# Patient Record
Sex: Male | Born: 1950 | Race: Black or African American | Hispanic: No | Smoking: Never smoker
Health system: Southern US, Community
[De-identification: ages and names within clinical notes are randomized; demographics above are authoritative.]

## PROBLEM LIST (undated history)

## (undated) DIAGNOSIS — D649 Anemia, unspecified: Secondary | ICD-10-CM

## (undated) DIAGNOSIS — I10 Essential (primary) hypertension: Secondary | ICD-10-CM

## (undated) DIAGNOSIS — E119 Type 2 diabetes mellitus without complications: Secondary | ICD-10-CM

## (undated) DIAGNOSIS — N189 Chronic kidney disease, unspecified: Secondary | ICD-10-CM

## (undated) HISTORY — PX: AV FISTULA PLACEMENT: SHX1204

## (undated) HISTORY — PX: CYSTECTOMY: SUR359

---

## 2009-08-01 ENCOUNTER — Ambulatory Visit (HOSPITAL_COMMUNITY): Admission: RE | Admit: 2009-08-01 | Discharge: 2009-08-01 | Payer: Self-pay | Admitting: Vascular Surgery

## 2009-08-01 ENCOUNTER — Ambulatory Visit: Payer: Self-pay | Admitting: Vascular Surgery

## 2009-09-13 ENCOUNTER — Ambulatory Visit: Payer: Self-pay | Admitting: Vascular Surgery

## 2009-09-22 ENCOUNTER — Ambulatory Visit (HOSPITAL_COMMUNITY): Admission: RE | Admit: 2009-09-22 | Discharge: 2009-09-22 | Payer: Self-pay | Admitting: Vascular Surgery

## 2009-09-22 ENCOUNTER — Ambulatory Visit: Payer: Self-pay | Admitting: Vascular Surgery

## 2009-11-22 ENCOUNTER — Ambulatory Visit: Payer: Self-pay | Admitting: Vascular Surgery

## 2009-11-29 ENCOUNTER — Ambulatory Visit (HOSPITAL_COMMUNITY): Admission: RE | Admit: 2009-11-29 | Discharge: 2009-11-29 | Payer: Self-pay | Admitting: Vascular Surgery

## 2009-11-29 ENCOUNTER — Ambulatory Visit: Payer: Self-pay | Admitting: Vascular Surgery

## 2010-01-24 ENCOUNTER — Ambulatory Visit (HOSPITAL_COMMUNITY): Admission: RE | Admit: 2010-01-24 | Discharge: 2010-01-24 | Payer: Self-pay | Admitting: Nephrology

## 2010-06-16 LAB — POCT I-STAT 4, (NA,K, GLUC, HGB,HCT)
Glucose, Bld: 136 mg/dL — ABNORMAL HIGH (ref 70–99)
HCT: 36 % — ABNORMAL LOW (ref 39.0–52.0)
Hemoglobin: 12.2 g/dL — ABNORMAL LOW (ref 13.0–17.0)
Potassium: 3.4 mEq/L — ABNORMAL LOW (ref 3.5–5.1)
Sodium: 140 mEq/L (ref 135–145)

## 2010-06-18 LAB — SURGICAL PCR SCREEN
MRSA, PCR: NEGATIVE
Staphylococcus aureus: NEGATIVE

## 2010-06-18 LAB — GLUCOSE, CAPILLARY: Glucose-Capillary: 81 mg/dL (ref 70–99)

## 2010-06-20 LAB — GLUCOSE, CAPILLARY
Glucose-Capillary: 76 mg/dL (ref 70–99)
Glucose-Capillary: 77 mg/dL (ref 70–99)
Glucose-Capillary: 83 mg/dL (ref 70–99)
Glucose-Capillary: 86 mg/dL (ref 70–99)

## 2010-08-15 NOTE — Assessment & Plan Note (Signed)
OFFICE VISIT   Thomas Ruiz, Thomas Ruiz  DOB:  October 12, 1950                                       11/22/2009  [id number]   The patient returns today for followup of his left upper arm AV fistula  created by Dr. Oneida Alar on June 23 for end-stage renal disease.  The  fistula has an excellent pulse and palpable thrill with a well-healed  antecubital incision.  There are no symptoms of steal distally.   On examination, the vein is dilating nicely and is palpable up to the  shoulder level.  There is a very prominent side branch about 2-3 cm  proximal to the antecubital incision, extending laterally down the  forearm.  By compressing this the pulsation in the fistula definitely  increases, as does the audible flow.  I think this branch should be  ligated to maximize the chance of maturity and so the fistula can be  utilized toward the end of September.  We scheduled this side branch  ligation to be done by Dr. Oneida Alar on Tuesday, August 30, under local  anesthesia.     Nelda Severe Kellie Simmering, M.D.  Electronically Signed   JDL/MEDQ  D:  11/22/2009  T:  11/23/2009  Job:  [id number]

## 2010-08-15 NOTE — Consult Note (Signed)
NEW PATIENT CONSULTATION   Thomas Ruiz, Thomas Ruiz  DOB:  1950-11-27                                       09/13/2009  [name]   The patient is a 60 year old male patient with chronic renal  insufficiency currently on hemodialysis on Monday, Wednesday and Friday  who presents for vascular access.  He is right-handed.  Thomas. Lowanda Foster is  his cardiologist.  He had a dialysis catheter inserted by Thomas. Donnetta Hutching on  May 2 of this year.   CHRONIC MEDICAL PROBLEMS:  1. Type 1 diabetes mellitus for 30 years.  2. Hypertension.  3. Hyperlipidemia.  4. Chronic renal insufficiency on hemodialysis.  5. Secondary hyperparathyroidism.  6. Negative for coronary artery disease, COPD or stroke.   FAMILY HISTORY:  Positive for diabetes in a brother.  Negative for  coronary artery disease and stroke.   SOCIAL HISTORY:  He is single and retired.  He does not use tobacco or  alcohol.   REVIEW OF SYSTEMS:  Positive for anorexia, weight loss, chronic kidney  disease.  Denies any cardiac, pulmonary, GI or neurologic symptoms.  No  musculoskeletal symptoms.  Also other systems in review of systems were  negative.   PHYSICAL EXAMINATION:  Blood pressure 162/85, heart rate 71, temperature  97.9.  Generally he is a well-developed, well-nourished male in no  apparent distress, alert and oriented times 3.  HEENT exam normal.  EOMs  intact.  Lungs clear to auscultation.  No rhonchi or wheezing.  Cardiovascular exam is a regular rhythm.  No murmurs.  Carotid pulses  3+, no bruits audible.  Abdomen soft, nontender with no masses.  Musculoskeletal exam is free of major deformities.  Neurologic exam is  normal.  There is no numbness or tingling.  Skin is free of rashes.  Upper extremity exam reveals 3+ brachial and radial pulses bilaterally.  Cephalic veins are not large but do seem adequate in the left upper arm.   Today I ordered bilateral vein mapping which I reviewed and interpreted.  The  best vein is the left upper arm cephalic vein which varies between  0.47 and 0.25.   I have scheduled him for left upper arm AV fistula on Thursday June 23  as an outpatient.  Hopefully this will provide a satisfactory site for  vascular access for this nice man.     Thomas Ruiz, M.D.  Electronically Signed   JDL/MEDQ  D:  09/13/2009  T:  09/14/2009  Job:  [id number]   cc:   Thomas Ruiz, M.D.  Thomas Ruiz

## 2010-08-15 NOTE — Procedures (Signed)
CEPHALIC VEIN MAPPING   INDICATION:  AV fistula placement.   HISTORY:  Kidney disease.   EXAM:  The right cephalic vein and basilic veins are compressible.   Diameter measurements range from 0.32 to [id number] in cephalic vein and A999333  to [id number] in basilic vein.   The left cephalic vein and basilic veins are compressible.   Diameter measurements range from 0.47 to 0000000 in cephalic vein and A999333  to [id number] in basilic vein.   See attached worksheet for all measurements.   IMPRESSION:  Patent right and left cephalic and basilic veins with  diameters noted above.   ___________________________________________  Nelda Severe. Kellie Simmering, M.D.   NT/MEDQ  D:  09/13/2009  T:  09/13/2009  Job:  [id number]

## 2011-02-08 ENCOUNTER — Other Ambulatory Visit (HOSPITAL_COMMUNITY): Payer: Self-pay | Admitting: Nephrology

## 2011-02-08 DIAGNOSIS — N186 End stage renal disease: Secondary | ICD-10-CM

## 2011-02-16 ENCOUNTER — Other Ambulatory Visit (HOSPITAL_COMMUNITY): Payer: Self-pay | Admitting: Nephrology

## 2011-02-16 ENCOUNTER — Ambulatory Visit: Payer: Self-pay | Admitting: Vascular Surgery

## 2011-02-16 DIAGNOSIS — N186 End stage renal disease: Secondary | ICD-10-CM

## 2011-02-19 ENCOUNTER — Ambulatory Visit (HOSPITAL_COMMUNITY): Admission: RE | Admit: 2011-02-19 | Payer: Self-pay | Source: Ambulatory Visit

## 2011-02-27 ENCOUNTER — Ambulatory Visit (HOSPITAL_COMMUNITY): Payer: Self-pay

## 2011-07-24 ENCOUNTER — Ambulatory Visit: Payer: Self-pay | Admitting: Vascular Surgery

## 2011-07-24 LAB — BASIC METABOLIC PANEL
Calcium, Total: 9.5 mg/dL (ref 8.5–10.1)
Creatinine: 8.89 mg/dL — ABNORMAL HIGH (ref 0.60–1.30)
EGFR (African American): 7 — ABNORMAL LOW
Glucose: 165 mg/dL — ABNORMAL HIGH (ref 65–99)
Osmolality: 287 (ref 275–301)
Potassium: 4 mmol/L (ref 3.5–5.1)
Sodium: 136 mmol/L (ref 136–145)

## 2014-07-25 NOTE — Op Note (Signed)
PATIENT NAME:  Thomas Ruiz, Thomas Ruiz MR#:  [id number] DATE OF BIRTH:  18-Sep-1950  DATE OF PROCEDURE:  07/24/2011  PREOPERATIVE DIAGNOSIS:  1. Poorly functioning dialysis access.  2. End-stage renal disease requiring hemodialysis.   POSTOPERATIVE DIAGNOSIS:  1. Poorly functioning dialysis access.  2. End-stage renal disease requiring hemodialysis.   PROCEDURE PERFORMED:  1. Brachiocephalic fistulogram.  2. Percutaneous transluminal angioplasty and stent placement left brachiocephalic fistula secondary to failed angioplasty.   SURGEON: Katha Cabal, MD   SEDATION: Versed 3 mg plus fentanyl 100 mcg administered IV. Continuous ECG, pulse oximetry and cardiopulmonary monitoring was performed throughout the entire procedure by the Interventional Radiology nurse. Total sedation time is 45 minutes.   ACCESS: A 7-French sheath, antegrade direction, left arm brachiocephalic fistula.   CONTRAST USED: Isovue 30 mL.   FLUOROSCOPY TIME:  3.6 minutes.   INDICATIONS: Thomas Ruiz is a 64 year old gentleman maintained on hemodialysis via a left arm brachiocephalic fistula who has been having increasing problems with bleeding post decannulation as well as worsening KT/v and poor dialysis runs. Physical examination as well as noninvasive studies demonstrated a more proximal stricture, and he is undergoing angiography to evaluate this with the intention for treatment. The risks and benefits were reviewed. All questions are answered. The patient agrees to proceed.   DESCRIPTION OF PROCEDURE: The patient is taken to Special Procedures and placed in the supine position with his left arm extended palm upward. The left arm is prepped and draped in a sterile fashion. After adequate sedation has been achieved, 1% lidocaine is infiltrated in the soft tissues overlying the fistula near the arterial anastomosis; and in an antegrade direction, a micropuncture needle is inserted, a microwire followed by micro sheath, J-wire  followed by a 6-French  sheath is inserted. Hand injection of contrast is then utilized to demonstrate the fistula, and a long segment approximately 6 cm string sign is identified within the cephalic vein just proximal to the venous cannulation site.   Heparin 3000 units is given, and a Magic Torque Wire is advanced through the stenosis, and a 7 x 4 Conquest balloon is advanced across the lesion. Two separate inflations are required to completely cross this lesion; both inflations are to 24 atmospheres. Although the balloon expanded to its profile, follow-up angiography demonstrated only minimal luminal gain. This represented a complete failure of stand-alone angioplasty, and therefore a 9 x 60 Fluency stent is opened onto the field. The sheath is removed, and over the wire the Fluency stent is advanced.  It is then opened across this lesion and postdilated using an 8 x 6 Rival balloon. Follow-up angiography demonstrated a focal area with greater than 30% residual stenosis and a small area at the more proximal margin of the stent which still appeared undersized as well. Therefore, a 9 x 2 Conquest balloon is utilized to 34 atmospheres for one minute. Follow-up angiography demonstrates complete resolution of the stenotic area and now a better profile to the proximal margin of the stent. With the 9 x 2 balloon inflated, reflux of contrast was noted in the arterial anastomosis, and visualized portions of brachial artery are widely patent. More central runoff, including the more proximal cephalic vein, the cephalic vein confluence, the subclavian vein and central veins are all imaged at this time as well. A pursestring suture of 4-0 Monocryl is placed. The sheath is removed, and there are no immediate complications.   INTERPRETATION: Initial views of the fistula itself demonstrate a long segment string sign just proximal  to the cannulation site. This is inadequately treated with stand-alone angioplasty, and  therefore a 9 x 6 Fluency stent is deployed across this lesion and postdilated to 9 mm. Follow-up angiography demonstrates complete resolution of the stenotic area. Reflux images demonstrate patency of the arterial portion, and the confluence of the cephalic vein as well as the central veins are all widely patent.   SUMMARY: Successful salvage of left arm brachiocephalic fistula with placement of a Fluency stent.  ____________________________ Katha Cabal, MD ggs:cbb D: 07/24/2011 17:08:00 ET T: 07/24/2011 18:09:00 ET JOB#: [id number] Thomas Lory SCHNIER MD ELECTRONICALLY SIGNED 08/10/2011 7:51

## 2014-08-24 ENCOUNTER — Other Ambulatory Visit: Payer: Self-pay | Admitting: Podiatry

## 2014-08-24 ENCOUNTER — Encounter (HOSPITAL_COMMUNITY): Payer: Self-pay

## 2014-08-24 ENCOUNTER — Ambulatory Visit (HOSPITAL_COMMUNITY)
Admission: RE | Admit: 2014-08-24 | Discharge: 2014-08-24 | Disposition: A | Discharge status: Home / Self Care | Payer: Medicare Other | Source: Ambulatory Visit | Attending: Podiatry | Admitting: Podiatry

## 2014-08-24 ENCOUNTER — Encounter (HOSPITAL_COMMUNITY)
Admission: RE | Admit: 2014-08-24 | Discharge: 2014-08-24 | Disposition: A | Discharge status: Home / Self Care | Payer: Medicare Other | Source: Ambulatory Visit | Attending: Podiatry | Admitting: Podiatry

## 2014-08-24 DIAGNOSIS — M2011 Hallux valgus (acquired), right foot: Secondary | ICD-10-CM | POA: Diagnosis not present

## 2014-08-24 DIAGNOSIS — I70208 Unspecified atherosclerosis of native arteries of extremities, other extremity: Secondary | ICD-10-CM | POA: Diagnosis not present

## 2014-08-24 DIAGNOSIS — Z01818 Encounter for other preprocedural examination: Secondary | ICD-10-CM | POA: Diagnosis present

## 2014-08-24 DIAGNOSIS — Z419 Encounter for procedure for purposes other than remedying health state, unspecified: Secondary | ICD-10-CM

## 2014-08-24 HISTORY — DX: Anemia, unspecified: D64.9

## 2014-08-24 HISTORY — DX: Chronic kidney disease, unspecified: N18.9

## 2014-08-24 HISTORY — DX: Essential (primary) hypertension: I10

## 2014-08-24 HISTORY — DX: Type 2 diabetes mellitus without complications: E11.9

## 2014-08-24 NOTE — Pre-Procedure Instructions (Signed)
Patient given information to sign up for my chart at home. 

## 2014-08-24 NOTE — Patient Instructions (Addendum)
Your procedure is scheduled on: 08/26/2014  Report to [facility] at   11:40  AM.  Call this number if you have problems the morning of surgery: [phone]   Remember:   Do not drink or eat food:After Midnight.  :  Take these medicines the morning of surgery with A SIP OF WATER: blood pressure pill. Take 1/2 your usual insulin dosage the night before your surgery and nothing for diabetes the morning of surgery.   Do not wear jewelry, make-up or nail polish.  Do not wear lotions, powders, or perfumes.   Do not shave 48 hours prior to surgery. Men may shave face and neck.  Do not bring valuables to the hospital.  Contacts, dentures or bridgework may not be worn into surgery.  Leave suitcase in the car. After surgery it may be brought to your room.  For patients admitted to the hospital, checkout time is 11:00 AM the day of discharge.   Patients discharged the day of surgery will not be allowed to drive home.    Special Instructions: Shower using CHG night before surgery and shower the day of surgery use CHG.  Use special wash - you have one bottle of CHG for all showers.  You should use approximately 1/2 of the bottle for each shower.   Please read over the following fact sheets that you were given: Pain Booklet, MRSA Information, Surgical Site Infection Prevention and Care and Recovery After Surgery  Toe Injuries and Amputations You have cut off (amputated) part of your toe. Your outcome depends largely on how much was amputated. If just the tip is amputated, often the end of the toe will grow back and the toe may return much to the same as it was before the injury. If more of the toe is missing, your caregiver has done the best with the tissue remaining to allow you to keep as much toe as is possible or has finished the amputation at a level that will leave you with the most functional toe. This means a toe that will work the best for you. Please read the instructions outlined below and  refer to this sheet in the next few weeks. These instructions provide you with general information on caring for yourself. Your caregiver may also give you specific instructions. While your treatment has been done according to the most current medical practices available, unavoidable complications occasionally occur. If you have any problems or questions after discharge, call your caregiver. HOME CARE INSTRUCTIONS   You may resume a normal diet and activities as directed or allowed.  Keep your foot elevated when possible. This helps decrease pain and swelling.  Keep ice packs (a bag of ice wrapped in a towel) on the injured area for 15-20 minutes, 03-04 times per day, for the first two days. Use ice only if OK with your caregiver.  Change dressings if necessary or as directed.  Clean the wounded area as directed.  Only take over-the-counter or prescription medicines for pain, discomfort, or fever as directed by your caregiver.  Keep appointments as directed. SEEK IMMEDIATE MEDICAL CARE IF:  There is redness, swelling, numbness or increasing pain in the wound.  There is pus coming from wound.  You have an unexplained oral temperature above 102 F (38.9 C) or as your caregiver suggests.  There is a bad (foul) smell coming from the wound or dressing.  The edges of the wound break open (the edges are not staying together) after sutures or  staples have been removed. Document Released: 02/07/2005 Document Revised: 06/11/2011 Document Reviewed: 07/07/2008 [facility] Patient Information 2015 [place], [place]. This information is not intended to replace advice given to you by your health care provider. Make sure you discuss any questions you have with your health care provider. Monitored Anesthesia Care Monitored anesthesia care is an anesthesia service for a medical procedure. Anesthesia is the loss of the ability to feel pain. It is produced by medicines called anesthetics. It may affect a small  area of your body (local anesthesia), a large area of your body (regional anesthesia), or your entire body (general anesthesia). The need for monitored anesthesia care depends your procedure, your condition, and the potential need for regional or general anesthesia. It is often provided during procedures where:   General anesthesia may be needed if there are complications. This is because you need special care when you are under general anesthesia.   You will be under local or regional anesthesia. This is so that you are able to have higher levels of anesthesia if needed.   You will receive calming medicines (sedatives). This is especially the case if sedatives are given to put you in a semi-conscious state of relaxation (deep sedation). This is because the amount of sedative needed to produce this state can be hard to predict. Too much of a sedative can produce general anesthesia. Monitored anesthesia care is performed by one or more health care providers who have special training in all types of anesthesia. You will need to meet with these health care providers before your procedure. During this meeting, they will ask you about your medical history. They will also give you instructions to follow. (For example, you will need to stop eating and drinking before your procedure. You may also need to stop or change medicines you are taking.) During your procedure, your health care providers will stay with you. They will:   Watch your condition. This includes watching your blood pressure, breathing, and level of pain.   Diagnose and treat problems that occur.   Give medicines if they are needed. These may include calming medicines (sedatives) and anesthetics.   Make sure you are comfortable.  Having monitored anesthesia care does not necessarily mean that you will be under anesthesia. It does mean that your health care providers will be able to manage anesthesia if you need it or if it occurs. It  also means that you will be able to have a different type of anesthesia than you are having if you need it. When your procedure is complete, your health care providers will continue to watch your condition. They will make sure any medicines wear off before you are allowed to go home.  Document Released: 12/13/2004 Document Revised: 08/03/2013 Document Reviewed: 04/30/2012 [facility] Patient Information 2015 [place], [place]. This information is not intended to replace advice given to you by your health care provider. Make sure you discuss any questions you have with your health care provider.

## 2014-08-25 MED ORDER — NEOMYCIN-POLYMYXIN-DEXAMETH 3.5-10000-0.1 OP SUSP
OPHTHALMIC | Status: AC
  Filled 2014-08-25: qty 5

## 2014-08-25 MED ORDER — LIDOCAINE HCL (PF) 1 % IJ SOLN
INTRAMUSCULAR | Status: AC
  Filled 2014-08-25: qty 2

## 2014-08-25 MED ORDER — LIDOCAINE HCL 3.5 % OP GEL
OPHTHALMIC | Status: AC
  Filled 2014-08-25: qty 1

## 2014-08-25 MED ORDER — CYCLOPENTOLATE-PHENYLEPHRINE OP SOLN OPTIME - NO CHARGE
OPHTHALMIC | Status: AC
  Filled 2014-08-25: qty 2

## 2014-08-25 MED ORDER — TETRACAINE HCL 0.5 % OP SOLN
OPHTHALMIC | Status: AC
  Filled 2014-08-25: qty 2

## 2014-08-25 MED ORDER — PHENYLEPHRINE HCL 2.5 % OP SOLN
OPHTHALMIC | Status: AC
  Filled 2014-08-25: qty 15

## 2014-08-26 ENCOUNTER — Ambulatory Visit (HOSPITAL_COMMUNITY): Payer: Medicare Other | Admitting: Anesthesiology

## 2014-08-26 ENCOUNTER — Encounter (HOSPITAL_COMMUNITY)
Admission: RE | Disposition: A | Discharge status: Home / Self Care | Payer: Self-pay | Source: Ambulatory Visit | Attending: Podiatry

## 2014-08-26 ENCOUNTER — Encounter (HOSPITAL_COMMUNITY): Payer: Self-pay | Admitting: *Deleted

## 2014-08-26 ENCOUNTER — Ambulatory Visit (HOSPITAL_COMMUNITY)
Admission: RE | Admit: 2014-08-26 | Discharge: 2014-08-26 | Disposition: A | Discharge status: Home / Self Care | Payer: Medicare Other | Source: Ambulatory Visit | Attending: Podiatry | Admitting: Podiatry

## 2014-08-26 DIAGNOSIS — Z992 Dependence on renal dialysis: Secondary | ICD-10-CM | POA: Diagnosis not present

## 2014-08-26 DIAGNOSIS — M868X7 Other osteomyelitis, ankle and foot: Secondary | ICD-10-CM | POA: Diagnosis not present

## 2014-08-26 DIAGNOSIS — E119 Type 2 diabetes mellitus without complications: Secondary | ICD-10-CM | POA: Insufficient documentation

## 2014-08-26 DIAGNOSIS — Z79899 Other long term (current) drug therapy: Secondary | ICD-10-CM | POA: Diagnosis not present

## 2014-08-26 DIAGNOSIS — I120 Hypertensive chronic kidney disease with stage 5 chronic kidney disease or end stage renal disease: Secondary | ICD-10-CM | POA: Diagnosis not present

## 2014-08-26 DIAGNOSIS — N186 End stage renal disease: Secondary | ICD-10-CM | POA: Diagnosis not present

## 2014-08-26 HISTORY — PX: AMPUTATION: SHX166

## 2014-08-26 LAB — POCT I-STAT, CHEM 8
BUN: 38 mg/dL — ABNORMAL HIGH (ref 6–20)
CALCIUM ION: 1.15 mmol/L (ref 1.13–1.30)
CHLORIDE: 100 mmol/L — AB (ref 101–111)
Creatinine, Ser: 9.5 mg/dL — ABNORMAL HIGH (ref 0.61–1.24)
GLUCOSE: 134 mg/dL — AB (ref 65–99)
HCT: 40 % (ref 39.0–52.0)
HEMOGLOBIN: 13.6 g/dL (ref 13.0–17.0)
POTASSIUM: 4.2 mmol/L (ref 3.5–5.1)
Sodium: 141 mmol/L (ref 135–145)
TCO2: 27 mmol/L (ref 0–100)

## 2014-08-26 SURGERY — AMPUTATION DIGIT
Anesthesia: Monitor Anesthesia Care | Site: Toe | Laterality: Right

## 2014-08-26 MED ORDER — LIDOCAINE HCL (PF) 1 % IJ SOLN
INTRAMUSCULAR | Status: AC
  Filled 2014-08-26: qty 30

## 2014-08-26 MED ORDER — 0.9 % SODIUM CHLORIDE (POUR BTL) OPTIME
TOPICAL | Status: DC | PRN
  Administered 2014-08-26: 1000 mL

## 2014-08-26 MED ORDER — FENTANYL CITRATE (PF) 100 MCG/2ML IJ SOLN
25.0000 ug | INTRAMUSCULAR | Status: AC
  Administered 2014-08-26 (×2): 25 ug via INTRAVENOUS

## 2014-08-26 MED ORDER — MIDAZOLAM HCL 2 MG/2ML IJ SOLN
INTRAMUSCULAR | Status: AC
  Filled 2014-08-26: qty 2

## 2014-08-26 MED ORDER — FENTANYL CITRATE (PF) 100 MCG/2ML IJ SOLN
INTRAMUSCULAR | Status: AC
  Filled 2014-08-26: qty 2

## 2014-08-26 MED ORDER — MIDAZOLAM HCL 2 MG/2ML IJ SOLN
1.0000 mg | INTRAMUSCULAR | Status: DC | PRN
  Administered 2014-08-26 (×2): 2 mg via INTRAVENOUS
  Filled 2014-08-26: qty 2

## 2014-08-26 MED ORDER — CEFAZOLIN SODIUM-DEXTROSE 2-3 GM-% IV SOLR
INTRAVENOUS | Status: AC
  Filled 2014-08-26: qty 50

## 2014-08-26 MED ORDER — LIDOCAINE HCL 1 % IJ SOLN
INTRAMUSCULAR | Status: DC | PRN
  Administered 2014-08-26 (×2): 10 mL

## 2014-08-26 MED ORDER — SODIUM CHLORIDE 0.9 % IV SOLN
INTRAVENOUS | Status: DC
  Administered 2014-08-26: 1000 mL via INTRAVENOUS

## 2014-08-26 MED ORDER — PROPOFOL INFUSION 10 MG/ML OPTIME
INTRAVENOUS | Status: DC | PRN
  Administered 2014-08-26: 75 ug/kg/min via INTRAVENOUS

## 2014-08-26 MED ORDER — CEFAZOLIN SODIUM-DEXTROSE 2-3 GM-% IV SOLR
2.0000 g | Freq: Once | INTRAVENOUS | Status: AC
  Administered 2014-08-26: 2 g via INTRAVENOUS

## 2014-08-26 MED ORDER — MIDAZOLAM HCL 5 MG/5ML IJ SOLN
INTRAMUSCULAR | Status: DC | PRN
Start: 2014-08-26 — End: 2014-08-26
  Administered 2014-08-26: 1 mg via INTRAVENOUS

## 2014-08-26 MED ORDER — PROPOFOL 10 MG/ML IV BOLUS
INTRAVENOUS | Status: AC
  Filled 2014-08-26: qty 20

## 2014-08-26 MED ORDER — BUPIVACAINE HCL (PF) 0.25 % IJ SOLN
INTRAMUSCULAR | Status: AC
  Filled 2014-08-26: qty 30

## 2014-08-26 MED ORDER — LACTATED RINGERS IV SOLN: INTRAVENOUS | Status: DC

## 2014-08-26 MED ORDER — FENTANYL CITRATE (PF) 100 MCG/2ML IJ SOLN
INTRAMUSCULAR | Status: DC | PRN
  Administered 2014-08-26: 25 ug via INTRAVENOUS

## 2014-08-26 SURGICAL SUPPLY — 40 items
BAG HAMPER (MISCELLANEOUS) ×3 IMPLANT
BANDAGE ELASTIC 4 VELCRO NS (GAUZE/BANDAGES/DRESSINGS) ×3 IMPLANT
BANDAGE ESMARK 4X12 BL STRL LF (DISPOSABLE) ×1 IMPLANT
BENZOIN TINCTURE PRP APPL 2/3 (GAUZE/BANDAGES/DRESSINGS) ×3 IMPLANT
BLADE SURG 15 STRL LF DISP TIS (BLADE) ×1 IMPLANT
BLADE SURG 15 STRL SS (BLADE) ×2
BNDG ESMARK 4X12 BLUE STRL LF (DISPOSABLE) ×3
BNDG GAUZE ELAST 4 BULKY (GAUZE/BANDAGES/DRESSINGS) ×3 IMPLANT
CLOSURE WOUND 1/4 X3 (GAUZE/BANDAGES/DRESSINGS) ×1
CLOTH BEACON ORANGE TIMEOUT ST (SAFETY) ×3 IMPLANT
COVER LIGHT HANDLE STERIS (MISCELLANEOUS) ×3 IMPLANT
CUFF TOURNIQUET SINGLE 18IN (TOURNIQUET CUFF) ×3 IMPLANT
DRSG XEROFORM 1X8 (GAUZE/BANDAGES/DRESSINGS) ×3 IMPLANT
ELECT REM PT RETURN 9FT ADLT (ELECTROSURGICAL) ×3
ELECTRODE REM PT RTRN 9FT ADLT (ELECTROSURGICAL) ×1 IMPLANT
FORMALIN 10 PREFIL 120ML (MISCELLANEOUS) ×3 IMPLANT
GAUZE SPONGE 4X4 12PLY STRL (GAUZE/BANDAGES/DRESSINGS) ×2 IMPLANT
GLOVE BIO SURGEON STRL SZ 6 (GLOVE) ×3 IMPLANT
GLOVE BIOGEL PI IND STRL 6 (GLOVE) ×1 IMPLANT
GLOVE BIOGEL PI IND STRL 6.5 (GLOVE) ×1 IMPLANT
GLOVE BIOGEL PI INDICATOR 6 (GLOVE) ×2
GLOVE BIOGEL PI INDICATOR 6.5 (GLOVE) ×2
GLOVE ECLIPSE 6.5 STRL STRAW (GLOVE) ×3 IMPLANT
GLOVE INDICATOR 7.0 STRL GRN (GLOVE) ×3 IMPLANT
GOWN STRL REUS W/TWL LRG LVL3 (GOWN DISPOSABLE) ×3 IMPLANT
KIT ROOM TURNOVER APOR (KITS) ×3 IMPLANT
MANIFOLD NEPTUNE II (INSTRUMENTS) ×3 IMPLANT
NEEDLE HYPO 27GX1-1/4 (NEEDLE) ×6 IMPLANT
NS IRRIG 1000ML POUR BTL (IV SOLUTION) ×3 IMPLANT
PACK BASIC LIMB (CUSTOM PROCEDURE TRAY) ×3 IMPLANT
PAD ARMBOARD 7.5X6 YLW CONV (MISCELLANEOUS) ×3 IMPLANT
SET BASIN LINEN APH (SET/KITS/TRAYS/PACK) ×3 IMPLANT
SPONGE GAUZE 4X4 12PLY (GAUZE/BANDAGES/DRESSINGS) ×3 IMPLANT
STRIP CLOSURE SKIN 1/4X3 (GAUZE/BANDAGES/DRESSINGS) ×2 IMPLANT
SUT ETHILON 3 0 FSL (SUTURE) ×3 IMPLANT
SUT ETHILON 4 0 PS 2 18 (SUTURE) IMPLANT
SUT VIC AB 4-0 PS2 27 (SUTURE) ×3 IMPLANT
SUT VICRYL AB 3-0 FS1 BRD 27IN (SUTURE) IMPLANT
SYR CONTROL 10ML LL (SYRINGE) ×3 IMPLANT
TOWEL OR 17X26 4PK STRL BLUE (TOWEL DISPOSABLE) ×3 IMPLANT

## 2014-08-26 NOTE — Discharge Instructions (Signed)
Please keep dressing clean, dry, intact and do not get wet.   Ice and elevate surgical extremity on top of foot or behind the knee for 30 minutes on and 30 minutes off.  Non weight bear to the right foot with aid of surgical shoe and walker.  Decrease weight bearing activity for only bathroom privileges.  Wear surgical shoe when going to the bathroom otherwise surgical shoe should be off if resting on chair/bed.   Incision Care An incision is when a surgeon cuts into your body tissues. After surgery, the incision needs to be cared for properly to prevent infection.  HOME CARE INSTRUCTIONS   Take all medicine as directed by your caregiver. Only take over-the-counter or prescription medicines for pain, discomfort, or fever as directed by your caregiver.  Do not remove your bandage (dressing) or get your incision wet until your surgeon gives you permission. In the event that your dressing becomes wet, dirty, or starts to smell, change the dressing and call your surgeon for instructions as soon as possible.  Take showers. Do not take tub baths, swim, or do anything that may soak the wound until it is healed.  Resume your normal diet and activities as directed or allowed.  Avoid lifting any weight until you are instructed otherwise.  Use anti-itch antihistamine medicine as directed by your caregiver. The wound may itch when it is healing. Do not pick or scratch at the wound.  Follow up with your caregiver for stitch (suture) or staple removal as directed.  Drink enough fluids to keep your urine clear or pale yellow. SEEK MEDICAL CARE IF:   You have redness, swelling, or increasing pain in the wound that is not controlled with medicine.  You have drainage, blood, or pus coming from the wound that lasts longer than 1 day.  You develop muscle aches, chills, or a general ill feeling.  You notice a bad smell coming from the wound or dressing.  Your wound edges separate after the sutures,  staples, or skin adhesive strips have been removed.  You develop persistent nausea or vomiting. SEEK IMMEDIATE MEDICAL CARE IF:   You have a fever.  You develop a rash.  You develop dizzy episodes or faint while standing.  You have difficulty breathing.  You develop any reaction or side effects to medicine given. MAKE SURE YOU:   Understand these instructions.  Will watch your condition.  Will get help right away if you are not doing well or get worse. Document Released: 10/06/2004 Document Revised: 06/11/2011 Document Reviewed: 05/13/2013 [facility] Patient Information 2015 [place], [place]. This information is not intended to replace advice given to you by your health care provider. Make sure you discuss any questions you have with your health care provider.

## 2014-08-26 NOTE — Anesthesia Postprocedure Evaluation (Signed)
  Anesthesia Post-op Note  Patient: Thomas Ruiz  Procedure(s) Performed: Procedure(s): AMPUTATION DIGIT 4TH TOE RIGHT FOOT (Right)  Patient Location: PACU  Anesthesia Type:MAC  Level of Consciousness: awake, alert , oriented and patient cooperative  Airway and Oxygen Therapy: Patient Spontanous Breathing  Post-op Pain: none  Post-op Assessment: Post-op Vital signs reviewed, Patient's Cardiovascular Status Stable, Respiratory Function Stable, Patent Airway and Pain level controlled  Post-op Vital Signs: Reviewed and stable  Last Vitals:  Filed Vitals:   08/26/14 1336  BP: 123/72  Pulse: 54  Temp:   Resp: 0    Complications: No apparent anesthesia complications

## 2014-08-26 NOTE — Anesthesia Preprocedure Evaluation (Addendum)
Anesthesia Evaluation  Patient identified by MRN, date of birth, ID band Patient awake    Reviewed: Allergy & Precautions, NPO status , Patient's Chart, lab work & pertinent test results  Airway Mallampati: II  TM Distance: >3 FB     Dental  (+) Teeth Intact   Pulmonary neg pulmonary ROS,  breath sounds clear to auscultation        Cardiovascular hypertension, Pt. on medications Rhythm:Regular Rate:Normal     Neuro/Psych    GI/Hepatic   Endo/Other  diabetes, Type 2, Oral Hypoglycemic Agents  Renal/GU Dialysis and ESRFRenal disease     Musculoskeletal   Abdominal   Peds  Hematology  (+) anemia ,   Anesthesia Other Findings   Reproductive/Obstetrics                           Anesthesia Physical Anesthesia Plan  ASA: III  Anesthesia Plan: MAC   Post-op Pain Management:    Induction: Intravenous  Airway Management Planned: Nasal Cannula  Additional Equipment:   Intra-op Plan:   Post-operative Plan:   Informed Consent: I have reviewed the patients History and Physical, chart, labs and discussed the procedure including the risks, benefits and alternatives for the proposed anesthesia with the patient or authorized representative who has indicated his/her understanding and acceptance.     Plan Discussed with:   Anesthesia Plan Comments:         Anesthesia Quick Evaluation

## 2014-08-26 NOTE — Transfer of Care (Signed)
Immediate Anesthesia Transfer of Care Note  Patient: Thomas Ruiz  Procedure(s) Performed: Procedure(s): AMPUTATION DIGIT 4TH TOE RIGHT FOOT (Right)  Patient Location: PACU  Anesthesia Type:MAC  Level of Consciousness: awake, alert , oriented and patient cooperative  Airway & Oxygen Therapy: Patient Spontanous Breathing  Post-op Assessment: Report given to RN, Post -op Vital signs reviewed and stable and Patient moving all extremities  Post vital signs: Reviewed and stable  Last Vitals:  Filed Vitals:   08/26/14 1230  BP: 120/75  Temp:   Resp: 13    Complications: No apparent anesthesia complications

## 2014-08-26 NOTE — H&P (Signed)
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  The various methods of treatment have been discussed with the patient.  No guarantees were given.  After consideration of risks, benefits and other options for treatment, the patient has consented to surgery.  I have reviewed the patients' chart and labs.    Patient Vitals for the past 24 hrs:  BP Temp Temp src Resp SpO2 Height Weight  08/26/14 1225 114/73 mmHg - - 17 97 % - -  08/26/14 1220 128/78 mmHg - - 11 97 % - -  08/26/14 1219 - - - - - 6' (1.829 m) 91.173 kg (201 lb)  08/26/14 1215 130/76 mmHg - - (!) 22 99 % - -  08/26/14 1210 (!) 143/78 mmHg - - 18 99 % - -  08/26/14 1205 (!) 147/80 mmHg - - (!) 69 100 % - -  08/26/14 1200 (!) 147/83 mmHg - - 13 99 % - -  08/26/14 1155 - - - 17 99 % - -  08/26/14 1153 - 98 F (36.7 C) Oral - - - -    A history and physical examination was performed in my office.  The patient was reexamined.  There have been no changes to this history and physical examination.  Juanita Laster, DPM

## 2014-08-26 NOTE — Op Note (Signed)
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  A pneumatic ankle tourniquet was applied to the patient's ankle.  Following sedation, the surgical site was anesthetized with 10 mL of a one-to-one mix of 1% lidocaine plain and 0.25% Marcaine plain.  The foot was then prepped, scrubbed, and draped in the usual sterile technique.  The foot was elevated, exsanguinated and the pneumatic ankle tourniquet inflated to 250 mmHg.    Attention was directed to the right fourth toe where an ulceration is noted along the lateral aspect of the toe. A fishmouth incision was made from skin to bone. The right fourth toe was disarticulated at the metatarsophalangeal joint, removed from the operative field, and sent to pathology.   The right fourth metatarsal head appeared healthy and viable.  The wound was then irrigated with copious amounts of normal sterile saline. The skin was reapproximated using 3-0 nylon with vertical mattress and simple interrupted suture techniques. A postop injection consisting of 10 mL of a one-to-one mix of 1% lidocaine plain and 0.25% Marcaine plain was injected. The incision was then dressed with Xeroform, 4 x 4's, Kerlix, and  Ace bandage. The pneumatic ankle tourniquet was deflated and a prompt hyperemic response was noted to all remaining digits of the right foot. The patient tolerated the procedure and anesthesia well. He was transferred to the PACU with vital signs stable and neurovascular status at preoperative's line to all remaining toes of the right foot. Following a period of postoperative monitoring the patient will be discharged home on written and oral postoperative instructions.

## 2014-08-27 ENCOUNTER — Encounter (HOSPITAL_COMMUNITY): Payer: Self-pay | Admitting: Podiatry

## 2015-04-04 DIAGNOSIS — N2581 Secondary hyperparathyroidism of renal origin: Secondary | ICD-10-CM | POA: Diagnosis not present

## 2015-04-04 DIAGNOSIS — N186 End stage renal disease: Secondary | ICD-10-CM | POA: Diagnosis not present

## 2015-04-04 DIAGNOSIS — D509 Iron deficiency anemia, unspecified: Secondary | ICD-10-CM | POA: Diagnosis not present

## 2015-04-04 DIAGNOSIS — D631 Anemia in chronic kidney disease: Secondary | ICD-10-CM | POA: Diagnosis not present

## 2015-04-04 DIAGNOSIS — Z992 Dependence on renal dialysis: Secondary | ICD-10-CM | POA: Diagnosis not present

## 2015-04-06 DIAGNOSIS — N186 End stage renal disease: Secondary | ICD-10-CM | POA: Diagnosis not present

## 2015-04-06 DIAGNOSIS — Z992 Dependence on renal dialysis: Secondary | ICD-10-CM | POA: Diagnosis not present

## 2015-04-06 DIAGNOSIS — D631 Anemia in chronic kidney disease: Secondary | ICD-10-CM | POA: Diagnosis not present

## 2015-04-06 DIAGNOSIS — D509 Iron deficiency anemia, unspecified: Secondary | ICD-10-CM | POA: Diagnosis not present

## 2015-04-06 DIAGNOSIS — N2581 Secondary hyperparathyroidism of renal origin: Secondary | ICD-10-CM | POA: Diagnosis not present

## 2015-04-07 DIAGNOSIS — E1142 Type 2 diabetes mellitus with diabetic polyneuropathy: Secondary | ICD-10-CM | POA: Diagnosis not present

## 2015-04-07 DIAGNOSIS — L851 Acquired keratosis [keratoderma] palmaris et plantaris: Secondary | ICD-10-CM | POA: Diagnosis not present

## 2015-04-07 DIAGNOSIS — B351 Tinea unguium: Secondary | ICD-10-CM | POA: Diagnosis not present

## 2015-04-08 DIAGNOSIS — D631 Anemia in chronic kidney disease: Secondary | ICD-10-CM | POA: Diagnosis not present

## 2015-04-08 DIAGNOSIS — N2581 Secondary hyperparathyroidism of renal origin: Secondary | ICD-10-CM | POA: Diagnosis not present

## 2015-04-08 DIAGNOSIS — Z992 Dependence on renal dialysis: Secondary | ICD-10-CM | POA: Diagnosis not present

## 2015-04-08 DIAGNOSIS — N186 End stage renal disease: Secondary | ICD-10-CM | POA: Diagnosis not present

## 2015-04-08 DIAGNOSIS — D509 Iron deficiency anemia, unspecified: Secondary | ICD-10-CM | POA: Diagnosis not present

## 2015-04-11 DIAGNOSIS — N2581 Secondary hyperparathyroidism of renal origin: Secondary | ICD-10-CM | POA: Diagnosis not present

## 2015-04-11 DIAGNOSIS — Z992 Dependence on renal dialysis: Secondary | ICD-10-CM | POA: Diagnosis not present

## 2015-04-11 DIAGNOSIS — D631 Anemia in chronic kidney disease: Secondary | ICD-10-CM | POA: Diagnosis not present

## 2015-04-11 DIAGNOSIS — N186 End stage renal disease: Secondary | ICD-10-CM | POA: Diagnosis not present

## 2015-04-11 DIAGNOSIS — D509 Iron deficiency anemia, unspecified: Secondary | ICD-10-CM | POA: Diagnosis not present

## 2015-04-13 DIAGNOSIS — Z992 Dependence on renal dialysis: Secondary | ICD-10-CM | POA: Diagnosis not present

## 2015-04-13 DIAGNOSIS — N2581 Secondary hyperparathyroidism of renal origin: Secondary | ICD-10-CM | POA: Diagnosis not present

## 2015-04-13 DIAGNOSIS — D509 Iron deficiency anemia, unspecified: Secondary | ICD-10-CM | POA: Diagnosis not present

## 2015-04-13 DIAGNOSIS — D631 Anemia in chronic kidney disease: Secondary | ICD-10-CM | POA: Diagnosis not present

## 2015-04-13 DIAGNOSIS — N186 End stage renal disease: Secondary | ICD-10-CM | POA: Diagnosis not present

## 2015-04-14 DIAGNOSIS — Z1211 Encounter for screening for malignant neoplasm of colon: Secondary | ICD-10-CM | POA: Diagnosis not present

## 2015-04-15 DIAGNOSIS — N2581 Secondary hyperparathyroidism of renal origin: Secondary | ICD-10-CM | POA: Diagnosis not present

## 2015-04-15 DIAGNOSIS — Z992 Dependence on renal dialysis: Secondary | ICD-10-CM | POA: Diagnosis not present

## 2015-04-15 DIAGNOSIS — D509 Iron deficiency anemia, unspecified: Secondary | ICD-10-CM | POA: Diagnosis not present

## 2015-04-15 DIAGNOSIS — D631 Anemia in chronic kidney disease: Secondary | ICD-10-CM | POA: Diagnosis not present

## 2015-04-15 DIAGNOSIS — N186 End stage renal disease: Secondary | ICD-10-CM | POA: Diagnosis not present

## 2015-04-18 DIAGNOSIS — Z992 Dependence on renal dialysis: Secondary | ICD-10-CM | POA: Diagnosis not present

## 2015-04-18 DIAGNOSIS — N2581 Secondary hyperparathyroidism of renal origin: Secondary | ICD-10-CM | POA: Diagnosis not present

## 2015-04-18 DIAGNOSIS — D509 Iron deficiency anemia, unspecified: Secondary | ICD-10-CM | POA: Diagnosis not present

## 2015-04-18 DIAGNOSIS — D631 Anemia in chronic kidney disease: Secondary | ICD-10-CM | POA: Diagnosis not present

## 2015-04-18 DIAGNOSIS — N186 End stage renal disease: Secondary | ICD-10-CM | POA: Diagnosis not present

## 2015-04-19 DIAGNOSIS — Z8249 Family history of ischemic heart disease and other diseases of the circulatory system: Secondary | ICD-10-CM | POA: Diagnosis not present

## 2015-04-19 DIAGNOSIS — E1122 Type 2 diabetes mellitus with diabetic chronic kidney disease: Secondary | ICD-10-CM | POA: Diagnosis not present

## 2015-04-19 DIAGNOSIS — Z803 Family history of malignant neoplasm of breast: Secondary | ICD-10-CM | POA: Diagnosis not present

## 2015-04-19 DIAGNOSIS — Z833 Family history of diabetes mellitus: Secondary | ICD-10-CM | POA: Diagnosis not present

## 2015-04-19 DIAGNOSIS — Z1211 Encounter for screening for malignant neoplasm of colon: Secondary | ICD-10-CM | POA: Diagnosis not present

## 2015-04-19 DIAGNOSIS — Z79899 Other long term (current) drug therapy: Secondary | ICD-10-CM | POA: Diagnosis not present

## 2015-04-19 DIAGNOSIS — N186 End stage renal disease: Secondary | ICD-10-CM | POA: Diagnosis not present

## 2015-04-19 DIAGNOSIS — I120 Hypertensive chronic kidney disease with stage 5 chronic kidney disease or end stage renal disease: Secondary | ICD-10-CM | POA: Diagnosis not present

## 2015-04-19 DIAGNOSIS — Z794 Long term (current) use of insulin: Secondary | ICD-10-CM | POA: Diagnosis not present

## 2015-04-20 DIAGNOSIS — Z992 Dependence on renal dialysis: Secondary | ICD-10-CM | POA: Diagnosis not present

## 2015-04-20 DIAGNOSIS — D509 Iron deficiency anemia, unspecified: Secondary | ICD-10-CM | POA: Diagnosis not present

## 2015-04-20 DIAGNOSIS — N186 End stage renal disease: Secondary | ICD-10-CM | POA: Diagnosis not present

## 2015-04-20 DIAGNOSIS — D631 Anemia in chronic kidney disease: Secondary | ICD-10-CM | POA: Diagnosis not present

## 2015-04-20 DIAGNOSIS — N2581 Secondary hyperparathyroidism of renal origin: Secondary | ICD-10-CM | POA: Diagnosis not present

## 2015-04-22 DIAGNOSIS — D631 Anemia in chronic kidney disease: Secondary | ICD-10-CM | POA: Diagnosis not present

## 2015-04-22 DIAGNOSIS — D509 Iron deficiency anemia, unspecified: Secondary | ICD-10-CM | POA: Diagnosis not present

## 2015-04-22 DIAGNOSIS — N186 End stage renal disease: Secondary | ICD-10-CM | POA: Diagnosis not present

## 2015-04-22 DIAGNOSIS — Z992 Dependence on renal dialysis: Secondary | ICD-10-CM | POA: Diagnosis not present

## 2015-04-22 DIAGNOSIS — N2581 Secondary hyperparathyroidism of renal origin: Secondary | ICD-10-CM | POA: Diagnosis not present

## 2015-04-25 DIAGNOSIS — Z992 Dependence on renal dialysis: Secondary | ICD-10-CM | POA: Diagnosis not present

## 2015-04-25 DIAGNOSIS — D631 Anemia in chronic kidney disease: Secondary | ICD-10-CM | POA: Diagnosis not present

## 2015-04-25 DIAGNOSIS — N186 End stage renal disease: Secondary | ICD-10-CM | POA: Diagnosis not present

## 2015-04-25 DIAGNOSIS — D509 Iron deficiency anemia, unspecified: Secondary | ICD-10-CM | POA: Diagnosis not present

## 2015-04-25 DIAGNOSIS — N2581 Secondary hyperparathyroidism of renal origin: Secondary | ICD-10-CM | POA: Diagnosis not present

## 2015-04-27 DIAGNOSIS — D631 Anemia in chronic kidney disease: Secondary | ICD-10-CM | POA: Diagnosis not present

## 2015-04-27 DIAGNOSIS — Z992 Dependence on renal dialysis: Secondary | ICD-10-CM | POA: Diagnosis not present

## 2015-04-27 DIAGNOSIS — N2581 Secondary hyperparathyroidism of renal origin: Secondary | ICD-10-CM | POA: Diagnosis not present

## 2015-04-27 DIAGNOSIS — D509 Iron deficiency anemia, unspecified: Secondary | ICD-10-CM | POA: Diagnosis not present

## 2015-04-27 DIAGNOSIS — N186 End stage renal disease: Secondary | ICD-10-CM | POA: Diagnosis not present

## 2015-04-28 DIAGNOSIS — I10 Essential (primary) hypertension: Secondary | ICD-10-CM | POA: Diagnosis not present

## 2015-04-28 DIAGNOSIS — Z6827 Body mass index (BMI) 27.0-27.9, adult: Secondary | ICD-10-CM | POA: Diagnosis not present

## 2015-04-28 DIAGNOSIS — E1165 Type 2 diabetes mellitus with hyperglycemia: Secondary | ICD-10-CM | POA: Diagnosis not present

## 2015-04-28 DIAGNOSIS — Z789 Other specified health status: Secondary | ICD-10-CM | POA: Diagnosis not present

## 2015-04-29 DIAGNOSIS — N186 End stage renal disease: Secondary | ICD-10-CM | POA: Diagnosis not present

## 2015-04-29 DIAGNOSIS — D631 Anemia in chronic kidney disease: Secondary | ICD-10-CM | POA: Diagnosis not present

## 2015-04-29 DIAGNOSIS — D509 Iron deficiency anemia, unspecified: Secondary | ICD-10-CM | POA: Diagnosis not present

## 2015-04-29 DIAGNOSIS — Z992 Dependence on renal dialysis: Secondary | ICD-10-CM | POA: Diagnosis not present

## 2015-04-29 DIAGNOSIS — N2581 Secondary hyperparathyroidism of renal origin: Secondary | ICD-10-CM | POA: Diagnosis not present

## 2015-05-02 DIAGNOSIS — N186 End stage renal disease: Secondary | ICD-10-CM | POA: Diagnosis not present

## 2015-05-02 DIAGNOSIS — Z992 Dependence on renal dialysis: Secondary | ICD-10-CM | POA: Diagnosis not present

## 2015-05-02 DIAGNOSIS — D509 Iron deficiency anemia, unspecified: Secondary | ICD-10-CM | POA: Diagnosis not present

## 2015-05-02 DIAGNOSIS — D631 Anemia in chronic kidney disease: Secondary | ICD-10-CM | POA: Diagnosis not present

## 2015-05-02 DIAGNOSIS — N2581 Secondary hyperparathyroidism of renal origin: Secondary | ICD-10-CM | POA: Diagnosis not present

## 2015-05-03 DIAGNOSIS — N186 End stage renal disease: Secondary | ICD-10-CM | POA: Diagnosis not present

## 2015-05-03 DIAGNOSIS — Z992 Dependence on renal dialysis: Secondary | ICD-10-CM | POA: Diagnosis not present

## 2015-05-04 DIAGNOSIS — N186 End stage renal disease: Secondary | ICD-10-CM | POA: Diagnosis not present

## 2015-05-04 DIAGNOSIS — D509 Iron deficiency anemia, unspecified: Secondary | ICD-10-CM | POA: Diagnosis not present

## 2015-05-04 DIAGNOSIS — Z992 Dependence on renal dialysis: Secondary | ICD-10-CM | POA: Diagnosis not present

## 2015-05-04 DIAGNOSIS — D631 Anemia in chronic kidney disease: Secondary | ICD-10-CM | POA: Diagnosis not present

## 2015-05-04 DIAGNOSIS — N2581 Secondary hyperparathyroidism of renal origin: Secondary | ICD-10-CM | POA: Diagnosis not present

## 2015-05-06 DIAGNOSIS — D631 Anemia in chronic kidney disease: Secondary | ICD-10-CM | POA: Diagnosis not present

## 2015-05-06 DIAGNOSIS — Z992 Dependence on renal dialysis: Secondary | ICD-10-CM | POA: Diagnosis not present

## 2015-05-06 DIAGNOSIS — D509 Iron deficiency anemia, unspecified: Secondary | ICD-10-CM | POA: Diagnosis not present

## 2015-05-06 DIAGNOSIS — N2581 Secondary hyperparathyroidism of renal origin: Secondary | ICD-10-CM | POA: Diagnosis not present

## 2015-05-06 DIAGNOSIS — N186 End stage renal disease: Secondary | ICD-10-CM | POA: Diagnosis not present

## 2015-05-09 DIAGNOSIS — N2581 Secondary hyperparathyroidism of renal origin: Secondary | ICD-10-CM | POA: Diagnosis not present

## 2015-05-09 DIAGNOSIS — Z992 Dependence on renal dialysis: Secondary | ICD-10-CM | POA: Diagnosis not present

## 2015-05-09 DIAGNOSIS — D631 Anemia in chronic kidney disease: Secondary | ICD-10-CM | POA: Diagnosis not present

## 2015-05-09 DIAGNOSIS — N186 End stage renal disease: Secondary | ICD-10-CM | POA: Diagnosis not present

## 2015-05-09 DIAGNOSIS — D509 Iron deficiency anemia, unspecified: Secondary | ICD-10-CM | POA: Diagnosis not present

## 2015-05-11 DIAGNOSIS — D631 Anemia in chronic kidney disease: Secondary | ICD-10-CM | POA: Diagnosis not present

## 2015-05-11 DIAGNOSIS — D509 Iron deficiency anemia, unspecified: Secondary | ICD-10-CM | POA: Diagnosis not present

## 2015-05-11 DIAGNOSIS — N2581 Secondary hyperparathyroidism of renal origin: Secondary | ICD-10-CM | POA: Diagnosis not present

## 2015-05-11 DIAGNOSIS — Z992 Dependence on renal dialysis: Secondary | ICD-10-CM | POA: Diagnosis not present

## 2015-05-11 DIAGNOSIS — N186 End stage renal disease: Secondary | ICD-10-CM | POA: Diagnosis not present

## 2015-05-13 DIAGNOSIS — D509 Iron deficiency anemia, unspecified: Secondary | ICD-10-CM | POA: Diagnosis not present

## 2015-05-13 DIAGNOSIS — D631 Anemia in chronic kidney disease: Secondary | ICD-10-CM | POA: Diagnosis not present

## 2015-05-13 DIAGNOSIS — N2581 Secondary hyperparathyroidism of renal origin: Secondary | ICD-10-CM | POA: Diagnosis not present

## 2015-05-13 DIAGNOSIS — N186 End stage renal disease: Secondary | ICD-10-CM | POA: Diagnosis not present

## 2015-05-13 DIAGNOSIS — Z992 Dependence on renal dialysis: Secondary | ICD-10-CM | POA: Diagnosis not present

## 2015-05-16 DIAGNOSIS — D509 Iron deficiency anemia, unspecified: Secondary | ICD-10-CM | POA: Diagnosis not present

## 2015-05-16 DIAGNOSIS — N186 End stage renal disease: Secondary | ICD-10-CM | POA: Diagnosis not present

## 2015-05-16 DIAGNOSIS — Z992 Dependence on renal dialysis: Secondary | ICD-10-CM | POA: Diagnosis not present

## 2015-05-16 DIAGNOSIS — N2581 Secondary hyperparathyroidism of renal origin: Secondary | ICD-10-CM | POA: Diagnosis not present

## 2015-05-16 DIAGNOSIS — D631 Anemia in chronic kidney disease: Secondary | ICD-10-CM | POA: Diagnosis not present

## 2015-05-18 DIAGNOSIS — D631 Anemia in chronic kidney disease: Secondary | ICD-10-CM | POA: Diagnosis not present

## 2015-05-18 DIAGNOSIS — N2581 Secondary hyperparathyroidism of renal origin: Secondary | ICD-10-CM | POA: Diagnosis not present

## 2015-05-18 DIAGNOSIS — D509 Iron deficiency anemia, unspecified: Secondary | ICD-10-CM | POA: Diagnosis not present

## 2015-05-18 DIAGNOSIS — N186 End stage renal disease: Secondary | ICD-10-CM | POA: Diagnosis not present

## 2015-05-18 DIAGNOSIS — Z992 Dependence on renal dialysis: Secondary | ICD-10-CM | POA: Diagnosis not present

## 2015-05-20 DIAGNOSIS — D509 Iron deficiency anemia, unspecified: Secondary | ICD-10-CM | POA: Diagnosis not present

## 2015-05-20 DIAGNOSIS — N2581 Secondary hyperparathyroidism of renal origin: Secondary | ICD-10-CM | POA: Diagnosis not present

## 2015-05-20 DIAGNOSIS — N186 End stage renal disease: Secondary | ICD-10-CM | POA: Diagnosis not present

## 2015-05-20 DIAGNOSIS — D631 Anemia in chronic kidney disease: Secondary | ICD-10-CM | POA: Diagnosis not present

## 2015-05-20 DIAGNOSIS — Z992 Dependence on renal dialysis: Secondary | ICD-10-CM | POA: Diagnosis not present

## 2015-05-23 DIAGNOSIS — N186 End stage renal disease: Secondary | ICD-10-CM | POA: Diagnosis not present

## 2015-05-23 DIAGNOSIS — Z992 Dependence on renal dialysis: Secondary | ICD-10-CM | POA: Diagnosis not present

## 2015-05-23 DIAGNOSIS — N2581 Secondary hyperparathyroidism of renal origin: Secondary | ICD-10-CM | POA: Diagnosis not present

## 2015-05-23 DIAGNOSIS — D509 Iron deficiency anemia, unspecified: Secondary | ICD-10-CM | POA: Diagnosis not present

## 2015-05-23 DIAGNOSIS — D631 Anemia in chronic kidney disease: Secondary | ICD-10-CM | POA: Diagnosis not present

## 2015-05-25 DIAGNOSIS — D631 Anemia in chronic kidney disease: Secondary | ICD-10-CM | POA: Diagnosis not present

## 2015-05-25 DIAGNOSIS — Z992 Dependence on renal dialysis: Secondary | ICD-10-CM | POA: Diagnosis not present

## 2015-05-25 DIAGNOSIS — D509 Iron deficiency anemia, unspecified: Secondary | ICD-10-CM | POA: Diagnosis not present

## 2015-05-25 DIAGNOSIS — N2581 Secondary hyperparathyroidism of renal origin: Secondary | ICD-10-CM | POA: Diagnosis not present

## 2015-05-25 DIAGNOSIS — N186 End stage renal disease: Secondary | ICD-10-CM | POA: Diagnosis not present

## 2015-05-27 DIAGNOSIS — N186 End stage renal disease: Secondary | ICD-10-CM | POA: Diagnosis not present

## 2015-05-27 DIAGNOSIS — D509 Iron deficiency anemia, unspecified: Secondary | ICD-10-CM | POA: Diagnosis not present

## 2015-05-27 DIAGNOSIS — D631 Anemia in chronic kidney disease: Secondary | ICD-10-CM | POA: Diagnosis not present

## 2015-05-27 DIAGNOSIS — N2581 Secondary hyperparathyroidism of renal origin: Secondary | ICD-10-CM | POA: Diagnosis not present

## 2015-05-27 DIAGNOSIS — Z992 Dependence on renal dialysis: Secondary | ICD-10-CM | POA: Diagnosis not present

## 2015-05-30 DIAGNOSIS — N2581 Secondary hyperparathyroidism of renal origin: Secondary | ICD-10-CM | POA: Diagnosis not present

## 2015-05-30 DIAGNOSIS — D509 Iron deficiency anemia, unspecified: Secondary | ICD-10-CM | POA: Diagnosis not present

## 2015-05-30 DIAGNOSIS — D631 Anemia in chronic kidney disease: Secondary | ICD-10-CM | POA: Diagnosis not present

## 2015-05-30 DIAGNOSIS — N186 End stage renal disease: Secondary | ICD-10-CM | POA: Diagnosis not present

## 2015-05-30 DIAGNOSIS — Z992 Dependence on renal dialysis: Secondary | ICD-10-CM | POA: Diagnosis not present

## 2015-05-31 DIAGNOSIS — Z992 Dependence on renal dialysis: Secondary | ICD-10-CM | POA: Diagnosis not present

## 2015-05-31 DIAGNOSIS — N186 End stage renal disease: Secondary | ICD-10-CM | POA: Diagnosis not present

## 2015-06-01 DIAGNOSIS — N186 End stage renal disease: Secondary | ICD-10-CM | POA: Diagnosis not present

## 2015-06-01 DIAGNOSIS — N2581 Secondary hyperparathyroidism of renal origin: Secondary | ICD-10-CM | POA: Diagnosis not present

## 2015-06-01 DIAGNOSIS — D631 Anemia in chronic kidney disease: Secondary | ICD-10-CM | POA: Diagnosis not present

## 2015-06-01 DIAGNOSIS — Z992 Dependence on renal dialysis: Secondary | ICD-10-CM | POA: Diagnosis not present

## 2015-06-01 DIAGNOSIS — D509 Iron deficiency anemia, unspecified: Secondary | ICD-10-CM | POA: Diagnosis not present

## 2015-06-03 DIAGNOSIS — Z992 Dependence on renal dialysis: Secondary | ICD-10-CM | POA: Diagnosis not present

## 2015-06-03 DIAGNOSIS — N186 End stage renal disease: Secondary | ICD-10-CM | POA: Diagnosis not present

## 2015-06-03 DIAGNOSIS — D509 Iron deficiency anemia, unspecified: Secondary | ICD-10-CM | POA: Diagnosis not present

## 2015-06-03 DIAGNOSIS — D631 Anemia in chronic kidney disease: Secondary | ICD-10-CM | POA: Diagnosis not present

## 2015-06-03 DIAGNOSIS — N2581 Secondary hyperparathyroidism of renal origin: Secondary | ICD-10-CM | POA: Diagnosis not present

## 2015-06-06 DIAGNOSIS — D631 Anemia in chronic kidney disease: Secondary | ICD-10-CM | POA: Diagnosis not present

## 2015-06-06 DIAGNOSIS — D509 Iron deficiency anemia, unspecified: Secondary | ICD-10-CM | POA: Diagnosis not present

## 2015-06-06 DIAGNOSIS — N186 End stage renal disease: Secondary | ICD-10-CM | POA: Diagnosis not present

## 2015-06-06 DIAGNOSIS — N2581 Secondary hyperparathyroidism of renal origin: Secondary | ICD-10-CM | POA: Diagnosis not present

## 2015-06-06 DIAGNOSIS — Z992 Dependence on renal dialysis: Secondary | ICD-10-CM | POA: Diagnosis not present

## 2015-06-08 DIAGNOSIS — D509 Iron deficiency anemia, unspecified: Secondary | ICD-10-CM | POA: Diagnosis not present

## 2015-06-08 DIAGNOSIS — N2581 Secondary hyperparathyroidism of renal origin: Secondary | ICD-10-CM | POA: Diagnosis not present

## 2015-06-08 DIAGNOSIS — D631 Anemia in chronic kidney disease: Secondary | ICD-10-CM | POA: Diagnosis not present

## 2015-06-08 DIAGNOSIS — N186 End stage renal disease: Secondary | ICD-10-CM | POA: Diagnosis not present

## 2015-06-08 DIAGNOSIS — Z992 Dependence on renal dialysis: Secondary | ICD-10-CM | POA: Diagnosis not present

## 2015-06-10 DIAGNOSIS — N2581 Secondary hyperparathyroidism of renal origin: Secondary | ICD-10-CM | POA: Diagnosis not present

## 2015-06-10 DIAGNOSIS — D509 Iron deficiency anemia, unspecified: Secondary | ICD-10-CM | POA: Diagnosis not present

## 2015-06-10 DIAGNOSIS — Z992 Dependence on renal dialysis: Secondary | ICD-10-CM | POA: Diagnosis not present

## 2015-06-10 DIAGNOSIS — D631 Anemia in chronic kidney disease: Secondary | ICD-10-CM | POA: Diagnosis not present

## 2015-06-10 DIAGNOSIS — N186 End stage renal disease: Secondary | ICD-10-CM | POA: Diagnosis not present

## 2015-06-13 DIAGNOSIS — N186 End stage renal disease: Secondary | ICD-10-CM | POA: Diagnosis not present

## 2015-06-13 DIAGNOSIS — D631 Anemia in chronic kidney disease: Secondary | ICD-10-CM | POA: Diagnosis not present

## 2015-06-13 DIAGNOSIS — D509 Iron deficiency anemia, unspecified: Secondary | ICD-10-CM | POA: Diagnosis not present

## 2015-06-13 DIAGNOSIS — N2581 Secondary hyperparathyroidism of renal origin: Secondary | ICD-10-CM | POA: Diagnosis not present

## 2015-06-13 DIAGNOSIS — Z992 Dependence on renal dialysis: Secondary | ICD-10-CM | POA: Diagnosis not present

## 2015-06-15 DIAGNOSIS — Z992 Dependence on renal dialysis: Secondary | ICD-10-CM | POA: Diagnosis not present

## 2015-06-15 DIAGNOSIS — D509 Iron deficiency anemia, unspecified: Secondary | ICD-10-CM | POA: Diagnosis not present

## 2015-06-15 DIAGNOSIS — N2581 Secondary hyperparathyroidism of renal origin: Secondary | ICD-10-CM | POA: Diagnosis not present

## 2015-06-15 DIAGNOSIS — N186 End stage renal disease: Secondary | ICD-10-CM | POA: Diagnosis not present

## 2015-06-15 DIAGNOSIS — D631 Anemia in chronic kidney disease: Secondary | ICD-10-CM | POA: Diagnosis not present

## 2015-06-17 DIAGNOSIS — D509 Iron deficiency anemia, unspecified: Secondary | ICD-10-CM | POA: Diagnosis not present

## 2015-06-17 DIAGNOSIS — N2581 Secondary hyperparathyroidism of renal origin: Secondary | ICD-10-CM | POA: Diagnosis not present

## 2015-06-17 DIAGNOSIS — Z992 Dependence on renal dialysis: Secondary | ICD-10-CM | POA: Diagnosis not present

## 2015-06-17 DIAGNOSIS — D631 Anemia in chronic kidney disease: Secondary | ICD-10-CM | POA: Diagnosis not present

## 2015-06-17 DIAGNOSIS — N186 End stage renal disease: Secondary | ICD-10-CM | POA: Diagnosis not present

## 2015-06-20 DIAGNOSIS — N2581 Secondary hyperparathyroidism of renal origin: Secondary | ICD-10-CM | POA: Diagnosis not present

## 2015-06-20 DIAGNOSIS — N186 End stage renal disease: Secondary | ICD-10-CM | POA: Diagnosis not present

## 2015-06-20 DIAGNOSIS — D509 Iron deficiency anemia, unspecified: Secondary | ICD-10-CM | POA: Diagnosis not present

## 2015-06-20 DIAGNOSIS — Z992 Dependence on renal dialysis: Secondary | ICD-10-CM | POA: Diagnosis not present

## 2015-06-20 DIAGNOSIS — D631 Anemia in chronic kidney disease: Secondary | ICD-10-CM | POA: Diagnosis not present

## 2015-06-22 DIAGNOSIS — D631 Anemia in chronic kidney disease: Secondary | ICD-10-CM | POA: Diagnosis not present

## 2015-06-22 DIAGNOSIS — N2581 Secondary hyperparathyroidism of renal origin: Secondary | ICD-10-CM | POA: Diagnosis not present

## 2015-06-22 DIAGNOSIS — N186 End stage renal disease: Secondary | ICD-10-CM | POA: Diagnosis not present

## 2015-06-22 DIAGNOSIS — D509 Iron deficiency anemia, unspecified: Secondary | ICD-10-CM | POA: Diagnosis not present

## 2015-06-22 DIAGNOSIS — Z992 Dependence on renal dialysis: Secondary | ICD-10-CM | POA: Diagnosis not present

## 2015-06-24 DIAGNOSIS — N2581 Secondary hyperparathyroidism of renal origin: Secondary | ICD-10-CM | POA: Diagnosis not present

## 2015-06-24 DIAGNOSIS — N186 End stage renal disease: Secondary | ICD-10-CM | POA: Diagnosis not present

## 2015-06-24 DIAGNOSIS — D509 Iron deficiency anemia, unspecified: Secondary | ICD-10-CM | POA: Diagnosis not present

## 2015-06-24 DIAGNOSIS — Z992 Dependence on renal dialysis: Secondary | ICD-10-CM | POA: Diagnosis not present

## 2015-06-24 DIAGNOSIS — D631 Anemia in chronic kidney disease: Secondary | ICD-10-CM | POA: Diagnosis not present

## 2015-06-27 DIAGNOSIS — D631 Anemia in chronic kidney disease: Secondary | ICD-10-CM | POA: Diagnosis not present

## 2015-06-27 DIAGNOSIS — Z992 Dependence on renal dialysis: Secondary | ICD-10-CM | POA: Diagnosis not present

## 2015-06-27 DIAGNOSIS — N186 End stage renal disease: Secondary | ICD-10-CM | POA: Diagnosis not present

## 2015-06-27 DIAGNOSIS — N2581 Secondary hyperparathyroidism of renal origin: Secondary | ICD-10-CM | POA: Diagnosis not present

## 2015-06-27 DIAGNOSIS — D509 Iron deficiency anemia, unspecified: Secondary | ICD-10-CM | POA: Diagnosis not present

## 2015-06-27 DIAGNOSIS — E119 Type 2 diabetes mellitus without complications: Secondary | ICD-10-CM | POA: Diagnosis not present

## 2015-06-29 DIAGNOSIS — Z992 Dependence on renal dialysis: Secondary | ICD-10-CM | POA: Diagnosis not present

## 2015-06-29 DIAGNOSIS — D509 Iron deficiency anemia, unspecified: Secondary | ICD-10-CM | POA: Diagnosis not present

## 2015-06-29 DIAGNOSIS — N2581 Secondary hyperparathyroidism of renal origin: Secondary | ICD-10-CM | POA: Diagnosis not present

## 2015-06-29 DIAGNOSIS — N186 End stage renal disease: Secondary | ICD-10-CM | POA: Diagnosis not present

## 2015-06-29 DIAGNOSIS — D631 Anemia in chronic kidney disease: Secondary | ICD-10-CM | POA: Diagnosis not present

## 2015-06-30 DIAGNOSIS — B351 Tinea unguium: Secondary | ICD-10-CM | POA: Diagnosis not present

## 2015-06-30 DIAGNOSIS — L851 Acquired keratosis [keratoderma] palmaris et plantaris: Secondary | ICD-10-CM | POA: Diagnosis not present

## 2015-06-30 DIAGNOSIS — E1142 Type 2 diabetes mellitus with diabetic polyneuropathy: Secondary | ICD-10-CM | POA: Diagnosis not present

## 2015-07-01 DIAGNOSIS — Z992 Dependence on renal dialysis: Secondary | ICD-10-CM | POA: Diagnosis not present

## 2015-07-01 DIAGNOSIS — D631 Anemia in chronic kidney disease: Secondary | ICD-10-CM | POA: Diagnosis not present

## 2015-07-01 DIAGNOSIS — N186 End stage renal disease: Secondary | ICD-10-CM | POA: Diagnosis not present

## 2015-07-01 DIAGNOSIS — D509 Iron deficiency anemia, unspecified: Secondary | ICD-10-CM | POA: Diagnosis not present

## 2015-07-01 DIAGNOSIS — N2581 Secondary hyperparathyroidism of renal origin: Secondary | ICD-10-CM | POA: Diagnosis not present

## 2015-07-04 DIAGNOSIS — N2581 Secondary hyperparathyroidism of renal origin: Secondary | ICD-10-CM | POA: Diagnosis not present

## 2015-07-04 DIAGNOSIS — N186 End stage renal disease: Secondary | ICD-10-CM | POA: Diagnosis not present

## 2015-07-04 DIAGNOSIS — D631 Anemia in chronic kidney disease: Secondary | ICD-10-CM | POA: Diagnosis not present

## 2015-07-04 DIAGNOSIS — Z992 Dependence on renal dialysis: Secondary | ICD-10-CM | POA: Diagnosis not present

## 2015-07-04 DIAGNOSIS — D509 Iron deficiency anemia, unspecified: Secondary | ICD-10-CM | POA: Diagnosis not present

## 2015-07-06 DIAGNOSIS — D631 Anemia in chronic kidney disease: Secondary | ICD-10-CM | POA: Diagnosis not present

## 2015-07-06 DIAGNOSIS — N2581 Secondary hyperparathyroidism of renal origin: Secondary | ICD-10-CM | POA: Diagnosis not present

## 2015-07-06 DIAGNOSIS — D509 Iron deficiency anemia, unspecified: Secondary | ICD-10-CM | POA: Diagnosis not present

## 2015-07-06 DIAGNOSIS — Z992 Dependence on renal dialysis: Secondary | ICD-10-CM | POA: Diagnosis not present

## 2015-07-06 DIAGNOSIS — N186 End stage renal disease: Secondary | ICD-10-CM | POA: Diagnosis not present

## 2015-07-08 DIAGNOSIS — D509 Iron deficiency anemia, unspecified: Secondary | ICD-10-CM | POA: Diagnosis not present

## 2015-07-08 DIAGNOSIS — Z992 Dependence on renal dialysis: Secondary | ICD-10-CM | POA: Diagnosis not present

## 2015-07-08 DIAGNOSIS — N2581 Secondary hyperparathyroidism of renal origin: Secondary | ICD-10-CM | POA: Diagnosis not present

## 2015-07-08 DIAGNOSIS — D631 Anemia in chronic kidney disease: Secondary | ICD-10-CM | POA: Diagnosis not present

## 2015-07-08 DIAGNOSIS — N186 End stage renal disease: Secondary | ICD-10-CM | POA: Diagnosis not present

## 2015-07-11 DIAGNOSIS — N186 End stage renal disease: Secondary | ICD-10-CM | POA: Diagnosis not present

## 2015-07-11 DIAGNOSIS — D509 Iron deficiency anemia, unspecified: Secondary | ICD-10-CM | POA: Diagnosis not present

## 2015-07-11 DIAGNOSIS — N2581 Secondary hyperparathyroidism of renal origin: Secondary | ICD-10-CM | POA: Diagnosis not present

## 2015-07-11 DIAGNOSIS — Z992 Dependence on renal dialysis: Secondary | ICD-10-CM | POA: Diagnosis not present

## 2015-07-11 DIAGNOSIS — D631 Anemia in chronic kidney disease: Secondary | ICD-10-CM | POA: Diagnosis not present

## 2015-07-13 DIAGNOSIS — N2581 Secondary hyperparathyroidism of renal origin: Secondary | ICD-10-CM | POA: Diagnosis not present

## 2015-07-13 DIAGNOSIS — Z992 Dependence on renal dialysis: Secondary | ICD-10-CM | POA: Diagnosis not present

## 2015-07-13 DIAGNOSIS — D509 Iron deficiency anemia, unspecified: Secondary | ICD-10-CM | POA: Diagnosis not present

## 2015-07-13 DIAGNOSIS — D631 Anemia in chronic kidney disease: Secondary | ICD-10-CM | POA: Diagnosis not present

## 2015-07-13 DIAGNOSIS — N186 End stage renal disease: Secondary | ICD-10-CM | POA: Diagnosis not present

## 2015-07-15 DIAGNOSIS — Z992 Dependence on renal dialysis: Secondary | ICD-10-CM | POA: Diagnosis not present

## 2015-07-15 DIAGNOSIS — D509 Iron deficiency anemia, unspecified: Secondary | ICD-10-CM | POA: Diagnosis not present

## 2015-07-15 DIAGNOSIS — N2581 Secondary hyperparathyroidism of renal origin: Secondary | ICD-10-CM | POA: Diagnosis not present

## 2015-07-15 DIAGNOSIS — N186 End stage renal disease: Secondary | ICD-10-CM | POA: Diagnosis not present

## 2015-07-15 DIAGNOSIS — D631 Anemia in chronic kidney disease: Secondary | ICD-10-CM | POA: Diagnosis not present

## 2015-07-18 DIAGNOSIS — D509 Iron deficiency anemia, unspecified: Secondary | ICD-10-CM | POA: Diagnosis not present

## 2015-07-18 DIAGNOSIS — D631 Anemia in chronic kidney disease: Secondary | ICD-10-CM | POA: Diagnosis not present

## 2015-07-18 DIAGNOSIS — N186 End stage renal disease: Secondary | ICD-10-CM | POA: Diagnosis not present

## 2015-07-18 DIAGNOSIS — Z992 Dependence on renal dialysis: Secondary | ICD-10-CM | POA: Diagnosis not present

## 2015-07-18 DIAGNOSIS — N2581 Secondary hyperparathyroidism of renal origin: Secondary | ICD-10-CM | POA: Diagnosis not present

## 2015-07-20 DIAGNOSIS — D631 Anemia in chronic kidney disease: Secondary | ICD-10-CM | POA: Diagnosis not present

## 2015-07-20 DIAGNOSIS — N2581 Secondary hyperparathyroidism of renal origin: Secondary | ICD-10-CM | POA: Diagnosis not present

## 2015-07-20 DIAGNOSIS — D509 Iron deficiency anemia, unspecified: Secondary | ICD-10-CM | POA: Diagnosis not present

## 2015-07-20 DIAGNOSIS — Z992 Dependence on renal dialysis: Secondary | ICD-10-CM | POA: Diagnosis not present

## 2015-07-20 DIAGNOSIS — N186 End stage renal disease: Secondary | ICD-10-CM | POA: Diagnosis not present

## 2015-07-22 DIAGNOSIS — N186 End stage renal disease: Secondary | ICD-10-CM | POA: Diagnosis not present

## 2015-07-22 DIAGNOSIS — Z992 Dependence on renal dialysis: Secondary | ICD-10-CM | POA: Diagnosis not present

## 2015-07-22 DIAGNOSIS — D631 Anemia in chronic kidney disease: Secondary | ICD-10-CM | POA: Diagnosis not present

## 2015-07-22 DIAGNOSIS — N2581 Secondary hyperparathyroidism of renal origin: Secondary | ICD-10-CM | POA: Diagnosis not present

## 2015-07-22 DIAGNOSIS — D509 Iron deficiency anemia, unspecified: Secondary | ICD-10-CM | POA: Diagnosis not present

## 2015-07-25 DIAGNOSIS — D509 Iron deficiency anemia, unspecified: Secondary | ICD-10-CM | POA: Diagnosis not present

## 2015-07-25 DIAGNOSIS — N2581 Secondary hyperparathyroidism of renal origin: Secondary | ICD-10-CM | POA: Diagnosis not present

## 2015-07-25 DIAGNOSIS — D631 Anemia in chronic kidney disease: Secondary | ICD-10-CM | POA: Diagnosis not present

## 2015-07-25 DIAGNOSIS — N186 End stage renal disease: Secondary | ICD-10-CM | POA: Diagnosis not present

## 2015-07-25 DIAGNOSIS — Z992 Dependence on renal dialysis: Secondary | ICD-10-CM | POA: Diagnosis not present

## 2015-07-27 DIAGNOSIS — D509 Iron deficiency anemia, unspecified: Secondary | ICD-10-CM | POA: Diagnosis not present

## 2015-07-27 DIAGNOSIS — D631 Anemia in chronic kidney disease: Secondary | ICD-10-CM | POA: Diagnosis not present

## 2015-07-27 DIAGNOSIS — Z992 Dependence on renal dialysis: Secondary | ICD-10-CM | POA: Diagnosis not present

## 2015-07-27 DIAGNOSIS — N2581 Secondary hyperparathyroidism of renal origin: Secondary | ICD-10-CM | POA: Diagnosis not present

## 2015-07-27 DIAGNOSIS — N186 End stage renal disease: Secondary | ICD-10-CM | POA: Diagnosis not present

## 2015-07-29 DIAGNOSIS — D509 Iron deficiency anemia, unspecified: Secondary | ICD-10-CM | POA: Diagnosis not present

## 2015-07-29 DIAGNOSIS — Z992 Dependence on renal dialysis: Secondary | ICD-10-CM | POA: Diagnosis not present

## 2015-07-29 DIAGNOSIS — N2581 Secondary hyperparathyroidism of renal origin: Secondary | ICD-10-CM | POA: Diagnosis not present

## 2015-07-29 DIAGNOSIS — D631 Anemia in chronic kidney disease: Secondary | ICD-10-CM | POA: Diagnosis not present

## 2015-07-29 DIAGNOSIS — N186 End stage renal disease: Secondary | ICD-10-CM | POA: Diagnosis not present

## 2015-07-31 DIAGNOSIS — Z992 Dependence on renal dialysis: Secondary | ICD-10-CM | POA: Diagnosis not present

## 2015-07-31 DIAGNOSIS — N186 End stage renal disease: Secondary | ICD-10-CM | POA: Diagnosis not present

## 2015-08-01 DIAGNOSIS — D509 Iron deficiency anemia, unspecified: Secondary | ICD-10-CM | POA: Diagnosis not present

## 2015-08-01 DIAGNOSIS — D631 Anemia in chronic kidney disease: Secondary | ICD-10-CM | POA: Diagnosis not present

## 2015-08-01 DIAGNOSIS — N2581 Secondary hyperparathyroidism of renal origin: Secondary | ICD-10-CM | POA: Diagnosis not present

## 2015-08-01 DIAGNOSIS — Z992 Dependence on renal dialysis: Secondary | ICD-10-CM | POA: Diagnosis not present

## 2015-08-01 DIAGNOSIS — N186 End stage renal disease: Secondary | ICD-10-CM | POA: Diagnosis not present

## 2015-08-02 DIAGNOSIS — E1165 Type 2 diabetes mellitus with hyperglycemia: Secondary | ICD-10-CM | POA: Diagnosis not present

## 2015-08-02 DIAGNOSIS — Z9114 Patient's other noncompliance with medication regimen: Secondary | ICD-10-CM | POA: Diagnosis not present

## 2015-08-02 DIAGNOSIS — Z992 Dependence on renal dialysis: Secondary | ICD-10-CM | POA: Diagnosis not present

## 2015-08-02 DIAGNOSIS — I10 Essential (primary) hypertension: Secondary | ICD-10-CM | POA: Diagnosis not present

## 2015-08-03 DIAGNOSIS — N2581 Secondary hyperparathyroidism of renal origin: Secondary | ICD-10-CM | POA: Diagnosis not present

## 2015-08-03 DIAGNOSIS — D631 Anemia in chronic kidney disease: Secondary | ICD-10-CM | POA: Diagnosis not present

## 2015-08-03 DIAGNOSIS — D509 Iron deficiency anemia, unspecified: Secondary | ICD-10-CM | POA: Diagnosis not present

## 2015-08-03 DIAGNOSIS — N186 End stage renal disease: Secondary | ICD-10-CM | POA: Diagnosis not present

## 2015-08-03 DIAGNOSIS — Z992 Dependence on renal dialysis: Secondary | ICD-10-CM | POA: Diagnosis not present

## 2015-08-05 DIAGNOSIS — D631 Anemia in chronic kidney disease: Secondary | ICD-10-CM | POA: Diagnosis not present

## 2015-08-05 DIAGNOSIS — N2581 Secondary hyperparathyroidism of renal origin: Secondary | ICD-10-CM | POA: Diagnosis not present

## 2015-08-05 DIAGNOSIS — N186 End stage renal disease: Secondary | ICD-10-CM | POA: Diagnosis not present

## 2015-08-05 DIAGNOSIS — D509 Iron deficiency anemia, unspecified: Secondary | ICD-10-CM | POA: Diagnosis not present

## 2015-08-05 DIAGNOSIS — Z992 Dependence on renal dialysis: Secondary | ICD-10-CM | POA: Diagnosis not present

## 2015-08-08 DIAGNOSIS — D631 Anemia in chronic kidney disease: Secondary | ICD-10-CM | POA: Diagnosis not present

## 2015-08-08 DIAGNOSIS — N2581 Secondary hyperparathyroidism of renal origin: Secondary | ICD-10-CM | POA: Diagnosis not present

## 2015-08-08 DIAGNOSIS — D509 Iron deficiency anemia, unspecified: Secondary | ICD-10-CM | POA: Diagnosis not present

## 2015-08-08 DIAGNOSIS — Z992 Dependence on renal dialysis: Secondary | ICD-10-CM | POA: Diagnosis not present

## 2015-08-08 DIAGNOSIS — N186 End stage renal disease: Secondary | ICD-10-CM | POA: Diagnosis not present

## 2015-08-09 DIAGNOSIS — N186 End stage renal disease: Secondary | ICD-10-CM | POA: Diagnosis not present

## 2015-08-09 DIAGNOSIS — E8770 Fluid overload, unspecified: Secondary | ICD-10-CM | POA: Diagnosis not present

## 2015-08-09 DIAGNOSIS — Z992 Dependence on renal dialysis: Secondary | ICD-10-CM | POA: Diagnosis not present

## 2015-08-10 DIAGNOSIS — N186 End stage renal disease: Secondary | ICD-10-CM | POA: Diagnosis not present

## 2015-08-10 DIAGNOSIS — Z992 Dependence on renal dialysis: Secondary | ICD-10-CM | POA: Diagnosis not present

## 2015-08-10 DIAGNOSIS — D631 Anemia in chronic kidney disease: Secondary | ICD-10-CM | POA: Diagnosis not present

## 2015-08-10 DIAGNOSIS — N2581 Secondary hyperparathyroidism of renal origin: Secondary | ICD-10-CM | POA: Diagnosis not present

## 2015-08-10 DIAGNOSIS — D509 Iron deficiency anemia, unspecified: Secondary | ICD-10-CM | POA: Diagnosis not present

## 2015-08-12 DIAGNOSIS — N2581 Secondary hyperparathyroidism of renal origin: Secondary | ICD-10-CM | POA: Diagnosis not present

## 2015-08-12 DIAGNOSIS — D509 Iron deficiency anemia, unspecified: Secondary | ICD-10-CM | POA: Diagnosis not present

## 2015-08-12 DIAGNOSIS — D631 Anemia in chronic kidney disease: Secondary | ICD-10-CM | POA: Diagnosis not present

## 2015-08-12 DIAGNOSIS — Z992 Dependence on renal dialysis: Secondary | ICD-10-CM | POA: Diagnosis not present

## 2015-08-12 DIAGNOSIS — N186 End stage renal disease: Secondary | ICD-10-CM | POA: Diagnosis not present

## 2015-08-15 DIAGNOSIS — N2581 Secondary hyperparathyroidism of renal origin: Secondary | ICD-10-CM | POA: Diagnosis not present

## 2015-08-15 DIAGNOSIS — Z992 Dependence on renal dialysis: Secondary | ICD-10-CM | POA: Diagnosis not present

## 2015-08-15 DIAGNOSIS — D509 Iron deficiency anemia, unspecified: Secondary | ICD-10-CM | POA: Diagnosis not present

## 2015-08-15 DIAGNOSIS — N186 End stage renal disease: Secondary | ICD-10-CM | POA: Diagnosis not present

## 2015-08-15 DIAGNOSIS — D631 Anemia in chronic kidney disease: Secondary | ICD-10-CM | POA: Diagnosis not present

## 2015-08-17 DIAGNOSIS — N2581 Secondary hyperparathyroidism of renal origin: Secondary | ICD-10-CM | POA: Diagnosis not present

## 2015-08-17 DIAGNOSIS — D509 Iron deficiency anemia, unspecified: Secondary | ICD-10-CM | POA: Diagnosis not present

## 2015-08-17 DIAGNOSIS — Z992 Dependence on renal dialysis: Secondary | ICD-10-CM | POA: Diagnosis not present

## 2015-08-17 DIAGNOSIS — N186 End stage renal disease: Secondary | ICD-10-CM | POA: Diagnosis not present

## 2015-08-17 DIAGNOSIS — D631 Anemia in chronic kidney disease: Secondary | ICD-10-CM | POA: Diagnosis not present

## 2015-08-19 DIAGNOSIS — D631 Anemia in chronic kidney disease: Secondary | ICD-10-CM | POA: Diagnosis not present

## 2015-08-19 DIAGNOSIS — N2581 Secondary hyperparathyroidism of renal origin: Secondary | ICD-10-CM | POA: Diagnosis not present

## 2015-08-19 DIAGNOSIS — Z992 Dependence on renal dialysis: Secondary | ICD-10-CM | POA: Diagnosis not present

## 2015-08-19 DIAGNOSIS — N186 End stage renal disease: Secondary | ICD-10-CM | POA: Diagnosis not present

## 2015-08-19 DIAGNOSIS — D509 Iron deficiency anemia, unspecified: Secondary | ICD-10-CM | POA: Diagnosis not present

## 2015-08-22 DIAGNOSIS — D631 Anemia in chronic kidney disease: Secondary | ICD-10-CM | POA: Diagnosis not present

## 2015-08-22 DIAGNOSIS — Z992 Dependence on renal dialysis: Secondary | ICD-10-CM | POA: Diagnosis not present

## 2015-08-22 DIAGNOSIS — N2581 Secondary hyperparathyroidism of renal origin: Secondary | ICD-10-CM | POA: Diagnosis not present

## 2015-08-22 DIAGNOSIS — D509 Iron deficiency anemia, unspecified: Secondary | ICD-10-CM | POA: Diagnosis not present

## 2015-08-22 DIAGNOSIS — N186 End stage renal disease: Secondary | ICD-10-CM | POA: Diagnosis not present

## 2015-08-24 DIAGNOSIS — D509 Iron deficiency anemia, unspecified: Secondary | ICD-10-CM | POA: Diagnosis not present

## 2015-08-24 DIAGNOSIS — Z992 Dependence on renal dialysis: Secondary | ICD-10-CM | POA: Diagnosis not present

## 2015-08-24 DIAGNOSIS — N186 End stage renal disease: Secondary | ICD-10-CM | POA: Diagnosis not present

## 2015-08-24 DIAGNOSIS — D631 Anemia in chronic kidney disease: Secondary | ICD-10-CM | POA: Diagnosis not present

## 2015-08-24 DIAGNOSIS — N2581 Secondary hyperparathyroidism of renal origin: Secondary | ICD-10-CM | POA: Diagnosis not present

## 2015-08-26 DIAGNOSIS — N2581 Secondary hyperparathyroidism of renal origin: Secondary | ICD-10-CM | POA: Diagnosis not present

## 2015-08-26 DIAGNOSIS — N186 End stage renal disease: Secondary | ICD-10-CM | POA: Diagnosis not present

## 2015-08-26 DIAGNOSIS — D631 Anemia in chronic kidney disease: Secondary | ICD-10-CM | POA: Diagnosis not present

## 2015-08-26 DIAGNOSIS — D509 Iron deficiency anemia, unspecified: Secondary | ICD-10-CM | POA: Diagnosis not present

## 2015-08-26 DIAGNOSIS — Z992 Dependence on renal dialysis: Secondary | ICD-10-CM | POA: Diagnosis not present

## 2015-08-29 DIAGNOSIS — N186 End stage renal disease: Secondary | ICD-10-CM | POA: Diagnosis not present

## 2015-08-29 DIAGNOSIS — N2581 Secondary hyperparathyroidism of renal origin: Secondary | ICD-10-CM | POA: Diagnosis not present

## 2015-08-29 DIAGNOSIS — D631 Anemia in chronic kidney disease: Secondary | ICD-10-CM | POA: Diagnosis not present

## 2015-08-29 DIAGNOSIS — Z992 Dependence on renal dialysis: Secondary | ICD-10-CM | POA: Diagnosis not present

## 2015-08-29 DIAGNOSIS — D509 Iron deficiency anemia, unspecified: Secondary | ICD-10-CM | POA: Diagnosis not present

## 2015-08-31 DIAGNOSIS — D509 Iron deficiency anemia, unspecified: Secondary | ICD-10-CM | POA: Diagnosis not present

## 2015-08-31 DIAGNOSIS — Z992 Dependence on renal dialysis: Secondary | ICD-10-CM | POA: Diagnosis not present

## 2015-08-31 DIAGNOSIS — N2581 Secondary hyperparathyroidism of renal origin: Secondary | ICD-10-CM | POA: Diagnosis not present

## 2015-08-31 DIAGNOSIS — N186 End stage renal disease: Secondary | ICD-10-CM | POA: Diagnosis not present

## 2015-08-31 DIAGNOSIS — D631 Anemia in chronic kidney disease: Secondary | ICD-10-CM | POA: Diagnosis not present

## 2015-09-02 DIAGNOSIS — Z992 Dependence on renal dialysis: Secondary | ICD-10-CM | POA: Diagnosis not present

## 2015-09-02 DIAGNOSIS — N2581 Secondary hyperparathyroidism of renal origin: Secondary | ICD-10-CM | POA: Diagnosis not present

## 2015-09-02 DIAGNOSIS — D509 Iron deficiency anemia, unspecified: Secondary | ICD-10-CM | POA: Diagnosis not present

## 2015-09-02 DIAGNOSIS — N186 End stage renal disease: Secondary | ICD-10-CM | POA: Diagnosis not present

## 2015-09-02 DIAGNOSIS — D631 Anemia in chronic kidney disease: Secondary | ICD-10-CM | POA: Diagnosis not present

## 2015-09-05 DIAGNOSIS — N186 End stage renal disease: Secondary | ICD-10-CM | POA: Diagnosis not present

## 2015-09-05 DIAGNOSIS — Z992 Dependence on renal dialysis: Secondary | ICD-10-CM | POA: Diagnosis not present

## 2015-09-05 DIAGNOSIS — D509 Iron deficiency anemia, unspecified: Secondary | ICD-10-CM | POA: Diagnosis not present

## 2015-09-05 DIAGNOSIS — N2581 Secondary hyperparathyroidism of renal origin: Secondary | ICD-10-CM | POA: Diagnosis not present

## 2015-09-05 DIAGNOSIS — D631 Anemia in chronic kidney disease: Secondary | ICD-10-CM | POA: Diagnosis not present

## 2015-09-06 DIAGNOSIS — E1165 Type 2 diabetes mellitus with hyperglycemia: Secondary | ICD-10-CM | POA: Diagnosis not present

## 2015-09-06 DIAGNOSIS — N19 Unspecified kidney failure: Secondary | ICD-10-CM | POA: Diagnosis not present

## 2015-09-06 DIAGNOSIS — I10 Essential (primary) hypertension: Secondary | ICD-10-CM | POA: Diagnosis not present

## 2015-09-07 DIAGNOSIS — R5383 Other fatigue: Secondary | ICD-10-CM | POA: Diagnosis not present

## 2015-09-07 DIAGNOSIS — E1165 Type 2 diabetes mellitus with hyperglycemia: Secondary | ICD-10-CM | POA: Diagnosis not present

## 2015-09-07 DIAGNOSIS — N186 End stage renal disease: Secondary | ICD-10-CM | POA: Diagnosis not present

## 2015-09-07 DIAGNOSIS — D509 Iron deficiency anemia, unspecified: Secondary | ICD-10-CM | POA: Diagnosis not present

## 2015-09-07 DIAGNOSIS — E7800 Pure hypercholesterolemia, unspecified: Secondary | ICD-10-CM | POA: Diagnosis not present

## 2015-09-07 DIAGNOSIS — Z125 Encounter for screening for malignant neoplasm of prostate: Secondary | ICD-10-CM | POA: Diagnosis not present

## 2015-09-07 DIAGNOSIS — Z992 Dependence on renal dialysis: Secondary | ICD-10-CM | POA: Diagnosis not present

## 2015-09-07 DIAGNOSIS — D631 Anemia in chronic kidney disease: Secondary | ICD-10-CM | POA: Diagnosis not present

## 2015-09-07 DIAGNOSIS — N2581 Secondary hyperparathyroidism of renal origin: Secondary | ICD-10-CM | POA: Diagnosis not present

## 2015-09-09 DIAGNOSIS — Z992 Dependence on renal dialysis: Secondary | ICD-10-CM | POA: Diagnosis not present

## 2015-09-09 DIAGNOSIS — N2581 Secondary hyperparathyroidism of renal origin: Secondary | ICD-10-CM | POA: Diagnosis not present

## 2015-09-09 DIAGNOSIS — N186 End stage renal disease: Secondary | ICD-10-CM | POA: Diagnosis not present

## 2015-09-09 DIAGNOSIS — D631 Anemia in chronic kidney disease: Secondary | ICD-10-CM | POA: Diagnosis not present

## 2015-09-09 DIAGNOSIS — D509 Iron deficiency anemia, unspecified: Secondary | ICD-10-CM | POA: Diagnosis not present

## 2015-09-12 DIAGNOSIS — D509 Iron deficiency anemia, unspecified: Secondary | ICD-10-CM | POA: Diagnosis not present

## 2015-09-12 DIAGNOSIS — N2581 Secondary hyperparathyroidism of renal origin: Secondary | ICD-10-CM | POA: Diagnosis not present

## 2015-09-12 DIAGNOSIS — N186 End stage renal disease: Secondary | ICD-10-CM | POA: Diagnosis not present

## 2015-09-12 DIAGNOSIS — D631 Anemia in chronic kidney disease: Secondary | ICD-10-CM | POA: Diagnosis not present

## 2015-09-12 DIAGNOSIS — Z992 Dependence on renal dialysis: Secondary | ICD-10-CM | POA: Diagnosis not present

## 2015-09-14 DIAGNOSIS — Z992 Dependence on renal dialysis: Secondary | ICD-10-CM | POA: Diagnosis not present

## 2015-09-14 DIAGNOSIS — D509 Iron deficiency anemia, unspecified: Secondary | ICD-10-CM | POA: Diagnosis not present

## 2015-09-14 DIAGNOSIS — D631 Anemia in chronic kidney disease: Secondary | ICD-10-CM | POA: Diagnosis not present

## 2015-09-14 DIAGNOSIS — N2581 Secondary hyperparathyroidism of renal origin: Secondary | ICD-10-CM | POA: Diagnosis not present

## 2015-09-14 DIAGNOSIS — N186 End stage renal disease: Secondary | ICD-10-CM | POA: Diagnosis not present

## 2015-09-16 DIAGNOSIS — D631 Anemia in chronic kidney disease: Secondary | ICD-10-CM | POA: Diagnosis not present

## 2015-09-16 DIAGNOSIS — N2581 Secondary hyperparathyroidism of renal origin: Secondary | ICD-10-CM | POA: Diagnosis not present

## 2015-09-16 DIAGNOSIS — Z992 Dependence on renal dialysis: Secondary | ICD-10-CM | POA: Diagnosis not present

## 2015-09-16 DIAGNOSIS — N186 End stage renal disease: Secondary | ICD-10-CM | POA: Diagnosis not present

## 2015-09-16 DIAGNOSIS — D509 Iron deficiency anemia, unspecified: Secondary | ICD-10-CM | POA: Diagnosis not present

## 2015-09-19 DIAGNOSIS — N186 End stage renal disease: Secondary | ICD-10-CM | POA: Diagnosis not present

## 2015-09-19 DIAGNOSIS — D631 Anemia in chronic kidney disease: Secondary | ICD-10-CM | POA: Diagnosis not present

## 2015-09-19 DIAGNOSIS — N2581 Secondary hyperparathyroidism of renal origin: Secondary | ICD-10-CM | POA: Diagnosis not present

## 2015-09-19 DIAGNOSIS — D509 Iron deficiency anemia, unspecified: Secondary | ICD-10-CM | POA: Diagnosis not present

## 2015-09-19 DIAGNOSIS — Z992 Dependence on renal dialysis: Secondary | ICD-10-CM | POA: Diagnosis not present

## 2015-09-21 DIAGNOSIS — D631 Anemia in chronic kidney disease: Secondary | ICD-10-CM | POA: Diagnosis not present

## 2015-09-21 DIAGNOSIS — D509 Iron deficiency anemia, unspecified: Secondary | ICD-10-CM | POA: Diagnosis not present

## 2015-09-21 DIAGNOSIS — Z992 Dependence on renal dialysis: Secondary | ICD-10-CM | POA: Diagnosis not present

## 2015-09-21 DIAGNOSIS — N186 End stage renal disease: Secondary | ICD-10-CM | POA: Diagnosis not present

## 2015-09-21 DIAGNOSIS — N2581 Secondary hyperparathyroidism of renal origin: Secondary | ICD-10-CM | POA: Diagnosis not present

## 2015-09-22 DIAGNOSIS — I10 Essential (primary) hypertension: Secondary | ICD-10-CM | POA: Diagnosis not present

## 2015-09-22 DIAGNOSIS — E119 Type 2 diabetes mellitus without complications: Secondary | ICD-10-CM | POA: Diagnosis not present

## 2015-09-23 DIAGNOSIS — D509 Iron deficiency anemia, unspecified: Secondary | ICD-10-CM | POA: Diagnosis not present

## 2015-09-23 DIAGNOSIS — N186 End stage renal disease: Secondary | ICD-10-CM | POA: Diagnosis not present

## 2015-09-23 DIAGNOSIS — D631 Anemia in chronic kidney disease: Secondary | ICD-10-CM | POA: Diagnosis not present

## 2015-09-23 DIAGNOSIS — Z992 Dependence on renal dialysis: Secondary | ICD-10-CM | POA: Diagnosis not present

## 2015-09-23 DIAGNOSIS — N2581 Secondary hyperparathyroidism of renal origin: Secondary | ICD-10-CM | POA: Diagnosis not present

## 2015-09-26 DIAGNOSIS — Z992 Dependence on renal dialysis: Secondary | ICD-10-CM | POA: Diagnosis not present

## 2015-09-26 DIAGNOSIS — N2581 Secondary hyperparathyroidism of renal origin: Secondary | ICD-10-CM | POA: Diagnosis not present

## 2015-09-26 DIAGNOSIS — N186 End stage renal disease: Secondary | ICD-10-CM | POA: Diagnosis not present

## 2015-09-26 DIAGNOSIS — D509 Iron deficiency anemia, unspecified: Secondary | ICD-10-CM | POA: Diagnosis not present

## 2015-09-26 DIAGNOSIS — D631 Anemia in chronic kidney disease: Secondary | ICD-10-CM | POA: Diagnosis not present

## 2015-09-28 DIAGNOSIS — Z992 Dependence on renal dialysis: Secondary | ICD-10-CM | POA: Diagnosis not present

## 2015-09-28 DIAGNOSIS — D509 Iron deficiency anemia, unspecified: Secondary | ICD-10-CM | POA: Diagnosis not present

## 2015-09-28 DIAGNOSIS — D631 Anemia in chronic kidney disease: Secondary | ICD-10-CM | POA: Diagnosis not present

## 2015-09-28 DIAGNOSIS — N186 End stage renal disease: Secondary | ICD-10-CM | POA: Diagnosis not present

## 2015-09-28 DIAGNOSIS — E119 Type 2 diabetes mellitus without complications: Secondary | ICD-10-CM | POA: Diagnosis not present

## 2015-09-28 DIAGNOSIS — N2581 Secondary hyperparathyroidism of renal origin: Secondary | ICD-10-CM | POA: Diagnosis not present

## 2015-09-30 DIAGNOSIS — D509 Iron deficiency anemia, unspecified: Secondary | ICD-10-CM | POA: Diagnosis not present

## 2015-09-30 DIAGNOSIS — D631 Anemia in chronic kidney disease: Secondary | ICD-10-CM | POA: Diagnosis not present

## 2015-09-30 DIAGNOSIS — Z992 Dependence on renal dialysis: Secondary | ICD-10-CM | POA: Diagnosis not present

## 2015-09-30 DIAGNOSIS — N186 End stage renal disease: Secondary | ICD-10-CM | POA: Diagnosis not present

## 2015-09-30 DIAGNOSIS — N2581 Secondary hyperparathyroidism of renal origin: Secondary | ICD-10-CM | POA: Diagnosis not present

## 2015-10-03 DIAGNOSIS — N186 End stage renal disease: Secondary | ICD-10-CM | POA: Diagnosis not present

## 2015-10-03 DIAGNOSIS — D509 Iron deficiency anemia, unspecified: Secondary | ICD-10-CM | POA: Diagnosis not present

## 2015-10-03 DIAGNOSIS — D631 Anemia in chronic kidney disease: Secondary | ICD-10-CM | POA: Diagnosis not present

## 2015-10-03 DIAGNOSIS — Z992 Dependence on renal dialysis: Secondary | ICD-10-CM | POA: Diagnosis not present

## 2015-10-03 DIAGNOSIS — N2581 Secondary hyperparathyroidism of renal origin: Secondary | ICD-10-CM | POA: Diagnosis not present

## 2015-10-05 DIAGNOSIS — D631 Anemia in chronic kidney disease: Secondary | ICD-10-CM | POA: Diagnosis not present

## 2015-10-05 DIAGNOSIS — N186 End stage renal disease: Secondary | ICD-10-CM | POA: Diagnosis not present

## 2015-10-05 DIAGNOSIS — D509 Iron deficiency anemia, unspecified: Secondary | ICD-10-CM | POA: Diagnosis not present

## 2015-10-05 DIAGNOSIS — N2581 Secondary hyperparathyroidism of renal origin: Secondary | ICD-10-CM | POA: Diagnosis not present

## 2015-10-05 DIAGNOSIS — Z992 Dependence on renal dialysis: Secondary | ICD-10-CM | POA: Diagnosis not present

## 2015-10-07 DIAGNOSIS — N186 End stage renal disease: Secondary | ICD-10-CM | POA: Diagnosis not present

## 2015-10-07 DIAGNOSIS — Z992 Dependence on renal dialysis: Secondary | ICD-10-CM | POA: Diagnosis not present

## 2015-10-07 DIAGNOSIS — N2581 Secondary hyperparathyroidism of renal origin: Secondary | ICD-10-CM | POA: Diagnosis not present

## 2015-10-07 DIAGNOSIS — D509 Iron deficiency anemia, unspecified: Secondary | ICD-10-CM | POA: Diagnosis not present

## 2015-10-07 DIAGNOSIS — D631 Anemia in chronic kidney disease: Secondary | ICD-10-CM | POA: Diagnosis not present

## 2015-10-10 DIAGNOSIS — N2581 Secondary hyperparathyroidism of renal origin: Secondary | ICD-10-CM | POA: Diagnosis not present

## 2015-10-10 DIAGNOSIS — D631 Anemia in chronic kidney disease: Secondary | ICD-10-CM | POA: Diagnosis not present

## 2015-10-10 DIAGNOSIS — N186 End stage renal disease: Secondary | ICD-10-CM | POA: Diagnosis not present

## 2015-10-10 DIAGNOSIS — D509 Iron deficiency anemia, unspecified: Secondary | ICD-10-CM | POA: Diagnosis not present

## 2015-10-10 DIAGNOSIS — Z992 Dependence on renal dialysis: Secondary | ICD-10-CM | POA: Diagnosis not present

## 2015-10-11 DIAGNOSIS — N186 End stage renal disease: Secondary | ICD-10-CM | POA: Diagnosis not present

## 2015-10-11 DIAGNOSIS — N19 Unspecified kidney failure: Secondary | ICD-10-CM | POA: Diagnosis not present

## 2015-10-11 DIAGNOSIS — E1122 Type 2 diabetes mellitus with diabetic chronic kidney disease: Secondary | ICD-10-CM | POA: Diagnosis not present

## 2015-10-11 DIAGNOSIS — Z299 Encounter for prophylactic measures, unspecified: Secondary | ICD-10-CM | POA: Diagnosis not present

## 2015-10-12 DIAGNOSIS — D631 Anemia in chronic kidney disease: Secondary | ICD-10-CM | POA: Diagnosis not present

## 2015-10-12 DIAGNOSIS — D509 Iron deficiency anemia, unspecified: Secondary | ICD-10-CM | POA: Diagnosis not present

## 2015-10-12 DIAGNOSIS — N186 End stage renal disease: Secondary | ICD-10-CM | POA: Diagnosis not present

## 2015-10-12 DIAGNOSIS — N2581 Secondary hyperparathyroidism of renal origin: Secondary | ICD-10-CM | POA: Diagnosis not present

## 2015-10-12 DIAGNOSIS — Z992 Dependence on renal dialysis: Secondary | ICD-10-CM | POA: Diagnosis not present

## 2015-10-14 DIAGNOSIS — D509 Iron deficiency anemia, unspecified: Secondary | ICD-10-CM | POA: Diagnosis not present

## 2015-10-14 DIAGNOSIS — Z992 Dependence on renal dialysis: Secondary | ICD-10-CM | POA: Diagnosis not present

## 2015-10-14 DIAGNOSIS — N2581 Secondary hyperparathyroidism of renal origin: Secondary | ICD-10-CM | POA: Diagnosis not present

## 2015-10-14 DIAGNOSIS — D631 Anemia in chronic kidney disease: Secondary | ICD-10-CM | POA: Diagnosis not present

## 2015-10-14 DIAGNOSIS — N186 End stage renal disease: Secondary | ICD-10-CM | POA: Diagnosis not present

## 2015-10-17 DIAGNOSIS — D509 Iron deficiency anemia, unspecified: Secondary | ICD-10-CM | POA: Diagnosis not present

## 2015-10-17 DIAGNOSIS — N186 End stage renal disease: Secondary | ICD-10-CM | POA: Diagnosis not present

## 2015-10-17 DIAGNOSIS — D631 Anemia in chronic kidney disease: Secondary | ICD-10-CM | POA: Diagnosis not present

## 2015-10-17 DIAGNOSIS — N2581 Secondary hyperparathyroidism of renal origin: Secondary | ICD-10-CM | POA: Diagnosis not present

## 2015-10-17 DIAGNOSIS — Z992 Dependence on renal dialysis: Secondary | ICD-10-CM | POA: Diagnosis not present

## 2015-10-19 DIAGNOSIS — D631 Anemia in chronic kidney disease: Secondary | ICD-10-CM | POA: Diagnosis not present

## 2015-10-19 DIAGNOSIS — N186 End stage renal disease: Secondary | ICD-10-CM | POA: Diagnosis not present

## 2015-10-19 DIAGNOSIS — Z992 Dependence on renal dialysis: Secondary | ICD-10-CM | POA: Diagnosis not present

## 2015-10-19 DIAGNOSIS — N2581 Secondary hyperparathyroidism of renal origin: Secondary | ICD-10-CM | POA: Diagnosis not present

## 2015-10-19 DIAGNOSIS — D509 Iron deficiency anemia, unspecified: Secondary | ICD-10-CM | POA: Diagnosis not present

## 2015-10-21 DIAGNOSIS — D509 Iron deficiency anemia, unspecified: Secondary | ICD-10-CM | POA: Diagnosis not present

## 2015-10-21 DIAGNOSIS — I10 Essential (primary) hypertension: Secondary | ICD-10-CM | POA: Diagnosis not present

## 2015-10-21 DIAGNOSIS — N2581 Secondary hyperparathyroidism of renal origin: Secondary | ICD-10-CM | POA: Diagnosis not present

## 2015-10-21 DIAGNOSIS — E119 Type 2 diabetes mellitus without complications: Secondary | ICD-10-CM | POA: Diagnosis not present

## 2015-10-21 DIAGNOSIS — D631 Anemia in chronic kidney disease: Secondary | ICD-10-CM | POA: Diagnosis not present

## 2015-10-21 DIAGNOSIS — Z992 Dependence on renal dialysis: Secondary | ICD-10-CM | POA: Diagnosis not present

## 2015-10-21 DIAGNOSIS — N186 End stage renal disease: Secondary | ICD-10-CM | POA: Diagnosis not present

## 2015-10-24 DIAGNOSIS — Z992 Dependence on renal dialysis: Secondary | ICD-10-CM | POA: Diagnosis not present

## 2015-10-24 DIAGNOSIS — N2581 Secondary hyperparathyroidism of renal origin: Secondary | ICD-10-CM | POA: Diagnosis not present

## 2015-10-24 DIAGNOSIS — D631 Anemia in chronic kidney disease: Secondary | ICD-10-CM | POA: Diagnosis not present

## 2015-10-24 DIAGNOSIS — N186 End stage renal disease: Secondary | ICD-10-CM | POA: Diagnosis not present

## 2015-10-24 DIAGNOSIS — D509 Iron deficiency anemia, unspecified: Secondary | ICD-10-CM | POA: Diagnosis not present

## 2015-10-26 DIAGNOSIS — N186 End stage renal disease: Secondary | ICD-10-CM | POA: Diagnosis not present

## 2015-10-26 DIAGNOSIS — D631 Anemia in chronic kidney disease: Secondary | ICD-10-CM | POA: Diagnosis not present

## 2015-10-26 DIAGNOSIS — Z992 Dependence on renal dialysis: Secondary | ICD-10-CM | POA: Diagnosis not present

## 2015-10-26 DIAGNOSIS — N2581 Secondary hyperparathyroidism of renal origin: Secondary | ICD-10-CM | POA: Diagnosis not present

## 2015-10-26 DIAGNOSIS — D509 Iron deficiency anemia, unspecified: Secondary | ICD-10-CM | POA: Diagnosis not present

## 2015-10-27 DIAGNOSIS — E1142 Type 2 diabetes mellitus with diabetic polyneuropathy: Secondary | ICD-10-CM | POA: Diagnosis not present

## 2015-10-27 DIAGNOSIS — B351 Tinea unguium: Secondary | ICD-10-CM | POA: Diagnosis not present

## 2015-10-27 DIAGNOSIS — L851 Acquired keratosis [keratoderma] palmaris et plantaris: Secondary | ICD-10-CM | POA: Diagnosis not present

## 2015-10-28 DIAGNOSIS — D631 Anemia in chronic kidney disease: Secondary | ICD-10-CM | POA: Diagnosis not present

## 2015-10-28 DIAGNOSIS — Z992 Dependence on renal dialysis: Secondary | ICD-10-CM | POA: Diagnosis not present

## 2015-10-28 DIAGNOSIS — N186 End stage renal disease: Secondary | ICD-10-CM | POA: Diagnosis not present

## 2015-10-28 DIAGNOSIS — D509 Iron deficiency anemia, unspecified: Secondary | ICD-10-CM | POA: Diagnosis not present

## 2015-10-28 DIAGNOSIS — N2581 Secondary hyperparathyroidism of renal origin: Secondary | ICD-10-CM | POA: Diagnosis not present

## 2015-10-31 DIAGNOSIS — N186 End stage renal disease: Secondary | ICD-10-CM | POA: Diagnosis not present

## 2015-10-31 DIAGNOSIS — Z992 Dependence on renal dialysis: Secondary | ICD-10-CM | POA: Diagnosis not present

## 2015-10-31 DIAGNOSIS — D631 Anemia in chronic kidney disease: Secondary | ICD-10-CM | POA: Diagnosis not present

## 2015-10-31 DIAGNOSIS — N2581 Secondary hyperparathyroidism of renal origin: Secondary | ICD-10-CM | POA: Diagnosis not present

## 2015-10-31 DIAGNOSIS — D509 Iron deficiency anemia, unspecified: Secondary | ICD-10-CM | POA: Diagnosis not present

## 2015-11-02 DIAGNOSIS — N186 End stage renal disease: Secondary | ICD-10-CM | POA: Diagnosis not present

## 2015-11-02 DIAGNOSIS — D509 Iron deficiency anemia, unspecified: Secondary | ICD-10-CM | POA: Diagnosis not present

## 2015-11-02 DIAGNOSIS — D631 Anemia in chronic kidney disease: Secondary | ICD-10-CM | POA: Diagnosis not present

## 2015-11-02 DIAGNOSIS — Z992 Dependence on renal dialysis: Secondary | ICD-10-CM | POA: Diagnosis not present

## 2015-11-02 DIAGNOSIS — N2581 Secondary hyperparathyroidism of renal origin: Secondary | ICD-10-CM | POA: Diagnosis not present

## 2015-11-04 DIAGNOSIS — N186 End stage renal disease: Secondary | ICD-10-CM | POA: Diagnosis not present

## 2015-11-04 DIAGNOSIS — D509 Iron deficiency anemia, unspecified: Secondary | ICD-10-CM | POA: Diagnosis not present

## 2015-11-04 DIAGNOSIS — D631 Anemia in chronic kidney disease: Secondary | ICD-10-CM | POA: Diagnosis not present

## 2015-11-04 DIAGNOSIS — N2581 Secondary hyperparathyroidism of renal origin: Secondary | ICD-10-CM | POA: Diagnosis not present

## 2015-11-04 DIAGNOSIS — Z992 Dependence on renal dialysis: Secondary | ICD-10-CM | POA: Diagnosis not present

## 2015-11-07 DIAGNOSIS — D509 Iron deficiency anemia, unspecified: Secondary | ICD-10-CM | POA: Diagnosis not present

## 2015-11-07 DIAGNOSIS — D631 Anemia in chronic kidney disease: Secondary | ICD-10-CM | POA: Diagnosis not present

## 2015-11-07 DIAGNOSIS — N186 End stage renal disease: Secondary | ICD-10-CM | POA: Diagnosis not present

## 2015-11-07 DIAGNOSIS — N2581 Secondary hyperparathyroidism of renal origin: Secondary | ICD-10-CM | POA: Diagnosis not present

## 2015-11-07 DIAGNOSIS — Z992 Dependence on renal dialysis: Secondary | ICD-10-CM | POA: Diagnosis not present

## 2015-11-09 DIAGNOSIS — N186 End stage renal disease: Secondary | ICD-10-CM | POA: Diagnosis not present

## 2015-11-09 DIAGNOSIS — Z992 Dependence on renal dialysis: Secondary | ICD-10-CM | POA: Diagnosis not present

## 2015-11-09 DIAGNOSIS — D631 Anemia in chronic kidney disease: Secondary | ICD-10-CM | POA: Diagnosis not present

## 2015-11-09 DIAGNOSIS — D509 Iron deficiency anemia, unspecified: Secondary | ICD-10-CM | POA: Diagnosis not present

## 2015-11-09 DIAGNOSIS — N2581 Secondary hyperparathyroidism of renal origin: Secondary | ICD-10-CM | POA: Diagnosis not present

## 2015-11-11 DIAGNOSIS — N2581 Secondary hyperparathyroidism of renal origin: Secondary | ICD-10-CM | POA: Diagnosis not present

## 2015-11-11 DIAGNOSIS — N186 End stage renal disease: Secondary | ICD-10-CM | POA: Diagnosis not present

## 2015-11-11 DIAGNOSIS — D509 Iron deficiency anemia, unspecified: Secondary | ICD-10-CM | POA: Diagnosis not present

## 2015-11-11 DIAGNOSIS — Z992 Dependence on renal dialysis: Secondary | ICD-10-CM | POA: Diagnosis not present

## 2015-11-11 DIAGNOSIS — D631 Anemia in chronic kidney disease: Secondary | ICD-10-CM | POA: Diagnosis not present

## 2015-11-14 DIAGNOSIS — Z992 Dependence on renal dialysis: Secondary | ICD-10-CM | POA: Diagnosis not present

## 2015-11-14 DIAGNOSIS — D631 Anemia in chronic kidney disease: Secondary | ICD-10-CM | POA: Diagnosis not present

## 2015-11-14 DIAGNOSIS — D509 Iron deficiency anemia, unspecified: Secondary | ICD-10-CM | POA: Diagnosis not present

## 2015-11-14 DIAGNOSIS — N186 End stage renal disease: Secondary | ICD-10-CM | POA: Diagnosis not present

## 2015-11-14 DIAGNOSIS — N2581 Secondary hyperparathyroidism of renal origin: Secondary | ICD-10-CM | POA: Diagnosis not present

## 2015-11-15 DIAGNOSIS — E1122 Type 2 diabetes mellitus with diabetic chronic kidney disease: Secondary | ICD-10-CM | POA: Diagnosis not present

## 2015-11-15 DIAGNOSIS — I10 Essential (primary) hypertension: Secondary | ICD-10-CM | POA: Diagnosis not present

## 2015-11-15 DIAGNOSIS — N186 End stage renal disease: Secondary | ICD-10-CM | POA: Diagnosis not present

## 2015-11-15 DIAGNOSIS — Z992 Dependence on renal dialysis: Secondary | ICD-10-CM | POA: Diagnosis not present

## 2015-11-16 DIAGNOSIS — Z992 Dependence on renal dialysis: Secondary | ICD-10-CM | POA: Diagnosis not present

## 2015-11-16 DIAGNOSIS — D631 Anemia in chronic kidney disease: Secondary | ICD-10-CM | POA: Diagnosis not present

## 2015-11-16 DIAGNOSIS — D509 Iron deficiency anemia, unspecified: Secondary | ICD-10-CM | POA: Diagnosis not present

## 2015-11-16 DIAGNOSIS — N2581 Secondary hyperparathyroidism of renal origin: Secondary | ICD-10-CM | POA: Diagnosis not present

## 2015-11-16 DIAGNOSIS — N186 End stage renal disease: Secondary | ICD-10-CM | POA: Diagnosis not present

## 2015-11-18 DIAGNOSIS — N186 End stage renal disease: Secondary | ICD-10-CM | POA: Diagnosis not present

## 2015-11-18 DIAGNOSIS — D509 Iron deficiency anemia, unspecified: Secondary | ICD-10-CM | POA: Diagnosis not present

## 2015-11-18 DIAGNOSIS — N2581 Secondary hyperparathyroidism of renal origin: Secondary | ICD-10-CM | POA: Diagnosis not present

## 2015-11-18 DIAGNOSIS — Z992 Dependence on renal dialysis: Secondary | ICD-10-CM | POA: Diagnosis not present

## 2015-11-18 DIAGNOSIS — D631 Anemia in chronic kidney disease: Secondary | ICD-10-CM | POA: Diagnosis not present

## 2015-11-21 DIAGNOSIS — N186 End stage renal disease: Secondary | ICD-10-CM | POA: Diagnosis not present

## 2015-11-21 DIAGNOSIS — D509 Iron deficiency anemia, unspecified: Secondary | ICD-10-CM | POA: Diagnosis not present

## 2015-11-21 DIAGNOSIS — N2581 Secondary hyperparathyroidism of renal origin: Secondary | ICD-10-CM | POA: Diagnosis not present

## 2015-11-21 DIAGNOSIS — Z992 Dependence on renal dialysis: Secondary | ICD-10-CM | POA: Diagnosis not present

## 2015-11-21 DIAGNOSIS — D631 Anemia in chronic kidney disease: Secondary | ICD-10-CM | POA: Diagnosis not present

## 2015-11-23 DIAGNOSIS — N2581 Secondary hyperparathyroidism of renal origin: Secondary | ICD-10-CM | POA: Diagnosis not present

## 2015-11-23 DIAGNOSIS — Z992 Dependence on renal dialysis: Secondary | ICD-10-CM | POA: Diagnosis not present

## 2015-11-23 DIAGNOSIS — D509 Iron deficiency anemia, unspecified: Secondary | ICD-10-CM | POA: Diagnosis not present

## 2015-11-23 DIAGNOSIS — D631 Anemia in chronic kidney disease: Secondary | ICD-10-CM | POA: Diagnosis not present

## 2015-11-23 DIAGNOSIS — N186 End stage renal disease: Secondary | ICD-10-CM | POA: Diagnosis not present

## 2015-11-25 DIAGNOSIS — Z992 Dependence on renal dialysis: Secondary | ICD-10-CM | POA: Diagnosis not present

## 2015-11-25 DIAGNOSIS — D631 Anemia in chronic kidney disease: Secondary | ICD-10-CM | POA: Diagnosis not present

## 2015-11-25 DIAGNOSIS — N2581 Secondary hyperparathyroidism of renal origin: Secondary | ICD-10-CM | POA: Diagnosis not present

## 2015-11-25 DIAGNOSIS — N186 End stage renal disease: Secondary | ICD-10-CM | POA: Diagnosis not present

## 2015-11-25 DIAGNOSIS — D509 Iron deficiency anemia, unspecified: Secondary | ICD-10-CM | POA: Diagnosis not present

## 2015-11-28 DIAGNOSIS — D631 Anemia in chronic kidney disease: Secondary | ICD-10-CM | POA: Diagnosis not present

## 2015-11-28 DIAGNOSIS — N2581 Secondary hyperparathyroidism of renal origin: Secondary | ICD-10-CM | POA: Diagnosis not present

## 2015-11-28 DIAGNOSIS — Z992 Dependence on renal dialysis: Secondary | ICD-10-CM | POA: Diagnosis not present

## 2015-11-28 DIAGNOSIS — D509 Iron deficiency anemia, unspecified: Secondary | ICD-10-CM | POA: Diagnosis not present

## 2015-11-28 DIAGNOSIS — N186 End stage renal disease: Secondary | ICD-10-CM | POA: Diagnosis not present

## 2015-11-30 DIAGNOSIS — D509 Iron deficiency anemia, unspecified: Secondary | ICD-10-CM | POA: Diagnosis not present

## 2015-11-30 DIAGNOSIS — N186 End stage renal disease: Secondary | ICD-10-CM | POA: Diagnosis not present

## 2015-11-30 DIAGNOSIS — N2581 Secondary hyperparathyroidism of renal origin: Secondary | ICD-10-CM | POA: Diagnosis not present

## 2015-11-30 DIAGNOSIS — D631 Anemia in chronic kidney disease: Secondary | ICD-10-CM | POA: Diagnosis not present

## 2015-11-30 DIAGNOSIS — Z992 Dependence on renal dialysis: Secondary | ICD-10-CM | POA: Diagnosis not present

## 2015-12-01 DIAGNOSIS — N186 End stage renal disease: Secondary | ICD-10-CM | POA: Diagnosis not present

## 2015-12-01 DIAGNOSIS — Z992 Dependence on renal dialysis: Secondary | ICD-10-CM | POA: Diagnosis not present

## 2015-12-02 DIAGNOSIS — N2581 Secondary hyperparathyroidism of renal origin: Secondary | ICD-10-CM | POA: Diagnosis not present

## 2015-12-02 DIAGNOSIS — Z23 Encounter for immunization: Secondary | ICD-10-CM | POA: Diagnosis not present

## 2015-12-02 DIAGNOSIS — Z992 Dependence on renal dialysis: Secondary | ICD-10-CM | POA: Diagnosis not present

## 2015-12-02 DIAGNOSIS — D631 Anemia in chronic kidney disease: Secondary | ICD-10-CM | POA: Diagnosis not present

## 2015-12-02 DIAGNOSIS — D509 Iron deficiency anemia, unspecified: Secondary | ICD-10-CM | POA: Diagnosis not present

## 2015-12-02 DIAGNOSIS — N186 End stage renal disease: Secondary | ICD-10-CM | POA: Diagnosis not present

## 2015-12-05 DIAGNOSIS — N186 End stage renal disease: Secondary | ICD-10-CM | POA: Diagnosis not present

## 2015-12-05 DIAGNOSIS — D631 Anemia in chronic kidney disease: Secondary | ICD-10-CM | POA: Diagnosis not present

## 2015-12-05 DIAGNOSIS — D509 Iron deficiency anemia, unspecified: Secondary | ICD-10-CM | POA: Diagnosis not present

## 2015-12-05 DIAGNOSIS — Z23 Encounter for immunization: Secondary | ICD-10-CM | POA: Diagnosis not present

## 2015-12-05 DIAGNOSIS — Z992 Dependence on renal dialysis: Secondary | ICD-10-CM | POA: Diagnosis not present

## 2015-12-05 DIAGNOSIS — N2581 Secondary hyperparathyroidism of renal origin: Secondary | ICD-10-CM | POA: Diagnosis not present

## 2015-12-07 DIAGNOSIS — Z992 Dependence on renal dialysis: Secondary | ICD-10-CM | POA: Diagnosis not present

## 2015-12-07 DIAGNOSIS — Z23 Encounter for immunization: Secondary | ICD-10-CM | POA: Diagnosis not present

## 2015-12-07 DIAGNOSIS — N186 End stage renal disease: Secondary | ICD-10-CM | POA: Diagnosis not present

## 2015-12-07 DIAGNOSIS — N2581 Secondary hyperparathyroidism of renal origin: Secondary | ICD-10-CM | POA: Diagnosis not present

## 2015-12-07 DIAGNOSIS — D631 Anemia in chronic kidney disease: Secondary | ICD-10-CM | POA: Diagnosis not present

## 2015-12-07 DIAGNOSIS — D509 Iron deficiency anemia, unspecified: Secondary | ICD-10-CM | POA: Diagnosis not present

## 2015-12-09 DIAGNOSIS — D509 Iron deficiency anemia, unspecified: Secondary | ICD-10-CM | POA: Diagnosis not present

## 2015-12-09 DIAGNOSIS — Z23 Encounter for immunization: Secondary | ICD-10-CM | POA: Diagnosis not present

## 2015-12-09 DIAGNOSIS — Z992 Dependence on renal dialysis: Secondary | ICD-10-CM | POA: Diagnosis not present

## 2015-12-09 DIAGNOSIS — N186 End stage renal disease: Secondary | ICD-10-CM | POA: Diagnosis not present

## 2015-12-09 DIAGNOSIS — D631 Anemia in chronic kidney disease: Secondary | ICD-10-CM | POA: Diagnosis not present

## 2015-12-09 DIAGNOSIS — N2581 Secondary hyperparathyroidism of renal origin: Secondary | ICD-10-CM | POA: Diagnosis not present

## 2015-12-11 DIAGNOSIS — N186 End stage renal disease: Secondary | ICD-10-CM | POA: Diagnosis not present

## 2015-12-11 DIAGNOSIS — D509 Iron deficiency anemia, unspecified: Secondary | ICD-10-CM | POA: Diagnosis not present

## 2015-12-11 DIAGNOSIS — D631 Anemia in chronic kidney disease: Secondary | ICD-10-CM | POA: Diagnosis not present

## 2015-12-11 DIAGNOSIS — Z23 Encounter for immunization: Secondary | ICD-10-CM | POA: Diagnosis not present

## 2015-12-11 DIAGNOSIS — N2581 Secondary hyperparathyroidism of renal origin: Secondary | ICD-10-CM | POA: Diagnosis not present

## 2015-12-11 DIAGNOSIS — Z992 Dependence on renal dialysis: Secondary | ICD-10-CM | POA: Diagnosis not present

## 2015-12-14 DIAGNOSIS — Z23 Encounter for immunization: Secondary | ICD-10-CM | POA: Diagnosis not present

## 2015-12-14 DIAGNOSIS — Z992 Dependence on renal dialysis: Secondary | ICD-10-CM | POA: Diagnosis not present

## 2015-12-14 DIAGNOSIS — D631 Anemia in chronic kidney disease: Secondary | ICD-10-CM | POA: Diagnosis not present

## 2015-12-14 DIAGNOSIS — N186 End stage renal disease: Secondary | ICD-10-CM | POA: Diagnosis not present

## 2015-12-14 DIAGNOSIS — N2581 Secondary hyperparathyroidism of renal origin: Secondary | ICD-10-CM | POA: Diagnosis not present

## 2015-12-14 DIAGNOSIS — D509 Iron deficiency anemia, unspecified: Secondary | ICD-10-CM | POA: Diagnosis not present

## 2015-12-16 DIAGNOSIS — N2581 Secondary hyperparathyroidism of renal origin: Secondary | ICD-10-CM | POA: Diagnosis not present

## 2015-12-16 DIAGNOSIS — D509 Iron deficiency anemia, unspecified: Secondary | ICD-10-CM | POA: Diagnosis not present

## 2015-12-16 DIAGNOSIS — D631 Anemia in chronic kidney disease: Secondary | ICD-10-CM | POA: Diagnosis not present

## 2015-12-16 DIAGNOSIS — Z23 Encounter for immunization: Secondary | ICD-10-CM | POA: Diagnosis not present

## 2015-12-16 DIAGNOSIS — N186 End stage renal disease: Secondary | ICD-10-CM | POA: Diagnosis not present

## 2015-12-16 DIAGNOSIS — Z992 Dependence on renal dialysis: Secondary | ICD-10-CM | POA: Diagnosis not present

## 2015-12-19 DIAGNOSIS — D509 Iron deficiency anemia, unspecified: Secondary | ICD-10-CM | POA: Diagnosis not present

## 2015-12-19 DIAGNOSIS — Z23 Encounter for immunization: Secondary | ICD-10-CM | POA: Diagnosis not present

## 2015-12-19 DIAGNOSIS — D631 Anemia in chronic kidney disease: Secondary | ICD-10-CM | POA: Diagnosis not present

## 2015-12-19 DIAGNOSIS — N186 End stage renal disease: Secondary | ICD-10-CM | POA: Diagnosis not present

## 2015-12-19 DIAGNOSIS — Z992 Dependence on renal dialysis: Secondary | ICD-10-CM | POA: Diagnosis not present

## 2015-12-19 DIAGNOSIS — N2581 Secondary hyperparathyroidism of renal origin: Secondary | ICD-10-CM | POA: Diagnosis not present

## 2015-12-20 DIAGNOSIS — E1122 Type 2 diabetes mellitus with diabetic chronic kidney disease: Secondary | ICD-10-CM | POA: Diagnosis not present

## 2015-12-20 DIAGNOSIS — N186 End stage renal disease: Secondary | ICD-10-CM | POA: Diagnosis not present

## 2015-12-20 DIAGNOSIS — Z6827 Body mass index (BMI) 27.0-27.9, adult: Secondary | ICD-10-CM | POA: Diagnosis not present

## 2015-12-20 DIAGNOSIS — I10 Essential (primary) hypertension: Secondary | ICD-10-CM | POA: Diagnosis not present

## 2015-12-21 DIAGNOSIS — I10 Essential (primary) hypertension: Secondary | ICD-10-CM | POA: Diagnosis not present

## 2015-12-21 DIAGNOSIS — D509 Iron deficiency anemia, unspecified: Secondary | ICD-10-CM | POA: Diagnosis not present

## 2015-12-21 DIAGNOSIS — N186 End stage renal disease: Secondary | ICD-10-CM | POA: Diagnosis not present

## 2015-12-21 DIAGNOSIS — D631 Anemia in chronic kidney disease: Secondary | ICD-10-CM | POA: Diagnosis not present

## 2015-12-21 DIAGNOSIS — Z23 Encounter for immunization: Secondary | ICD-10-CM | POA: Diagnosis not present

## 2015-12-21 DIAGNOSIS — Z992 Dependence on renal dialysis: Secondary | ICD-10-CM | POA: Diagnosis not present

## 2015-12-21 DIAGNOSIS — N2581 Secondary hyperparathyroidism of renal origin: Secondary | ICD-10-CM | POA: Diagnosis not present

## 2015-12-21 DIAGNOSIS — E119 Type 2 diabetes mellitus without complications: Secondary | ICD-10-CM | POA: Diagnosis not present

## 2015-12-23 DIAGNOSIS — D509 Iron deficiency anemia, unspecified: Secondary | ICD-10-CM | POA: Diagnosis not present

## 2015-12-23 DIAGNOSIS — N186 End stage renal disease: Secondary | ICD-10-CM | POA: Diagnosis not present

## 2015-12-23 DIAGNOSIS — D631 Anemia in chronic kidney disease: Secondary | ICD-10-CM | POA: Diagnosis not present

## 2015-12-23 DIAGNOSIS — Z23 Encounter for immunization: Secondary | ICD-10-CM | POA: Diagnosis not present

## 2015-12-23 DIAGNOSIS — Z992 Dependence on renal dialysis: Secondary | ICD-10-CM | POA: Diagnosis not present

## 2015-12-23 DIAGNOSIS — N2581 Secondary hyperparathyroidism of renal origin: Secondary | ICD-10-CM | POA: Diagnosis not present

## 2015-12-26 DIAGNOSIS — D631 Anemia in chronic kidney disease: Secondary | ICD-10-CM | POA: Diagnosis not present

## 2015-12-26 DIAGNOSIS — Z992 Dependence on renal dialysis: Secondary | ICD-10-CM | POA: Diagnosis not present

## 2015-12-26 DIAGNOSIS — N2581 Secondary hyperparathyroidism of renal origin: Secondary | ICD-10-CM | POA: Diagnosis not present

## 2015-12-26 DIAGNOSIS — Z23 Encounter for immunization: Secondary | ICD-10-CM | POA: Diagnosis not present

## 2015-12-26 DIAGNOSIS — D509 Iron deficiency anemia, unspecified: Secondary | ICD-10-CM | POA: Diagnosis not present

## 2015-12-26 DIAGNOSIS — Z794 Long term (current) use of insulin: Secondary | ICD-10-CM | POA: Diagnosis not present

## 2015-12-26 DIAGNOSIS — E119 Type 2 diabetes mellitus without complications: Secondary | ICD-10-CM | POA: Diagnosis not present

## 2015-12-26 DIAGNOSIS — N186 End stage renal disease: Secondary | ICD-10-CM | POA: Diagnosis not present

## 2015-12-28 DIAGNOSIS — N2581 Secondary hyperparathyroidism of renal origin: Secondary | ICD-10-CM | POA: Diagnosis not present

## 2015-12-28 DIAGNOSIS — Z992 Dependence on renal dialysis: Secondary | ICD-10-CM | POA: Diagnosis not present

## 2015-12-28 DIAGNOSIS — D509 Iron deficiency anemia, unspecified: Secondary | ICD-10-CM | POA: Diagnosis not present

## 2015-12-28 DIAGNOSIS — N186 End stage renal disease: Secondary | ICD-10-CM | POA: Diagnosis not present

## 2015-12-28 DIAGNOSIS — D631 Anemia in chronic kidney disease: Secondary | ICD-10-CM | POA: Diagnosis not present

## 2015-12-28 DIAGNOSIS — Z23 Encounter for immunization: Secondary | ICD-10-CM | POA: Diagnosis not present

## 2015-12-30 DIAGNOSIS — D509 Iron deficiency anemia, unspecified: Secondary | ICD-10-CM | POA: Diagnosis not present

## 2015-12-30 DIAGNOSIS — N2581 Secondary hyperparathyroidism of renal origin: Secondary | ICD-10-CM | POA: Diagnosis not present

## 2015-12-30 DIAGNOSIS — D631 Anemia in chronic kidney disease: Secondary | ICD-10-CM | POA: Diagnosis not present

## 2015-12-30 DIAGNOSIS — Z992 Dependence on renal dialysis: Secondary | ICD-10-CM | POA: Diagnosis not present

## 2015-12-30 DIAGNOSIS — Z23 Encounter for immunization: Secondary | ICD-10-CM | POA: Diagnosis not present

## 2015-12-30 DIAGNOSIS — N186 End stage renal disease: Secondary | ICD-10-CM | POA: Diagnosis not present

## 2015-12-31 DIAGNOSIS — Z992 Dependence on renal dialysis: Secondary | ICD-10-CM | POA: Diagnosis not present

## 2015-12-31 DIAGNOSIS — N186 End stage renal disease: Secondary | ICD-10-CM | POA: Diagnosis not present

## 2016-01-02 DIAGNOSIS — D631 Anemia in chronic kidney disease: Secondary | ICD-10-CM | POA: Diagnosis not present

## 2016-01-02 DIAGNOSIS — Z992 Dependence on renal dialysis: Secondary | ICD-10-CM | POA: Diagnosis not present

## 2016-01-02 DIAGNOSIS — D509 Iron deficiency anemia, unspecified: Secondary | ICD-10-CM | POA: Diagnosis not present

## 2016-01-02 DIAGNOSIS — N2581 Secondary hyperparathyroidism of renal origin: Secondary | ICD-10-CM | POA: Diagnosis not present

## 2016-01-02 DIAGNOSIS — N186 End stage renal disease: Secondary | ICD-10-CM | POA: Diagnosis not present

## 2016-01-04 DIAGNOSIS — N2581 Secondary hyperparathyroidism of renal origin: Secondary | ICD-10-CM | POA: Diagnosis not present

## 2016-01-04 DIAGNOSIS — N186 End stage renal disease: Secondary | ICD-10-CM | POA: Diagnosis not present

## 2016-01-04 DIAGNOSIS — Z992 Dependence on renal dialysis: Secondary | ICD-10-CM | POA: Diagnosis not present

## 2016-01-04 DIAGNOSIS — D631 Anemia in chronic kidney disease: Secondary | ICD-10-CM | POA: Diagnosis not present

## 2016-01-04 DIAGNOSIS — D509 Iron deficiency anemia, unspecified: Secondary | ICD-10-CM | POA: Diagnosis not present

## 2016-01-06 DIAGNOSIS — N186 End stage renal disease: Secondary | ICD-10-CM | POA: Diagnosis not present

## 2016-01-06 DIAGNOSIS — D509 Iron deficiency anemia, unspecified: Secondary | ICD-10-CM | POA: Diagnosis not present

## 2016-01-06 DIAGNOSIS — D631 Anemia in chronic kidney disease: Secondary | ICD-10-CM | POA: Diagnosis not present

## 2016-01-06 DIAGNOSIS — N2581 Secondary hyperparathyroidism of renal origin: Secondary | ICD-10-CM | POA: Diagnosis not present

## 2016-01-06 DIAGNOSIS — Z992 Dependence on renal dialysis: Secondary | ICD-10-CM | POA: Diagnosis not present

## 2016-01-09 DIAGNOSIS — N186 End stage renal disease: Secondary | ICD-10-CM | POA: Diagnosis not present

## 2016-01-09 DIAGNOSIS — N2581 Secondary hyperparathyroidism of renal origin: Secondary | ICD-10-CM | POA: Diagnosis not present

## 2016-01-09 DIAGNOSIS — D631 Anemia in chronic kidney disease: Secondary | ICD-10-CM | POA: Diagnosis not present

## 2016-01-09 DIAGNOSIS — Z992 Dependence on renal dialysis: Secondary | ICD-10-CM | POA: Diagnosis not present

## 2016-01-09 DIAGNOSIS — D509 Iron deficiency anemia, unspecified: Secondary | ICD-10-CM | POA: Diagnosis not present

## 2016-01-11 DIAGNOSIS — N186 End stage renal disease: Secondary | ICD-10-CM | POA: Diagnosis not present

## 2016-01-11 DIAGNOSIS — D631 Anemia in chronic kidney disease: Secondary | ICD-10-CM | POA: Diagnosis not present

## 2016-01-11 DIAGNOSIS — N2581 Secondary hyperparathyroidism of renal origin: Secondary | ICD-10-CM | POA: Diagnosis not present

## 2016-01-11 DIAGNOSIS — Z992 Dependence on renal dialysis: Secondary | ICD-10-CM | POA: Diagnosis not present

## 2016-01-11 DIAGNOSIS — D509 Iron deficiency anemia, unspecified: Secondary | ICD-10-CM | POA: Diagnosis not present

## 2016-01-13 DIAGNOSIS — N186 End stage renal disease: Secondary | ICD-10-CM | POA: Diagnosis not present

## 2016-01-13 DIAGNOSIS — D631 Anemia in chronic kidney disease: Secondary | ICD-10-CM | POA: Diagnosis not present

## 2016-01-13 DIAGNOSIS — N2581 Secondary hyperparathyroidism of renal origin: Secondary | ICD-10-CM | POA: Diagnosis not present

## 2016-01-13 DIAGNOSIS — D509 Iron deficiency anemia, unspecified: Secondary | ICD-10-CM | POA: Diagnosis not present

## 2016-01-13 DIAGNOSIS — Z992 Dependence on renal dialysis: Secondary | ICD-10-CM | POA: Diagnosis not present

## 2016-01-16 DIAGNOSIS — N186 End stage renal disease: Secondary | ICD-10-CM | POA: Diagnosis not present

## 2016-01-16 DIAGNOSIS — Z992 Dependence on renal dialysis: Secondary | ICD-10-CM | POA: Diagnosis not present

## 2016-01-16 DIAGNOSIS — N2581 Secondary hyperparathyroidism of renal origin: Secondary | ICD-10-CM | POA: Diagnosis not present

## 2016-01-16 DIAGNOSIS — D509 Iron deficiency anemia, unspecified: Secondary | ICD-10-CM | POA: Diagnosis not present

## 2016-01-16 DIAGNOSIS — D631 Anemia in chronic kidney disease: Secondary | ICD-10-CM | POA: Diagnosis not present

## 2016-01-18 DIAGNOSIS — N186 End stage renal disease: Secondary | ICD-10-CM | POA: Diagnosis not present

## 2016-01-18 DIAGNOSIS — D509 Iron deficiency anemia, unspecified: Secondary | ICD-10-CM | POA: Diagnosis not present

## 2016-01-18 DIAGNOSIS — N2581 Secondary hyperparathyroidism of renal origin: Secondary | ICD-10-CM | POA: Diagnosis not present

## 2016-01-18 DIAGNOSIS — Z992 Dependence on renal dialysis: Secondary | ICD-10-CM | POA: Diagnosis not present

## 2016-01-18 DIAGNOSIS — D631 Anemia in chronic kidney disease: Secondary | ICD-10-CM | POA: Diagnosis not present

## 2016-01-20 DIAGNOSIS — E119 Type 2 diabetes mellitus without complications: Secondary | ICD-10-CM | POA: Diagnosis not present

## 2016-01-20 DIAGNOSIS — D509 Iron deficiency anemia, unspecified: Secondary | ICD-10-CM | POA: Diagnosis not present

## 2016-01-20 DIAGNOSIS — I10 Essential (primary) hypertension: Secondary | ICD-10-CM | POA: Diagnosis not present

## 2016-01-20 DIAGNOSIS — Z992 Dependence on renal dialysis: Secondary | ICD-10-CM | POA: Diagnosis not present

## 2016-01-20 DIAGNOSIS — N186 End stage renal disease: Secondary | ICD-10-CM | POA: Diagnosis not present

## 2016-01-20 DIAGNOSIS — N2581 Secondary hyperparathyroidism of renal origin: Secondary | ICD-10-CM | POA: Diagnosis not present

## 2016-01-20 DIAGNOSIS — D631 Anemia in chronic kidney disease: Secondary | ICD-10-CM | POA: Diagnosis not present

## 2016-01-23 DIAGNOSIS — N186 End stage renal disease: Secondary | ICD-10-CM | POA: Diagnosis not present

## 2016-01-23 DIAGNOSIS — N2581 Secondary hyperparathyroidism of renal origin: Secondary | ICD-10-CM | POA: Diagnosis not present

## 2016-01-23 DIAGNOSIS — D631 Anemia in chronic kidney disease: Secondary | ICD-10-CM | POA: Diagnosis not present

## 2016-01-23 DIAGNOSIS — D509 Iron deficiency anemia, unspecified: Secondary | ICD-10-CM | POA: Diagnosis not present

## 2016-01-23 DIAGNOSIS — Z992 Dependence on renal dialysis: Secondary | ICD-10-CM | POA: Diagnosis not present

## 2016-01-25 DIAGNOSIS — D509 Iron deficiency anemia, unspecified: Secondary | ICD-10-CM | POA: Diagnosis not present

## 2016-01-25 DIAGNOSIS — N2581 Secondary hyperparathyroidism of renal origin: Secondary | ICD-10-CM | POA: Diagnosis not present

## 2016-01-25 DIAGNOSIS — D631 Anemia in chronic kidney disease: Secondary | ICD-10-CM | POA: Diagnosis not present

## 2016-01-25 DIAGNOSIS — N186 End stage renal disease: Secondary | ICD-10-CM | POA: Diagnosis not present

## 2016-01-25 DIAGNOSIS — Z992 Dependence on renal dialysis: Secondary | ICD-10-CM | POA: Diagnosis not present

## 2016-01-27 DIAGNOSIS — Z992 Dependence on renal dialysis: Secondary | ICD-10-CM | POA: Diagnosis not present

## 2016-01-27 DIAGNOSIS — D631 Anemia in chronic kidney disease: Secondary | ICD-10-CM | POA: Diagnosis not present

## 2016-01-27 DIAGNOSIS — N2581 Secondary hyperparathyroidism of renal origin: Secondary | ICD-10-CM | POA: Diagnosis not present

## 2016-01-27 DIAGNOSIS — D509 Iron deficiency anemia, unspecified: Secondary | ICD-10-CM | POA: Diagnosis not present

## 2016-01-27 DIAGNOSIS — N186 End stage renal disease: Secondary | ICD-10-CM | POA: Diagnosis not present

## 2016-01-30 DIAGNOSIS — D509 Iron deficiency anemia, unspecified: Secondary | ICD-10-CM | POA: Diagnosis not present

## 2016-01-30 DIAGNOSIS — D631 Anemia in chronic kidney disease: Secondary | ICD-10-CM | POA: Diagnosis not present

## 2016-01-30 DIAGNOSIS — N186 End stage renal disease: Secondary | ICD-10-CM | POA: Diagnosis not present

## 2016-01-30 DIAGNOSIS — Z992 Dependence on renal dialysis: Secondary | ICD-10-CM | POA: Diagnosis not present

## 2016-01-30 DIAGNOSIS — N2581 Secondary hyperparathyroidism of renal origin: Secondary | ICD-10-CM | POA: Diagnosis not present

## 2016-01-31 DIAGNOSIS — Z992 Dependence on renal dialysis: Secondary | ICD-10-CM | POA: Diagnosis not present

## 2016-01-31 DIAGNOSIS — N186 End stage renal disease: Secondary | ICD-10-CM | POA: Diagnosis not present

## 2016-02-01 DIAGNOSIS — N186 End stage renal disease: Secondary | ICD-10-CM | POA: Diagnosis not present

## 2016-02-01 DIAGNOSIS — D509 Iron deficiency anemia, unspecified: Secondary | ICD-10-CM | POA: Diagnosis not present

## 2016-02-01 DIAGNOSIS — Z992 Dependence on renal dialysis: Secondary | ICD-10-CM | POA: Diagnosis not present

## 2016-02-01 DIAGNOSIS — N2581 Secondary hyperparathyroidism of renal origin: Secondary | ICD-10-CM | POA: Diagnosis not present

## 2016-02-01 DIAGNOSIS — D631 Anemia in chronic kidney disease: Secondary | ICD-10-CM | POA: Diagnosis not present

## 2016-02-03 DIAGNOSIS — N2581 Secondary hyperparathyroidism of renal origin: Secondary | ICD-10-CM | POA: Diagnosis not present

## 2016-02-03 DIAGNOSIS — Z992 Dependence on renal dialysis: Secondary | ICD-10-CM | POA: Diagnosis not present

## 2016-02-03 DIAGNOSIS — D509 Iron deficiency anemia, unspecified: Secondary | ICD-10-CM | POA: Diagnosis not present

## 2016-02-03 DIAGNOSIS — D631 Anemia in chronic kidney disease: Secondary | ICD-10-CM | POA: Diagnosis not present

## 2016-02-03 DIAGNOSIS — N186 End stage renal disease: Secondary | ICD-10-CM | POA: Diagnosis not present

## 2016-02-06 DIAGNOSIS — D509 Iron deficiency anemia, unspecified: Secondary | ICD-10-CM | POA: Diagnosis not present

## 2016-02-06 DIAGNOSIS — Z992 Dependence on renal dialysis: Secondary | ICD-10-CM | POA: Diagnosis not present

## 2016-02-06 DIAGNOSIS — N186 End stage renal disease: Secondary | ICD-10-CM | POA: Diagnosis not present

## 2016-02-06 DIAGNOSIS — D631 Anemia in chronic kidney disease: Secondary | ICD-10-CM | POA: Diagnosis not present

## 2016-02-06 DIAGNOSIS — N2581 Secondary hyperparathyroidism of renal origin: Secondary | ICD-10-CM | POA: Diagnosis not present

## 2016-02-08 DIAGNOSIS — N186 End stage renal disease: Secondary | ICD-10-CM | POA: Diagnosis not present

## 2016-02-08 DIAGNOSIS — D509 Iron deficiency anemia, unspecified: Secondary | ICD-10-CM | POA: Diagnosis not present

## 2016-02-08 DIAGNOSIS — Z992 Dependence on renal dialysis: Secondary | ICD-10-CM | POA: Diagnosis not present

## 2016-02-08 DIAGNOSIS — N2581 Secondary hyperparathyroidism of renal origin: Secondary | ICD-10-CM | POA: Diagnosis not present

## 2016-02-08 DIAGNOSIS — D631 Anemia in chronic kidney disease: Secondary | ICD-10-CM | POA: Diagnosis not present

## 2016-02-09 DIAGNOSIS — Z299 Encounter for prophylactic measures, unspecified: Secondary | ICD-10-CM | POA: Diagnosis not present

## 2016-02-09 DIAGNOSIS — Z0000 Encounter for general adult medical examination without abnormal findings: Secondary | ICD-10-CM | POA: Diagnosis not present

## 2016-02-09 DIAGNOSIS — Z6827 Body mass index (BMI) 27.0-27.9, adult: Secondary | ICD-10-CM | POA: Diagnosis not present

## 2016-02-09 DIAGNOSIS — Z7189 Other specified counseling: Secondary | ICD-10-CM | POA: Diagnosis not present

## 2016-02-09 DIAGNOSIS — R972 Elevated prostate specific antigen [PSA]: Secondary | ICD-10-CM | POA: Diagnosis not present

## 2016-02-09 DIAGNOSIS — Z1389 Encounter for screening for other disorder: Secondary | ICD-10-CM | POA: Diagnosis not present

## 2016-02-09 DIAGNOSIS — Z1211 Encounter for screening for malignant neoplasm of colon: Secondary | ICD-10-CM | POA: Diagnosis not present

## 2016-02-10 DIAGNOSIS — N2581 Secondary hyperparathyroidism of renal origin: Secondary | ICD-10-CM | POA: Diagnosis not present

## 2016-02-10 DIAGNOSIS — D509 Iron deficiency anemia, unspecified: Secondary | ICD-10-CM | POA: Diagnosis not present

## 2016-02-10 DIAGNOSIS — Z992 Dependence on renal dialysis: Secondary | ICD-10-CM | POA: Diagnosis not present

## 2016-02-10 DIAGNOSIS — D631 Anemia in chronic kidney disease: Secondary | ICD-10-CM | POA: Diagnosis not present

## 2016-02-10 DIAGNOSIS — N186 End stage renal disease: Secondary | ICD-10-CM | POA: Diagnosis not present

## 2016-02-13 DIAGNOSIS — N2581 Secondary hyperparathyroidism of renal origin: Secondary | ICD-10-CM | POA: Diagnosis not present

## 2016-02-13 DIAGNOSIS — D631 Anemia in chronic kidney disease: Secondary | ICD-10-CM | POA: Diagnosis not present

## 2016-02-13 DIAGNOSIS — N186 End stage renal disease: Secondary | ICD-10-CM | POA: Diagnosis not present

## 2016-02-13 DIAGNOSIS — D509 Iron deficiency anemia, unspecified: Secondary | ICD-10-CM | POA: Diagnosis not present

## 2016-02-13 DIAGNOSIS — Z992 Dependence on renal dialysis: Secondary | ICD-10-CM | POA: Diagnosis not present

## 2016-02-15 DIAGNOSIS — D631 Anemia in chronic kidney disease: Secondary | ICD-10-CM | POA: Diagnosis not present

## 2016-02-15 DIAGNOSIS — Z992 Dependence on renal dialysis: Secondary | ICD-10-CM | POA: Diagnosis not present

## 2016-02-15 DIAGNOSIS — N186 End stage renal disease: Secondary | ICD-10-CM | POA: Diagnosis not present

## 2016-02-15 DIAGNOSIS — D509 Iron deficiency anemia, unspecified: Secondary | ICD-10-CM | POA: Diagnosis not present

## 2016-02-15 DIAGNOSIS — N2581 Secondary hyperparathyroidism of renal origin: Secondary | ICD-10-CM | POA: Diagnosis not present

## 2016-02-17 DIAGNOSIS — Z992 Dependence on renal dialysis: Secondary | ICD-10-CM | POA: Diagnosis not present

## 2016-02-17 DIAGNOSIS — D509 Iron deficiency anemia, unspecified: Secondary | ICD-10-CM | POA: Diagnosis not present

## 2016-02-17 DIAGNOSIS — N2581 Secondary hyperparathyroidism of renal origin: Secondary | ICD-10-CM | POA: Diagnosis not present

## 2016-02-17 DIAGNOSIS — D631 Anemia in chronic kidney disease: Secondary | ICD-10-CM | POA: Diagnosis not present

## 2016-02-17 DIAGNOSIS — N186 End stage renal disease: Secondary | ICD-10-CM | POA: Diagnosis not present

## 2016-02-20 DIAGNOSIS — Z992 Dependence on renal dialysis: Secondary | ICD-10-CM | POA: Diagnosis not present

## 2016-02-20 DIAGNOSIS — D509 Iron deficiency anemia, unspecified: Secondary | ICD-10-CM | POA: Diagnosis not present

## 2016-02-20 DIAGNOSIS — N186 End stage renal disease: Secondary | ICD-10-CM | POA: Diagnosis not present

## 2016-02-20 DIAGNOSIS — D631 Anemia in chronic kidney disease: Secondary | ICD-10-CM | POA: Diagnosis not present

## 2016-02-20 DIAGNOSIS — N2581 Secondary hyperparathyroidism of renal origin: Secondary | ICD-10-CM | POA: Diagnosis not present

## 2016-02-22 DIAGNOSIS — D631 Anemia in chronic kidney disease: Secondary | ICD-10-CM | POA: Diagnosis not present

## 2016-02-22 DIAGNOSIS — Z992 Dependence on renal dialysis: Secondary | ICD-10-CM | POA: Diagnosis not present

## 2016-02-22 DIAGNOSIS — N2581 Secondary hyperparathyroidism of renal origin: Secondary | ICD-10-CM | POA: Diagnosis not present

## 2016-02-22 DIAGNOSIS — D509 Iron deficiency anemia, unspecified: Secondary | ICD-10-CM | POA: Diagnosis not present

## 2016-02-22 DIAGNOSIS — N186 End stage renal disease: Secondary | ICD-10-CM | POA: Diagnosis not present

## 2016-02-24 DIAGNOSIS — D509 Iron deficiency anemia, unspecified: Secondary | ICD-10-CM | POA: Diagnosis not present

## 2016-02-24 DIAGNOSIS — D631 Anemia in chronic kidney disease: Secondary | ICD-10-CM | POA: Diagnosis not present

## 2016-02-24 DIAGNOSIS — N186 End stage renal disease: Secondary | ICD-10-CM | POA: Diagnosis not present

## 2016-02-24 DIAGNOSIS — N2581 Secondary hyperparathyroidism of renal origin: Secondary | ICD-10-CM | POA: Diagnosis not present

## 2016-02-24 DIAGNOSIS — Z992 Dependence on renal dialysis: Secondary | ICD-10-CM | POA: Diagnosis not present

## 2016-02-27 DIAGNOSIS — L851 Acquired keratosis [keratoderma] palmaris et plantaris: Secondary | ICD-10-CM | POA: Diagnosis not present

## 2016-02-27 DIAGNOSIS — Z992 Dependence on renal dialysis: Secondary | ICD-10-CM | POA: Diagnosis not present

## 2016-02-27 DIAGNOSIS — D631 Anemia in chronic kidney disease: Secondary | ICD-10-CM | POA: Diagnosis not present

## 2016-02-27 DIAGNOSIS — N186 End stage renal disease: Secondary | ICD-10-CM | POA: Diagnosis not present

## 2016-02-27 DIAGNOSIS — D509 Iron deficiency anemia, unspecified: Secondary | ICD-10-CM | POA: Diagnosis not present

## 2016-02-27 DIAGNOSIS — E1142 Type 2 diabetes mellitus with diabetic polyneuropathy: Secondary | ICD-10-CM | POA: Diagnosis not present

## 2016-02-27 DIAGNOSIS — B351 Tinea unguium: Secondary | ICD-10-CM | POA: Diagnosis not present

## 2016-02-27 DIAGNOSIS — N2581 Secondary hyperparathyroidism of renal origin: Secondary | ICD-10-CM | POA: Diagnosis not present

## 2016-02-29 DIAGNOSIS — D631 Anemia in chronic kidney disease: Secondary | ICD-10-CM | POA: Diagnosis not present

## 2016-02-29 DIAGNOSIS — D509 Iron deficiency anemia, unspecified: Secondary | ICD-10-CM | POA: Diagnosis not present

## 2016-02-29 DIAGNOSIS — Z992 Dependence on renal dialysis: Secondary | ICD-10-CM | POA: Diagnosis not present

## 2016-02-29 DIAGNOSIS — N2581 Secondary hyperparathyroidism of renal origin: Secondary | ICD-10-CM | POA: Diagnosis not present

## 2016-02-29 DIAGNOSIS — N186 End stage renal disease: Secondary | ICD-10-CM | POA: Diagnosis not present

## 2016-03-02 DIAGNOSIS — N186 End stage renal disease: Secondary | ICD-10-CM | POA: Diagnosis not present

## 2016-03-02 DIAGNOSIS — N2581 Secondary hyperparathyroidism of renal origin: Secondary | ICD-10-CM | POA: Diagnosis not present

## 2016-03-02 DIAGNOSIS — D509 Iron deficiency anemia, unspecified: Secondary | ICD-10-CM | POA: Diagnosis not present

## 2016-03-02 DIAGNOSIS — D631 Anemia in chronic kidney disease: Secondary | ICD-10-CM | POA: Diagnosis not present

## 2016-03-02 DIAGNOSIS — Z992 Dependence on renal dialysis: Secondary | ICD-10-CM | POA: Diagnosis not present

## 2016-03-05 DIAGNOSIS — D631 Anemia in chronic kidney disease: Secondary | ICD-10-CM | POA: Diagnosis not present

## 2016-03-05 DIAGNOSIS — Z992 Dependence on renal dialysis: Secondary | ICD-10-CM | POA: Diagnosis not present

## 2016-03-05 DIAGNOSIS — D509 Iron deficiency anemia, unspecified: Secondary | ICD-10-CM | POA: Diagnosis not present

## 2016-03-05 DIAGNOSIS — N186 End stage renal disease: Secondary | ICD-10-CM | POA: Diagnosis not present

## 2016-03-05 DIAGNOSIS — N2581 Secondary hyperparathyroidism of renal origin: Secondary | ICD-10-CM | POA: Diagnosis not present

## 2016-03-07 DIAGNOSIS — Z992 Dependence on renal dialysis: Secondary | ICD-10-CM | POA: Diagnosis not present

## 2016-03-07 DIAGNOSIS — N2581 Secondary hyperparathyroidism of renal origin: Secondary | ICD-10-CM | POA: Diagnosis not present

## 2016-03-07 DIAGNOSIS — N186 End stage renal disease: Secondary | ICD-10-CM | POA: Diagnosis not present

## 2016-03-07 DIAGNOSIS — D631 Anemia in chronic kidney disease: Secondary | ICD-10-CM | POA: Diagnosis not present

## 2016-03-07 DIAGNOSIS — D509 Iron deficiency anemia, unspecified: Secondary | ICD-10-CM | POA: Diagnosis not present

## 2016-03-09 DIAGNOSIS — Z992 Dependence on renal dialysis: Secondary | ICD-10-CM | POA: Diagnosis not present

## 2016-03-09 DIAGNOSIS — N186 End stage renal disease: Secondary | ICD-10-CM | POA: Diagnosis not present

## 2016-03-09 DIAGNOSIS — D509 Iron deficiency anemia, unspecified: Secondary | ICD-10-CM | POA: Diagnosis not present

## 2016-03-09 DIAGNOSIS — D631 Anemia in chronic kidney disease: Secondary | ICD-10-CM | POA: Diagnosis not present

## 2016-03-09 DIAGNOSIS — N2581 Secondary hyperparathyroidism of renal origin: Secondary | ICD-10-CM | POA: Diagnosis not present

## 2016-03-12 DIAGNOSIS — D509 Iron deficiency anemia, unspecified: Secondary | ICD-10-CM | POA: Diagnosis not present

## 2016-03-12 DIAGNOSIS — Z992 Dependence on renal dialysis: Secondary | ICD-10-CM | POA: Diagnosis not present

## 2016-03-12 DIAGNOSIS — N2581 Secondary hyperparathyroidism of renal origin: Secondary | ICD-10-CM | POA: Diagnosis not present

## 2016-03-12 DIAGNOSIS — D631 Anemia in chronic kidney disease: Secondary | ICD-10-CM | POA: Diagnosis not present

## 2016-03-12 DIAGNOSIS — N186 End stage renal disease: Secondary | ICD-10-CM | POA: Diagnosis not present

## 2016-03-14 DIAGNOSIS — D631 Anemia in chronic kidney disease: Secondary | ICD-10-CM | POA: Diagnosis not present

## 2016-03-14 DIAGNOSIS — N2581 Secondary hyperparathyroidism of renal origin: Secondary | ICD-10-CM | POA: Diagnosis not present

## 2016-03-14 DIAGNOSIS — N186 End stage renal disease: Secondary | ICD-10-CM | POA: Diagnosis not present

## 2016-03-14 DIAGNOSIS — D509 Iron deficiency anemia, unspecified: Secondary | ICD-10-CM | POA: Diagnosis not present

## 2016-03-14 DIAGNOSIS — Z992 Dependence on renal dialysis: Secondary | ICD-10-CM | POA: Diagnosis not present

## 2016-03-16 DIAGNOSIS — D631 Anemia in chronic kidney disease: Secondary | ICD-10-CM | POA: Diagnosis not present

## 2016-03-16 DIAGNOSIS — N186 End stage renal disease: Secondary | ICD-10-CM | POA: Diagnosis not present

## 2016-03-16 DIAGNOSIS — N2581 Secondary hyperparathyroidism of renal origin: Secondary | ICD-10-CM | POA: Diagnosis not present

## 2016-03-16 DIAGNOSIS — Z992 Dependence on renal dialysis: Secondary | ICD-10-CM | POA: Diagnosis not present

## 2016-03-16 DIAGNOSIS — D509 Iron deficiency anemia, unspecified: Secondary | ICD-10-CM | POA: Diagnosis not present

## 2016-03-19 DIAGNOSIS — N186 End stage renal disease: Secondary | ICD-10-CM | POA: Diagnosis not present

## 2016-03-19 DIAGNOSIS — D509 Iron deficiency anemia, unspecified: Secondary | ICD-10-CM | POA: Diagnosis not present

## 2016-03-19 DIAGNOSIS — Z992 Dependence on renal dialysis: Secondary | ICD-10-CM | POA: Diagnosis not present

## 2016-03-19 DIAGNOSIS — N2581 Secondary hyperparathyroidism of renal origin: Secondary | ICD-10-CM | POA: Diagnosis not present

## 2016-03-19 DIAGNOSIS — D631 Anemia in chronic kidney disease: Secondary | ICD-10-CM | POA: Diagnosis not present

## 2016-03-21 DIAGNOSIS — D509 Iron deficiency anemia, unspecified: Secondary | ICD-10-CM | POA: Diagnosis not present

## 2016-03-21 DIAGNOSIS — Z992 Dependence on renal dialysis: Secondary | ICD-10-CM | POA: Diagnosis not present

## 2016-03-21 DIAGNOSIS — E119 Type 2 diabetes mellitus without complications: Secondary | ICD-10-CM | POA: Diagnosis not present

## 2016-03-21 DIAGNOSIS — D631 Anemia in chronic kidney disease: Secondary | ICD-10-CM | POA: Diagnosis not present

## 2016-03-21 DIAGNOSIS — Z794 Long term (current) use of insulin: Secondary | ICD-10-CM | POA: Diagnosis not present

## 2016-03-21 DIAGNOSIS — N2581 Secondary hyperparathyroidism of renal origin: Secondary | ICD-10-CM | POA: Diagnosis not present

## 2016-03-21 DIAGNOSIS — N186 End stage renal disease: Secondary | ICD-10-CM | POA: Diagnosis not present

## 2016-03-23 DIAGNOSIS — N2581 Secondary hyperparathyroidism of renal origin: Secondary | ICD-10-CM | POA: Diagnosis not present

## 2016-03-23 DIAGNOSIS — N186 End stage renal disease: Secondary | ICD-10-CM | POA: Diagnosis not present

## 2016-03-23 DIAGNOSIS — D631 Anemia in chronic kidney disease: Secondary | ICD-10-CM | POA: Diagnosis not present

## 2016-03-23 DIAGNOSIS — Z992 Dependence on renal dialysis: Secondary | ICD-10-CM | POA: Diagnosis not present

## 2016-03-23 DIAGNOSIS — D509 Iron deficiency anemia, unspecified: Secondary | ICD-10-CM | POA: Diagnosis not present

## 2016-03-25 DIAGNOSIS — N2581 Secondary hyperparathyroidism of renal origin: Secondary | ICD-10-CM | POA: Diagnosis not present

## 2016-03-25 DIAGNOSIS — D509 Iron deficiency anemia, unspecified: Secondary | ICD-10-CM | POA: Diagnosis not present

## 2016-03-25 DIAGNOSIS — Z992 Dependence on renal dialysis: Secondary | ICD-10-CM | POA: Diagnosis not present

## 2016-03-25 DIAGNOSIS — D631 Anemia in chronic kidney disease: Secondary | ICD-10-CM | POA: Diagnosis not present

## 2016-03-25 DIAGNOSIS — N186 End stage renal disease: Secondary | ICD-10-CM | POA: Diagnosis not present

## 2016-03-28 DIAGNOSIS — Z992 Dependence on renal dialysis: Secondary | ICD-10-CM | POA: Diagnosis not present

## 2016-03-28 DIAGNOSIS — N2581 Secondary hyperparathyroidism of renal origin: Secondary | ICD-10-CM | POA: Diagnosis not present

## 2016-03-28 DIAGNOSIS — D509 Iron deficiency anemia, unspecified: Secondary | ICD-10-CM | POA: Diagnosis not present

## 2016-03-28 DIAGNOSIS — D631 Anemia in chronic kidney disease: Secondary | ICD-10-CM | POA: Diagnosis not present

## 2016-03-28 DIAGNOSIS — N186 End stage renal disease: Secondary | ICD-10-CM | POA: Diagnosis not present

## 2016-03-30 DIAGNOSIS — N186 End stage renal disease: Secondary | ICD-10-CM | POA: Diagnosis not present

## 2016-03-30 DIAGNOSIS — Z992 Dependence on renal dialysis: Secondary | ICD-10-CM | POA: Diagnosis not present

## 2016-03-30 DIAGNOSIS — D509 Iron deficiency anemia, unspecified: Secondary | ICD-10-CM | POA: Diagnosis not present

## 2016-03-30 DIAGNOSIS — D631 Anemia in chronic kidney disease: Secondary | ICD-10-CM | POA: Diagnosis not present

## 2016-03-30 DIAGNOSIS — N2581 Secondary hyperparathyroidism of renal origin: Secondary | ICD-10-CM | POA: Diagnosis not present

## 2016-04-01 DIAGNOSIS — D509 Iron deficiency anemia, unspecified: Secondary | ICD-10-CM | POA: Diagnosis not present

## 2016-04-01 DIAGNOSIS — N2581 Secondary hyperparathyroidism of renal origin: Secondary | ICD-10-CM | POA: Diagnosis not present

## 2016-04-01 DIAGNOSIS — N186 End stage renal disease: Secondary | ICD-10-CM | POA: Diagnosis not present

## 2016-04-01 DIAGNOSIS — Z992 Dependence on renal dialysis: Secondary | ICD-10-CM | POA: Diagnosis not present

## 2016-04-01 DIAGNOSIS — D631 Anemia in chronic kidney disease: Secondary | ICD-10-CM | POA: Diagnosis not present

## 2016-04-04 DIAGNOSIS — N186 End stage renal disease: Secondary | ICD-10-CM | POA: Diagnosis not present

## 2016-04-04 DIAGNOSIS — N2581 Secondary hyperparathyroidism of renal origin: Secondary | ICD-10-CM | POA: Diagnosis not present

## 2016-04-04 DIAGNOSIS — Z992 Dependence on renal dialysis: Secondary | ICD-10-CM | POA: Diagnosis not present

## 2016-04-04 DIAGNOSIS — D631 Anemia in chronic kidney disease: Secondary | ICD-10-CM | POA: Diagnosis not present

## 2016-04-04 DIAGNOSIS — D509 Iron deficiency anemia, unspecified: Secondary | ICD-10-CM | POA: Diagnosis not present

## 2016-04-06 DIAGNOSIS — N2581 Secondary hyperparathyroidism of renal origin: Secondary | ICD-10-CM | POA: Diagnosis not present

## 2016-04-06 DIAGNOSIS — N186 End stage renal disease: Secondary | ICD-10-CM | POA: Diagnosis not present

## 2016-04-06 DIAGNOSIS — Z992 Dependence on renal dialysis: Secondary | ICD-10-CM | POA: Diagnosis not present

## 2016-04-06 DIAGNOSIS — D509 Iron deficiency anemia, unspecified: Secondary | ICD-10-CM | POA: Diagnosis not present

## 2016-04-06 DIAGNOSIS — D631 Anemia in chronic kidney disease: Secondary | ICD-10-CM | POA: Diagnosis not present

## 2016-04-09 DIAGNOSIS — D631 Anemia in chronic kidney disease: Secondary | ICD-10-CM | POA: Diagnosis not present

## 2016-04-09 DIAGNOSIS — N186 End stage renal disease: Secondary | ICD-10-CM | POA: Diagnosis not present

## 2016-04-09 DIAGNOSIS — Z992 Dependence on renal dialysis: Secondary | ICD-10-CM | POA: Diagnosis not present

## 2016-04-09 DIAGNOSIS — D509 Iron deficiency anemia, unspecified: Secondary | ICD-10-CM | POA: Diagnosis not present

## 2016-04-09 DIAGNOSIS — N2581 Secondary hyperparathyroidism of renal origin: Secondary | ICD-10-CM | POA: Diagnosis not present

## 2016-04-11 DIAGNOSIS — D509 Iron deficiency anemia, unspecified: Secondary | ICD-10-CM | POA: Diagnosis not present

## 2016-04-11 DIAGNOSIS — Z992 Dependence on renal dialysis: Secondary | ICD-10-CM | POA: Diagnosis not present

## 2016-04-11 DIAGNOSIS — N2581 Secondary hyperparathyroidism of renal origin: Secondary | ICD-10-CM | POA: Diagnosis not present

## 2016-04-11 DIAGNOSIS — N186 End stage renal disease: Secondary | ICD-10-CM | POA: Diagnosis not present

## 2016-04-11 DIAGNOSIS — D631 Anemia in chronic kidney disease: Secondary | ICD-10-CM | POA: Diagnosis not present

## 2016-04-12 DIAGNOSIS — Z992 Dependence on renal dialysis: Secondary | ICD-10-CM | POA: Diagnosis not present

## 2016-04-12 DIAGNOSIS — Z6827 Body mass index (BMI) 27.0-27.9, adult: Secondary | ICD-10-CM | POA: Diagnosis not present

## 2016-04-12 DIAGNOSIS — Z789 Other specified health status: Secondary | ICD-10-CM | POA: Diagnosis not present

## 2016-04-12 DIAGNOSIS — N186 End stage renal disease: Secondary | ICD-10-CM | POA: Diagnosis not present

## 2016-04-12 DIAGNOSIS — E1122 Type 2 diabetes mellitus with diabetic chronic kidney disease: Secondary | ICD-10-CM | POA: Diagnosis not present

## 2016-04-12 DIAGNOSIS — Z299 Encounter for prophylactic measures, unspecified: Secondary | ICD-10-CM | POA: Diagnosis not present

## 2016-04-13 DIAGNOSIS — Z992 Dependence on renal dialysis: Secondary | ICD-10-CM | POA: Diagnosis not present

## 2016-04-13 DIAGNOSIS — N186 End stage renal disease: Secondary | ICD-10-CM | POA: Diagnosis not present

## 2016-04-13 DIAGNOSIS — N2581 Secondary hyperparathyroidism of renal origin: Secondary | ICD-10-CM | POA: Diagnosis not present

## 2016-04-13 DIAGNOSIS — D509 Iron deficiency anemia, unspecified: Secondary | ICD-10-CM | POA: Diagnosis not present

## 2016-04-13 DIAGNOSIS — D631 Anemia in chronic kidney disease: Secondary | ICD-10-CM | POA: Diagnosis not present

## 2016-04-16 DIAGNOSIS — D631 Anemia in chronic kidney disease: Secondary | ICD-10-CM | POA: Diagnosis not present

## 2016-04-16 DIAGNOSIS — D509 Iron deficiency anemia, unspecified: Secondary | ICD-10-CM | POA: Diagnosis not present

## 2016-04-16 DIAGNOSIS — N2581 Secondary hyperparathyroidism of renal origin: Secondary | ICD-10-CM | POA: Diagnosis not present

## 2016-04-16 DIAGNOSIS — Z992 Dependence on renal dialysis: Secondary | ICD-10-CM | POA: Diagnosis not present

## 2016-04-16 DIAGNOSIS — N186 End stage renal disease: Secondary | ICD-10-CM | POA: Diagnosis not present

## 2016-04-18 DIAGNOSIS — D631 Anemia in chronic kidney disease: Secondary | ICD-10-CM | POA: Diagnosis not present

## 2016-04-18 DIAGNOSIS — N186 End stage renal disease: Secondary | ICD-10-CM | POA: Diagnosis not present

## 2016-04-18 DIAGNOSIS — N2581 Secondary hyperparathyroidism of renal origin: Secondary | ICD-10-CM | POA: Diagnosis not present

## 2016-04-18 DIAGNOSIS — D509 Iron deficiency anemia, unspecified: Secondary | ICD-10-CM | POA: Diagnosis not present

## 2016-04-18 DIAGNOSIS — Z992 Dependence on renal dialysis: Secondary | ICD-10-CM | POA: Diagnosis not present

## 2016-04-20 DIAGNOSIS — Z992 Dependence on renal dialysis: Secondary | ICD-10-CM | POA: Diagnosis not present

## 2016-04-20 DIAGNOSIS — D631 Anemia in chronic kidney disease: Secondary | ICD-10-CM | POA: Diagnosis not present

## 2016-04-20 DIAGNOSIS — N2581 Secondary hyperparathyroidism of renal origin: Secondary | ICD-10-CM | POA: Diagnosis not present

## 2016-04-20 DIAGNOSIS — N186 End stage renal disease: Secondary | ICD-10-CM | POA: Diagnosis not present

## 2016-04-20 DIAGNOSIS — D509 Iron deficiency anemia, unspecified: Secondary | ICD-10-CM | POA: Diagnosis not present

## 2016-04-23 DIAGNOSIS — D631 Anemia in chronic kidney disease: Secondary | ICD-10-CM | POA: Diagnosis not present

## 2016-04-23 DIAGNOSIS — D509 Iron deficiency anemia, unspecified: Secondary | ICD-10-CM | POA: Diagnosis not present

## 2016-04-23 DIAGNOSIS — Z992 Dependence on renal dialysis: Secondary | ICD-10-CM | POA: Diagnosis not present

## 2016-04-23 DIAGNOSIS — N2581 Secondary hyperparathyroidism of renal origin: Secondary | ICD-10-CM | POA: Diagnosis not present

## 2016-04-23 DIAGNOSIS — N186 End stage renal disease: Secondary | ICD-10-CM | POA: Diagnosis not present

## 2016-04-25 DIAGNOSIS — Z992 Dependence on renal dialysis: Secondary | ICD-10-CM | POA: Diagnosis not present

## 2016-04-25 DIAGNOSIS — D509 Iron deficiency anemia, unspecified: Secondary | ICD-10-CM | POA: Diagnosis not present

## 2016-04-25 DIAGNOSIS — N2581 Secondary hyperparathyroidism of renal origin: Secondary | ICD-10-CM | POA: Diagnosis not present

## 2016-04-25 DIAGNOSIS — N186 End stage renal disease: Secondary | ICD-10-CM | POA: Diagnosis not present

## 2016-04-25 DIAGNOSIS — D631 Anemia in chronic kidney disease: Secondary | ICD-10-CM | POA: Diagnosis not present

## 2016-04-27 DIAGNOSIS — N186 End stage renal disease: Secondary | ICD-10-CM | POA: Diagnosis not present

## 2016-04-27 DIAGNOSIS — N2581 Secondary hyperparathyroidism of renal origin: Secondary | ICD-10-CM | POA: Diagnosis not present

## 2016-04-27 DIAGNOSIS — D631 Anemia in chronic kidney disease: Secondary | ICD-10-CM | POA: Diagnosis not present

## 2016-04-27 DIAGNOSIS — D509 Iron deficiency anemia, unspecified: Secondary | ICD-10-CM | POA: Diagnosis not present

## 2016-04-27 DIAGNOSIS — Z992 Dependence on renal dialysis: Secondary | ICD-10-CM | POA: Diagnosis not present

## 2016-04-30 DIAGNOSIS — D509 Iron deficiency anemia, unspecified: Secondary | ICD-10-CM | POA: Diagnosis not present

## 2016-04-30 DIAGNOSIS — Z992 Dependence on renal dialysis: Secondary | ICD-10-CM | POA: Diagnosis not present

## 2016-04-30 DIAGNOSIS — N186 End stage renal disease: Secondary | ICD-10-CM | POA: Diagnosis not present

## 2016-04-30 DIAGNOSIS — D631 Anemia in chronic kidney disease: Secondary | ICD-10-CM | POA: Diagnosis not present

## 2016-04-30 DIAGNOSIS — N2581 Secondary hyperparathyroidism of renal origin: Secondary | ICD-10-CM | POA: Diagnosis not present

## 2016-05-02 DIAGNOSIS — D631 Anemia in chronic kidney disease: Secondary | ICD-10-CM | POA: Diagnosis not present

## 2016-05-02 DIAGNOSIS — D509 Iron deficiency anemia, unspecified: Secondary | ICD-10-CM | POA: Diagnosis not present

## 2016-05-02 DIAGNOSIS — N2581 Secondary hyperparathyroidism of renal origin: Secondary | ICD-10-CM | POA: Diagnosis not present

## 2016-05-02 DIAGNOSIS — N186 End stage renal disease: Secondary | ICD-10-CM | POA: Diagnosis not present

## 2016-05-02 DIAGNOSIS — Z992 Dependence on renal dialysis: Secondary | ICD-10-CM | POA: Diagnosis not present

## 2016-05-03 DIAGNOSIS — D631 Anemia in chronic kidney disease: Secondary | ICD-10-CM | POA: Diagnosis not present

## 2016-05-03 DIAGNOSIS — Z992 Dependence on renal dialysis: Secondary | ICD-10-CM | POA: Diagnosis not present

## 2016-05-03 DIAGNOSIS — N186 End stage renal disease: Secondary | ICD-10-CM | POA: Diagnosis not present

## 2016-05-03 DIAGNOSIS — N2581 Secondary hyperparathyroidism of renal origin: Secondary | ICD-10-CM | POA: Diagnosis not present

## 2016-05-03 DIAGNOSIS — D509 Iron deficiency anemia, unspecified: Secondary | ICD-10-CM | POA: Diagnosis not present

## 2016-05-04 DIAGNOSIS — D631 Anemia in chronic kidney disease: Secondary | ICD-10-CM | POA: Diagnosis not present

## 2016-05-04 DIAGNOSIS — D509 Iron deficiency anemia, unspecified: Secondary | ICD-10-CM | POA: Diagnosis not present

## 2016-05-04 DIAGNOSIS — N2581 Secondary hyperparathyroidism of renal origin: Secondary | ICD-10-CM | POA: Diagnosis not present

## 2016-05-04 DIAGNOSIS — N186 End stage renal disease: Secondary | ICD-10-CM | POA: Diagnosis not present

## 2016-05-04 DIAGNOSIS — Z992 Dependence on renal dialysis: Secondary | ICD-10-CM | POA: Diagnosis not present

## 2016-05-07 DIAGNOSIS — D509 Iron deficiency anemia, unspecified: Secondary | ICD-10-CM | POA: Diagnosis not present

## 2016-05-07 DIAGNOSIS — D631 Anemia in chronic kidney disease: Secondary | ICD-10-CM | POA: Diagnosis not present

## 2016-05-07 DIAGNOSIS — Z992 Dependence on renal dialysis: Secondary | ICD-10-CM | POA: Diagnosis not present

## 2016-05-07 DIAGNOSIS — N2581 Secondary hyperparathyroidism of renal origin: Secondary | ICD-10-CM | POA: Diagnosis not present

## 2016-05-07 DIAGNOSIS — N186 End stage renal disease: Secondary | ICD-10-CM | POA: Diagnosis not present

## 2016-05-09 DIAGNOSIS — D509 Iron deficiency anemia, unspecified: Secondary | ICD-10-CM | POA: Diagnosis not present

## 2016-05-09 DIAGNOSIS — Z992 Dependence on renal dialysis: Secondary | ICD-10-CM | POA: Diagnosis not present

## 2016-05-09 DIAGNOSIS — N2581 Secondary hyperparathyroidism of renal origin: Secondary | ICD-10-CM | POA: Diagnosis not present

## 2016-05-09 DIAGNOSIS — N186 End stage renal disease: Secondary | ICD-10-CM | POA: Diagnosis not present

## 2016-05-09 DIAGNOSIS — D631 Anemia in chronic kidney disease: Secondary | ICD-10-CM | POA: Diagnosis not present

## 2016-05-11 DIAGNOSIS — N186 End stage renal disease: Secondary | ICD-10-CM | POA: Diagnosis not present

## 2016-05-11 DIAGNOSIS — Z992 Dependence on renal dialysis: Secondary | ICD-10-CM | POA: Diagnosis not present

## 2016-05-11 DIAGNOSIS — D631 Anemia in chronic kidney disease: Secondary | ICD-10-CM | POA: Diagnosis not present

## 2016-05-11 DIAGNOSIS — D509 Iron deficiency anemia, unspecified: Secondary | ICD-10-CM | POA: Diagnosis not present

## 2016-05-11 DIAGNOSIS — N2581 Secondary hyperparathyroidism of renal origin: Secondary | ICD-10-CM | POA: Diagnosis not present

## 2016-05-14 DIAGNOSIS — D631 Anemia in chronic kidney disease: Secondary | ICD-10-CM | POA: Diagnosis not present

## 2016-05-14 DIAGNOSIS — D509 Iron deficiency anemia, unspecified: Secondary | ICD-10-CM | POA: Diagnosis not present

## 2016-05-14 DIAGNOSIS — Z992 Dependence on renal dialysis: Secondary | ICD-10-CM | POA: Diagnosis not present

## 2016-05-14 DIAGNOSIS — N2581 Secondary hyperparathyroidism of renal origin: Secondary | ICD-10-CM | POA: Diagnosis not present

## 2016-05-14 DIAGNOSIS — N186 End stage renal disease: Secondary | ICD-10-CM | POA: Diagnosis not present

## 2016-05-16 DIAGNOSIS — Z992 Dependence on renal dialysis: Secondary | ICD-10-CM | POA: Diagnosis not present

## 2016-05-16 DIAGNOSIS — N2581 Secondary hyperparathyroidism of renal origin: Secondary | ICD-10-CM | POA: Diagnosis not present

## 2016-05-16 DIAGNOSIS — D509 Iron deficiency anemia, unspecified: Secondary | ICD-10-CM | POA: Diagnosis not present

## 2016-05-16 DIAGNOSIS — N186 End stage renal disease: Secondary | ICD-10-CM | POA: Diagnosis not present

## 2016-05-16 DIAGNOSIS — D631 Anemia in chronic kidney disease: Secondary | ICD-10-CM | POA: Diagnosis not present

## 2016-05-17 DIAGNOSIS — N186 End stage renal disease: Secondary | ICD-10-CM | POA: Diagnosis not present

## 2016-05-17 DIAGNOSIS — Z789 Other specified health status: Secondary | ICD-10-CM | POA: Diagnosis not present

## 2016-05-17 DIAGNOSIS — J61 Pneumoconiosis due to asbestos and other mineral fibers: Secondary | ICD-10-CM | POA: Diagnosis not present

## 2016-05-17 DIAGNOSIS — E1122 Type 2 diabetes mellitus with diabetic chronic kidney disease: Secondary | ICD-10-CM | POA: Diagnosis not present

## 2016-05-17 DIAGNOSIS — Z6827 Body mass index (BMI) 27.0-27.9, adult: Secondary | ICD-10-CM | POA: Diagnosis not present

## 2016-05-17 DIAGNOSIS — I10 Essential (primary) hypertension: Secondary | ICD-10-CM | POA: Diagnosis not present

## 2016-05-17 DIAGNOSIS — Z992 Dependence on renal dialysis: Secondary | ICD-10-CM | POA: Diagnosis not present

## 2016-05-17 DIAGNOSIS — Z299 Encounter for prophylactic measures, unspecified: Secondary | ICD-10-CM | POA: Diagnosis not present

## 2016-05-18 DIAGNOSIS — D631 Anemia in chronic kidney disease: Secondary | ICD-10-CM | POA: Diagnosis not present

## 2016-05-18 DIAGNOSIS — N186 End stage renal disease: Secondary | ICD-10-CM | POA: Diagnosis not present

## 2016-05-18 DIAGNOSIS — N2581 Secondary hyperparathyroidism of renal origin: Secondary | ICD-10-CM | POA: Diagnosis not present

## 2016-05-18 DIAGNOSIS — D509 Iron deficiency anemia, unspecified: Secondary | ICD-10-CM | POA: Diagnosis not present

## 2016-05-18 DIAGNOSIS — Z992 Dependence on renal dialysis: Secondary | ICD-10-CM | POA: Diagnosis not present

## 2016-05-21 DIAGNOSIS — N186 End stage renal disease: Secondary | ICD-10-CM | POA: Diagnosis not present

## 2016-05-21 DIAGNOSIS — Z992 Dependence on renal dialysis: Secondary | ICD-10-CM | POA: Diagnosis not present

## 2016-05-21 DIAGNOSIS — D631 Anemia in chronic kidney disease: Secondary | ICD-10-CM | POA: Diagnosis not present

## 2016-05-21 DIAGNOSIS — D509 Iron deficiency anemia, unspecified: Secondary | ICD-10-CM | POA: Diagnosis not present

## 2016-05-21 DIAGNOSIS — N2581 Secondary hyperparathyroidism of renal origin: Secondary | ICD-10-CM | POA: Diagnosis not present

## 2016-05-23 DIAGNOSIS — D631 Anemia in chronic kidney disease: Secondary | ICD-10-CM | POA: Diagnosis not present

## 2016-05-23 DIAGNOSIS — D509 Iron deficiency anemia, unspecified: Secondary | ICD-10-CM | POA: Diagnosis not present

## 2016-05-23 DIAGNOSIS — N2581 Secondary hyperparathyroidism of renal origin: Secondary | ICD-10-CM | POA: Diagnosis not present

## 2016-05-23 DIAGNOSIS — Z992 Dependence on renal dialysis: Secondary | ICD-10-CM | POA: Diagnosis not present

## 2016-05-23 DIAGNOSIS — N186 End stage renal disease: Secondary | ICD-10-CM | POA: Diagnosis not present

## 2016-05-25 DIAGNOSIS — N186 End stage renal disease: Secondary | ICD-10-CM | POA: Diagnosis not present

## 2016-05-25 DIAGNOSIS — D509 Iron deficiency anemia, unspecified: Secondary | ICD-10-CM | POA: Diagnosis not present

## 2016-05-25 DIAGNOSIS — Z992 Dependence on renal dialysis: Secondary | ICD-10-CM | POA: Diagnosis not present

## 2016-05-25 DIAGNOSIS — N2581 Secondary hyperparathyroidism of renal origin: Secondary | ICD-10-CM | POA: Diagnosis not present

## 2016-05-25 DIAGNOSIS — D631 Anemia in chronic kidney disease: Secondary | ICD-10-CM | POA: Diagnosis not present

## 2016-05-28 DIAGNOSIS — D631 Anemia in chronic kidney disease: Secondary | ICD-10-CM | POA: Diagnosis not present

## 2016-05-28 DIAGNOSIS — N2581 Secondary hyperparathyroidism of renal origin: Secondary | ICD-10-CM | POA: Diagnosis not present

## 2016-05-28 DIAGNOSIS — N186 End stage renal disease: Secondary | ICD-10-CM | POA: Diagnosis not present

## 2016-05-28 DIAGNOSIS — Z992 Dependence on renal dialysis: Secondary | ICD-10-CM | POA: Diagnosis not present

## 2016-05-28 DIAGNOSIS — D509 Iron deficiency anemia, unspecified: Secondary | ICD-10-CM | POA: Diagnosis not present

## 2016-05-30 DIAGNOSIS — Z992 Dependence on renal dialysis: Secondary | ICD-10-CM | POA: Diagnosis not present

## 2016-05-30 DIAGNOSIS — N2581 Secondary hyperparathyroidism of renal origin: Secondary | ICD-10-CM | POA: Diagnosis not present

## 2016-05-30 DIAGNOSIS — N186 End stage renal disease: Secondary | ICD-10-CM | POA: Diagnosis not present

## 2016-05-30 DIAGNOSIS — D631 Anemia in chronic kidney disease: Secondary | ICD-10-CM | POA: Diagnosis not present

## 2016-05-30 DIAGNOSIS — D509 Iron deficiency anemia, unspecified: Secondary | ICD-10-CM | POA: Diagnosis not present

## 2016-05-31 DIAGNOSIS — N19 Unspecified kidney failure: Secondary | ICD-10-CM | POA: Diagnosis not present

## 2016-05-31 DIAGNOSIS — Z992 Dependence on renal dialysis: Secondary | ICD-10-CM | POA: Diagnosis not present

## 2016-05-31 DIAGNOSIS — D509 Iron deficiency anemia, unspecified: Secondary | ICD-10-CM | POA: Diagnosis not present

## 2016-05-31 DIAGNOSIS — N2581 Secondary hyperparathyroidism of renal origin: Secondary | ICD-10-CM | POA: Diagnosis not present

## 2016-05-31 DIAGNOSIS — Z6827 Body mass index (BMI) 27.0-27.9, adult: Secondary | ICD-10-CM | POA: Diagnosis not present

## 2016-05-31 DIAGNOSIS — Z713 Dietary counseling and surveillance: Secondary | ICD-10-CM | POA: Diagnosis not present

## 2016-05-31 DIAGNOSIS — Z299 Encounter for prophylactic measures, unspecified: Secondary | ICD-10-CM | POA: Diagnosis not present

## 2016-05-31 DIAGNOSIS — D631 Anemia in chronic kidney disease: Secondary | ICD-10-CM | POA: Diagnosis not present

## 2016-05-31 DIAGNOSIS — E1122 Type 2 diabetes mellitus with diabetic chronic kidney disease: Secondary | ICD-10-CM | POA: Diagnosis not present

## 2016-05-31 DIAGNOSIS — N186 End stage renal disease: Secondary | ICD-10-CM | POA: Diagnosis not present

## 2016-06-01 DIAGNOSIS — D631 Anemia in chronic kidney disease: Secondary | ICD-10-CM | POA: Diagnosis not present

## 2016-06-01 DIAGNOSIS — Z992 Dependence on renal dialysis: Secondary | ICD-10-CM | POA: Diagnosis not present

## 2016-06-01 DIAGNOSIS — D509 Iron deficiency anemia, unspecified: Secondary | ICD-10-CM | POA: Diagnosis not present

## 2016-06-01 DIAGNOSIS — N186 End stage renal disease: Secondary | ICD-10-CM | POA: Diagnosis not present

## 2016-06-01 DIAGNOSIS — N2581 Secondary hyperparathyroidism of renal origin: Secondary | ICD-10-CM | POA: Diagnosis not present

## 2016-06-04 DIAGNOSIS — D631 Anemia in chronic kidney disease: Secondary | ICD-10-CM | POA: Diagnosis not present

## 2016-06-04 DIAGNOSIS — R4182 Altered mental status, unspecified: Secondary | ICD-10-CM | POA: Diagnosis not present

## 2016-06-04 DIAGNOSIS — R918 Other nonspecific abnormal finding of lung field: Secondary | ICD-10-CM | POA: Diagnosis not present

## 2016-06-04 DIAGNOSIS — E1129 Type 2 diabetes mellitus with other diabetic kidney complication: Secondary | ICD-10-CM | POA: Diagnosis not present

## 2016-06-04 DIAGNOSIS — I120 Hypertensive chronic kidney disease with stage 5 chronic kidney disease or end stage renal disease: Secondary | ICD-10-CM | POA: Diagnosis not present

## 2016-06-04 DIAGNOSIS — R8279 Other abnormal findings on microbiological examination of urine: Secondary | ICD-10-CM | POA: Diagnosis not present

## 2016-06-04 DIAGNOSIS — S0990XA Unspecified injury of head, initial encounter: Secondary | ICD-10-CM | POA: Diagnosis not present

## 2016-06-04 DIAGNOSIS — S3991XA Unspecified injury of abdomen, initial encounter: Secondary | ICD-10-CM | POA: Diagnosis not present

## 2016-06-04 DIAGNOSIS — Z992 Dependence on renal dialysis: Secondary | ICD-10-CM | POA: Diagnosis not present

## 2016-06-04 DIAGNOSIS — Z049 Encounter for examination and observation for unspecified reason: Secondary | ICD-10-CM | POA: Diagnosis not present

## 2016-06-04 DIAGNOSIS — E10649 Type 1 diabetes mellitus with hypoglycemia without coma: Secondary | ICD-10-CM | POA: Diagnosis not present

## 2016-06-04 DIAGNOSIS — Z8249 Family history of ischemic heart disease and other diseases of the circulatory system: Secondary | ICD-10-CM | POA: Diagnosis not present

## 2016-06-04 DIAGNOSIS — N186 End stage renal disease: Secondary | ICD-10-CM | POA: Diagnosis not present

## 2016-06-04 DIAGNOSIS — E162 Hypoglycemia, unspecified: Secondary | ICD-10-CM | POA: Diagnosis not present

## 2016-06-04 DIAGNOSIS — Z89421 Acquired absence of other right toe(s): Secondary | ICD-10-CM | POA: Diagnosis not present

## 2016-06-04 DIAGNOSIS — E785 Hyperlipidemia, unspecified: Secondary | ICD-10-CM | POA: Diagnosis not present

## 2016-06-04 DIAGNOSIS — Z794 Long term (current) use of insulin: Secondary | ICD-10-CM | POA: Diagnosis not present

## 2016-06-04 DIAGNOSIS — E11649 Type 2 diabetes mellitus with hypoglycemia without coma: Secondary | ICD-10-CM | POA: Diagnosis not present

## 2016-06-04 DIAGNOSIS — S199XXA Unspecified injury of neck, initial encounter: Secondary | ICD-10-CM | POA: Diagnosis not present

## 2016-06-04 DIAGNOSIS — I739 Peripheral vascular disease, unspecified: Secondary | ICD-10-CM | POA: Diagnosis not present

## 2016-06-04 DIAGNOSIS — Z79899 Other long term (current) drug therapy: Secondary | ICD-10-CM | POA: Diagnosis not present

## 2016-06-04 DIAGNOSIS — E1122 Type 2 diabetes mellitus with diabetic chronic kidney disease: Secondary | ICD-10-CM | POA: Diagnosis not present

## 2016-06-05 DIAGNOSIS — I120 Hypertensive chronic kidney disease with stage 5 chronic kidney disease or end stage renal disease: Secondary | ICD-10-CM | POA: Diagnosis not present

## 2016-06-05 DIAGNOSIS — E1122 Type 2 diabetes mellitus with diabetic chronic kidney disease: Secondary | ICD-10-CM | POA: Diagnosis not present

## 2016-06-05 DIAGNOSIS — Z992 Dependence on renal dialysis: Secondary | ICD-10-CM | POA: Diagnosis not present

## 2016-06-05 DIAGNOSIS — R4182 Altered mental status, unspecified: Secondary | ICD-10-CM | POA: Diagnosis not present

## 2016-06-05 DIAGNOSIS — E11649 Type 2 diabetes mellitus with hypoglycemia without coma: Secondary | ICD-10-CM | POA: Diagnosis not present

## 2016-06-05 DIAGNOSIS — E1129 Type 2 diabetes mellitus with other diabetic kidney complication: Secondary | ICD-10-CM | POA: Diagnosis not present

## 2016-06-05 DIAGNOSIS — E162 Hypoglycemia, unspecified: Secondary | ICD-10-CM | POA: Diagnosis not present

## 2016-06-05 DIAGNOSIS — D631 Anemia in chronic kidney disease: Secondary | ICD-10-CM | POA: Diagnosis not present

## 2016-06-05 DIAGNOSIS — N186 End stage renal disease: Secondary | ICD-10-CM | POA: Diagnosis not present

## 2016-06-06 DIAGNOSIS — E1122 Type 2 diabetes mellitus with diabetic chronic kidney disease: Secondary | ICD-10-CM | POA: Diagnosis not present

## 2016-06-06 DIAGNOSIS — D631 Anemia in chronic kidney disease: Secondary | ICD-10-CM | POA: Diagnosis not present

## 2016-06-06 DIAGNOSIS — N186 End stage renal disease: Secondary | ICD-10-CM | POA: Diagnosis not present

## 2016-06-06 DIAGNOSIS — E11649 Type 2 diabetes mellitus with hypoglycemia without coma: Secondary | ICD-10-CM | POA: Diagnosis not present

## 2016-06-06 DIAGNOSIS — I120 Hypertensive chronic kidney disease with stage 5 chronic kidney disease or end stage renal disease: Secondary | ICD-10-CM | POA: Diagnosis not present

## 2016-06-06 DIAGNOSIS — R4182 Altered mental status, unspecified: Secondary | ICD-10-CM | POA: Diagnosis not present

## 2016-06-06 DIAGNOSIS — Z992 Dependence on renal dialysis: Secondary | ICD-10-CM | POA: Diagnosis not present

## 2016-06-06 DIAGNOSIS — E1129 Type 2 diabetes mellitus with other diabetic kidney complication: Secondary | ICD-10-CM | POA: Diagnosis not present

## 2016-06-08 DIAGNOSIS — N186 End stage renal disease: Secondary | ICD-10-CM | POA: Diagnosis not present

## 2016-06-08 DIAGNOSIS — Z992 Dependence on renal dialysis: Secondary | ICD-10-CM | POA: Diagnosis not present

## 2016-06-08 DIAGNOSIS — D631 Anemia in chronic kidney disease: Secondary | ICD-10-CM | POA: Diagnosis not present

## 2016-06-08 DIAGNOSIS — D509 Iron deficiency anemia, unspecified: Secondary | ICD-10-CM | POA: Diagnosis not present

## 2016-06-08 DIAGNOSIS — N2581 Secondary hyperparathyroidism of renal origin: Secondary | ICD-10-CM | POA: Diagnosis not present

## 2016-06-11 DIAGNOSIS — N186 End stage renal disease: Secondary | ICD-10-CM | POA: Diagnosis not present

## 2016-06-11 DIAGNOSIS — D509 Iron deficiency anemia, unspecified: Secondary | ICD-10-CM | POA: Diagnosis not present

## 2016-06-11 DIAGNOSIS — Z992 Dependence on renal dialysis: Secondary | ICD-10-CM | POA: Diagnosis not present

## 2016-06-11 DIAGNOSIS — D631 Anemia in chronic kidney disease: Secondary | ICD-10-CM | POA: Diagnosis not present

## 2016-06-11 DIAGNOSIS — N2581 Secondary hyperparathyroidism of renal origin: Secondary | ICD-10-CM | POA: Diagnosis not present

## 2016-06-13 DIAGNOSIS — Z789 Other specified health status: Secondary | ICD-10-CM | POA: Diagnosis not present

## 2016-06-13 DIAGNOSIS — I10 Essential (primary) hypertension: Secondary | ICD-10-CM | POA: Diagnosis not present

## 2016-06-13 DIAGNOSIS — N19 Unspecified kidney failure: Secondary | ICD-10-CM | POA: Diagnosis not present

## 2016-06-13 DIAGNOSIS — E1122 Type 2 diabetes mellitus with diabetic chronic kidney disease: Secondary | ICD-10-CM | POA: Diagnosis not present

## 2016-06-13 DIAGNOSIS — N186 End stage renal disease: Secondary | ICD-10-CM | POA: Diagnosis not present

## 2016-06-13 DIAGNOSIS — Z299 Encounter for prophylactic measures, unspecified: Secondary | ICD-10-CM | POA: Diagnosis not present

## 2016-06-13 DIAGNOSIS — E7800 Pure hypercholesterolemia, unspecified: Secondary | ICD-10-CM | POA: Diagnosis not present

## 2016-06-13 DIAGNOSIS — Z6827 Body mass index (BMI) 27.0-27.9, adult: Secondary | ICD-10-CM | POA: Diagnosis not present

## 2016-06-13 DIAGNOSIS — Z713 Dietary counseling and surveillance: Secondary | ICD-10-CM | POA: Diagnosis not present

## 2016-06-13 DIAGNOSIS — Z992 Dependence on renal dialysis: Secondary | ICD-10-CM | POA: Diagnosis not present

## 2016-06-14 DIAGNOSIS — D6959 Other secondary thrombocytopenia: Secondary | ICD-10-CM | POA: Diagnosis not present

## 2016-06-14 DIAGNOSIS — N2581 Secondary hyperparathyroidism of renal origin: Secondary | ICD-10-CM | POA: Diagnosis not present

## 2016-06-14 DIAGNOSIS — N186 End stage renal disease: Secondary | ICD-10-CM | POA: Diagnosis not present

## 2016-06-14 DIAGNOSIS — E1129 Type 2 diabetes mellitus with other diabetic kidney complication: Secondary | ICD-10-CM | POA: Diagnosis not present

## 2016-06-14 DIAGNOSIS — S98131A Complete traumatic amputation of one right lesser toe, initial encounter: Secondary | ICD-10-CM | POA: Diagnosis not present

## 2016-06-14 DIAGNOSIS — D631 Anemia in chronic kidney disease: Secondary | ICD-10-CM | POA: Diagnosis not present

## 2016-06-14 DIAGNOSIS — E784 Other hyperlipidemia: Secondary | ICD-10-CM | POA: Diagnosis not present

## 2016-06-14 DIAGNOSIS — Z139 Encounter for screening, unspecified: Secondary | ICD-10-CM | POA: Diagnosis not present

## 2016-06-14 DIAGNOSIS — Z23 Encounter for immunization: Secondary | ICD-10-CM | POA: Diagnosis not present

## 2016-06-14 DIAGNOSIS — D500 Iron deficiency anemia secondary to blood loss (chronic): Secondary | ICD-10-CM | POA: Diagnosis not present

## 2016-06-16 DIAGNOSIS — D6959 Other secondary thrombocytopenia: Secondary | ICD-10-CM | POA: Diagnosis not present

## 2016-06-16 DIAGNOSIS — S98131A Complete traumatic amputation of one right lesser toe, initial encounter: Secondary | ICD-10-CM | POA: Diagnosis not present

## 2016-06-16 DIAGNOSIS — D631 Anemia in chronic kidney disease: Secondary | ICD-10-CM | POA: Diagnosis not present

## 2016-06-16 DIAGNOSIS — D500 Iron deficiency anemia secondary to blood loss (chronic): Secondary | ICD-10-CM | POA: Diagnosis not present

## 2016-06-16 DIAGNOSIS — N2581 Secondary hyperparathyroidism of renal origin: Secondary | ICD-10-CM | POA: Diagnosis not present

## 2016-06-16 DIAGNOSIS — N186 End stage renal disease: Secondary | ICD-10-CM | POA: Diagnosis not present

## 2016-06-19 DIAGNOSIS — N2581 Secondary hyperparathyroidism of renal origin: Secondary | ICD-10-CM | POA: Diagnosis not present

## 2016-06-19 DIAGNOSIS — N186 End stage renal disease: Secondary | ICD-10-CM | POA: Diagnosis not present

## 2016-06-19 DIAGNOSIS — D6959 Other secondary thrombocytopenia: Secondary | ICD-10-CM | POA: Diagnosis not present

## 2016-06-19 DIAGNOSIS — D631 Anemia in chronic kidney disease: Secondary | ICD-10-CM | POA: Diagnosis not present

## 2016-06-19 DIAGNOSIS — S98131A Complete traumatic amputation of one right lesser toe, initial encounter: Secondary | ICD-10-CM | POA: Diagnosis not present

## 2016-06-19 DIAGNOSIS — D500 Iron deficiency anemia secondary to blood loss (chronic): Secondary | ICD-10-CM | POA: Diagnosis not present

## 2016-06-20 DIAGNOSIS — E7800 Pure hypercholesterolemia, unspecified: Secondary | ICD-10-CM | POA: Diagnosis not present

## 2016-06-20 DIAGNOSIS — Z713 Dietary counseling and surveillance: Secondary | ICD-10-CM | POA: Diagnosis not present

## 2016-06-20 DIAGNOSIS — R972 Elevated prostate specific antigen [PSA]: Secondary | ICD-10-CM | POA: Diagnosis not present

## 2016-06-20 DIAGNOSIS — I10 Essential (primary) hypertension: Secondary | ICD-10-CM | POA: Diagnosis not present

## 2016-06-20 DIAGNOSIS — Z299 Encounter for prophylactic measures, unspecified: Secondary | ICD-10-CM | POA: Diagnosis not present

## 2016-06-20 DIAGNOSIS — E1122 Type 2 diabetes mellitus with diabetic chronic kidney disease: Secondary | ICD-10-CM | POA: Diagnosis not present

## 2016-06-20 DIAGNOSIS — N19 Unspecified kidney failure: Secondary | ICD-10-CM | POA: Diagnosis not present

## 2016-06-20 DIAGNOSIS — Z992 Dependence on renal dialysis: Secondary | ICD-10-CM | POA: Diagnosis not present

## 2016-06-20 DIAGNOSIS — Z6826 Body mass index (BMI) 26.0-26.9, adult: Secondary | ICD-10-CM | POA: Diagnosis not present

## 2016-06-20 DIAGNOSIS — J61 Pneumoconiosis due to asbestos and other mineral fibers: Secondary | ICD-10-CM | POA: Diagnosis not present

## 2016-06-20 DIAGNOSIS — N186 End stage renal disease: Secondary | ICD-10-CM | POA: Diagnosis not present

## 2016-06-21 DIAGNOSIS — N2581 Secondary hyperparathyroidism of renal origin: Secondary | ICD-10-CM | POA: Diagnosis not present

## 2016-06-21 DIAGNOSIS — D6959 Other secondary thrombocytopenia: Secondary | ICD-10-CM | POA: Diagnosis not present

## 2016-06-21 DIAGNOSIS — S98131A Complete traumatic amputation of one right lesser toe, initial encounter: Secondary | ICD-10-CM | POA: Diagnosis not present

## 2016-06-21 DIAGNOSIS — D631 Anemia in chronic kidney disease: Secondary | ICD-10-CM | POA: Diagnosis not present

## 2016-06-21 DIAGNOSIS — N186 End stage renal disease: Secondary | ICD-10-CM | POA: Diagnosis not present

## 2016-06-21 DIAGNOSIS — D500 Iron deficiency anemia secondary to blood loss (chronic): Secondary | ICD-10-CM | POA: Diagnosis not present

## 2016-06-23 DIAGNOSIS — D500 Iron deficiency anemia secondary to blood loss (chronic): Secondary | ICD-10-CM | POA: Diagnosis not present

## 2016-06-23 DIAGNOSIS — N186 End stage renal disease: Secondary | ICD-10-CM | POA: Diagnosis not present

## 2016-06-23 DIAGNOSIS — D631 Anemia in chronic kidney disease: Secondary | ICD-10-CM | POA: Diagnosis not present

## 2016-06-23 DIAGNOSIS — S98131A Complete traumatic amputation of one right lesser toe, initial encounter: Secondary | ICD-10-CM | POA: Diagnosis not present

## 2016-06-23 DIAGNOSIS — D6959 Other secondary thrombocytopenia: Secondary | ICD-10-CM | POA: Diagnosis not present

## 2016-06-23 DIAGNOSIS — N2581 Secondary hyperparathyroidism of renal origin: Secondary | ICD-10-CM | POA: Diagnosis not present

## 2016-06-26 DIAGNOSIS — N2581 Secondary hyperparathyroidism of renal origin: Secondary | ICD-10-CM | POA: Diagnosis not present

## 2016-06-26 DIAGNOSIS — D6959 Other secondary thrombocytopenia: Secondary | ICD-10-CM | POA: Diagnosis not present

## 2016-06-26 DIAGNOSIS — D500 Iron deficiency anemia secondary to blood loss (chronic): Secondary | ICD-10-CM | POA: Diagnosis not present

## 2016-06-26 DIAGNOSIS — D631 Anemia in chronic kidney disease: Secondary | ICD-10-CM | POA: Diagnosis not present

## 2016-06-26 DIAGNOSIS — N186 End stage renal disease: Secondary | ICD-10-CM | POA: Diagnosis not present

## 2016-06-26 DIAGNOSIS — S98131A Complete traumatic amputation of one right lesser toe, initial encounter: Secondary | ICD-10-CM | POA: Diagnosis not present

## 2016-06-27 DIAGNOSIS — D631 Anemia in chronic kidney disease: Secondary | ICD-10-CM | POA: Diagnosis not present

## 2016-06-27 DIAGNOSIS — N2581 Secondary hyperparathyroidism of renal origin: Secondary | ICD-10-CM | POA: Diagnosis not present

## 2016-06-27 DIAGNOSIS — D509 Iron deficiency anemia, unspecified: Secondary | ICD-10-CM | POA: Diagnosis not present

## 2016-06-27 DIAGNOSIS — Z992 Dependence on renal dialysis: Secondary | ICD-10-CM | POA: Diagnosis not present

## 2016-06-27 DIAGNOSIS — N186 End stage renal disease: Secondary | ICD-10-CM | POA: Diagnosis not present

## 2016-06-28 DIAGNOSIS — D6959 Other secondary thrombocytopenia: Secondary | ICD-10-CM | POA: Diagnosis not present

## 2016-06-28 DIAGNOSIS — N186 End stage renal disease: Secondary | ICD-10-CM | POA: Diagnosis not present

## 2016-06-28 DIAGNOSIS — D631 Anemia in chronic kidney disease: Secondary | ICD-10-CM | POA: Diagnosis not present

## 2016-06-28 DIAGNOSIS — S98131A Complete traumatic amputation of one right lesser toe, initial encounter: Secondary | ICD-10-CM | POA: Diagnosis not present

## 2016-06-28 DIAGNOSIS — N2581 Secondary hyperparathyroidism of renal origin: Secondary | ICD-10-CM | POA: Diagnosis not present

## 2016-06-28 DIAGNOSIS — D500 Iron deficiency anemia secondary to blood loss (chronic): Secondary | ICD-10-CM | POA: Diagnosis not present

## 2016-06-29 DIAGNOSIS — N2581 Secondary hyperparathyroidism of renal origin: Secondary | ICD-10-CM | POA: Diagnosis not present

## 2016-06-29 DIAGNOSIS — Z992 Dependence on renal dialysis: Secondary | ICD-10-CM | POA: Diagnosis not present

## 2016-06-29 DIAGNOSIS — D631 Anemia in chronic kidney disease: Secondary | ICD-10-CM | POA: Diagnosis not present

## 2016-06-29 DIAGNOSIS — D509 Iron deficiency anemia, unspecified: Secondary | ICD-10-CM | POA: Diagnosis not present

## 2016-06-29 DIAGNOSIS — N186 End stage renal disease: Secondary | ICD-10-CM | POA: Diagnosis not present

## 2016-06-30 DIAGNOSIS — N186 End stage renal disease: Secondary | ICD-10-CM | POA: Diagnosis not present

## 2016-06-30 DIAGNOSIS — S98131A Complete traumatic amputation of one right lesser toe, initial encounter: Secondary | ICD-10-CM | POA: Diagnosis not present

## 2016-06-30 DIAGNOSIS — D500 Iron deficiency anemia secondary to blood loss (chronic): Secondary | ICD-10-CM | POA: Diagnosis not present

## 2016-06-30 DIAGNOSIS — D631 Anemia in chronic kidney disease: Secondary | ICD-10-CM | POA: Diagnosis not present

## 2016-06-30 DIAGNOSIS — D6959 Other secondary thrombocytopenia: Secondary | ICD-10-CM | POA: Diagnosis not present

## 2016-06-30 DIAGNOSIS — N2581 Secondary hyperparathyroidism of renal origin: Secondary | ICD-10-CM | POA: Diagnosis not present

## 2016-06-30 DIAGNOSIS — Z992 Dependence on renal dialysis: Secondary | ICD-10-CM | POA: Diagnosis not present

## 2016-07-03 DIAGNOSIS — D631 Anemia in chronic kidney disease: Secondary | ICD-10-CM | POA: Diagnosis not present

## 2016-07-03 DIAGNOSIS — S98131A Complete traumatic amputation of one right lesser toe, initial encounter: Secondary | ICD-10-CM | POA: Diagnosis not present

## 2016-07-03 DIAGNOSIS — N2581 Secondary hyperparathyroidism of renal origin: Secondary | ICD-10-CM | POA: Diagnosis not present

## 2016-07-03 DIAGNOSIS — N186 End stage renal disease: Secondary | ICD-10-CM | POA: Diagnosis not present

## 2016-07-03 DIAGNOSIS — E8809 Other disorders of plasma-protein metabolism, not elsewhere classified: Secondary | ICD-10-CM | POA: Diagnosis not present

## 2016-07-03 DIAGNOSIS — D500 Iron deficiency anemia secondary to blood loss (chronic): Secondary | ICD-10-CM | POA: Diagnosis not present

## 2016-07-05 DIAGNOSIS — E8809 Other disorders of plasma-protein metabolism, not elsewhere classified: Secondary | ICD-10-CM | POA: Diagnosis not present

## 2016-07-05 DIAGNOSIS — N2581 Secondary hyperparathyroidism of renal origin: Secondary | ICD-10-CM | POA: Diagnosis not present

## 2016-07-05 DIAGNOSIS — D631 Anemia in chronic kidney disease: Secondary | ICD-10-CM | POA: Diagnosis not present

## 2016-07-05 DIAGNOSIS — N186 End stage renal disease: Secondary | ICD-10-CM | POA: Diagnosis not present

## 2016-07-05 DIAGNOSIS — D500 Iron deficiency anemia secondary to blood loss (chronic): Secondary | ICD-10-CM | POA: Diagnosis not present

## 2016-07-05 DIAGNOSIS — S98131A Complete traumatic amputation of one right lesser toe, initial encounter: Secondary | ICD-10-CM | POA: Diagnosis not present

## 2016-07-07 DIAGNOSIS — N186 End stage renal disease: Secondary | ICD-10-CM | POA: Diagnosis not present

## 2016-07-07 DIAGNOSIS — E8809 Other disorders of plasma-protein metabolism, not elsewhere classified: Secondary | ICD-10-CM | POA: Diagnosis not present

## 2016-07-07 DIAGNOSIS — S98131A Complete traumatic amputation of one right lesser toe, initial encounter: Secondary | ICD-10-CM | POA: Diagnosis not present

## 2016-07-07 DIAGNOSIS — D631 Anemia in chronic kidney disease: Secondary | ICD-10-CM | POA: Diagnosis not present

## 2016-07-07 DIAGNOSIS — D500 Iron deficiency anemia secondary to blood loss (chronic): Secondary | ICD-10-CM | POA: Diagnosis not present

## 2016-07-07 DIAGNOSIS — N2581 Secondary hyperparathyroidism of renal origin: Secondary | ICD-10-CM | POA: Diagnosis not present

## 2016-07-10 DIAGNOSIS — D631 Anemia in chronic kidney disease: Secondary | ICD-10-CM | POA: Diagnosis not present

## 2016-07-10 DIAGNOSIS — S98131A Complete traumatic amputation of one right lesser toe, initial encounter: Secondary | ICD-10-CM | POA: Diagnosis not present

## 2016-07-10 DIAGNOSIS — N186 End stage renal disease: Secondary | ICD-10-CM | POA: Diagnosis not present

## 2016-07-10 DIAGNOSIS — N2581 Secondary hyperparathyroidism of renal origin: Secondary | ICD-10-CM | POA: Diagnosis not present

## 2016-07-10 DIAGNOSIS — E8809 Other disorders of plasma-protein metabolism, not elsewhere classified: Secondary | ICD-10-CM | POA: Diagnosis not present

## 2016-07-10 DIAGNOSIS — D500 Iron deficiency anemia secondary to blood loss (chronic): Secondary | ICD-10-CM | POA: Diagnosis not present

## 2016-07-12 DIAGNOSIS — D500 Iron deficiency anemia secondary to blood loss (chronic): Secondary | ICD-10-CM | POA: Diagnosis not present

## 2016-07-12 DIAGNOSIS — N186 End stage renal disease: Secondary | ICD-10-CM | POA: Diagnosis not present

## 2016-07-12 DIAGNOSIS — D631 Anemia in chronic kidney disease: Secondary | ICD-10-CM | POA: Diagnosis not present

## 2016-07-12 DIAGNOSIS — N2581 Secondary hyperparathyroidism of renal origin: Secondary | ICD-10-CM | POA: Diagnosis not present

## 2016-07-12 DIAGNOSIS — E8809 Other disorders of plasma-protein metabolism, not elsewhere classified: Secondary | ICD-10-CM | POA: Diagnosis not present

## 2016-07-12 DIAGNOSIS — S98131A Complete traumatic amputation of one right lesser toe, initial encounter: Secondary | ICD-10-CM | POA: Diagnosis not present

## 2016-07-13 DIAGNOSIS — I10 Essential (primary) hypertension: Secondary | ICD-10-CM | POA: Diagnosis not present

## 2016-07-13 DIAGNOSIS — E119 Type 2 diabetes mellitus without complications: Secondary | ICD-10-CM | POA: Diagnosis not present

## 2016-07-14 DIAGNOSIS — E8809 Other disorders of plasma-protein metabolism, not elsewhere classified: Secondary | ICD-10-CM | POA: Diagnosis not present

## 2016-07-14 DIAGNOSIS — S98131A Complete traumatic amputation of one right lesser toe, initial encounter: Secondary | ICD-10-CM | POA: Diagnosis not present

## 2016-07-14 DIAGNOSIS — N186 End stage renal disease: Secondary | ICD-10-CM | POA: Diagnosis not present

## 2016-07-14 DIAGNOSIS — D500 Iron deficiency anemia secondary to blood loss (chronic): Secondary | ICD-10-CM | POA: Diagnosis not present

## 2016-07-14 DIAGNOSIS — N2581 Secondary hyperparathyroidism of renal origin: Secondary | ICD-10-CM | POA: Diagnosis not present

## 2016-07-14 DIAGNOSIS — D631 Anemia in chronic kidney disease: Secondary | ICD-10-CM | POA: Diagnosis not present

## 2016-07-17 DIAGNOSIS — S98131A Complete traumatic amputation of one right lesser toe, initial encounter: Secondary | ICD-10-CM | POA: Diagnosis not present

## 2016-07-17 DIAGNOSIS — N186 End stage renal disease: Secondary | ICD-10-CM | POA: Diagnosis not present

## 2016-07-17 DIAGNOSIS — N2581 Secondary hyperparathyroidism of renal origin: Secondary | ICD-10-CM | POA: Diagnosis not present

## 2016-07-17 DIAGNOSIS — D500 Iron deficiency anemia secondary to blood loss (chronic): Secondary | ICD-10-CM | POA: Diagnosis not present

## 2016-07-17 DIAGNOSIS — E8809 Other disorders of plasma-protein metabolism, not elsewhere classified: Secondary | ICD-10-CM | POA: Diagnosis not present

## 2016-07-17 DIAGNOSIS — D631 Anemia in chronic kidney disease: Secondary | ICD-10-CM | POA: Diagnosis not present

## 2016-07-19 DIAGNOSIS — S98131A Complete traumatic amputation of one right lesser toe, initial encounter: Secondary | ICD-10-CM | POA: Diagnosis not present

## 2016-07-19 DIAGNOSIS — N186 End stage renal disease: Secondary | ICD-10-CM | POA: Diagnosis not present

## 2016-07-19 DIAGNOSIS — E8809 Other disorders of plasma-protein metabolism, not elsewhere classified: Secondary | ICD-10-CM | POA: Diagnosis not present

## 2016-07-19 DIAGNOSIS — N2581 Secondary hyperparathyroidism of renal origin: Secondary | ICD-10-CM | POA: Diagnosis not present

## 2016-07-19 DIAGNOSIS — D631 Anemia in chronic kidney disease: Secondary | ICD-10-CM | POA: Diagnosis not present

## 2016-07-19 DIAGNOSIS — D500 Iron deficiency anemia secondary to blood loss (chronic): Secondary | ICD-10-CM | POA: Diagnosis not present

## 2016-07-21 DIAGNOSIS — D500 Iron deficiency anemia secondary to blood loss (chronic): Secondary | ICD-10-CM | POA: Diagnosis not present

## 2016-07-21 DIAGNOSIS — E8809 Other disorders of plasma-protein metabolism, not elsewhere classified: Secondary | ICD-10-CM | POA: Diagnosis not present

## 2016-07-21 DIAGNOSIS — N2581 Secondary hyperparathyroidism of renal origin: Secondary | ICD-10-CM | POA: Diagnosis not present

## 2016-07-21 DIAGNOSIS — S98131A Complete traumatic amputation of one right lesser toe, initial encounter: Secondary | ICD-10-CM | POA: Diagnosis not present

## 2016-07-21 DIAGNOSIS — D631 Anemia in chronic kidney disease: Secondary | ICD-10-CM | POA: Diagnosis not present

## 2016-07-21 DIAGNOSIS — N186 End stage renal disease: Secondary | ICD-10-CM | POA: Diagnosis not present

## 2016-07-24 DIAGNOSIS — S98131A Complete traumatic amputation of one right lesser toe, initial encounter: Secondary | ICD-10-CM | POA: Diagnosis not present

## 2016-07-24 DIAGNOSIS — E8809 Other disorders of plasma-protein metabolism, not elsewhere classified: Secondary | ICD-10-CM | POA: Diagnosis not present

## 2016-07-24 DIAGNOSIS — N186 End stage renal disease: Secondary | ICD-10-CM | POA: Diagnosis not present

## 2016-07-24 DIAGNOSIS — D631 Anemia in chronic kidney disease: Secondary | ICD-10-CM | POA: Diagnosis not present

## 2016-07-24 DIAGNOSIS — N2581 Secondary hyperparathyroidism of renal origin: Secondary | ICD-10-CM | POA: Diagnosis not present

## 2016-07-24 DIAGNOSIS — D500 Iron deficiency anemia secondary to blood loss (chronic): Secondary | ICD-10-CM | POA: Diagnosis not present

## 2016-07-26 DIAGNOSIS — D500 Iron deficiency anemia secondary to blood loss (chronic): Secondary | ICD-10-CM | POA: Diagnosis not present

## 2016-07-26 DIAGNOSIS — D631 Anemia in chronic kidney disease: Secondary | ICD-10-CM | POA: Diagnosis not present

## 2016-07-26 DIAGNOSIS — S98131A Complete traumatic amputation of one right lesser toe, initial encounter: Secondary | ICD-10-CM | POA: Diagnosis not present

## 2016-07-26 DIAGNOSIS — E8809 Other disorders of plasma-protein metabolism, not elsewhere classified: Secondary | ICD-10-CM | POA: Diagnosis not present

## 2016-07-26 DIAGNOSIS — N186 End stage renal disease: Secondary | ICD-10-CM | POA: Diagnosis not present

## 2016-07-26 DIAGNOSIS — N2581 Secondary hyperparathyroidism of renal origin: Secondary | ICD-10-CM | POA: Diagnosis not present

## 2016-07-28 DIAGNOSIS — N2581 Secondary hyperparathyroidism of renal origin: Secondary | ICD-10-CM | POA: Diagnosis not present

## 2016-07-28 DIAGNOSIS — E8809 Other disorders of plasma-protein metabolism, not elsewhere classified: Secondary | ICD-10-CM | POA: Diagnosis not present

## 2016-07-28 DIAGNOSIS — N186 End stage renal disease: Secondary | ICD-10-CM | POA: Diagnosis not present

## 2016-07-28 DIAGNOSIS — D500 Iron deficiency anemia secondary to blood loss (chronic): Secondary | ICD-10-CM | POA: Diagnosis not present

## 2016-07-28 DIAGNOSIS — D631 Anemia in chronic kidney disease: Secondary | ICD-10-CM | POA: Diagnosis not present

## 2016-07-28 DIAGNOSIS — S98131A Complete traumatic amputation of one right lesser toe, initial encounter: Secondary | ICD-10-CM | POA: Diagnosis not present

## 2016-07-30 DIAGNOSIS — Z992 Dependence on renal dialysis: Secondary | ICD-10-CM | POA: Diagnosis not present

## 2016-07-30 DIAGNOSIS — N186 End stage renal disease: Secondary | ICD-10-CM | POA: Diagnosis not present

## 2016-07-31 DIAGNOSIS — N186 End stage renal disease: Secondary | ICD-10-CM | POA: Diagnosis not present

## 2016-07-31 DIAGNOSIS — D500 Iron deficiency anemia secondary to blood loss (chronic): Secondary | ICD-10-CM | POA: Diagnosis not present

## 2016-07-31 DIAGNOSIS — D631 Anemia in chronic kidney disease: Secondary | ICD-10-CM | POA: Diagnosis not present

## 2016-07-31 DIAGNOSIS — S98131A Complete traumatic amputation of one right lesser toe, initial encounter: Secondary | ICD-10-CM | POA: Diagnosis not present

## 2016-07-31 DIAGNOSIS — E8809 Other disorders of plasma-protein metabolism, not elsewhere classified: Secondary | ICD-10-CM | POA: Diagnosis not present

## 2016-07-31 DIAGNOSIS — N2581 Secondary hyperparathyroidism of renal origin: Secondary | ICD-10-CM | POA: Diagnosis not present

## 2016-08-01 DIAGNOSIS — E119 Type 2 diabetes mellitus without complications: Secondary | ICD-10-CM | POA: Diagnosis not present

## 2016-08-01 DIAGNOSIS — I10 Essential (primary) hypertension: Secondary | ICD-10-CM | POA: Diagnosis not present

## 2016-08-02 DIAGNOSIS — N186 End stage renal disease: Secondary | ICD-10-CM | POA: Diagnosis not present

## 2016-08-02 DIAGNOSIS — S98131A Complete traumatic amputation of one right lesser toe, initial encounter: Secondary | ICD-10-CM | POA: Diagnosis not present

## 2016-08-02 DIAGNOSIS — D500 Iron deficiency anemia secondary to blood loss (chronic): Secondary | ICD-10-CM | POA: Diagnosis not present

## 2016-08-02 DIAGNOSIS — D631 Anemia in chronic kidney disease: Secondary | ICD-10-CM | POA: Diagnosis not present

## 2016-08-02 DIAGNOSIS — N2581 Secondary hyperparathyroidism of renal origin: Secondary | ICD-10-CM | POA: Diagnosis not present

## 2016-08-04 DIAGNOSIS — N2581 Secondary hyperparathyroidism of renal origin: Secondary | ICD-10-CM | POA: Diagnosis not present

## 2016-08-04 DIAGNOSIS — S98131A Complete traumatic amputation of one right lesser toe, initial encounter: Secondary | ICD-10-CM | POA: Diagnosis not present

## 2016-08-04 DIAGNOSIS — D631 Anemia in chronic kidney disease: Secondary | ICD-10-CM | POA: Diagnosis not present

## 2016-08-04 DIAGNOSIS — D500 Iron deficiency anemia secondary to blood loss (chronic): Secondary | ICD-10-CM | POA: Diagnosis not present

## 2016-08-04 DIAGNOSIS — N186 End stage renal disease: Secondary | ICD-10-CM | POA: Diagnosis not present

## 2016-08-07 DIAGNOSIS — N2581 Secondary hyperparathyroidism of renal origin: Secondary | ICD-10-CM | POA: Diagnosis not present

## 2016-08-07 DIAGNOSIS — S98131A Complete traumatic amputation of one right lesser toe, initial encounter: Secondary | ICD-10-CM | POA: Diagnosis not present

## 2016-08-07 DIAGNOSIS — D500 Iron deficiency anemia secondary to blood loss (chronic): Secondary | ICD-10-CM | POA: Diagnosis not present

## 2016-08-07 DIAGNOSIS — N186 End stage renal disease: Secondary | ICD-10-CM | POA: Diagnosis not present

## 2016-08-07 DIAGNOSIS — D631 Anemia in chronic kidney disease: Secondary | ICD-10-CM | POA: Diagnosis not present

## 2016-08-09 DIAGNOSIS — N186 End stage renal disease: Secondary | ICD-10-CM | POA: Diagnosis not present

## 2016-08-09 DIAGNOSIS — D500 Iron deficiency anemia secondary to blood loss (chronic): Secondary | ICD-10-CM | POA: Diagnosis not present

## 2016-08-09 DIAGNOSIS — N2581 Secondary hyperparathyroidism of renal origin: Secondary | ICD-10-CM | POA: Diagnosis not present

## 2016-08-09 DIAGNOSIS — S98131A Complete traumatic amputation of one right lesser toe, initial encounter: Secondary | ICD-10-CM | POA: Diagnosis not present

## 2016-08-09 DIAGNOSIS — D631 Anemia in chronic kidney disease: Secondary | ICD-10-CM | POA: Diagnosis not present

## 2016-08-11 DIAGNOSIS — N2581 Secondary hyperparathyroidism of renal origin: Secondary | ICD-10-CM | POA: Diagnosis not present

## 2016-08-11 DIAGNOSIS — D631 Anemia in chronic kidney disease: Secondary | ICD-10-CM | POA: Diagnosis not present

## 2016-08-11 DIAGNOSIS — S98131A Complete traumatic amputation of one right lesser toe, initial encounter: Secondary | ICD-10-CM | POA: Diagnosis not present

## 2016-08-11 DIAGNOSIS — D500 Iron deficiency anemia secondary to blood loss (chronic): Secondary | ICD-10-CM | POA: Diagnosis not present

## 2016-08-11 DIAGNOSIS — N186 End stage renal disease: Secondary | ICD-10-CM | POA: Diagnosis not present

## 2016-08-14 DIAGNOSIS — D500 Iron deficiency anemia secondary to blood loss (chronic): Secondary | ICD-10-CM | POA: Diagnosis not present

## 2016-08-14 DIAGNOSIS — S98131A Complete traumatic amputation of one right lesser toe, initial encounter: Secondary | ICD-10-CM | POA: Diagnosis not present

## 2016-08-14 DIAGNOSIS — D631 Anemia in chronic kidney disease: Secondary | ICD-10-CM | POA: Diagnosis not present

## 2016-08-14 DIAGNOSIS — N2581 Secondary hyperparathyroidism of renal origin: Secondary | ICD-10-CM | POA: Diagnosis not present

## 2016-08-14 DIAGNOSIS — N186 End stage renal disease: Secondary | ICD-10-CM | POA: Diagnosis not present

## 2016-08-16 DIAGNOSIS — D631 Anemia in chronic kidney disease: Secondary | ICD-10-CM | POA: Diagnosis not present

## 2016-08-16 DIAGNOSIS — D500 Iron deficiency anemia secondary to blood loss (chronic): Secondary | ICD-10-CM | POA: Diagnosis not present

## 2016-08-16 DIAGNOSIS — N186 End stage renal disease: Secondary | ICD-10-CM | POA: Diagnosis not present

## 2016-08-16 DIAGNOSIS — N2581 Secondary hyperparathyroidism of renal origin: Secondary | ICD-10-CM | POA: Diagnosis not present

## 2016-08-16 DIAGNOSIS — S98131A Complete traumatic amputation of one right lesser toe, initial encounter: Secondary | ICD-10-CM | POA: Diagnosis not present

## 2016-08-18 DIAGNOSIS — D500 Iron deficiency anemia secondary to blood loss (chronic): Secondary | ICD-10-CM | POA: Diagnosis not present

## 2016-08-18 DIAGNOSIS — N2581 Secondary hyperparathyroidism of renal origin: Secondary | ICD-10-CM | POA: Diagnosis not present

## 2016-08-18 DIAGNOSIS — D631 Anemia in chronic kidney disease: Secondary | ICD-10-CM | POA: Diagnosis not present

## 2016-08-18 DIAGNOSIS — N186 End stage renal disease: Secondary | ICD-10-CM | POA: Diagnosis not present

## 2016-08-18 DIAGNOSIS — S98131A Complete traumatic amputation of one right lesser toe, initial encounter: Secondary | ICD-10-CM | POA: Diagnosis not present

## 2016-08-21 DIAGNOSIS — N186 End stage renal disease: Secondary | ICD-10-CM | POA: Diagnosis not present

## 2016-08-21 DIAGNOSIS — D500 Iron deficiency anemia secondary to blood loss (chronic): Secondary | ICD-10-CM | POA: Diagnosis not present

## 2016-08-21 DIAGNOSIS — N2581 Secondary hyperparathyroidism of renal origin: Secondary | ICD-10-CM | POA: Diagnosis not present

## 2016-08-21 DIAGNOSIS — D631 Anemia in chronic kidney disease: Secondary | ICD-10-CM | POA: Diagnosis not present

## 2016-08-21 DIAGNOSIS — S98131A Complete traumatic amputation of one right lesser toe, initial encounter: Secondary | ICD-10-CM | POA: Diagnosis not present

## 2016-08-23 DIAGNOSIS — N2581 Secondary hyperparathyroidism of renal origin: Secondary | ICD-10-CM | POA: Diagnosis not present

## 2016-08-23 DIAGNOSIS — D500 Iron deficiency anemia secondary to blood loss (chronic): Secondary | ICD-10-CM | POA: Diagnosis not present

## 2016-08-23 DIAGNOSIS — N186 End stage renal disease: Secondary | ICD-10-CM | POA: Diagnosis not present

## 2016-08-23 DIAGNOSIS — D631 Anemia in chronic kidney disease: Secondary | ICD-10-CM | POA: Diagnosis not present

## 2016-08-23 DIAGNOSIS — S98131A Complete traumatic amputation of one right lesser toe, initial encounter: Secondary | ICD-10-CM | POA: Diagnosis not present

## 2016-08-25 DIAGNOSIS — N186 End stage renal disease: Secondary | ICD-10-CM | POA: Diagnosis not present

## 2016-08-25 DIAGNOSIS — D631 Anemia in chronic kidney disease: Secondary | ICD-10-CM | POA: Diagnosis not present

## 2016-08-25 DIAGNOSIS — S98131A Complete traumatic amputation of one right lesser toe, initial encounter: Secondary | ICD-10-CM | POA: Diagnosis not present

## 2016-08-25 DIAGNOSIS — D500 Iron deficiency anemia secondary to blood loss (chronic): Secondary | ICD-10-CM | POA: Diagnosis not present

## 2016-08-25 DIAGNOSIS — N2581 Secondary hyperparathyroidism of renal origin: Secondary | ICD-10-CM | POA: Diagnosis not present

## 2016-08-28 DIAGNOSIS — N186 End stage renal disease: Secondary | ICD-10-CM | POA: Diagnosis not present

## 2016-08-28 DIAGNOSIS — N2581 Secondary hyperparathyroidism of renal origin: Secondary | ICD-10-CM | POA: Diagnosis not present

## 2016-08-28 DIAGNOSIS — D500 Iron deficiency anemia secondary to blood loss (chronic): Secondary | ICD-10-CM | POA: Diagnosis not present

## 2016-08-28 DIAGNOSIS — S98131A Complete traumatic amputation of one right lesser toe, initial encounter: Secondary | ICD-10-CM | POA: Diagnosis not present

## 2016-08-28 DIAGNOSIS — D631 Anemia in chronic kidney disease: Secondary | ICD-10-CM | POA: Diagnosis not present

## 2016-08-30 DIAGNOSIS — S98131A Complete traumatic amputation of one right lesser toe, initial encounter: Secondary | ICD-10-CM | POA: Diagnosis not present

## 2016-08-30 DIAGNOSIS — N2581 Secondary hyperparathyroidism of renal origin: Secondary | ICD-10-CM | POA: Diagnosis not present

## 2016-08-30 DIAGNOSIS — Z992 Dependence on renal dialysis: Secondary | ICD-10-CM | POA: Diagnosis not present

## 2016-08-30 DIAGNOSIS — N186 End stage renal disease: Secondary | ICD-10-CM | POA: Diagnosis not present

## 2016-08-30 DIAGNOSIS — D631 Anemia in chronic kidney disease: Secondary | ICD-10-CM | POA: Diagnosis not present

## 2016-08-30 DIAGNOSIS — D500 Iron deficiency anemia secondary to blood loss (chronic): Secondary | ICD-10-CM | POA: Diagnosis not present

## 2016-09-01 DIAGNOSIS — S98131A Complete traumatic amputation of one right lesser toe, initial encounter: Secondary | ICD-10-CM | POA: Diagnosis not present

## 2016-09-01 DIAGNOSIS — N186 End stage renal disease: Secondary | ICD-10-CM | POA: Diagnosis not present

## 2016-09-01 DIAGNOSIS — N2581 Secondary hyperparathyroidism of renal origin: Secondary | ICD-10-CM | POA: Diagnosis not present

## 2016-09-01 DIAGNOSIS — D6959 Other secondary thrombocytopenia: Secondary | ICD-10-CM | POA: Diagnosis not present

## 2016-09-01 DIAGNOSIS — D500 Iron deficiency anemia secondary to blood loss (chronic): Secondary | ICD-10-CM | POA: Diagnosis not present

## 2016-09-01 DIAGNOSIS — D631 Anemia in chronic kidney disease: Secondary | ICD-10-CM | POA: Diagnosis not present

## 2016-09-04 DIAGNOSIS — D631 Anemia in chronic kidney disease: Secondary | ICD-10-CM | POA: Diagnosis not present

## 2016-09-04 DIAGNOSIS — D6959 Other secondary thrombocytopenia: Secondary | ICD-10-CM | POA: Diagnosis not present

## 2016-09-04 DIAGNOSIS — N186 End stage renal disease: Secondary | ICD-10-CM | POA: Diagnosis not present

## 2016-09-04 DIAGNOSIS — N2581 Secondary hyperparathyroidism of renal origin: Secondary | ICD-10-CM | POA: Diagnosis not present

## 2016-09-04 DIAGNOSIS — D500 Iron deficiency anemia secondary to blood loss (chronic): Secondary | ICD-10-CM | POA: Diagnosis not present

## 2016-09-04 DIAGNOSIS — S98131A Complete traumatic amputation of one right lesser toe, initial encounter: Secondary | ICD-10-CM | POA: Diagnosis not present

## 2016-09-06 DIAGNOSIS — D500 Iron deficiency anemia secondary to blood loss (chronic): Secondary | ICD-10-CM | POA: Diagnosis not present

## 2016-09-06 DIAGNOSIS — D631 Anemia in chronic kidney disease: Secondary | ICD-10-CM | POA: Diagnosis not present

## 2016-09-06 DIAGNOSIS — D6959 Other secondary thrombocytopenia: Secondary | ICD-10-CM | POA: Diagnosis not present

## 2016-09-06 DIAGNOSIS — S98131A Complete traumatic amputation of one right lesser toe, initial encounter: Secondary | ICD-10-CM | POA: Diagnosis not present

## 2016-09-06 DIAGNOSIS — N186 End stage renal disease: Secondary | ICD-10-CM | POA: Diagnosis not present

## 2016-09-06 DIAGNOSIS — N2581 Secondary hyperparathyroidism of renal origin: Secondary | ICD-10-CM | POA: Diagnosis not present

## 2016-09-08 DIAGNOSIS — D6959 Other secondary thrombocytopenia: Secondary | ICD-10-CM | POA: Diagnosis not present

## 2016-09-08 DIAGNOSIS — D631 Anemia in chronic kidney disease: Secondary | ICD-10-CM | POA: Diagnosis not present

## 2016-09-08 DIAGNOSIS — N2581 Secondary hyperparathyroidism of renal origin: Secondary | ICD-10-CM | POA: Diagnosis not present

## 2016-09-08 DIAGNOSIS — S98131A Complete traumatic amputation of one right lesser toe, initial encounter: Secondary | ICD-10-CM | POA: Diagnosis not present

## 2016-09-08 DIAGNOSIS — N186 End stage renal disease: Secondary | ICD-10-CM | POA: Diagnosis not present

## 2016-09-08 DIAGNOSIS — D500 Iron deficiency anemia secondary to blood loss (chronic): Secondary | ICD-10-CM | POA: Diagnosis not present

## 2016-09-11 DIAGNOSIS — N186 End stage renal disease: Secondary | ICD-10-CM | POA: Diagnosis not present

## 2016-09-11 DIAGNOSIS — S98131A Complete traumatic amputation of one right lesser toe, initial encounter: Secondary | ICD-10-CM | POA: Diagnosis not present

## 2016-09-11 DIAGNOSIS — D631 Anemia in chronic kidney disease: Secondary | ICD-10-CM | POA: Diagnosis not present

## 2016-09-11 DIAGNOSIS — D500 Iron deficiency anemia secondary to blood loss (chronic): Secondary | ICD-10-CM | POA: Diagnosis not present

## 2016-09-11 DIAGNOSIS — N2581 Secondary hyperparathyroidism of renal origin: Secondary | ICD-10-CM | POA: Diagnosis not present

## 2016-09-11 DIAGNOSIS — D6959 Other secondary thrombocytopenia: Secondary | ICD-10-CM | POA: Diagnosis not present

## 2016-09-13 DIAGNOSIS — N186 End stage renal disease: Secondary | ICD-10-CM | POA: Diagnosis not present

## 2016-09-13 DIAGNOSIS — E1129 Type 2 diabetes mellitus with other diabetic kidney complication: Secondary | ICD-10-CM | POA: Diagnosis not present

## 2016-09-13 DIAGNOSIS — D6959 Other secondary thrombocytopenia: Secondary | ICD-10-CM | POA: Diagnosis not present

## 2016-09-13 DIAGNOSIS — S98131A Complete traumatic amputation of one right lesser toe, initial encounter: Secondary | ICD-10-CM | POA: Diagnosis not present

## 2016-09-13 DIAGNOSIS — D631 Anemia in chronic kidney disease: Secondary | ICD-10-CM | POA: Diagnosis not present

## 2016-09-13 DIAGNOSIS — D500 Iron deficiency anemia secondary to blood loss (chronic): Secondary | ICD-10-CM | POA: Diagnosis not present

## 2016-09-13 DIAGNOSIS — N2581 Secondary hyperparathyroidism of renal origin: Secondary | ICD-10-CM | POA: Diagnosis not present

## 2016-09-15 DIAGNOSIS — D500 Iron deficiency anemia secondary to blood loss (chronic): Secondary | ICD-10-CM | POA: Diagnosis not present

## 2016-09-15 DIAGNOSIS — N186 End stage renal disease: Secondary | ICD-10-CM | POA: Diagnosis not present

## 2016-09-15 DIAGNOSIS — N2581 Secondary hyperparathyroidism of renal origin: Secondary | ICD-10-CM | POA: Diagnosis not present

## 2016-09-15 DIAGNOSIS — S98131A Complete traumatic amputation of one right lesser toe, initial encounter: Secondary | ICD-10-CM | POA: Diagnosis not present

## 2016-09-15 DIAGNOSIS — D631 Anemia in chronic kidney disease: Secondary | ICD-10-CM | POA: Diagnosis not present

## 2016-09-15 DIAGNOSIS — D6959 Other secondary thrombocytopenia: Secondary | ICD-10-CM | POA: Diagnosis not present

## 2016-09-18 DIAGNOSIS — N186 End stage renal disease: Secondary | ICD-10-CM | POA: Diagnosis not present

## 2016-09-18 DIAGNOSIS — N2581 Secondary hyperparathyroidism of renal origin: Secondary | ICD-10-CM | POA: Diagnosis not present

## 2016-09-18 DIAGNOSIS — D631 Anemia in chronic kidney disease: Secondary | ICD-10-CM | POA: Diagnosis not present

## 2016-09-18 DIAGNOSIS — D500 Iron deficiency anemia secondary to blood loss (chronic): Secondary | ICD-10-CM | POA: Diagnosis not present

## 2016-09-18 DIAGNOSIS — D6959 Other secondary thrombocytopenia: Secondary | ICD-10-CM | POA: Diagnosis not present

## 2016-09-18 DIAGNOSIS — S98131A Complete traumatic amputation of one right lesser toe, initial encounter: Secondary | ICD-10-CM | POA: Diagnosis not present

## 2016-09-20 DIAGNOSIS — D6959 Other secondary thrombocytopenia: Secondary | ICD-10-CM | POA: Diagnosis not present

## 2016-09-20 DIAGNOSIS — N186 End stage renal disease: Secondary | ICD-10-CM | POA: Diagnosis not present

## 2016-09-20 DIAGNOSIS — D631 Anemia in chronic kidney disease: Secondary | ICD-10-CM | POA: Diagnosis not present

## 2016-09-20 DIAGNOSIS — S98131A Complete traumatic amputation of one right lesser toe, initial encounter: Secondary | ICD-10-CM | POA: Diagnosis not present

## 2016-09-20 DIAGNOSIS — N2581 Secondary hyperparathyroidism of renal origin: Secondary | ICD-10-CM | POA: Diagnosis not present

## 2016-09-20 DIAGNOSIS — D500 Iron deficiency anemia secondary to blood loss (chronic): Secondary | ICD-10-CM | POA: Diagnosis not present

## 2016-09-21 DIAGNOSIS — Z713 Dietary counseling and surveillance: Secondary | ICD-10-CM | POA: Diagnosis not present

## 2016-09-21 DIAGNOSIS — Z992 Dependence on renal dialysis: Secondary | ICD-10-CM | POA: Diagnosis not present

## 2016-09-21 DIAGNOSIS — E7800 Pure hypercholesterolemia, unspecified: Secondary | ICD-10-CM | POA: Diagnosis not present

## 2016-09-21 DIAGNOSIS — I10 Essential (primary) hypertension: Secondary | ICD-10-CM | POA: Diagnosis not present

## 2016-09-21 DIAGNOSIS — N186 End stage renal disease: Secondary | ICD-10-CM | POA: Diagnosis not present

## 2016-09-21 DIAGNOSIS — R972 Elevated prostate specific antigen [PSA]: Secondary | ICD-10-CM | POA: Diagnosis not present

## 2016-09-21 DIAGNOSIS — Z299 Encounter for prophylactic measures, unspecified: Secondary | ICD-10-CM | POA: Diagnosis not present

## 2016-09-21 DIAGNOSIS — E1165 Type 2 diabetes mellitus with hyperglycemia: Secondary | ICD-10-CM | POA: Diagnosis not present

## 2016-09-21 DIAGNOSIS — J61 Pneumoconiosis due to asbestos and other mineral fibers: Secondary | ICD-10-CM | POA: Diagnosis not present

## 2016-09-21 DIAGNOSIS — E1122 Type 2 diabetes mellitus with diabetic chronic kidney disease: Secondary | ICD-10-CM | POA: Diagnosis not present

## 2016-09-21 DIAGNOSIS — Z6826 Body mass index (BMI) 26.0-26.9, adult: Secondary | ICD-10-CM | POA: Diagnosis not present

## 2016-09-22 DIAGNOSIS — D500 Iron deficiency anemia secondary to blood loss (chronic): Secondary | ICD-10-CM | POA: Diagnosis not present

## 2016-09-22 DIAGNOSIS — N186 End stage renal disease: Secondary | ICD-10-CM | POA: Diagnosis not present

## 2016-09-22 DIAGNOSIS — D6959 Other secondary thrombocytopenia: Secondary | ICD-10-CM | POA: Diagnosis not present

## 2016-09-22 DIAGNOSIS — S98131A Complete traumatic amputation of one right lesser toe, initial encounter: Secondary | ICD-10-CM | POA: Diagnosis not present

## 2016-09-22 DIAGNOSIS — N2581 Secondary hyperparathyroidism of renal origin: Secondary | ICD-10-CM | POA: Diagnosis not present

## 2016-09-22 DIAGNOSIS — D631 Anemia in chronic kidney disease: Secondary | ICD-10-CM | POA: Diagnosis not present

## 2016-09-25 DIAGNOSIS — D631 Anemia in chronic kidney disease: Secondary | ICD-10-CM | POA: Diagnosis not present

## 2016-09-25 DIAGNOSIS — S98131A Complete traumatic amputation of one right lesser toe, initial encounter: Secondary | ICD-10-CM | POA: Diagnosis not present

## 2016-09-25 DIAGNOSIS — D6959 Other secondary thrombocytopenia: Secondary | ICD-10-CM | POA: Diagnosis not present

## 2016-09-25 DIAGNOSIS — N186 End stage renal disease: Secondary | ICD-10-CM | POA: Diagnosis not present

## 2016-09-25 DIAGNOSIS — D500 Iron deficiency anemia secondary to blood loss (chronic): Secondary | ICD-10-CM | POA: Diagnosis not present

## 2016-09-25 DIAGNOSIS — N2581 Secondary hyperparathyroidism of renal origin: Secondary | ICD-10-CM | POA: Diagnosis not present

## 2016-09-27 DIAGNOSIS — N2581 Secondary hyperparathyroidism of renal origin: Secondary | ICD-10-CM | POA: Diagnosis not present

## 2016-09-27 DIAGNOSIS — N186 End stage renal disease: Secondary | ICD-10-CM | POA: Diagnosis not present

## 2016-09-27 DIAGNOSIS — D6959 Other secondary thrombocytopenia: Secondary | ICD-10-CM | POA: Diagnosis not present

## 2016-09-27 DIAGNOSIS — D631 Anemia in chronic kidney disease: Secondary | ICD-10-CM | POA: Diagnosis not present

## 2016-09-27 DIAGNOSIS — S98131A Complete traumatic amputation of one right lesser toe, initial encounter: Secondary | ICD-10-CM | POA: Diagnosis not present

## 2016-09-27 DIAGNOSIS — D500 Iron deficiency anemia secondary to blood loss (chronic): Secondary | ICD-10-CM | POA: Diagnosis not present

## 2016-09-29 DIAGNOSIS — D631 Anemia in chronic kidney disease: Secondary | ICD-10-CM | POA: Diagnosis not present

## 2016-09-29 DIAGNOSIS — N2581 Secondary hyperparathyroidism of renal origin: Secondary | ICD-10-CM | POA: Diagnosis not present

## 2016-09-29 DIAGNOSIS — Z992 Dependence on renal dialysis: Secondary | ICD-10-CM | POA: Diagnosis not present

## 2016-09-29 DIAGNOSIS — S98131A Complete traumatic amputation of one right lesser toe, initial encounter: Secondary | ICD-10-CM | POA: Diagnosis not present

## 2016-09-29 DIAGNOSIS — D500 Iron deficiency anemia secondary to blood loss (chronic): Secondary | ICD-10-CM | POA: Diagnosis not present

## 2016-09-29 DIAGNOSIS — N186 End stage renal disease: Secondary | ICD-10-CM | POA: Diagnosis not present

## 2016-09-29 DIAGNOSIS — D6959 Other secondary thrombocytopenia: Secondary | ICD-10-CM | POA: Diagnosis not present

## 2016-10-02 DIAGNOSIS — S98131A Complete traumatic amputation of one right lesser toe, initial encounter: Secondary | ICD-10-CM | POA: Diagnosis not present

## 2016-10-02 DIAGNOSIS — N2581 Secondary hyperparathyroidism of renal origin: Secondary | ICD-10-CM | POA: Diagnosis not present

## 2016-10-02 DIAGNOSIS — D500 Iron deficiency anemia secondary to blood loss (chronic): Secondary | ICD-10-CM | POA: Diagnosis not present

## 2016-10-02 DIAGNOSIS — E8809 Other disorders of plasma-protein metabolism, not elsewhere classified: Secondary | ICD-10-CM | POA: Diagnosis not present

## 2016-10-02 DIAGNOSIS — N186 End stage renal disease: Secondary | ICD-10-CM | POA: Diagnosis not present

## 2016-10-02 DIAGNOSIS — D631 Anemia in chronic kidney disease: Secondary | ICD-10-CM | POA: Diagnosis not present

## 2016-10-04 DIAGNOSIS — N186 End stage renal disease: Secondary | ICD-10-CM | POA: Diagnosis not present

## 2016-10-04 DIAGNOSIS — D631 Anemia in chronic kidney disease: Secondary | ICD-10-CM | POA: Diagnosis not present

## 2016-10-04 DIAGNOSIS — N2581 Secondary hyperparathyroidism of renal origin: Secondary | ICD-10-CM | POA: Diagnosis not present

## 2016-10-04 DIAGNOSIS — E8809 Other disorders of plasma-protein metabolism, not elsewhere classified: Secondary | ICD-10-CM | POA: Diagnosis not present

## 2016-10-04 DIAGNOSIS — S98131A Complete traumatic amputation of one right lesser toe, initial encounter: Secondary | ICD-10-CM | POA: Diagnosis not present

## 2016-10-06 DIAGNOSIS — D631 Anemia in chronic kidney disease: Secondary | ICD-10-CM | POA: Diagnosis not present

## 2016-10-06 DIAGNOSIS — E8809 Other disorders of plasma-protein metabolism, not elsewhere classified: Secondary | ICD-10-CM | POA: Diagnosis not present

## 2016-10-06 DIAGNOSIS — N186 End stage renal disease: Secondary | ICD-10-CM | POA: Diagnosis not present

## 2016-10-06 DIAGNOSIS — N2581 Secondary hyperparathyroidism of renal origin: Secondary | ICD-10-CM | POA: Diagnosis not present

## 2016-10-06 DIAGNOSIS — S98131A Complete traumatic amputation of one right lesser toe, initial encounter: Secondary | ICD-10-CM | POA: Diagnosis not present

## 2016-10-09 DIAGNOSIS — N2581 Secondary hyperparathyroidism of renal origin: Secondary | ICD-10-CM | POA: Diagnosis not present

## 2016-10-09 DIAGNOSIS — N186 End stage renal disease: Secondary | ICD-10-CM | POA: Diagnosis not present

## 2016-10-09 DIAGNOSIS — D631 Anemia in chronic kidney disease: Secondary | ICD-10-CM | POA: Diagnosis not present

## 2016-10-09 DIAGNOSIS — S98131A Complete traumatic amputation of one right lesser toe, initial encounter: Secondary | ICD-10-CM | POA: Diagnosis not present

## 2016-10-09 DIAGNOSIS — E8809 Other disorders of plasma-protein metabolism, not elsewhere classified: Secondary | ICD-10-CM | POA: Diagnosis not present

## 2016-10-11 DIAGNOSIS — S98131A Complete traumatic amputation of one right lesser toe, initial encounter: Secondary | ICD-10-CM | POA: Diagnosis not present

## 2016-10-11 DIAGNOSIS — N2581 Secondary hyperparathyroidism of renal origin: Secondary | ICD-10-CM | POA: Diagnosis not present

## 2016-10-11 DIAGNOSIS — N186 End stage renal disease: Secondary | ICD-10-CM | POA: Diagnosis not present

## 2016-10-11 DIAGNOSIS — E8809 Other disorders of plasma-protein metabolism, not elsewhere classified: Secondary | ICD-10-CM | POA: Diagnosis not present

## 2016-10-11 DIAGNOSIS — D631 Anemia in chronic kidney disease: Secondary | ICD-10-CM | POA: Diagnosis not present

## 2016-10-13 DIAGNOSIS — N186 End stage renal disease: Secondary | ICD-10-CM | POA: Diagnosis not present

## 2016-10-13 DIAGNOSIS — D631 Anemia in chronic kidney disease: Secondary | ICD-10-CM | POA: Diagnosis not present

## 2016-10-13 DIAGNOSIS — S98131A Complete traumatic amputation of one right lesser toe, initial encounter: Secondary | ICD-10-CM | POA: Diagnosis not present

## 2016-10-13 DIAGNOSIS — E8809 Other disorders of plasma-protein metabolism, not elsewhere classified: Secondary | ICD-10-CM | POA: Diagnosis not present

## 2016-10-13 DIAGNOSIS — N2581 Secondary hyperparathyroidism of renal origin: Secondary | ICD-10-CM | POA: Diagnosis not present

## 2016-10-16 DIAGNOSIS — D631 Anemia in chronic kidney disease: Secondary | ICD-10-CM | POA: Diagnosis not present

## 2016-10-16 DIAGNOSIS — E8809 Other disorders of plasma-protein metabolism, not elsewhere classified: Secondary | ICD-10-CM | POA: Diagnosis not present

## 2016-10-16 DIAGNOSIS — N186 End stage renal disease: Secondary | ICD-10-CM | POA: Diagnosis not present

## 2016-10-16 DIAGNOSIS — S98131A Complete traumatic amputation of one right lesser toe, initial encounter: Secondary | ICD-10-CM | POA: Diagnosis not present

## 2016-10-16 DIAGNOSIS — N2581 Secondary hyperparathyroidism of renal origin: Secondary | ICD-10-CM | POA: Diagnosis not present

## 2016-10-18 DIAGNOSIS — S98131A Complete traumatic amputation of one right lesser toe, initial encounter: Secondary | ICD-10-CM | POA: Diagnosis not present

## 2016-10-18 DIAGNOSIS — D631 Anemia in chronic kidney disease: Secondary | ICD-10-CM | POA: Diagnosis not present

## 2016-10-18 DIAGNOSIS — N186 End stage renal disease: Secondary | ICD-10-CM | POA: Diagnosis not present

## 2016-10-18 DIAGNOSIS — N2581 Secondary hyperparathyroidism of renal origin: Secondary | ICD-10-CM | POA: Diagnosis not present

## 2016-10-18 DIAGNOSIS — E8809 Other disorders of plasma-protein metabolism, not elsewhere classified: Secondary | ICD-10-CM | POA: Diagnosis not present

## 2016-10-20 DIAGNOSIS — N2581 Secondary hyperparathyroidism of renal origin: Secondary | ICD-10-CM | POA: Diagnosis not present

## 2016-10-20 DIAGNOSIS — N186 End stage renal disease: Secondary | ICD-10-CM | POA: Diagnosis not present

## 2016-10-20 DIAGNOSIS — S98131A Complete traumatic amputation of one right lesser toe, initial encounter: Secondary | ICD-10-CM | POA: Diagnosis not present

## 2016-10-20 DIAGNOSIS — E8809 Other disorders of plasma-protein metabolism, not elsewhere classified: Secondary | ICD-10-CM | POA: Diagnosis not present

## 2016-10-20 DIAGNOSIS — D631 Anemia in chronic kidney disease: Secondary | ICD-10-CM | POA: Diagnosis not present

## 2016-10-23 DIAGNOSIS — S98131A Complete traumatic amputation of one right lesser toe, initial encounter: Secondary | ICD-10-CM | POA: Diagnosis not present

## 2016-10-23 DIAGNOSIS — N2581 Secondary hyperparathyroidism of renal origin: Secondary | ICD-10-CM | POA: Diagnosis not present

## 2016-10-23 DIAGNOSIS — D631 Anemia in chronic kidney disease: Secondary | ICD-10-CM | POA: Diagnosis not present

## 2016-10-23 DIAGNOSIS — E8809 Other disorders of plasma-protein metabolism, not elsewhere classified: Secondary | ICD-10-CM | POA: Diagnosis not present

## 2016-10-23 DIAGNOSIS — N186 End stage renal disease: Secondary | ICD-10-CM | POA: Diagnosis not present

## 2016-10-25 DIAGNOSIS — N2581 Secondary hyperparathyroidism of renal origin: Secondary | ICD-10-CM | POA: Diagnosis not present

## 2016-10-25 DIAGNOSIS — N186 End stage renal disease: Secondary | ICD-10-CM | POA: Diagnosis not present

## 2016-10-25 DIAGNOSIS — S98131A Complete traumatic amputation of one right lesser toe, initial encounter: Secondary | ICD-10-CM | POA: Diagnosis not present

## 2016-10-25 DIAGNOSIS — E8809 Other disorders of plasma-protein metabolism, not elsewhere classified: Secondary | ICD-10-CM | POA: Diagnosis not present

## 2016-10-25 DIAGNOSIS — D631 Anemia in chronic kidney disease: Secondary | ICD-10-CM | POA: Diagnosis not present

## 2016-10-27 DIAGNOSIS — N2581 Secondary hyperparathyroidism of renal origin: Secondary | ICD-10-CM | POA: Diagnosis not present

## 2016-10-27 DIAGNOSIS — S98131A Complete traumatic amputation of one right lesser toe, initial encounter: Secondary | ICD-10-CM | POA: Diagnosis not present

## 2016-10-27 DIAGNOSIS — N186 End stage renal disease: Secondary | ICD-10-CM | POA: Diagnosis not present

## 2016-10-27 DIAGNOSIS — E8809 Other disorders of plasma-protein metabolism, not elsewhere classified: Secondary | ICD-10-CM | POA: Diagnosis not present

## 2016-10-27 DIAGNOSIS — D631 Anemia in chronic kidney disease: Secondary | ICD-10-CM | POA: Diagnosis not present

## 2016-10-30 DIAGNOSIS — N2581 Secondary hyperparathyroidism of renal origin: Secondary | ICD-10-CM | POA: Diagnosis not present

## 2016-10-30 DIAGNOSIS — Z992 Dependence on renal dialysis: Secondary | ICD-10-CM | POA: Diagnosis not present

## 2016-10-30 DIAGNOSIS — E8809 Other disorders of plasma-protein metabolism, not elsewhere classified: Secondary | ICD-10-CM | POA: Diagnosis not present

## 2016-10-30 DIAGNOSIS — S98131A Complete traumatic amputation of one right lesser toe, initial encounter: Secondary | ICD-10-CM | POA: Diagnosis not present

## 2016-10-30 DIAGNOSIS — D631 Anemia in chronic kidney disease: Secondary | ICD-10-CM | POA: Diagnosis not present

## 2016-10-30 DIAGNOSIS — N186 End stage renal disease: Secondary | ICD-10-CM | POA: Diagnosis not present

## 2016-11-01 DIAGNOSIS — D631 Anemia in chronic kidney disease: Secondary | ICD-10-CM | POA: Diagnosis not present

## 2016-11-01 DIAGNOSIS — N2581 Secondary hyperparathyroidism of renal origin: Secondary | ICD-10-CM | POA: Diagnosis not present

## 2016-11-01 DIAGNOSIS — N186 End stage renal disease: Secondary | ICD-10-CM | POA: Diagnosis not present

## 2016-11-01 DIAGNOSIS — D500 Iron deficiency anemia secondary to blood loss (chronic): Secondary | ICD-10-CM | POA: Diagnosis not present

## 2016-11-01 DIAGNOSIS — E8809 Other disorders of plasma-protein metabolism, not elsewhere classified: Secondary | ICD-10-CM | POA: Diagnosis not present

## 2016-11-03 DIAGNOSIS — N186 End stage renal disease: Secondary | ICD-10-CM | POA: Diagnosis not present

## 2016-11-03 DIAGNOSIS — N2581 Secondary hyperparathyroidism of renal origin: Secondary | ICD-10-CM | POA: Diagnosis not present

## 2016-11-03 DIAGNOSIS — E8809 Other disorders of plasma-protein metabolism, not elsewhere classified: Secondary | ICD-10-CM | POA: Diagnosis not present

## 2016-11-03 DIAGNOSIS — D631 Anemia in chronic kidney disease: Secondary | ICD-10-CM | POA: Diagnosis not present

## 2016-11-03 DIAGNOSIS — D500 Iron deficiency anemia secondary to blood loss (chronic): Secondary | ICD-10-CM | POA: Diagnosis not present

## 2016-11-06 DIAGNOSIS — N186 End stage renal disease: Secondary | ICD-10-CM | POA: Diagnosis not present

## 2016-11-06 DIAGNOSIS — E8809 Other disorders of plasma-protein metabolism, not elsewhere classified: Secondary | ICD-10-CM | POA: Diagnosis not present

## 2016-11-06 DIAGNOSIS — N2581 Secondary hyperparathyroidism of renal origin: Secondary | ICD-10-CM | POA: Diagnosis not present

## 2016-11-06 DIAGNOSIS — D500 Iron deficiency anemia secondary to blood loss (chronic): Secondary | ICD-10-CM | POA: Diagnosis not present

## 2016-11-06 DIAGNOSIS — D631 Anemia in chronic kidney disease: Secondary | ICD-10-CM | POA: Diagnosis not present

## 2016-11-08 DIAGNOSIS — D500 Iron deficiency anemia secondary to blood loss (chronic): Secondary | ICD-10-CM | POA: Diagnosis not present

## 2016-11-08 DIAGNOSIS — D631 Anemia in chronic kidney disease: Secondary | ICD-10-CM | POA: Diagnosis not present

## 2016-11-08 DIAGNOSIS — N186 End stage renal disease: Secondary | ICD-10-CM | POA: Diagnosis not present

## 2016-11-08 DIAGNOSIS — N2581 Secondary hyperparathyroidism of renal origin: Secondary | ICD-10-CM | POA: Diagnosis not present

## 2016-11-08 DIAGNOSIS — E8809 Other disorders of plasma-protein metabolism, not elsewhere classified: Secondary | ICD-10-CM | POA: Diagnosis not present

## 2016-11-10 DIAGNOSIS — D631 Anemia in chronic kidney disease: Secondary | ICD-10-CM | POA: Diagnosis not present

## 2016-11-10 DIAGNOSIS — N2581 Secondary hyperparathyroidism of renal origin: Secondary | ICD-10-CM | POA: Diagnosis not present

## 2016-11-10 DIAGNOSIS — D500 Iron deficiency anemia secondary to blood loss (chronic): Secondary | ICD-10-CM | POA: Diagnosis not present

## 2016-11-10 DIAGNOSIS — E8809 Other disorders of plasma-protein metabolism, not elsewhere classified: Secondary | ICD-10-CM | POA: Diagnosis not present

## 2016-11-10 DIAGNOSIS — N186 End stage renal disease: Secondary | ICD-10-CM | POA: Diagnosis not present

## 2016-11-13 DIAGNOSIS — D500 Iron deficiency anemia secondary to blood loss (chronic): Secondary | ICD-10-CM | POA: Diagnosis not present

## 2016-11-13 DIAGNOSIS — N186 End stage renal disease: Secondary | ICD-10-CM | POA: Diagnosis not present

## 2016-11-13 DIAGNOSIS — E8809 Other disorders of plasma-protein metabolism, not elsewhere classified: Secondary | ICD-10-CM | POA: Diagnosis not present

## 2016-11-13 DIAGNOSIS — D631 Anemia in chronic kidney disease: Secondary | ICD-10-CM | POA: Diagnosis not present

## 2016-11-13 DIAGNOSIS — N2581 Secondary hyperparathyroidism of renal origin: Secondary | ICD-10-CM | POA: Diagnosis not present

## 2016-11-14 DIAGNOSIS — I10 Essential (primary) hypertension: Secondary | ICD-10-CM | POA: Diagnosis not present

## 2016-11-14 DIAGNOSIS — E119 Type 2 diabetes mellitus without complications: Secondary | ICD-10-CM | POA: Diagnosis not present

## 2016-11-15 DIAGNOSIS — E8809 Other disorders of plasma-protein metabolism, not elsewhere classified: Secondary | ICD-10-CM | POA: Diagnosis not present

## 2016-11-15 DIAGNOSIS — N186 End stage renal disease: Secondary | ICD-10-CM | POA: Diagnosis not present

## 2016-11-15 DIAGNOSIS — D500 Iron deficiency anemia secondary to blood loss (chronic): Secondary | ICD-10-CM | POA: Diagnosis not present

## 2016-11-15 DIAGNOSIS — D631 Anemia in chronic kidney disease: Secondary | ICD-10-CM | POA: Diagnosis not present

## 2016-11-15 DIAGNOSIS — N2581 Secondary hyperparathyroidism of renal origin: Secondary | ICD-10-CM | POA: Diagnosis not present

## 2016-11-17 DIAGNOSIS — D500 Iron deficiency anemia secondary to blood loss (chronic): Secondary | ICD-10-CM | POA: Diagnosis not present

## 2016-11-17 DIAGNOSIS — N2581 Secondary hyperparathyroidism of renal origin: Secondary | ICD-10-CM | POA: Diagnosis not present

## 2016-11-17 DIAGNOSIS — D631 Anemia in chronic kidney disease: Secondary | ICD-10-CM | POA: Diagnosis not present

## 2016-11-17 DIAGNOSIS — E8809 Other disorders of plasma-protein metabolism, not elsewhere classified: Secondary | ICD-10-CM | POA: Diagnosis not present

## 2016-11-17 DIAGNOSIS — N186 End stage renal disease: Secondary | ICD-10-CM | POA: Diagnosis not present

## 2016-11-20 DIAGNOSIS — E8809 Other disorders of plasma-protein metabolism, not elsewhere classified: Secondary | ICD-10-CM | POA: Diagnosis not present

## 2016-11-20 DIAGNOSIS — N186 End stage renal disease: Secondary | ICD-10-CM | POA: Diagnosis not present

## 2016-11-20 DIAGNOSIS — N2581 Secondary hyperparathyroidism of renal origin: Secondary | ICD-10-CM | POA: Diagnosis not present

## 2016-11-20 DIAGNOSIS — D500 Iron deficiency anemia secondary to blood loss (chronic): Secondary | ICD-10-CM | POA: Diagnosis not present

## 2016-11-20 DIAGNOSIS — D631 Anemia in chronic kidney disease: Secondary | ICD-10-CM | POA: Diagnosis not present

## 2016-11-22 DIAGNOSIS — D500 Iron deficiency anemia secondary to blood loss (chronic): Secondary | ICD-10-CM | POA: Diagnosis not present

## 2016-11-22 DIAGNOSIS — N2581 Secondary hyperparathyroidism of renal origin: Secondary | ICD-10-CM | POA: Diagnosis not present

## 2016-11-22 DIAGNOSIS — D631 Anemia in chronic kidney disease: Secondary | ICD-10-CM | POA: Diagnosis not present

## 2016-11-22 DIAGNOSIS — N186 End stage renal disease: Secondary | ICD-10-CM | POA: Diagnosis not present

## 2016-11-22 DIAGNOSIS — E8809 Other disorders of plasma-protein metabolism, not elsewhere classified: Secondary | ICD-10-CM | POA: Diagnosis not present

## 2016-11-24 DIAGNOSIS — E8809 Other disorders of plasma-protein metabolism, not elsewhere classified: Secondary | ICD-10-CM | POA: Diagnosis not present

## 2016-11-24 DIAGNOSIS — N2581 Secondary hyperparathyroidism of renal origin: Secondary | ICD-10-CM | POA: Diagnosis not present

## 2016-11-24 DIAGNOSIS — D500 Iron deficiency anemia secondary to blood loss (chronic): Secondary | ICD-10-CM | POA: Diagnosis not present

## 2016-11-24 DIAGNOSIS — D631 Anemia in chronic kidney disease: Secondary | ICD-10-CM | POA: Diagnosis not present

## 2016-11-24 DIAGNOSIS — N186 End stage renal disease: Secondary | ICD-10-CM | POA: Diagnosis not present

## 2016-11-27 DIAGNOSIS — D500 Iron deficiency anemia secondary to blood loss (chronic): Secondary | ICD-10-CM | POA: Diagnosis not present

## 2016-11-27 DIAGNOSIS — D631 Anemia in chronic kidney disease: Secondary | ICD-10-CM | POA: Diagnosis not present

## 2016-11-27 DIAGNOSIS — N186 End stage renal disease: Secondary | ICD-10-CM | POA: Diagnosis not present

## 2016-11-27 DIAGNOSIS — E8809 Other disorders of plasma-protein metabolism, not elsewhere classified: Secondary | ICD-10-CM | POA: Diagnosis not present

## 2016-11-27 DIAGNOSIS — N2581 Secondary hyperparathyroidism of renal origin: Secondary | ICD-10-CM | POA: Diagnosis not present

## 2016-11-29 DIAGNOSIS — E8809 Other disorders of plasma-protein metabolism, not elsewhere classified: Secondary | ICD-10-CM | POA: Diagnosis not present

## 2016-11-29 DIAGNOSIS — D631 Anemia in chronic kidney disease: Secondary | ICD-10-CM | POA: Diagnosis not present

## 2016-11-29 DIAGNOSIS — D500 Iron deficiency anemia secondary to blood loss (chronic): Secondary | ICD-10-CM | POA: Diagnosis not present

## 2016-11-29 DIAGNOSIS — N2581 Secondary hyperparathyroidism of renal origin: Secondary | ICD-10-CM | POA: Diagnosis not present

## 2016-11-29 DIAGNOSIS — N186 End stage renal disease: Secondary | ICD-10-CM | POA: Diagnosis not present

## 2016-11-30 DIAGNOSIS — N186 End stage renal disease: Secondary | ICD-10-CM | POA: Diagnosis not present

## 2016-11-30 DIAGNOSIS — Z992 Dependence on renal dialysis: Secondary | ICD-10-CM | POA: Diagnosis not present

## 2016-12-01 DIAGNOSIS — D500 Iron deficiency anemia secondary to blood loss (chronic): Secondary | ICD-10-CM | POA: Diagnosis not present

## 2016-12-01 DIAGNOSIS — D6959 Other secondary thrombocytopenia: Secondary | ICD-10-CM | POA: Diagnosis not present

## 2016-12-01 DIAGNOSIS — N186 End stage renal disease: Secondary | ICD-10-CM | POA: Diagnosis not present

## 2016-12-01 DIAGNOSIS — D631 Anemia in chronic kidney disease: Secondary | ICD-10-CM | POA: Diagnosis not present

## 2016-12-01 DIAGNOSIS — N2581 Secondary hyperparathyroidism of renal origin: Secondary | ICD-10-CM | POA: Diagnosis not present

## 2016-12-04 DIAGNOSIS — D631 Anemia in chronic kidney disease: Secondary | ICD-10-CM | POA: Diagnosis not present

## 2016-12-04 DIAGNOSIS — N2581 Secondary hyperparathyroidism of renal origin: Secondary | ICD-10-CM | POA: Diagnosis not present

## 2016-12-04 DIAGNOSIS — N186 End stage renal disease: Secondary | ICD-10-CM | POA: Diagnosis not present

## 2016-12-04 DIAGNOSIS — D6959 Other secondary thrombocytopenia: Secondary | ICD-10-CM | POA: Diagnosis not present

## 2016-12-04 DIAGNOSIS — D500 Iron deficiency anemia secondary to blood loss (chronic): Secondary | ICD-10-CM | POA: Diagnosis not present

## 2016-12-06 DIAGNOSIS — N2581 Secondary hyperparathyroidism of renal origin: Secondary | ICD-10-CM | POA: Diagnosis not present

## 2016-12-06 DIAGNOSIS — N186 End stage renal disease: Secondary | ICD-10-CM | POA: Diagnosis not present

## 2016-12-06 DIAGNOSIS — D500 Iron deficiency anemia secondary to blood loss (chronic): Secondary | ICD-10-CM | POA: Diagnosis not present

## 2016-12-06 DIAGNOSIS — D6959 Other secondary thrombocytopenia: Secondary | ICD-10-CM | POA: Diagnosis not present

## 2016-12-06 DIAGNOSIS — D631 Anemia in chronic kidney disease: Secondary | ICD-10-CM | POA: Diagnosis not present

## 2016-12-08 DIAGNOSIS — D500 Iron deficiency anemia secondary to blood loss (chronic): Secondary | ICD-10-CM | POA: Diagnosis not present

## 2016-12-08 DIAGNOSIS — N186 End stage renal disease: Secondary | ICD-10-CM | POA: Diagnosis not present

## 2016-12-08 DIAGNOSIS — N2581 Secondary hyperparathyroidism of renal origin: Secondary | ICD-10-CM | POA: Diagnosis not present

## 2016-12-08 DIAGNOSIS — D6959 Other secondary thrombocytopenia: Secondary | ICD-10-CM | POA: Diagnosis not present

## 2016-12-08 DIAGNOSIS — D631 Anemia in chronic kidney disease: Secondary | ICD-10-CM | POA: Diagnosis not present

## 2016-12-11 DIAGNOSIS — N2581 Secondary hyperparathyroidism of renal origin: Secondary | ICD-10-CM | POA: Diagnosis not present

## 2016-12-11 DIAGNOSIS — D6959 Other secondary thrombocytopenia: Secondary | ICD-10-CM | POA: Diagnosis not present

## 2016-12-11 DIAGNOSIS — D631 Anemia in chronic kidney disease: Secondary | ICD-10-CM | POA: Diagnosis not present

## 2016-12-11 DIAGNOSIS — D500 Iron deficiency anemia secondary to blood loss (chronic): Secondary | ICD-10-CM | POA: Diagnosis not present

## 2016-12-11 DIAGNOSIS — N186 End stage renal disease: Secondary | ICD-10-CM | POA: Diagnosis not present

## 2016-12-13 DIAGNOSIS — E1129 Type 2 diabetes mellitus with other diabetic kidney complication: Secondary | ICD-10-CM | POA: Diagnosis not present

## 2016-12-13 DIAGNOSIS — D6959 Other secondary thrombocytopenia: Secondary | ICD-10-CM | POA: Diagnosis not present

## 2016-12-13 DIAGNOSIS — N186 End stage renal disease: Secondary | ICD-10-CM | POA: Diagnosis not present

## 2016-12-13 DIAGNOSIS — D631 Anemia in chronic kidney disease: Secondary | ICD-10-CM | POA: Diagnosis not present

## 2016-12-13 DIAGNOSIS — D500 Iron deficiency anemia secondary to blood loss (chronic): Secondary | ICD-10-CM | POA: Diagnosis not present

## 2016-12-13 DIAGNOSIS — N2581 Secondary hyperparathyroidism of renal origin: Secondary | ICD-10-CM | POA: Diagnosis not present

## 2016-12-15 DIAGNOSIS — D500 Iron deficiency anemia secondary to blood loss (chronic): Secondary | ICD-10-CM | POA: Diagnosis not present

## 2016-12-15 DIAGNOSIS — N2581 Secondary hyperparathyroidism of renal origin: Secondary | ICD-10-CM | POA: Diagnosis not present

## 2016-12-15 DIAGNOSIS — D631 Anemia in chronic kidney disease: Secondary | ICD-10-CM | POA: Diagnosis not present

## 2016-12-15 DIAGNOSIS — D6959 Other secondary thrombocytopenia: Secondary | ICD-10-CM | POA: Diagnosis not present

## 2016-12-15 DIAGNOSIS — N186 End stage renal disease: Secondary | ICD-10-CM | POA: Diagnosis not present

## 2016-12-18 DIAGNOSIS — D500 Iron deficiency anemia secondary to blood loss (chronic): Secondary | ICD-10-CM | POA: Diagnosis not present

## 2016-12-18 DIAGNOSIS — D631 Anemia in chronic kidney disease: Secondary | ICD-10-CM | POA: Diagnosis not present

## 2016-12-18 DIAGNOSIS — N2581 Secondary hyperparathyroidism of renal origin: Secondary | ICD-10-CM | POA: Diagnosis not present

## 2016-12-18 DIAGNOSIS — D6959 Other secondary thrombocytopenia: Secondary | ICD-10-CM | POA: Diagnosis not present

## 2016-12-18 DIAGNOSIS — N186 End stage renal disease: Secondary | ICD-10-CM | POA: Diagnosis not present

## 2016-12-20 DIAGNOSIS — N2581 Secondary hyperparathyroidism of renal origin: Secondary | ICD-10-CM | POA: Diagnosis not present

## 2016-12-20 DIAGNOSIS — N186 End stage renal disease: Secondary | ICD-10-CM | POA: Diagnosis not present

## 2016-12-20 DIAGNOSIS — D500 Iron deficiency anemia secondary to blood loss (chronic): Secondary | ICD-10-CM | POA: Diagnosis not present

## 2016-12-20 DIAGNOSIS — D6959 Other secondary thrombocytopenia: Secondary | ICD-10-CM | POA: Diagnosis not present

## 2016-12-20 DIAGNOSIS — D631 Anemia in chronic kidney disease: Secondary | ICD-10-CM | POA: Diagnosis not present

## 2016-12-22 DIAGNOSIS — D6959 Other secondary thrombocytopenia: Secondary | ICD-10-CM | POA: Diagnosis not present

## 2016-12-22 DIAGNOSIS — D631 Anemia in chronic kidney disease: Secondary | ICD-10-CM | POA: Diagnosis not present

## 2016-12-22 DIAGNOSIS — N186 End stage renal disease: Secondary | ICD-10-CM | POA: Diagnosis not present

## 2016-12-22 DIAGNOSIS — N2581 Secondary hyperparathyroidism of renal origin: Secondary | ICD-10-CM | POA: Diagnosis not present

## 2016-12-22 DIAGNOSIS — D500 Iron deficiency anemia secondary to blood loss (chronic): Secondary | ICD-10-CM | POA: Diagnosis not present

## 2016-12-25 DIAGNOSIS — D500 Iron deficiency anemia secondary to blood loss (chronic): Secondary | ICD-10-CM | POA: Diagnosis not present

## 2016-12-25 DIAGNOSIS — N2581 Secondary hyperparathyroidism of renal origin: Secondary | ICD-10-CM | POA: Diagnosis not present

## 2016-12-25 DIAGNOSIS — D631 Anemia in chronic kidney disease: Secondary | ICD-10-CM | POA: Diagnosis not present

## 2016-12-25 DIAGNOSIS — N186 End stage renal disease: Secondary | ICD-10-CM | POA: Diagnosis not present

## 2016-12-25 DIAGNOSIS — D6959 Other secondary thrombocytopenia: Secondary | ICD-10-CM | POA: Diagnosis not present

## 2016-12-27 DIAGNOSIS — N2581 Secondary hyperparathyroidism of renal origin: Secondary | ICD-10-CM | POA: Diagnosis not present

## 2016-12-27 DIAGNOSIS — D631 Anemia in chronic kidney disease: Secondary | ICD-10-CM | POA: Diagnosis not present

## 2016-12-27 DIAGNOSIS — D500 Iron deficiency anemia secondary to blood loss (chronic): Secondary | ICD-10-CM | POA: Diagnosis not present

## 2016-12-27 DIAGNOSIS — D6959 Other secondary thrombocytopenia: Secondary | ICD-10-CM | POA: Diagnosis not present

## 2016-12-27 DIAGNOSIS — N186 End stage renal disease: Secondary | ICD-10-CM | POA: Diagnosis not present

## 2016-12-28 DIAGNOSIS — E7800 Pure hypercholesterolemia, unspecified: Secondary | ICD-10-CM | POA: Diagnosis not present

## 2016-12-28 DIAGNOSIS — E1122 Type 2 diabetes mellitus with diabetic chronic kidney disease: Secondary | ICD-10-CM | POA: Diagnosis not present

## 2016-12-28 DIAGNOSIS — E1165 Type 2 diabetes mellitus with hyperglycemia: Secondary | ICD-10-CM | POA: Diagnosis not present

## 2016-12-28 DIAGNOSIS — Z992 Dependence on renal dialysis: Secondary | ICD-10-CM | POA: Diagnosis not present

## 2016-12-28 DIAGNOSIS — J61 Pneumoconiosis due to asbestos and other mineral fibers: Secondary | ICD-10-CM | POA: Diagnosis not present

## 2016-12-28 DIAGNOSIS — Z6826 Body mass index (BMI) 26.0-26.9, adult: Secondary | ICD-10-CM | POA: Diagnosis not present

## 2016-12-28 DIAGNOSIS — N186 End stage renal disease: Secondary | ICD-10-CM | POA: Diagnosis not present

## 2016-12-28 DIAGNOSIS — I10 Essential (primary) hypertension: Secondary | ICD-10-CM | POA: Diagnosis not present

## 2016-12-28 DIAGNOSIS — Z299 Encounter for prophylactic measures, unspecified: Secondary | ICD-10-CM | POA: Diagnosis not present

## 2016-12-28 DIAGNOSIS — Z713 Dietary counseling and surveillance: Secondary | ICD-10-CM | POA: Diagnosis not present

## 2016-12-29 DIAGNOSIS — D6959 Other secondary thrombocytopenia: Secondary | ICD-10-CM | POA: Diagnosis not present

## 2016-12-29 DIAGNOSIS — N2581 Secondary hyperparathyroidism of renal origin: Secondary | ICD-10-CM | POA: Diagnosis not present

## 2016-12-29 DIAGNOSIS — D500 Iron deficiency anemia secondary to blood loss (chronic): Secondary | ICD-10-CM | POA: Diagnosis not present

## 2016-12-29 DIAGNOSIS — N186 End stage renal disease: Secondary | ICD-10-CM | POA: Diagnosis not present

## 2016-12-29 DIAGNOSIS — D631 Anemia in chronic kidney disease: Secondary | ICD-10-CM | POA: Diagnosis not present

## 2016-12-30 DIAGNOSIS — Z992 Dependence on renal dialysis: Secondary | ICD-10-CM | POA: Diagnosis not present

## 2016-12-30 DIAGNOSIS — N186 End stage renal disease: Secondary | ICD-10-CM | POA: Diagnosis not present

## 2017-01-01 DIAGNOSIS — D500 Iron deficiency anemia secondary to blood loss (chronic): Secondary | ICD-10-CM | POA: Diagnosis not present

## 2017-01-01 DIAGNOSIS — E8809 Other disorders of plasma-protein metabolism, not elsewhere classified: Secondary | ICD-10-CM | POA: Diagnosis not present

## 2017-01-01 DIAGNOSIS — Z23 Encounter for immunization: Secondary | ICD-10-CM | POA: Diagnosis not present

## 2017-01-01 DIAGNOSIS — D631 Anemia in chronic kidney disease: Secondary | ICD-10-CM | POA: Diagnosis not present

## 2017-01-01 DIAGNOSIS — N186 End stage renal disease: Secondary | ICD-10-CM | POA: Diagnosis not present

## 2017-01-01 DIAGNOSIS — N2581 Secondary hyperparathyroidism of renal origin: Secondary | ICD-10-CM | POA: Diagnosis not present

## 2017-01-03 DIAGNOSIS — N2581 Secondary hyperparathyroidism of renal origin: Secondary | ICD-10-CM | POA: Diagnosis not present

## 2017-01-03 DIAGNOSIS — N186 End stage renal disease: Secondary | ICD-10-CM | POA: Diagnosis not present

## 2017-01-03 DIAGNOSIS — D631 Anemia in chronic kidney disease: Secondary | ICD-10-CM | POA: Diagnosis not present

## 2017-01-03 DIAGNOSIS — D500 Iron deficiency anemia secondary to blood loss (chronic): Secondary | ICD-10-CM | POA: Diagnosis not present

## 2017-01-03 DIAGNOSIS — E8809 Other disorders of plasma-protein metabolism, not elsewhere classified: Secondary | ICD-10-CM | POA: Diagnosis not present

## 2017-01-05 DIAGNOSIS — E8809 Other disorders of plasma-protein metabolism, not elsewhere classified: Secondary | ICD-10-CM | POA: Diagnosis not present

## 2017-01-05 DIAGNOSIS — D500 Iron deficiency anemia secondary to blood loss (chronic): Secondary | ICD-10-CM | POA: Diagnosis not present

## 2017-01-05 DIAGNOSIS — N186 End stage renal disease: Secondary | ICD-10-CM | POA: Diagnosis not present

## 2017-01-05 DIAGNOSIS — N2581 Secondary hyperparathyroidism of renal origin: Secondary | ICD-10-CM | POA: Diagnosis not present

## 2017-01-05 DIAGNOSIS — D631 Anemia in chronic kidney disease: Secondary | ICD-10-CM | POA: Diagnosis not present

## 2017-01-08 DIAGNOSIS — D631 Anemia in chronic kidney disease: Secondary | ICD-10-CM | POA: Diagnosis not present

## 2017-01-08 DIAGNOSIS — D500 Iron deficiency anemia secondary to blood loss (chronic): Secondary | ICD-10-CM | POA: Diagnosis not present

## 2017-01-08 DIAGNOSIS — N186 End stage renal disease: Secondary | ICD-10-CM | POA: Diagnosis not present

## 2017-01-08 DIAGNOSIS — N2581 Secondary hyperparathyroidism of renal origin: Secondary | ICD-10-CM | POA: Diagnosis not present

## 2017-01-08 DIAGNOSIS — E8809 Other disorders of plasma-protein metabolism, not elsewhere classified: Secondary | ICD-10-CM | POA: Diagnosis not present

## 2017-01-10 DIAGNOSIS — D631 Anemia in chronic kidney disease: Secondary | ICD-10-CM | POA: Diagnosis not present

## 2017-01-10 DIAGNOSIS — D500 Iron deficiency anemia secondary to blood loss (chronic): Secondary | ICD-10-CM | POA: Diagnosis not present

## 2017-01-10 DIAGNOSIS — E8809 Other disorders of plasma-protein metabolism, not elsewhere classified: Secondary | ICD-10-CM | POA: Diagnosis not present

## 2017-01-10 DIAGNOSIS — N2581 Secondary hyperparathyroidism of renal origin: Secondary | ICD-10-CM | POA: Diagnosis not present

## 2017-01-10 DIAGNOSIS — N186 End stage renal disease: Secondary | ICD-10-CM | POA: Diagnosis not present

## 2017-01-12 DIAGNOSIS — D631 Anemia in chronic kidney disease: Secondary | ICD-10-CM | POA: Diagnosis not present

## 2017-01-12 DIAGNOSIS — N2581 Secondary hyperparathyroidism of renal origin: Secondary | ICD-10-CM | POA: Diagnosis not present

## 2017-01-12 DIAGNOSIS — E8809 Other disorders of plasma-protein metabolism, not elsewhere classified: Secondary | ICD-10-CM | POA: Diagnosis not present

## 2017-01-12 DIAGNOSIS — N186 End stage renal disease: Secondary | ICD-10-CM | POA: Diagnosis not present

## 2017-01-12 DIAGNOSIS — D500 Iron deficiency anemia secondary to blood loss (chronic): Secondary | ICD-10-CM | POA: Diagnosis not present

## 2017-01-14 DIAGNOSIS — I10 Essential (primary) hypertension: Secondary | ICD-10-CM | POA: Diagnosis not present

## 2017-01-14 DIAGNOSIS — E119 Type 2 diabetes mellitus without complications: Secondary | ICD-10-CM | POA: Diagnosis not present

## 2017-01-15 DIAGNOSIS — N2581 Secondary hyperparathyroidism of renal origin: Secondary | ICD-10-CM | POA: Diagnosis not present

## 2017-01-15 DIAGNOSIS — D631 Anemia in chronic kidney disease: Secondary | ICD-10-CM | POA: Diagnosis not present

## 2017-01-15 DIAGNOSIS — N186 End stage renal disease: Secondary | ICD-10-CM | POA: Diagnosis not present

## 2017-01-15 DIAGNOSIS — E8809 Other disorders of plasma-protein metabolism, not elsewhere classified: Secondary | ICD-10-CM | POA: Diagnosis not present

## 2017-01-15 DIAGNOSIS — D500 Iron deficiency anemia secondary to blood loss (chronic): Secondary | ICD-10-CM | POA: Diagnosis not present

## 2017-01-17 DIAGNOSIS — N186 End stage renal disease: Secondary | ICD-10-CM | POA: Diagnosis not present

## 2017-01-17 DIAGNOSIS — D631 Anemia in chronic kidney disease: Secondary | ICD-10-CM | POA: Diagnosis not present

## 2017-01-17 DIAGNOSIS — E8809 Other disorders of plasma-protein metabolism, not elsewhere classified: Secondary | ICD-10-CM | POA: Diagnosis not present

## 2017-01-17 DIAGNOSIS — D500 Iron deficiency anemia secondary to blood loss (chronic): Secondary | ICD-10-CM | POA: Diagnosis not present

## 2017-01-17 DIAGNOSIS — N2581 Secondary hyperparathyroidism of renal origin: Secondary | ICD-10-CM | POA: Diagnosis not present

## 2017-01-19 DIAGNOSIS — D500 Iron deficiency anemia secondary to blood loss (chronic): Secondary | ICD-10-CM | POA: Diagnosis not present

## 2017-01-19 DIAGNOSIS — N2581 Secondary hyperparathyroidism of renal origin: Secondary | ICD-10-CM | POA: Diagnosis not present

## 2017-01-19 DIAGNOSIS — N186 End stage renal disease: Secondary | ICD-10-CM | POA: Diagnosis not present

## 2017-01-19 DIAGNOSIS — D631 Anemia in chronic kidney disease: Secondary | ICD-10-CM | POA: Diagnosis not present

## 2017-01-19 DIAGNOSIS — E8809 Other disorders of plasma-protein metabolism, not elsewhere classified: Secondary | ICD-10-CM | POA: Diagnosis not present

## 2017-01-22 DIAGNOSIS — D631 Anemia in chronic kidney disease: Secondary | ICD-10-CM | POA: Diagnosis not present

## 2017-01-22 DIAGNOSIS — N186 End stage renal disease: Secondary | ICD-10-CM | POA: Diagnosis not present

## 2017-01-22 DIAGNOSIS — E8809 Other disorders of plasma-protein metabolism, not elsewhere classified: Secondary | ICD-10-CM | POA: Diagnosis not present

## 2017-01-22 DIAGNOSIS — N2581 Secondary hyperparathyroidism of renal origin: Secondary | ICD-10-CM | POA: Diagnosis not present

## 2017-01-22 DIAGNOSIS — D500 Iron deficiency anemia secondary to blood loss (chronic): Secondary | ICD-10-CM | POA: Diagnosis not present

## 2017-01-24 DIAGNOSIS — N186 End stage renal disease: Secondary | ICD-10-CM | POA: Diagnosis not present

## 2017-01-24 DIAGNOSIS — N2581 Secondary hyperparathyroidism of renal origin: Secondary | ICD-10-CM | POA: Diagnosis not present

## 2017-01-24 DIAGNOSIS — D500 Iron deficiency anemia secondary to blood loss (chronic): Secondary | ICD-10-CM | POA: Diagnosis not present

## 2017-01-24 DIAGNOSIS — E8809 Other disorders of plasma-protein metabolism, not elsewhere classified: Secondary | ICD-10-CM | POA: Diagnosis not present

## 2017-01-24 DIAGNOSIS — D631 Anemia in chronic kidney disease: Secondary | ICD-10-CM | POA: Diagnosis not present

## 2017-01-26 DIAGNOSIS — N186 End stage renal disease: Secondary | ICD-10-CM | POA: Diagnosis not present

## 2017-01-26 DIAGNOSIS — D631 Anemia in chronic kidney disease: Secondary | ICD-10-CM | POA: Diagnosis not present

## 2017-01-26 DIAGNOSIS — N2581 Secondary hyperparathyroidism of renal origin: Secondary | ICD-10-CM | POA: Diagnosis not present

## 2017-01-26 DIAGNOSIS — D500 Iron deficiency anemia secondary to blood loss (chronic): Secondary | ICD-10-CM | POA: Diagnosis not present

## 2017-01-26 DIAGNOSIS — E8809 Other disorders of plasma-protein metabolism, not elsewhere classified: Secondary | ICD-10-CM | POA: Diagnosis not present

## 2017-01-29 DIAGNOSIS — E8809 Other disorders of plasma-protein metabolism, not elsewhere classified: Secondary | ICD-10-CM | POA: Diagnosis not present

## 2017-01-29 DIAGNOSIS — D500 Iron deficiency anemia secondary to blood loss (chronic): Secondary | ICD-10-CM | POA: Diagnosis not present

## 2017-01-29 DIAGNOSIS — N186 End stage renal disease: Secondary | ICD-10-CM | POA: Diagnosis not present

## 2017-01-29 DIAGNOSIS — N2581 Secondary hyperparathyroidism of renal origin: Secondary | ICD-10-CM | POA: Diagnosis not present

## 2017-01-29 DIAGNOSIS — D631 Anemia in chronic kidney disease: Secondary | ICD-10-CM | POA: Diagnosis not present

## 2017-01-30 DIAGNOSIS — Z992 Dependence on renal dialysis: Secondary | ICD-10-CM | POA: Diagnosis not present

## 2017-01-30 DIAGNOSIS — N186 End stage renal disease: Secondary | ICD-10-CM | POA: Diagnosis not present

## 2017-01-31 DIAGNOSIS — N2581 Secondary hyperparathyroidism of renal origin: Secondary | ICD-10-CM | POA: Diagnosis not present

## 2017-01-31 DIAGNOSIS — D631 Anemia in chronic kidney disease: Secondary | ICD-10-CM | POA: Diagnosis not present

## 2017-01-31 DIAGNOSIS — E8809 Other disorders of plasma-protein metabolism, not elsewhere classified: Secondary | ICD-10-CM | POA: Diagnosis not present

## 2017-01-31 DIAGNOSIS — D500 Iron deficiency anemia secondary to blood loss (chronic): Secondary | ICD-10-CM | POA: Diagnosis not present

## 2017-01-31 DIAGNOSIS — N186 End stage renal disease: Secondary | ICD-10-CM | POA: Diagnosis not present

## 2017-02-02 DIAGNOSIS — D631 Anemia in chronic kidney disease: Secondary | ICD-10-CM | POA: Diagnosis not present

## 2017-02-02 DIAGNOSIS — N186 End stage renal disease: Secondary | ICD-10-CM | POA: Diagnosis not present

## 2017-02-02 DIAGNOSIS — N2581 Secondary hyperparathyroidism of renal origin: Secondary | ICD-10-CM | POA: Diagnosis not present

## 2017-02-02 DIAGNOSIS — D500 Iron deficiency anemia secondary to blood loss (chronic): Secondary | ICD-10-CM | POA: Diagnosis not present

## 2017-02-02 DIAGNOSIS — E8809 Other disorders of plasma-protein metabolism, not elsewhere classified: Secondary | ICD-10-CM | POA: Diagnosis not present

## 2017-02-05 DIAGNOSIS — E8809 Other disorders of plasma-protein metabolism, not elsewhere classified: Secondary | ICD-10-CM | POA: Diagnosis not present

## 2017-02-05 DIAGNOSIS — N2581 Secondary hyperparathyroidism of renal origin: Secondary | ICD-10-CM | POA: Diagnosis not present

## 2017-02-05 DIAGNOSIS — D500 Iron deficiency anemia secondary to blood loss (chronic): Secondary | ICD-10-CM | POA: Diagnosis not present

## 2017-02-05 DIAGNOSIS — D631 Anemia in chronic kidney disease: Secondary | ICD-10-CM | POA: Diagnosis not present

## 2017-02-05 DIAGNOSIS — N186 End stage renal disease: Secondary | ICD-10-CM | POA: Diagnosis not present

## 2017-02-07 DIAGNOSIS — N186 End stage renal disease: Secondary | ICD-10-CM | POA: Diagnosis not present

## 2017-02-07 DIAGNOSIS — E8809 Other disorders of plasma-protein metabolism, not elsewhere classified: Secondary | ICD-10-CM | POA: Diagnosis not present

## 2017-02-07 DIAGNOSIS — D500 Iron deficiency anemia secondary to blood loss (chronic): Secondary | ICD-10-CM | POA: Diagnosis not present

## 2017-02-07 DIAGNOSIS — D631 Anemia in chronic kidney disease: Secondary | ICD-10-CM | POA: Diagnosis not present

## 2017-02-07 DIAGNOSIS — N2581 Secondary hyperparathyroidism of renal origin: Secondary | ICD-10-CM | POA: Diagnosis not present

## 2017-02-08 DIAGNOSIS — Z0000 Encounter for general adult medical examination without abnormal findings: Secondary | ICD-10-CM | POA: Diagnosis not present

## 2017-02-08 DIAGNOSIS — Z299 Encounter for prophylactic measures, unspecified: Secondary | ICD-10-CM | POA: Diagnosis not present

## 2017-02-08 DIAGNOSIS — E7800 Pure hypercholesterolemia, unspecified: Secondary | ICD-10-CM | POA: Diagnosis not present

## 2017-02-08 DIAGNOSIS — E1122 Type 2 diabetes mellitus with diabetic chronic kidney disease: Secondary | ICD-10-CM | POA: Diagnosis not present

## 2017-02-08 DIAGNOSIS — Z1339 Encounter for screening examination for other mental health and behavioral disorders: Secondary | ICD-10-CM | POA: Diagnosis not present

## 2017-02-08 DIAGNOSIS — Z992 Dependence on renal dialysis: Secondary | ICD-10-CM | POA: Diagnosis not present

## 2017-02-08 DIAGNOSIS — Z7189 Other specified counseling: Secondary | ICD-10-CM | POA: Diagnosis not present

## 2017-02-08 DIAGNOSIS — E1165 Type 2 diabetes mellitus with hyperglycemia: Secondary | ICD-10-CM | POA: Diagnosis not present

## 2017-02-08 DIAGNOSIS — N186 End stage renal disease: Secondary | ICD-10-CM | POA: Diagnosis not present

## 2017-02-08 DIAGNOSIS — Z6826 Body mass index (BMI) 26.0-26.9, adult: Secondary | ICD-10-CM | POA: Diagnosis not present

## 2017-02-08 DIAGNOSIS — Z1211 Encounter for screening for malignant neoplasm of colon: Secondary | ICD-10-CM | POA: Diagnosis not present

## 2017-02-08 DIAGNOSIS — Z1331 Encounter for screening for depression: Secondary | ICD-10-CM | POA: Diagnosis not present

## 2017-02-09 DIAGNOSIS — N186 End stage renal disease: Secondary | ICD-10-CM | POA: Diagnosis not present

## 2017-02-09 DIAGNOSIS — D631 Anemia in chronic kidney disease: Secondary | ICD-10-CM | POA: Diagnosis not present

## 2017-02-09 DIAGNOSIS — E8809 Other disorders of plasma-protein metabolism, not elsewhere classified: Secondary | ICD-10-CM | POA: Diagnosis not present

## 2017-02-09 DIAGNOSIS — N2581 Secondary hyperparathyroidism of renal origin: Secondary | ICD-10-CM | POA: Diagnosis not present

## 2017-02-09 DIAGNOSIS — D500 Iron deficiency anemia secondary to blood loss (chronic): Secondary | ICD-10-CM | POA: Diagnosis not present

## 2017-02-12 DIAGNOSIS — D631 Anemia in chronic kidney disease: Secondary | ICD-10-CM | POA: Diagnosis not present

## 2017-02-12 DIAGNOSIS — D500 Iron deficiency anemia secondary to blood loss (chronic): Secondary | ICD-10-CM | POA: Diagnosis not present

## 2017-02-12 DIAGNOSIS — N2581 Secondary hyperparathyroidism of renal origin: Secondary | ICD-10-CM | POA: Diagnosis not present

## 2017-02-12 DIAGNOSIS — E8809 Other disorders of plasma-protein metabolism, not elsewhere classified: Secondary | ICD-10-CM | POA: Diagnosis not present

## 2017-02-12 DIAGNOSIS — N186 End stage renal disease: Secondary | ICD-10-CM | POA: Diagnosis not present

## 2017-02-16 DIAGNOSIS — D500 Iron deficiency anemia secondary to blood loss (chronic): Secondary | ICD-10-CM | POA: Diagnosis not present

## 2017-02-16 DIAGNOSIS — D631 Anemia in chronic kidney disease: Secondary | ICD-10-CM | POA: Diagnosis not present

## 2017-02-16 DIAGNOSIS — N2581 Secondary hyperparathyroidism of renal origin: Secondary | ICD-10-CM | POA: Diagnosis not present

## 2017-02-16 DIAGNOSIS — N186 End stage renal disease: Secondary | ICD-10-CM | POA: Diagnosis not present

## 2017-02-16 DIAGNOSIS — E8809 Other disorders of plasma-protein metabolism, not elsewhere classified: Secondary | ICD-10-CM | POA: Diagnosis not present

## 2017-02-18 DIAGNOSIS — D631 Anemia in chronic kidney disease: Secondary | ICD-10-CM | POA: Diagnosis not present

## 2017-02-18 DIAGNOSIS — N2581 Secondary hyperparathyroidism of renal origin: Secondary | ICD-10-CM | POA: Diagnosis not present

## 2017-02-18 DIAGNOSIS — N186 End stage renal disease: Secondary | ICD-10-CM | POA: Diagnosis not present

## 2017-02-18 DIAGNOSIS — D500 Iron deficiency anemia secondary to blood loss (chronic): Secondary | ICD-10-CM | POA: Diagnosis not present

## 2017-02-18 DIAGNOSIS — E8809 Other disorders of plasma-protein metabolism, not elsewhere classified: Secondary | ICD-10-CM | POA: Diagnosis not present

## 2017-02-20 DIAGNOSIS — D631 Anemia in chronic kidney disease: Secondary | ICD-10-CM | POA: Diagnosis not present

## 2017-02-20 DIAGNOSIS — D500 Iron deficiency anemia secondary to blood loss (chronic): Secondary | ICD-10-CM | POA: Diagnosis not present

## 2017-02-20 DIAGNOSIS — N186 End stage renal disease: Secondary | ICD-10-CM | POA: Diagnosis not present

## 2017-02-20 DIAGNOSIS — E8809 Other disorders of plasma-protein metabolism, not elsewhere classified: Secondary | ICD-10-CM | POA: Diagnosis not present

## 2017-02-20 DIAGNOSIS — N2581 Secondary hyperparathyroidism of renal origin: Secondary | ICD-10-CM | POA: Diagnosis not present

## 2017-02-23 DIAGNOSIS — N2581 Secondary hyperparathyroidism of renal origin: Secondary | ICD-10-CM | POA: Diagnosis not present

## 2017-02-23 DIAGNOSIS — E8809 Other disorders of plasma-protein metabolism, not elsewhere classified: Secondary | ICD-10-CM | POA: Diagnosis not present

## 2017-02-23 DIAGNOSIS — D500 Iron deficiency anemia secondary to blood loss (chronic): Secondary | ICD-10-CM | POA: Diagnosis not present

## 2017-02-23 DIAGNOSIS — N186 End stage renal disease: Secondary | ICD-10-CM | POA: Diagnosis not present

## 2017-02-23 DIAGNOSIS — D631 Anemia in chronic kidney disease: Secondary | ICD-10-CM | POA: Diagnosis not present

## 2017-02-26 DIAGNOSIS — D631 Anemia in chronic kidney disease: Secondary | ICD-10-CM | POA: Diagnosis not present

## 2017-02-26 DIAGNOSIS — D500 Iron deficiency anemia secondary to blood loss (chronic): Secondary | ICD-10-CM | POA: Diagnosis not present

## 2017-02-26 DIAGNOSIS — E8809 Other disorders of plasma-protein metabolism, not elsewhere classified: Secondary | ICD-10-CM | POA: Diagnosis not present

## 2017-02-26 DIAGNOSIS — N2581 Secondary hyperparathyroidism of renal origin: Secondary | ICD-10-CM | POA: Diagnosis not present

## 2017-02-26 DIAGNOSIS — N186 End stage renal disease: Secondary | ICD-10-CM | POA: Diagnosis not present

## 2017-02-28 DIAGNOSIS — E119 Type 2 diabetes mellitus without complications: Secondary | ICD-10-CM | POA: Diagnosis not present

## 2017-02-28 DIAGNOSIS — N186 End stage renal disease: Secondary | ICD-10-CM | POA: Diagnosis not present

## 2017-02-28 DIAGNOSIS — I10 Essential (primary) hypertension: Secondary | ICD-10-CM | POA: Diagnosis not present

## 2017-02-28 DIAGNOSIS — D631 Anemia in chronic kidney disease: Secondary | ICD-10-CM | POA: Diagnosis not present

## 2017-02-28 DIAGNOSIS — E8809 Other disorders of plasma-protein metabolism, not elsewhere classified: Secondary | ICD-10-CM | POA: Diagnosis not present

## 2017-02-28 DIAGNOSIS — D500 Iron deficiency anemia secondary to blood loss (chronic): Secondary | ICD-10-CM | POA: Diagnosis not present

## 2017-02-28 DIAGNOSIS — N2581 Secondary hyperparathyroidism of renal origin: Secondary | ICD-10-CM | POA: Diagnosis not present

## 2017-03-01 DIAGNOSIS — Z992 Dependence on renal dialysis: Secondary | ICD-10-CM | POA: Diagnosis not present

## 2017-03-01 DIAGNOSIS — N186 End stage renal disease: Secondary | ICD-10-CM | POA: Diagnosis not present

## 2017-03-02 DIAGNOSIS — N2581 Secondary hyperparathyroidism of renal origin: Secondary | ICD-10-CM | POA: Diagnosis not present

## 2017-03-02 DIAGNOSIS — D500 Iron deficiency anemia secondary to blood loss (chronic): Secondary | ICD-10-CM | POA: Diagnosis not present

## 2017-03-02 DIAGNOSIS — D6959 Other secondary thrombocytopenia: Secondary | ICD-10-CM | POA: Diagnosis not present

## 2017-03-02 DIAGNOSIS — E8809 Other disorders of plasma-protein metabolism, not elsewhere classified: Secondary | ICD-10-CM | POA: Diagnosis not present

## 2017-03-02 DIAGNOSIS — N186 End stage renal disease: Secondary | ICD-10-CM | POA: Diagnosis not present

## 2017-03-02 DIAGNOSIS — D631 Anemia in chronic kidney disease: Secondary | ICD-10-CM | POA: Diagnosis not present

## 2017-03-05 DIAGNOSIS — E8809 Other disorders of plasma-protein metabolism, not elsewhere classified: Secondary | ICD-10-CM | POA: Diagnosis not present

## 2017-03-05 DIAGNOSIS — N2581 Secondary hyperparathyroidism of renal origin: Secondary | ICD-10-CM | POA: Diagnosis not present

## 2017-03-05 DIAGNOSIS — D500 Iron deficiency anemia secondary to blood loss (chronic): Secondary | ICD-10-CM | POA: Diagnosis not present

## 2017-03-05 DIAGNOSIS — N186 End stage renal disease: Secondary | ICD-10-CM | POA: Diagnosis not present

## 2017-03-05 DIAGNOSIS — D631 Anemia in chronic kidney disease: Secondary | ICD-10-CM | POA: Diagnosis not present

## 2017-03-07 DIAGNOSIS — D631 Anemia in chronic kidney disease: Secondary | ICD-10-CM | POA: Diagnosis not present

## 2017-03-07 DIAGNOSIS — N186 End stage renal disease: Secondary | ICD-10-CM | POA: Diagnosis not present

## 2017-03-07 DIAGNOSIS — N2581 Secondary hyperparathyroidism of renal origin: Secondary | ICD-10-CM | POA: Diagnosis not present

## 2017-03-07 DIAGNOSIS — D500 Iron deficiency anemia secondary to blood loss (chronic): Secondary | ICD-10-CM | POA: Diagnosis not present

## 2017-03-07 DIAGNOSIS — E8809 Other disorders of plasma-protein metabolism, not elsewhere classified: Secondary | ICD-10-CM | POA: Diagnosis not present

## 2017-03-09 DIAGNOSIS — N186 End stage renal disease: Secondary | ICD-10-CM | POA: Diagnosis not present

## 2017-03-09 DIAGNOSIS — D631 Anemia in chronic kidney disease: Secondary | ICD-10-CM | POA: Diagnosis not present

## 2017-03-09 DIAGNOSIS — N2581 Secondary hyperparathyroidism of renal origin: Secondary | ICD-10-CM | POA: Diagnosis not present

## 2017-03-09 DIAGNOSIS — E8809 Other disorders of plasma-protein metabolism, not elsewhere classified: Secondary | ICD-10-CM | POA: Diagnosis not present

## 2017-03-09 DIAGNOSIS — D500 Iron deficiency anemia secondary to blood loss (chronic): Secondary | ICD-10-CM | POA: Diagnosis not present

## 2017-03-12 DIAGNOSIS — E8809 Other disorders of plasma-protein metabolism, not elsewhere classified: Secondary | ICD-10-CM | POA: Diagnosis not present

## 2017-03-12 DIAGNOSIS — D631 Anemia in chronic kidney disease: Secondary | ICD-10-CM | POA: Diagnosis not present

## 2017-03-12 DIAGNOSIS — N186 End stage renal disease: Secondary | ICD-10-CM | POA: Diagnosis not present

## 2017-03-12 DIAGNOSIS — D500 Iron deficiency anemia secondary to blood loss (chronic): Secondary | ICD-10-CM | POA: Diagnosis not present

## 2017-03-12 DIAGNOSIS — N2581 Secondary hyperparathyroidism of renal origin: Secondary | ICD-10-CM | POA: Diagnosis not present

## 2017-03-14 DIAGNOSIS — N2581 Secondary hyperparathyroidism of renal origin: Secondary | ICD-10-CM | POA: Diagnosis not present

## 2017-03-14 DIAGNOSIS — N186 End stage renal disease: Secondary | ICD-10-CM | POA: Diagnosis not present

## 2017-03-14 DIAGNOSIS — E1129 Type 2 diabetes mellitus with other diabetic kidney complication: Secondary | ICD-10-CM | POA: Diagnosis not present

## 2017-03-14 DIAGNOSIS — D631 Anemia in chronic kidney disease: Secondary | ICD-10-CM | POA: Diagnosis not present

## 2017-03-14 DIAGNOSIS — E8809 Other disorders of plasma-protein metabolism, not elsewhere classified: Secondary | ICD-10-CM | POA: Diagnosis not present

## 2017-03-14 DIAGNOSIS — D500 Iron deficiency anemia secondary to blood loss (chronic): Secondary | ICD-10-CM | POA: Diagnosis not present

## 2017-03-16 DIAGNOSIS — D500 Iron deficiency anemia secondary to blood loss (chronic): Secondary | ICD-10-CM | POA: Diagnosis not present

## 2017-03-16 DIAGNOSIS — N2581 Secondary hyperparathyroidism of renal origin: Secondary | ICD-10-CM | POA: Diagnosis not present

## 2017-03-16 DIAGNOSIS — N186 End stage renal disease: Secondary | ICD-10-CM | POA: Diagnosis not present

## 2017-03-16 DIAGNOSIS — D631 Anemia in chronic kidney disease: Secondary | ICD-10-CM | POA: Diagnosis not present

## 2017-03-16 DIAGNOSIS — E8809 Other disorders of plasma-protein metabolism, not elsewhere classified: Secondary | ICD-10-CM | POA: Diagnosis not present

## 2017-03-19 DIAGNOSIS — N186 End stage renal disease: Secondary | ICD-10-CM | POA: Diagnosis not present

## 2017-03-19 DIAGNOSIS — D500 Iron deficiency anemia secondary to blood loss (chronic): Secondary | ICD-10-CM | POA: Diagnosis not present

## 2017-03-19 DIAGNOSIS — E8809 Other disorders of plasma-protein metabolism, not elsewhere classified: Secondary | ICD-10-CM | POA: Diagnosis not present

## 2017-03-19 DIAGNOSIS — D631 Anemia in chronic kidney disease: Secondary | ICD-10-CM | POA: Diagnosis not present

## 2017-03-19 DIAGNOSIS — N2581 Secondary hyperparathyroidism of renal origin: Secondary | ICD-10-CM | POA: Diagnosis not present

## 2017-03-21 DIAGNOSIS — N2581 Secondary hyperparathyroidism of renal origin: Secondary | ICD-10-CM | POA: Diagnosis not present

## 2017-03-21 DIAGNOSIS — D500 Iron deficiency anemia secondary to blood loss (chronic): Secondary | ICD-10-CM | POA: Diagnosis not present

## 2017-03-21 DIAGNOSIS — D631 Anemia in chronic kidney disease: Secondary | ICD-10-CM | POA: Diagnosis not present

## 2017-03-21 DIAGNOSIS — E8809 Other disorders of plasma-protein metabolism, not elsewhere classified: Secondary | ICD-10-CM | POA: Diagnosis not present

## 2017-03-21 DIAGNOSIS — N186 End stage renal disease: Secondary | ICD-10-CM | POA: Diagnosis not present

## 2017-03-23 DIAGNOSIS — N2581 Secondary hyperparathyroidism of renal origin: Secondary | ICD-10-CM | POA: Diagnosis not present

## 2017-03-23 DIAGNOSIS — E8809 Other disorders of plasma-protein metabolism, not elsewhere classified: Secondary | ICD-10-CM | POA: Diagnosis not present

## 2017-03-23 DIAGNOSIS — D500 Iron deficiency anemia secondary to blood loss (chronic): Secondary | ICD-10-CM | POA: Diagnosis not present

## 2017-03-23 DIAGNOSIS — D631 Anemia in chronic kidney disease: Secondary | ICD-10-CM | POA: Diagnosis not present

## 2017-03-23 DIAGNOSIS — N186 End stage renal disease: Secondary | ICD-10-CM | POA: Diagnosis not present

## 2017-03-25 DIAGNOSIS — D631 Anemia in chronic kidney disease: Secondary | ICD-10-CM | POA: Diagnosis not present

## 2017-03-25 DIAGNOSIS — D500 Iron deficiency anemia secondary to blood loss (chronic): Secondary | ICD-10-CM | POA: Diagnosis not present

## 2017-03-25 DIAGNOSIS — N2581 Secondary hyperparathyroidism of renal origin: Secondary | ICD-10-CM | POA: Diagnosis not present

## 2017-03-25 DIAGNOSIS — E8809 Other disorders of plasma-protein metabolism, not elsewhere classified: Secondary | ICD-10-CM | POA: Diagnosis not present

## 2017-03-25 DIAGNOSIS — N186 End stage renal disease: Secondary | ICD-10-CM | POA: Diagnosis not present

## 2017-03-28 DIAGNOSIS — D631 Anemia in chronic kidney disease: Secondary | ICD-10-CM | POA: Diagnosis not present

## 2017-03-28 DIAGNOSIS — N2581 Secondary hyperparathyroidism of renal origin: Secondary | ICD-10-CM | POA: Diagnosis not present

## 2017-03-28 DIAGNOSIS — E119 Type 2 diabetes mellitus without complications: Secondary | ICD-10-CM | POA: Diagnosis not present

## 2017-03-28 DIAGNOSIS — N186 End stage renal disease: Secondary | ICD-10-CM | POA: Diagnosis not present

## 2017-03-28 DIAGNOSIS — I10 Essential (primary) hypertension: Secondary | ICD-10-CM | POA: Diagnosis not present

## 2017-03-28 DIAGNOSIS — E8809 Other disorders of plasma-protein metabolism, not elsewhere classified: Secondary | ICD-10-CM | POA: Diagnosis not present

## 2017-03-28 DIAGNOSIS — D500 Iron deficiency anemia secondary to blood loss (chronic): Secondary | ICD-10-CM | POA: Diagnosis not present

## 2017-03-30 DIAGNOSIS — N186 End stage renal disease: Secondary | ICD-10-CM | POA: Diagnosis not present

## 2017-03-30 DIAGNOSIS — E8809 Other disorders of plasma-protein metabolism, not elsewhere classified: Secondary | ICD-10-CM | POA: Diagnosis not present

## 2017-03-30 DIAGNOSIS — D631 Anemia in chronic kidney disease: Secondary | ICD-10-CM | POA: Diagnosis not present

## 2017-03-30 DIAGNOSIS — N2581 Secondary hyperparathyroidism of renal origin: Secondary | ICD-10-CM | POA: Diagnosis not present

## 2017-03-30 DIAGNOSIS — D500 Iron deficiency anemia secondary to blood loss (chronic): Secondary | ICD-10-CM | POA: Diagnosis not present

## 2017-04-01 DIAGNOSIS — D631 Anemia in chronic kidney disease: Secondary | ICD-10-CM | POA: Diagnosis not present

## 2017-04-01 DIAGNOSIS — N186 End stage renal disease: Secondary | ICD-10-CM | POA: Diagnosis not present

## 2017-04-01 DIAGNOSIS — Z992 Dependence on renal dialysis: Secondary | ICD-10-CM | POA: Diagnosis not present

## 2017-04-01 DIAGNOSIS — N2581 Secondary hyperparathyroidism of renal origin: Secondary | ICD-10-CM | POA: Diagnosis not present

## 2017-04-01 DIAGNOSIS — D500 Iron deficiency anemia secondary to blood loss (chronic): Secondary | ICD-10-CM | POA: Diagnosis not present

## 2017-04-01 DIAGNOSIS — E8809 Other disorders of plasma-protein metabolism, not elsewhere classified: Secondary | ICD-10-CM | POA: Diagnosis not present

## 2017-04-03 DIAGNOSIS — Z992 Dependence on renal dialysis: Secondary | ICD-10-CM | POA: Diagnosis not present

## 2017-04-03 DIAGNOSIS — E1122 Type 2 diabetes mellitus with diabetic chronic kidney disease: Secondary | ICD-10-CM | POA: Diagnosis not present

## 2017-04-03 DIAGNOSIS — E1165 Type 2 diabetes mellitus with hyperglycemia: Secondary | ICD-10-CM | POA: Diagnosis not present

## 2017-04-03 DIAGNOSIS — Z6826 Body mass index (BMI) 26.0-26.9, adult: Secondary | ICD-10-CM | POA: Diagnosis not present

## 2017-04-03 DIAGNOSIS — J61 Pneumoconiosis due to asbestos and other mineral fibers: Secondary | ICD-10-CM | POA: Diagnosis not present

## 2017-04-03 DIAGNOSIS — Z299 Encounter for prophylactic measures, unspecified: Secondary | ICD-10-CM | POA: Diagnosis not present

## 2017-04-03 DIAGNOSIS — Z789 Other specified health status: Secondary | ICD-10-CM | POA: Diagnosis not present

## 2017-04-03 DIAGNOSIS — N186 End stage renal disease: Secondary | ICD-10-CM | POA: Diagnosis not present

## 2017-04-04 DIAGNOSIS — N2581 Secondary hyperparathyroidism of renal origin: Secondary | ICD-10-CM | POA: Diagnosis not present

## 2017-04-04 DIAGNOSIS — D631 Anemia in chronic kidney disease: Secondary | ICD-10-CM | POA: Diagnosis not present

## 2017-04-04 DIAGNOSIS — Z139 Encounter for screening, unspecified: Secondary | ICD-10-CM | POA: Diagnosis not present

## 2017-04-04 DIAGNOSIS — D500 Iron deficiency anemia secondary to blood loss (chronic): Secondary | ICD-10-CM | POA: Diagnosis not present

## 2017-04-04 DIAGNOSIS — N186 End stage renal disease: Secondary | ICD-10-CM | POA: Diagnosis not present

## 2017-04-04 DIAGNOSIS — E8809 Other disorders of plasma-protein metabolism, not elsewhere classified: Secondary | ICD-10-CM | POA: Diagnosis not present

## 2017-04-06 DIAGNOSIS — D500 Iron deficiency anemia secondary to blood loss (chronic): Secondary | ICD-10-CM | POA: Diagnosis not present

## 2017-04-06 DIAGNOSIS — N186 End stage renal disease: Secondary | ICD-10-CM | POA: Diagnosis not present

## 2017-04-06 DIAGNOSIS — E8809 Other disorders of plasma-protein metabolism, not elsewhere classified: Secondary | ICD-10-CM | POA: Diagnosis not present

## 2017-04-06 DIAGNOSIS — N2581 Secondary hyperparathyroidism of renal origin: Secondary | ICD-10-CM | POA: Diagnosis not present

## 2017-04-06 DIAGNOSIS — D631 Anemia in chronic kidney disease: Secondary | ICD-10-CM | POA: Diagnosis not present

## 2017-04-09 DIAGNOSIS — D631 Anemia in chronic kidney disease: Secondary | ICD-10-CM | POA: Diagnosis not present

## 2017-04-09 DIAGNOSIS — D500 Iron deficiency anemia secondary to blood loss (chronic): Secondary | ICD-10-CM | POA: Diagnosis not present

## 2017-04-09 DIAGNOSIS — N2581 Secondary hyperparathyroidism of renal origin: Secondary | ICD-10-CM | POA: Diagnosis not present

## 2017-04-09 DIAGNOSIS — E8809 Other disorders of plasma-protein metabolism, not elsewhere classified: Secondary | ICD-10-CM | POA: Diagnosis not present

## 2017-04-09 DIAGNOSIS — N186 End stage renal disease: Secondary | ICD-10-CM | POA: Diagnosis not present

## 2017-04-11 DIAGNOSIS — E8809 Other disorders of plasma-protein metabolism, not elsewhere classified: Secondary | ICD-10-CM | POA: Diagnosis not present

## 2017-04-11 DIAGNOSIS — D500 Iron deficiency anemia secondary to blood loss (chronic): Secondary | ICD-10-CM | POA: Diagnosis not present

## 2017-04-11 DIAGNOSIS — D631 Anemia in chronic kidney disease: Secondary | ICD-10-CM | POA: Diagnosis not present

## 2017-04-11 DIAGNOSIS — N2581 Secondary hyperparathyroidism of renal origin: Secondary | ICD-10-CM | POA: Diagnosis not present

## 2017-04-11 DIAGNOSIS — N186 End stage renal disease: Secondary | ICD-10-CM | POA: Diagnosis not present

## 2017-04-11 DIAGNOSIS — E7849 Other hyperlipidemia: Secondary | ICD-10-CM | POA: Diagnosis not present

## 2017-04-13 DIAGNOSIS — D631 Anemia in chronic kidney disease: Secondary | ICD-10-CM | POA: Diagnosis not present

## 2017-04-13 DIAGNOSIS — N2581 Secondary hyperparathyroidism of renal origin: Secondary | ICD-10-CM | POA: Diagnosis not present

## 2017-04-13 DIAGNOSIS — E8809 Other disorders of plasma-protein metabolism, not elsewhere classified: Secondary | ICD-10-CM | POA: Diagnosis not present

## 2017-04-13 DIAGNOSIS — D500 Iron deficiency anemia secondary to blood loss (chronic): Secondary | ICD-10-CM | POA: Diagnosis not present

## 2017-04-13 DIAGNOSIS — N186 End stage renal disease: Secondary | ICD-10-CM | POA: Diagnosis not present

## 2017-04-16 DIAGNOSIS — D631 Anemia in chronic kidney disease: Secondary | ICD-10-CM | POA: Diagnosis not present

## 2017-04-16 DIAGNOSIS — N186 End stage renal disease: Secondary | ICD-10-CM | POA: Diagnosis not present

## 2017-04-16 DIAGNOSIS — E8809 Other disorders of plasma-protein metabolism, not elsewhere classified: Secondary | ICD-10-CM | POA: Diagnosis not present

## 2017-04-16 DIAGNOSIS — N2581 Secondary hyperparathyroidism of renal origin: Secondary | ICD-10-CM | POA: Diagnosis not present

## 2017-04-16 DIAGNOSIS — D500 Iron deficiency anemia secondary to blood loss (chronic): Secondary | ICD-10-CM | POA: Diagnosis not present

## 2017-04-17 DIAGNOSIS — E1165 Type 2 diabetes mellitus with hyperglycemia: Secondary | ICD-10-CM | POA: Diagnosis not present

## 2017-04-17 DIAGNOSIS — N186 End stage renal disease: Secondary | ICD-10-CM | POA: Diagnosis not present

## 2017-04-17 DIAGNOSIS — Z789 Other specified health status: Secondary | ICD-10-CM | POA: Diagnosis not present

## 2017-04-17 DIAGNOSIS — Z299 Encounter for prophylactic measures, unspecified: Secondary | ICD-10-CM | POA: Diagnosis not present

## 2017-04-17 DIAGNOSIS — E1122 Type 2 diabetes mellitus with diabetic chronic kidney disease: Secondary | ICD-10-CM | POA: Diagnosis not present

## 2017-04-17 DIAGNOSIS — Z992 Dependence on renal dialysis: Secondary | ICD-10-CM | POA: Diagnosis not present

## 2017-04-17 DIAGNOSIS — I10 Essential (primary) hypertension: Secondary | ICD-10-CM | POA: Diagnosis not present

## 2017-04-17 DIAGNOSIS — Z6826 Body mass index (BMI) 26.0-26.9, adult: Secondary | ICD-10-CM | POA: Diagnosis not present

## 2017-04-18 DIAGNOSIS — N2581 Secondary hyperparathyroidism of renal origin: Secondary | ICD-10-CM | POA: Diagnosis not present

## 2017-04-18 DIAGNOSIS — D500 Iron deficiency anemia secondary to blood loss (chronic): Secondary | ICD-10-CM | POA: Diagnosis not present

## 2017-04-18 DIAGNOSIS — D631 Anemia in chronic kidney disease: Secondary | ICD-10-CM | POA: Diagnosis not present

## 2017-04-18 DIAGNOSIS — N186 End stage renal disease: Secondary | ICD-10-CM | POA: Diagnosis not present

## 2017-04-18 DIAGNOSIS — E8809 Other disorders of plasma-protein metabolism, not elsewhere classified: Secondary | ICD-10-CM | POA: Diagnosis not present

## 2017-04-20 DIAGNOSIS — N2581 Secondary hyperparathyroidism of renal origin: Secondary | ICD-10-CM | POA: Diagnosis not present

## 2017-04-20 DIAGNOSIS — D500 Iron deficiency anemia secondary to blood loss (chronic): Secondary | ICD-10-CM | POA: Diagnosis not present

## 2017-04-20 DIAGNOSIS — E8809 Other disorders of plasma-protein metabolism, not elsewhere classified: Secondary | ICD-10-CM | POA: Diagnosis not present

## 2017-04-20 DIAGNOSIS — D631 Anemia in chronic kidney disease: Secondary | ICD-10-CM | POA: Diagnosis not present

## 2017-04-20 DIAGNOSIS — N186 End stage renal disease: Secondary | ICD-10-CM | POA: Diagnosis not present

## 2017-04-22 DIAGNOSIS — R0989 Other specified symptoms and signs involving the circulatory and respiratory systems: Secondary | ICD-10-CM | POA: Diagnosis not present

## 2017-04-23 DIAGNOSIS — D631 Anemia in chronic kidney disease: Secondary | ICD-10-CM | POA: Diagnosis not present

## 2017-04-23 DIAGNOSIS — D500 Iron deficiency anemia secondary to blood loss (chronic): Secondary | ICD-10-CM | POA: Diagnosis not present

## 2017-04-23 DIAGNOSIS — E8809 Other disorders of plasma-protein metabolism, not elsewhere classified: Secondary | ICD-10-CM | POA: Diagnosis not present

## 2017-04-23 DIAGNOSIS — N2581 Secondary hyperparathyroidism of renal origin: Secondary | ICD-10-CM | POA: Diagnosis not present

## 2017-04-23 DIAGNOSIS — N186 End stage renal disease: Secondary | ICD-10-CM | POA: Diagnosis not present

## 2017-04-25 DIAGNOSIS — D631 Anemia in chronic kidney disease: Secondary | ICD-10-CM | POA: Diagnosis not present

## 2017-04-25 DIAGNOSIS — E8809 Other disorders of plasma-protein metabolism, not elsewhere classified: Secondary | ICD-10-CM | POA: Diagnosis not present

## 2017-04-25 DIAGNOSIS — N186 End stage renal disease: Secondary | ICD-10-CM | POA: Diagnosis not present

## 2017-04-25 DIAGNOSIS — N2581 Secondary hyperparathyroidism of renal origin: Secondary | ICD-10-CM | POA: Diagnosis not present

## 2017-04-25 DIAGNOSIS — D500 Iron deficiency anemia secondary to blood loss (chronic): Secondary | ICD-10-CM | POA: Diagnosis not present

## 2017-04-27 DIAGNOSIS — D500 Iron deficiency anemia secondary to blood loss (chronic): Secondary | ICD-10-CM | POA: Diagnosis not present

## 2017-04-27 DIAGNOSIS — N2581 Secondary hyperparathyroidism of renal origin: Secondary | ICD-10-CM | POA: Diagnosis not present

## 2017-04-27 DIAGNOSIS — N186 End stage renal disease: Secondary | ICD-10-CM | POA: Diagnosis not present

## 2017-04-27 DIAGNOSIS — D631 Anemia in chronic kidney disease: Secondary | ICD-10-CM | POA: Diagnosis not present

## 2017-04-27 DIAGNOSIS — E8809 Other disorders of plasma-protein metabolism, not elsewhere classified: Secondary | ICD-10-CM | POA: Diagnosis not present

## 2017-04-30 DIAGNOSIS — N2581 Secondary hyperparathyroidism of renal origin: Secondary | ICD-10-CM | POA: Diagnosis not present

## 2017-04-30 DIAGNOSIS — N186 End stage renal disease: Secondary | ICD-10-CM | POA: Diagnosis not present

## 2017-04-30 DIAGNOSIS — D631 Anemia in chronic kidney disease: Secondary | ICD-10-CM | POA: Diagnosis not present

## 2017-04-30 DIAGNOSIS — E8809 Other disorders of plasma-protein metabolism, not elsewhere classified: Secondary | ICD-10-CM | POA: Diagnosis not present

## 2017-04-30 DIAGNOSIS — D500 Iron deficiency anemia secondary to blood loss (chronic): Secondary | ICD-10-CM | POA: Diagnosis not present

## 2017-05-02 DIAGNOSIS — N2581 Secondary hyperparathyroidism of renal origin: Secondary | ICD-10-CM | POA: Diagnosis not present

## 2017-05-02 DIAGNOSIS — Z992 Dependence on renal dialysis: Secondary | ICD-10-CM | POA: Diagnosis not present

## 2017-05-02 DIAGNOSIS — D500 Iron deficiency anemia secondary to blood loss (chronic): Secondary | ICD-10-CM | POA: Diagnosis not present

## 2017-05-02 DIAGNOSIS — E8809 Other disorders of plasma-protein metabolism, not elsewhere classified: Secondary | ICD-10-CM | POA: Diagnosis not present

## 2017-05-02 DIAGNOSIS — D631 Anemia in chronic kidney disease: Secondary | ICD-10-CM | POA: Diagnosis not present

## 2017-05-02 DIAGNOSIS — N186 End stage renal disease: Secondary | ICD-10-CM | POA: Diagnosis not present

## 2017-05-04 DIAGNOSIS — Z23 Encounter for immunization: Secondary | ICD-10-CM | POA: Diagnosis not present

## 2017-05-04 DIAGNOSIS — N186 End stage renal disease: Secondary | ICD-10-CM | POA: Diagnosis not present

## 2017-05-04 DIAGNOSIS — E8809 Other disorders of plasma-protein metabolism, not elsewhere classified: Secondary | ICD-10-CM | POA: Diagnosis not present

## 2017-05-04 DIAGNOSIS — D500 Iron deficiency anemia secondary to blood loss (chronic): Secondary | ICD-10-CM | POA: Diagnosis not present

## 2017-05-04 DIAGNOSIS — N2581 Secondary hyperparathyroidism of renal origin: Secondary | ICD-10-CM | POA: Diagnosis not present

## 2017-05-04 DIAGNOSIS — D631 Anemia in chronic kidney disease: Secondary | ICD-10-CM | POA: Diagnosis not present

## 2017-05-07 DIAGNOSIS — N186 End stage renal disease: Secondary | ICD-10-CM | POA: Diagnosis not present

## 2017-05-07 DIAGNOSIS — D631 Anemia in chronic kidney disease: Secondary | ICD-10-CM | POA: Diagnosis not present

## 2017-05-07 DIAGNOSIS — N2581 Secondary hyperparathyroidism of renal origin: Secondary | ICD-10-CM | POA: Diagnosis not present

## 2017-05-07 DIAGNOSIS — E8809 Other disorders of plasma-protein metabolism, not elsewhere classified: Secondary | ICD-10-CM | POA: Diagnosis not present

## 2017-05-07 DIAGNOSIS — D500 Iron deficiency anemia secondary to blood loss (chronic): Secondary | ICD-10-CM | POA: Diagnosis not present

## 2017-05-09 DIAGNOSIS — D631 Anemia in chronic kidney disease: Secondary | ICD-10-CM | POA: Diagnosis not present

## 2017-05-09 DIAGNOSIS — N186 End stage renal disease: Secondary | ICD-10-CM | POA: Diagnosis not present

## 2017-05-09 DIAGNOSIS — N2581 Secondary hyperparathyroidism of renal origin: Secondary | ICD-10-CM | POA: Diagnosis not present

## 2017-05-09 DIAGNOSIS — E8809 Other disorders of plasma-protein metabolism, not elsewhere classified: Secondary | ICD-10-CM | POA: Diagnosis not present

## 2017-05-09 DIAGNOSIS — D500 Iron deficiency anemia secondary to blood loss (chronic): Secondary | ICD-10-CM | POA: Diagnosis not present

## 2017-05-11 DIAGNOSIS — E8809 Other disorders of plasma-protein metabolism, not elsewhere classified: Secondary | ICD-10-CM | POA: Diagnosis not present

## 2017-05-11 DIAGNOSIS — N2581 Secondary hyperparathyroidism of renal origin: Secondary | ICD-10-CM | POA: Diagnosis not present

## 2017-05-11 DIAGNOSIS — N186 End stage renal disease: Secondary | ICD-10-CM | POA: Diagnosis not present

## 2017-05-11 DIAGNOSIS — D631 Anemia in chronic kidney disease: Secondary | ICD-10-CM | POA: Diagnosis not present

## 2017-05-11 DIAGNOSIS — D500 Iron deficiency anemia secondary to blood loss (chronic): Secondary | ICD-10-CM | POA: Diagnosis not present

## 2017-05-14 DIAGNOSIS — E8809 Other disorders of plasma-protein metabolism, not elsewhere classified: Secondary | ICD-10-CM | POA: Diagnosis not present

## 2017-05-14 DIAGNOSIS — D500 Iron deficiency anemia secondary to blood loss (chronic): Secondary | ICD-10-CM | POA: Diagnosis not present

## 2017-05-14 DIAGNOSIS — N186 End stage renal disease: Secondary | ICD-10-CM | POA: Diagnosis not present

## 2017-05-14 DIAGNOSIS — N2581 Secondary hyperparathyroidism of renal origin: Secondary | ICD-10-CM | POA: Diagnosis not present

## 2017-05-14 DIAGNOSIS — D631 Anemia in chronic kidney disease: Secondary | ICD-10-CM | POA: Diagnosis not present

## 2017-05-16 DIAGNOSIS — D631 Anemia in chronic kidney disease: Secondary | ICD-10-CM | POA: Diagnosis not present

## 2017-05-16 DIAGNOSIS — N2581 Secondary hyperparathyroidism of renal origin: Secondary | ICD-10-CM | POA: Diagnosis not present

## 2017-05-16 DIAGNOSIS — N186 End stage renal disease: Secondary | ICD-10-CM | POA: Diagnosis not present

## 2017-05-16 DIAGNOSIS — D500 Iron deficiency anemia secondary to blood loss (chronic): Secondary | ICD-10-CM | POA: Diagnosis not present

## 2017-05-16 DIAGNOSIS — E8809 Other disorders of plasma-protein metabolism, not elsewhere classified: Secondary | ICD-10-CM | POA: Diagnosis not present

## 2017-05-18 DIAGNOSIS — N2581 Secondary hyperparathyroidism of renal origin: Secondary | ICD-10-CM | POA: Diagnosis not present

## 2017-05-18 DIAGNOSIS — D500 Iron deficiency anemia secondary to blood loss (chronic): Secondary | ICD-10-CM | POA: Diagnosis not present

## 2017-05-18 DIAGNOSIS — D631 Anemia in chronic kidney disease: Secondary | ICD-10-CM | POA: Diagnosis not present

## 2017-05-18 DIAGNOSIS — N186 End stage renal disease: Secondary | ICD-10-CM | POA: Diagnosis not present

## 2017-05-18 DIAGNOSIS — E8809 Other disorders of plasma-protein metabolism, not elsewhere classified: Secondary | ICD-10-CM | POA: Diagnosis not present

## 2017-05-20 DIAGNOSIS — E1142 Type 2 diabetes mellitus with diabetic polyneuropathy: Secondary | ICD-10-CM | POA: Diagnosis not present

## 2017-05-20 DIAGNOSIS — L851 Acquired keratosis [keratoderma] palmaris et plantaris: Secondary | ICD-10-CM | POA: Diagnosis not present

## 2017-05-20 DIAGNOSIS — B351 Tinea unguium: Secondary | ICD-10-CM | POA: Diagnosis not present

## 2017-05-20 IMAGING — DX DG FOOT COMPLETE 3+V*R*
3 series · 3 of 3 positions shown · non-contrast
Comparison: None.

CLINICAL DATA: Preoperative evaluation prior to fourth digit
removal

EXAM:
RIGHT FOOT COMPLETE - 3+ VIEW

[foot ap]
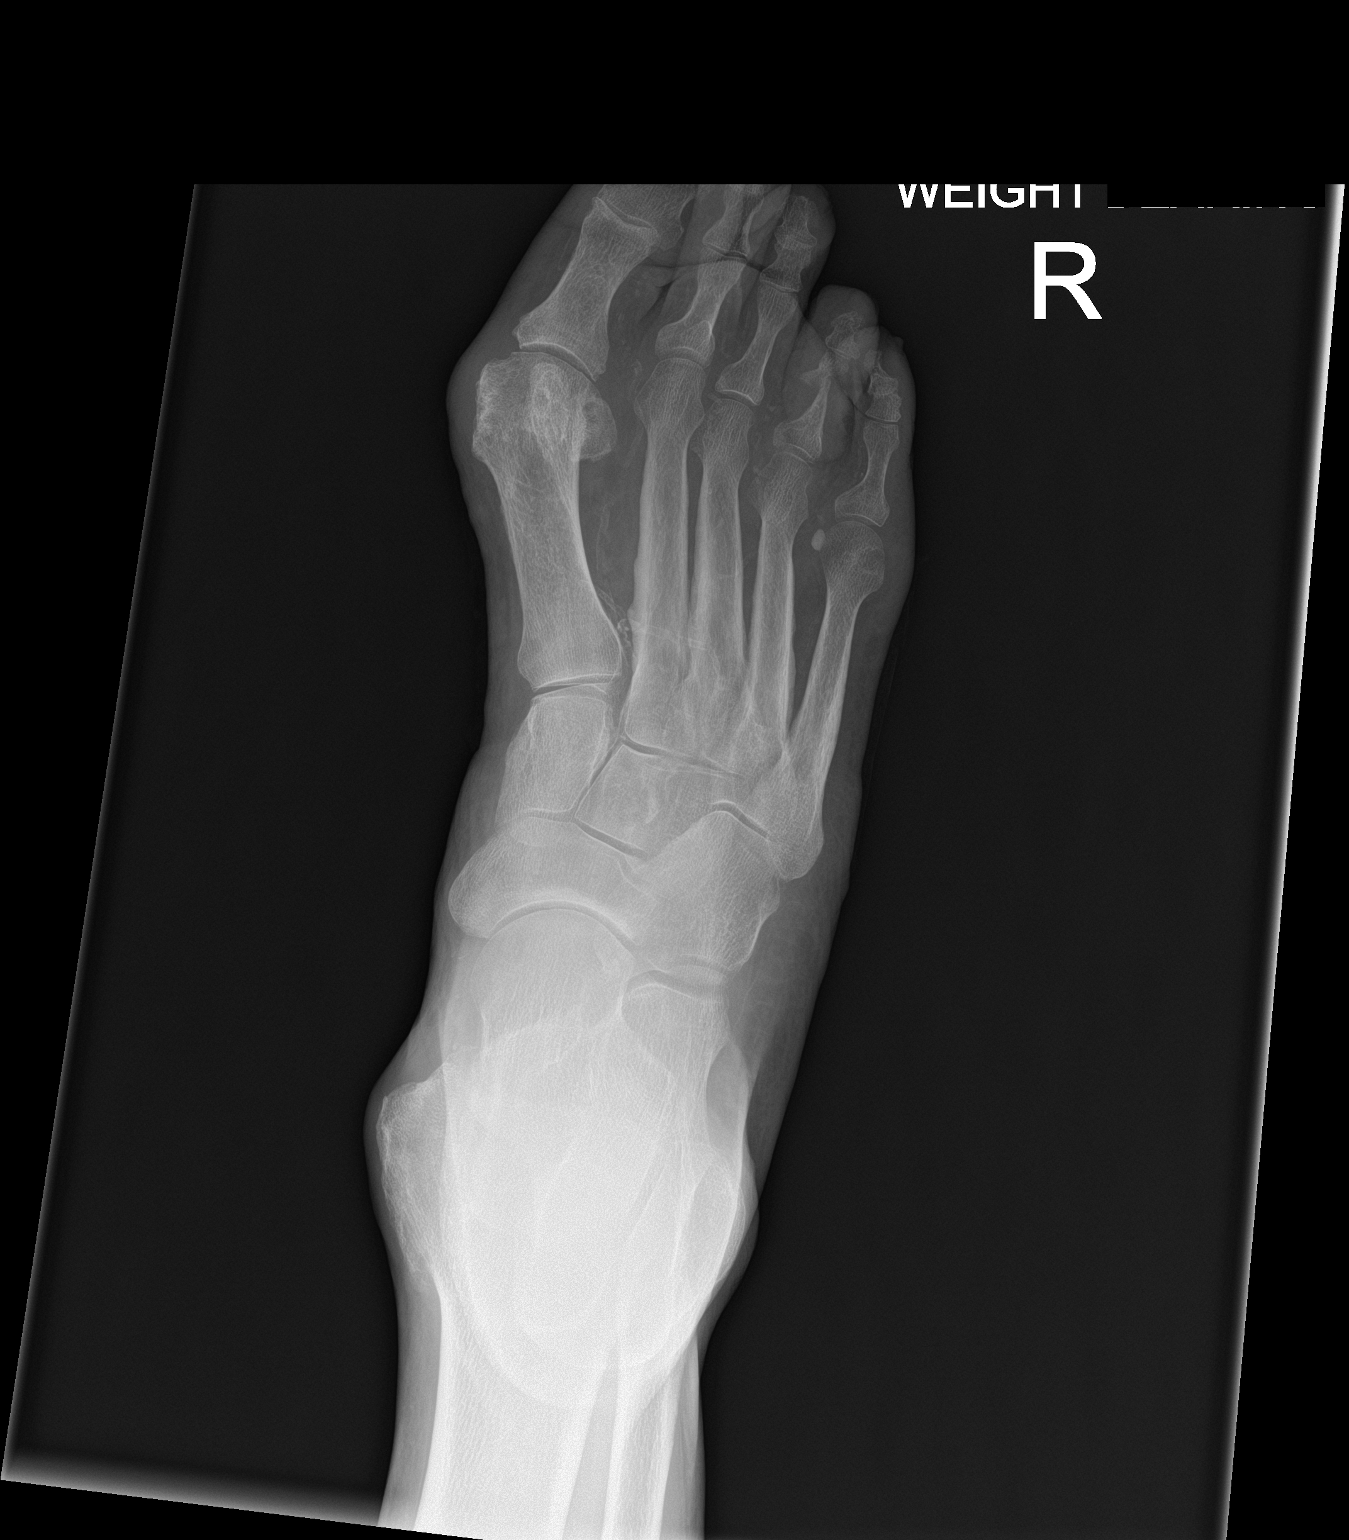

[foot obl]
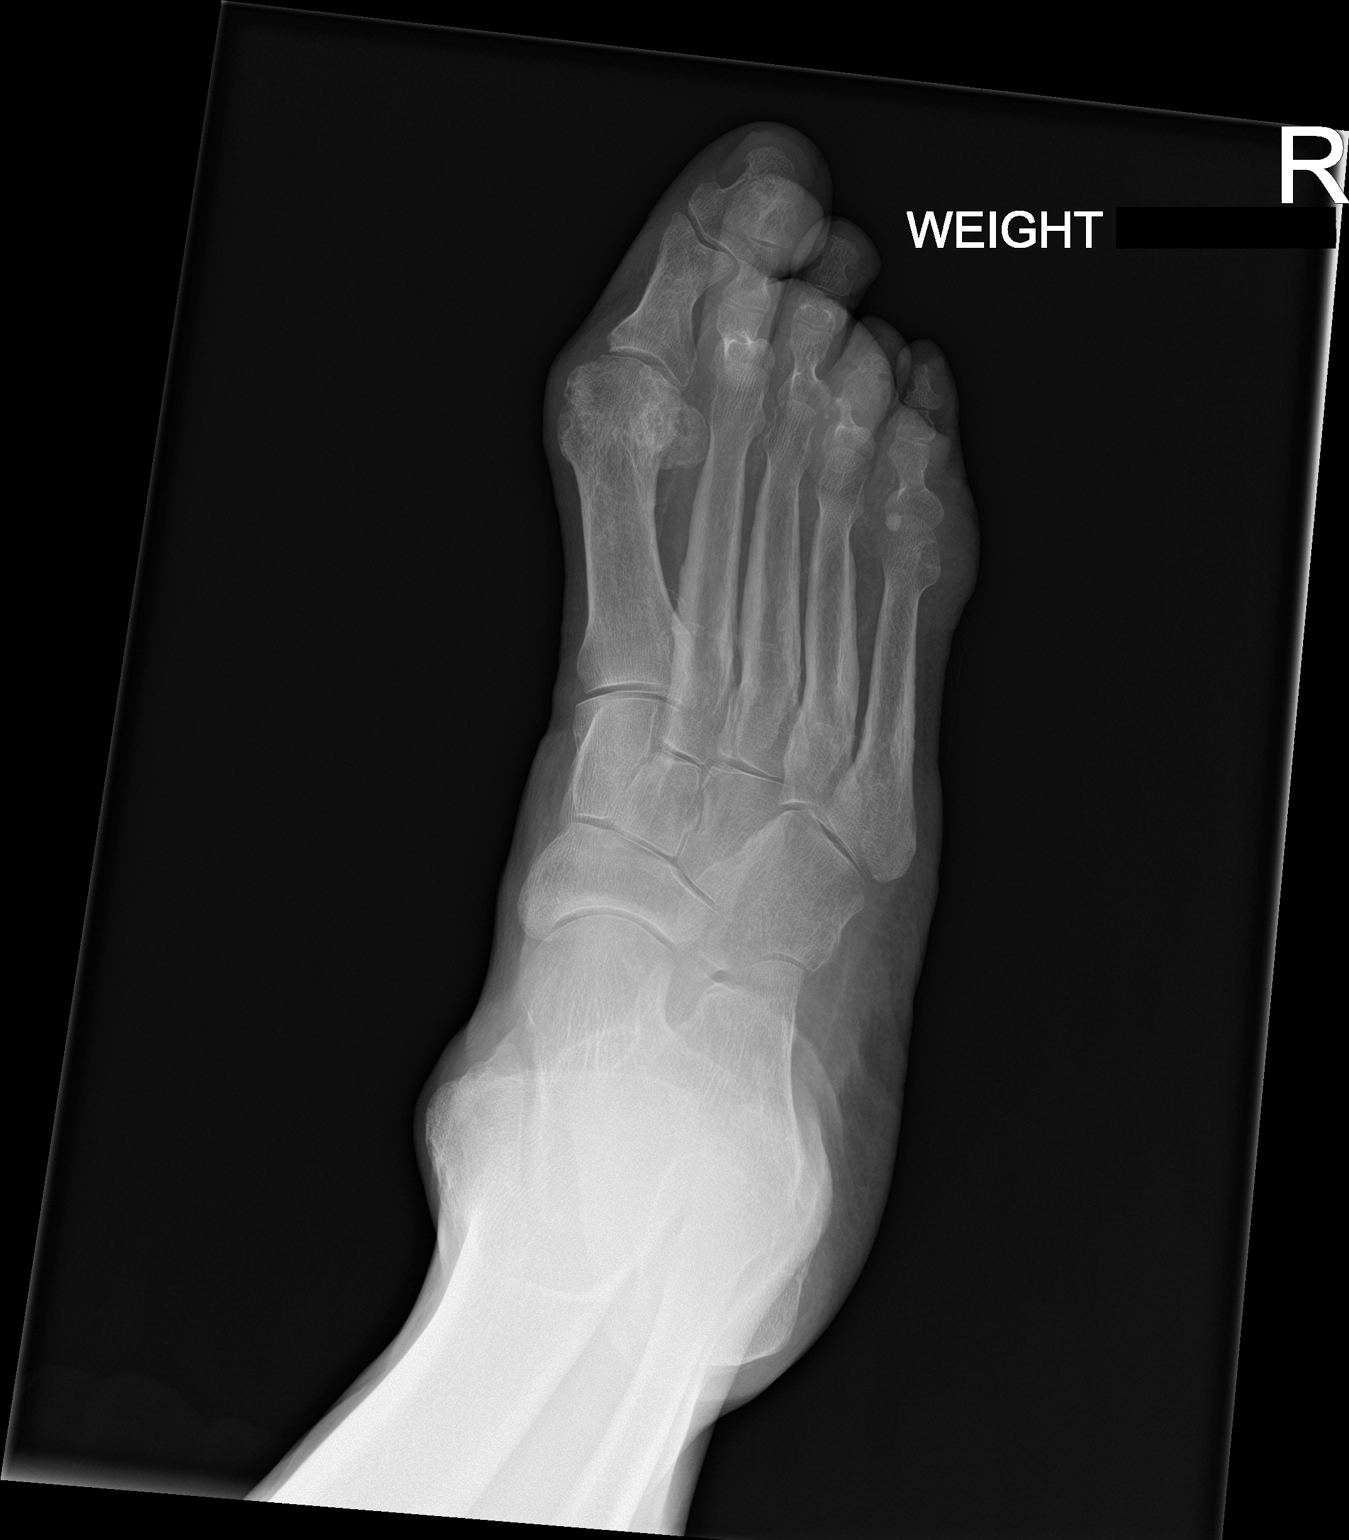

[foot lat standing]
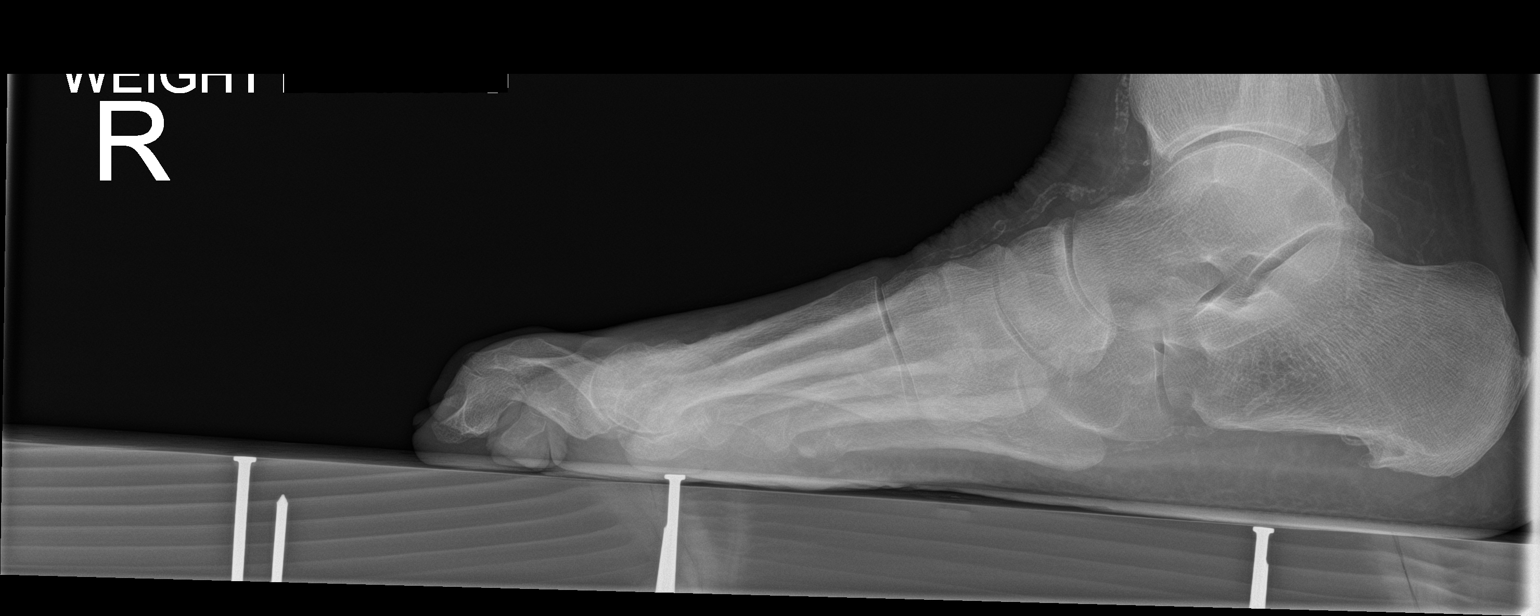

[3 of 3 positions shown; findings below may reference images not displayed]

FINDINGS: There is some remodeling of the fourth proximal and middle
phalanges. Diffuse vascular calcifications are seen. Hallux valgus
deformity is noted. There is some suggestion of bony erosion in the
base of the fourth middle phalanx. Clinical correlation is
recommended.
IMPRESSION: Changes in the fourth digit.  No other acute abnormality is seen.

## 2017-05-21 DIAGNOSIS — E8809 Other disorders of plasma-protein metabolism, not elsewhere classified: Secondary | ICD-10-CM | POA: Diagnosis not present

## 2017-05-21 DIAGNOSIS — N2581 Secondary hyperparathyroidism of renal origin: Secondary | ICD-10-CM | POA: Diagnosis not present

## 2017-05-21 DIAGNOSIS — D631 Anemia in chronic kidney disease: Secondary | ICD-10-CM | POA: Diagnosis not present

## 2017-05-21 DIAGNOSIS — N186 End stage renal disease: Secondary | ICD-10-CM | POA: Diagnosis not present

## 2017-05-21 DIAGNOSIS — D500 Iron deficiency anemia secondary to blood loss (chronic): Secondary | ICD-10-CM | POA: Diagnosis not present

## 2017-05-23 DIAGNOSIS — E8809 Other disorders of plasma-protein metabolism, not elsewhere classified: Secondary | ICD-10-CM | POA: Diagnosis not present

## 2017-05-23 DIAGNOSIS — N2581 Secondary hyperparathyroidism of renal origin: Secondary | ICD-10-CM | POA: Diagnosis not present

## 2017-05-23 DIAGNOSIS — N186 End stage renal disease: Secondary | ICD-10-CM | POA: Diagnosis not present

## 2017-05-23 DIAGNOSIS — D500 Iron deficiency anemia secondary to blood loss (chronic): Secondary | ICD-10-CM | POA: Diagnosis not present

## 2017-05-23 DIAGNOSIS — D631 Anemia in chronic kidney disease: Secondary | ICD-10-CM | POA: Diagnosis not present

## 2017-05-25 DIAGNOSIS — N186 End stage renal disease: Secondary | ICD-10-CM | POA: Diagnosis not present

## 2017-05-25 DIAGNOSIS — D631 Anemia in chronic kidney disease: Secondary | ICD-10-CM | POA: Diagnosis not present

## 2017-05-25 DIAGNOSIS — D500 Iron deficiency anemia secondary to blood loss (chronic): Secondary | ICD-10-CM | POA: Diagnosis not present

## 2017-05-25 DIAGNOSIS — N2581 Secondary hyperparathyroidism of renal origin: Secondary | ICD-10-CM | POA: Diagnosis not present

## 2017-05-25 DIAGNOSIS — E8809 Other disorders of plasma-protein metabolism, not elsewhere classified: Secondary | ICD-10-CM | POA: Diagnosis not present

## 2017-05-28 DIAGNOSIS — N186 End stage renal disease: Secondary | ICD-10-CM | POA: Diagnosis not present

## 2017-05-28 DIAGNOSIS — E8809 Other disorders of plasma-protein metabolism, not elsewhere classified: Secondary | ICD-10-CM | POA: Diagnosis not present

## 2017-05-28 DIAGNOSIS — D631 Anemia in chronic kidney disease: Secondary | ICD-10-CM | POA: Diagnosis not present

## 2017-05-28 DIAGNOSIS — N2581 Secondary hyperparathyroidism of renal origin: Secondary | ICD-10-CM | POA: Diagnosis not present

## 2017-05-28 DIAGNOSIS — D500 Iron deficiency anemia secondary to blood loss (chronic): Secondary | ICD-10-CM | POA: Diagnosis not present

## 2017-05-30 DIAGNOSIS — N2581 Secondary hyperparathyroidism of renal origin: Secondary | ICD-10-CM | POA: Diagnosis not present

## 2017-05-30 DIAGNOSIS — E8809 Other disorders of plasma-protein metabolism, not elsewhere classified: Secondary | ICD-10-CM | POA: Diagnosis not present

## 2017-05-30 DIAGNOSIS — D631 Anemia in chronic kidney disease: Secondary | ICD-10-CM | POA: Diagnosis not present

## 2017-05-30 DIAGNOSIS — D500 Iron deficiency anemia secondary to blood loss (chronic): Secondary | ICD-10-CM | POA: Diagnosis not present

## 2017-05-30 DIAGNOSIS — Z992 Dependence on renal dialysis: Secondary | ICD-10-CM | POA: Diagnosis not present

## 2017-05-30 DIAGNOSIS — N186 End stage renal disease: Secondary | ICD-10-CM | POA: Diagnosis not present

## 2017-06-01 DIAGNOSIS — Z23 Encounter for immunization: Secondary | ICD-10-CM | POA: Diagnosis not present

## 2017-06-01 DIAGNOSIS — D631 Anemia in chronic kidney disease: Secondary | ICD-10-CM | POA: Diagnosis not present

## 2017-06-01 DIAGNOSIS — N186 End stage renal disease: Secondary | ICD-10-CM | POA: Diagnosis not present

## 2017-06-01 DIAGNOSIS — D6959 Other secondary thrombocytopenia: Secondary | ICD-10-CM | POA: Diagnosis not present

## 2017-06-01 DIAGNOSIS — N2581 Secondary hyperparathyroidism of renal origin: Secondary | ICD-10-CM | POA: Diagnosis not present

## 2017-06-01 DIAGNOSIS — D500 Iron deficiency anemia secondary to blood loss (chronic): Secondary | ICD-10-CM | POA: Diagnosis not present

## 2017-06-04 DIAGNOSIS — D6959 Other secondary thrombocytopenia: Secondary | ICD-10-CM | POA: Diagnosis not present

## 2017-06-04 DIAGNOSIS — N186 End stage renal disease: Secondary | ICD-10-CM | POA: Diagnosis not present

## 2017-06-04 DIAGNOSIS — D500 Iron deficiency anemia secondary to blood loss (chronic): Secondary | ICD-10-CM | POA: Diagnosis not present

## 2017-06-04 DIAGNOSIS — N2581 Secondary hyperparathyroidism of renal origin: Secondary | ICD-10-CM | POA: Diagnosis not present

## 2017-06-04 DIAGNOSIS — D631 Anemia in chronic kidney disease: Secondary | ICD-10-CM | POA: Diagnosis not present

## 2017-06-06 DIAGNOSIS — D500 Iron deficiency anemia secondary to blood loss (chronic): Secondary | ICD-10-CM | POA: Diagnosis not present

## 2017-06-06 DIAGNOSIS — D6959 Other secondary thrombocytopenia: Secondary | ICD-10-CM | POA: Diagnosis not present

## 2017-06-06 DIAGNOSIS — D631 Anemia in chronic kidney disease: Secondary | ICD-10-CM | POA: Diagnosis not present

## 2017-06-06 DIAGNOSIS — N186 End stage renal disease: Secondary | ICD-10-CM | POA: Diagnosis not present

## 2017-06-06 DIAGNOSIS — N2581 Secondary hyperparathyroidism of renal origin: Secondary | ICD-10-CM | POA: Diagnosis not present

## 2017-06-08 DIAGNOSIS — D6959 Other secondary thrombocytopenia: Secondary | ICD-10-CM | POA: Diagnosis not present

## 2017-06-08 DIAGNOSIS — N2581 Secondary hyperparathyroidism of renal origin: Secondary | ICD-10-CM | POA: Diagnosis not present

## 2017-06-08 DIAGNOSIS — D631 Anemia in chronic kidney disease: Secondary | ICD-10-CM | POA: Diagnosis not present

## 2017-06-08 DIAGNOSIS — D500 Iron deficiency anemia secondary to blood loss (chronic): Secondary | ICD-10-CM | POA: Diagnosis not present

## 2017-06-08 DIAGNOSIS — N186 End stage renal disease: Secondary | ICD-10-CM | POA: Diagnosis not present

## 2017-06-11 DIAGNOSIS — D500 Iron deficiency anemia secondary to blood loss (chronic): Secondary | ICD-10-CM | POA: Diagnosis not present

## 2017-06-11 DIAGNOSIS — N2581 Secondary hyperparathyroidism of renal origin: Secondary | ICD-10-CM | POA: Diagnosis not present

## 2017-06-11 DIAGNOSIS — D6959 Other secondary thrombocytopenia: Secondary | ICD-10-CM | POA: Diagnosis not present

## 2017-06-11 DIAGNOSIS — D631 Anemia in chronic kidney disease: Secondary | ICD-10-CM | POA: Diagnosis not present

## 2017-06-11 DIAGNOSIS — N186 End stage renal disease: Secondary | ICD-10-CM | POA: Diagnosis not present

## 2017-06-13 DIAGNOSIS — N2581 Secondary hyperparathyroidism of renal origin: Secondary | ICD-10-CM | POA: Diagnosis not present

## 2017-06-13 DIAGNOSIS — E1129 Type 2 diabetes mellitus with other diabetic kidney complication: Secondary | ICD-10-CM | POA: Diagnosis not present

## 2017-06-13 DIAGNOSIS — D6959 Other secondary thrombocytopenia: Secondary | ICD-10-CM | POA: Diagnosis not present

## 2017-06-13 DIAGNOSIS — D631 Anemia in chronic kidney disease: Secondary | ICD-10-CM | POA: Diagnosis not present

## 2017-06-13 DIAGNOSIS — D500 Iron deficiency anemia secondary to blood loss (chronic): Secondary | ICD-10-CM | POA: Diagnosis not present

## 2017-06-13 DIAGNOSIS — N186 End stage renal disease: Secondary | ICD-10-CM | POA: Diagnosis not present

## 2017-06-15 DIAGNOSIS — N186 End stage renal disease: Secondary | ICD-10-CM | POA: Diagnosis not present

## 2017-06-15 DIAGNOSIS — D500 Iron deficiency anemia secondary to blood loss (chronic): Secondary | ICD-10-CM | POA: Diagnosis not present

## 2017-06-15 DIAGNOSIS — N2581 Secondary hyperparathyroidism of renal origin: Secondary | ICD-10-CM | POA: Diagnosis not present

## 2017-06-15 DIAGNOSIS — D631 Anemia in chronic kidney disease: Secondary | ICD-10-CM | POA: Diagnosis not present

## 2017-06-15 DIAGNOSIS — D6959 Other secondary thrombocytopenia: Secondary | ICD-10-CM | POA: Diagnosis not present

## 2017-06-18 DIAGNOSIS — D631 Anemia in chronic kidney disease: Secondary | ICD-10-CM | POA: Diagnosis not present

## 2017-06-18 DIAGNOSIS — D6959 Other secondary thrombocytopenia: Secondary | ICD-10-CM | POA: Diagnosis not present

## 2017-06-18 DIAGNOSIS — D500 Iron deficiency anemia secondary to blood loss (chronic): Secondary | ICD-10-CM | POA: Diagnosis not present

## 2017-06-18 DIAGNOSIS — N186 End stage renal disease: Secondary | ICD-10-CM | POA: Diagnosis not present

## 2017-06-18 DIAGNOSIS — N2581 Secondary hyperparathyroidism of renal origin: Secondary | ICD-10-CM | POA: Diagnosis not present

## 2017-06-20 DIAGNOSIS — D500 Iron deficiency anemia secondary to blood loss (chronic): Secondary | ICD-10-CM | POA: Diagnosis not present

## 2017-06-20 DIAGNOSIS — D6959 Other secondary thrombocytopenia: Secondary | ICD-10-CM | POA: Diagnosis not present

## 2017-06-20 DIAGNOSIS — N186 End stage renal disease: Secondary | ICD-10-CM | POA: Diagnosis not present

## 2017-06-20 DIAGNOSIS — N2581 Secondary hyperparathyroidism of renal origin: Secondary | ICD-10-CM | POA: Diagnosis not present

## 2017-06-20 DIAGNOSIS — D631 Anemia in chronic kidney disease: Secondary | ICD-10-CM | POA: Diagnosis not present

## 2017-06-22 DIAGNOSIS — D631 Anemia in chronic kidney disease: Secondary | ICD-10-CM | POA: Diagnosis not present

## 2017-06-22 DIAGNOSIS — D500 Iron deficiency anemia secondary to blood loss (chronic): Secondary | ICD-10-CM | POA: Diagnosis not present

## 2017-06-22 DIAGNOSIS — N186 End stage renal disease: Secondary | ICD-10-CM | POA: Diagnosis not present

## 2017-06-22 DIAGNOSIS — N2581 Secondary hyperparathyroidism of renal origin: Secondary | ICD-10-CM | POA: Diagnosis not present

## 2017-06-22 DIAGNOSIS — D6959 Other secondary thrombocytopenia: Secondary | ICD-10-CM | POA: Diagnosis not present

## 2017-06-25 DIAGNOSIS — D500 Iron deficiency anemia secondary to blood loss (chronic): Secondary | ICD-10-CM | POA: Diagnosis not present

## 2017-06-25 DIAGNOSIS — N2581 Secondary hyperparathyroidism of renal origin: Secondary | ICD-10-CM | POA: Diagnosis not present

## 2017-06-25 DIAGNOSIS — D631 Anemia in chronic kidney disease: Secondary | ICD-10-CM | POA: Diagnosis not present

## 2017-06-25 DIAGNOSIS — D6959 Other secondary thrombocytopenia: Secondary | ICD-10-CM | POA: Diagnosis not present

## 2017-06-25 DIAGNOSIS — N186 End stage renal disease: Secondary | ICD-10-CM | POA: Diagnosis not present

## 2017-06-27 DIAGNOSIS — N186 End stage renal disease: Secondary | ICD-10-CM | POA: Diagnosis not present

## 2017-06-27 DIAGNOSIS — D6959 Other secondary thrombocytopenia: Secondary | ICD-10-CM | POA: Diagnosis not present

## 2017-06-27 DIAGNOSIS — N2581 Secondary hyperparathyroidism of renal origin: Secondary | ICD-10-CM | POA: Diagnosis not present

## 2017-06-27 DIAGNOSIS — D500 Iron deficiency anemia secondary to blood loss (chronic): Secondary | ICD-10-CM | POA: Diagnosis not present

## 2017-06-27 DIAGNOSIS — D631 Anemia in chronic kidney disease: Secondary | ICD-10-CM | POA: Diagnosis not present

## 2017-06-29 DIAGNOSIS — D6959 Other secondary thrombocytopenia: Secondary | ICD-10-CM | POA: Diagnosis not present

## 2017-06-29 DIAGNOSIS — N2581 Secondary hyperparathyroidism of renal origin: Secondary | ICD-10-CM | POA: Diagnosis not present

## 2017-06-29 DIAGNOSIS — D500 Iron deficiency anemia secondary to blood loss (chronic): Secondary | ICD-10-CM | POA: Diagnosis not present

## 2017-06-29 DIAGNOSIS — N186 End stage renal disease: Secondary | ICD-10-CM | POA: Diagnosis not present

## 2017-06-29 DIAGNOSIS — D631 Anemia in chronic kidney disease: Secondary | ICD-10-CM | POA: Diagnosis not present

## 2017-06-30 DIAGNOSIS — Z992 Dependence on renal dialysis: Secondary | ICD-10-CM | POA: Diagnosis not present

## 2017-06-30 DIAGNOSIS — N186 End stage renal disease: Secondary | ICD-10-CM | POA: Diagnosis not present

## 2017-07-02 DIAGNOSIS — N186 End stage renal disease: Secondary | ICD-10-CM | POA: Diagnosis not present

## 2017-07-02 DIAGNOSIS — D631 Anemia in chronic kidney disease: Secondary | ICD-10-CM | POA: Diagnosis not present

## 2017-07-02 DIAGNOSIS — D500 Iron deficiency anemia secondary to blood loss (chronic): Secondary | ICD-10-CM | POA: Diagnosis not present

## 2017-07-02 DIAGNOSIS — E8809 Other disorders of plasma-protein metabolism, not elsewhere classified: Secondary | ICD-10-CM | POA: Diagnosis not present

## 2017-07-02 DIAGNOSIS — N2581 Secondary hyperparathyroidism of renal origin: Secondary | ICD-10-CM | POA: Diagnosis not present

## 2017-07-04 DIAGNOSIS — N186 End stage renal disease: Secondary | ICD-10-CM | POA: Diagnosis not present

## 2017-07-04 DIAGNOSIS — N2581 Secondary hyperparathyroidism of renal origin: Secondary | ICD-10-CM | POA: Diagnosis not present

## 2017-07-04 DIAGNOSIS — D500 Iron deficiency anemia secondary to blood loss (chronic): Secondary | ICD-10-CM | POA: Diagnosis not present

## 2017-07-04 DIAGNOSIS — E8809 Other disorders of plasma-protein metabolism, not elsewhere classified: Secondary | ICD-10-CM | POA: Diagnosis not present

## 2017-07-04 DIAGNOSIS — D631 Anemia in chronic kidney disease: Secondary | ICD-10-CM | POA: Diagnosis not present

## 2017-07-06 DIAGNOSIS — N2581 Secondary hyperparathyroidism of renal origin: Secondary | ICD-10-CM | POA: Diagnosis not present

## 2017-07-06 DIAGNOSIS — N186 End stage renal disease: Secondary | ICD-10-CM | POA: Diagnosis not present

## 2017-07-06 DIAGNOSIS — D500 Iron deficiency anemia secondary to blood loss (chronic): Secondary | ICD-10-CM | POA: Diagnosis not present

## 2017-07-06 DIAGNOSIS — D631 Anemia in chronic kidney disease: Secondary | ICD-10-CM | POA: Diagnosis not present

## 2017-07-06 DIAGNOSIS — E8809 Other disorders of plasma-protein metabolism, not elsewhere classified: Secondary | ICD-10-CM | POA: Diagnosis not present

## 2017-07-09 DIAGNOSIS — E8809 Other disorders of plasma-protein metabolism, not elsewhere classified: Secondary | ICD-10-CM | POA: Diagnosis not present

## 2017-07-09 DIAGNOSIS — D500 Iron deficiency anemia secondary to blood loss (chronic): Secondary | ICD-10-CM | POA: Diagnosis not present

## 2017-07-09 DIAGNOSIS — N2581 Secondary hyperparathyroidism of renal origin: Secondary | ICD-10-CM | POA: Diagnosis not present

## 2017-07-09 DIAGNOSIS — D631 Anemia in chronic kidney disease: Secondary | ICD-10-CM | POA: Diagnosis not present

## 2017-07-09 DIAGNOSIS — N186 End stage renal disease: Secondary | ICD-10-CM | POA: Diagnosis not present

## 2017-07-11 DIAGNOSIS — N186 End stage renal disease: Secondary | ICD-10-CM | POA: Diagnosis not present

## 2017-07-11 DIAGNOSIS — D500 Iron deficiency anemia secondary to blood loss (chronic): Secondary | ICD-10-CM | POA: Diagnosis not present

## 2017-07-11 DIAGNOSIS — D631 Anemia in chronic kidney disease: Secondary | ICD-10-CM | POA: Diagnosis not present

## 2017-07-11 DIAGNOSIS — E8809 Other disorders of plasma-protein metabolism, not elsewhere classified: Secondary | ICD-10-CM | POA: Diagnosis not present

## 2017-07-11 DIAGNOSIS — N2581 Secondary hyperparathyroidism of renal origin: Secondary | ICD-10-CM | POA: Diagnosis not present

## 2017-07-13 DIAGNOSIS — D631 Anemia in chronic kidney disease: Secondary | ICD-10-CM | POA: Diagnosis not present

## 2017-07-13 DIAGNOSIS — D500 Iron deficiency anemia secondary to blood loss (chronic): Secondary | ICD-10-CM | POA: Diagnosis not present

## 2017-07-13 DIAGNOSIS — N2581 Secondary hyperparathyroidism of renal origin: Secondary | ICD-10-CM | POA: Diagnosis not present

## 2017-07-13 DIAGNOSIS — N186 End stage renal disease: Secondary | ICD-10-CM | POA: Diagnosis not present

## 2017-07-13 DIAGNOSIS — E8809 Other disorders of plasma-protein metabolism, not elsewhere classified: Secondary | ICD-10-CM | POA: Diagnosis not present

## 2017-07-16 DIAGNOSIS — E8809 Other disorders of plasma-protein metabolism, not elsewhere classified: Secondary | ICD-10-CM | POA: Diagnosis not present

## 2017-07-16 DIAGNOSIS — N186 End stage renal disease: Secondary | ICD-10-CM | POA: Diagnosis not present

## 2017-07-16 DIAGNOSIS — D500 Iron deficiency anemia secondary to blood loss (chronic): Secondary | ICD-10-CM | POA: Diagnosis not present

## 2017-07-16 DIAGNOSIS — D631 Anemia in chronic kidney disease: Secondary | ICD-10-CM | POA: Diagnosis not present

## 2017-07-16 DIAGNOSIS — N2581 Secondary hyperparathyroidism of renal origin: Secondary | ICD-10-CM | POA: Diagnosis not present

## 2017-07-18 DIAGNOSIS — N2581 Secondary hyperparathyroidism of renal origin: Secondary | ICD-10-CM | POA: Diagnosis not present

## 2017-07-18 DIAGNOSIS — N186 End stage renal disease: Secondary | ICD-10-CM | POA: Diagnosis not present

## 2017-07-18 DIAGNOSIS — D500 Iron deficiency anemia secondary to blood loss (chronic): Secondary | ICD-10-CM | POA: Diagnosis not present

## 2017-07-18 DIAGNOSIS — D631 Anemia in chronic kidney disease: Secondary | ICD-10-CM | POA: Diagnosis not present

## 2017-07-18 DIAGNOSIS — E8809 Other disorders of plasma-protein metabolism, not elsewhere classified: Secondary | ICD-10-CM | POA: Diagnosis not present

## 2017-07-20 DIAGNOSIS — N186 End stage renal disease: Secondary | ICD-10-CM | POA: Diagnosis not present

## 2017-07-20 DIAGNOSIS — D500 Iron deficiency anemia secondary to blood loss (chronic): Secondary | ICD-10-CM | POA: Diagnosis not present

## 2017-07-20 DIAGNOSIS — N2581 Secondary hyperparathyroidism of renal origin: Secondary | ICD-10-CM | POA: Diagnosis not present

## 2017-07-20 DIAGNOSIS — E8809 Other disorders of plasma-protein metabolism, not elsewhere classified: Secondary | ICD-10-CM | POA: Diagnosis not present

## 2017-07-20 DIAGNOSIS — D631 Anemia in chronic kidney disease: Secondary | ICD-10-CM | POA: Diagnosis not present

## 2017-07-23 DIAGNOSIS — N2581 Secondary hyperparathyroidism of renal origin: Secondary | ICD-10-CM | POA: Diagnosis not present

## 2017-07-23 DIAGNOSIS — D500 Iron deficiency anemia secondary to blood loss (chronic): Secondary | ICD-10-CM | POA: Diagnosis not present

## 2017-07-23 DIAGNOSIS — D631 Anemia in chronic kidney disease: Secondary | ICD-10-CM | POA: Diagnosis not present

## 2017-07-23 DIAGNOSIS — N186 End stage renal disease: Secondary | ICD-10-CM | POA: Diagnosis not present

## 2017-07-23 DIAGNOSIS — E8809 Other disorders of plasma-protein metabolism, not elsewhere classified: Secondary | ICD-10-CM | POA: Diagnosis not present

## 2017-07-25 DIAGNOSIS — E8809 Other disorders of plasma-protein metabolism, not elsewhere classified: Secondary | ICD-10-CM | POA: Diagnosis not present

## 2017-07-25 DIAGNOSIS — D500 Iron deficiency anemia secondary to blood loss (chronic): Secondary | ICD-10-CM | POA: Diagnosis not present

## 2017-07-25 DIAGNOSIS — D631 Anemia in chronic kidney disease: Secondary | ICD-10-CM | POA: Diagnosis not present

## 2017-07-25 DIAGNOSIS — N2581 Secondary hyperparathyroidism of renal origin: Secondary | ICD-10-CM | POA: Diagnosis not present

## 2017-07-25 DIAGNOSIS — N186 End stage renal disease: Secondary | ICD-10-CM | POA: Diagnosis not present

## 2017-07-29 DIAGNOSIS — B351 Tinea unguium: Secondary | ICD-10-CM | POA: Diagnosis not present

## 2017-07-29 DIAGNOSIS — L851 Acquired keratosis [keratoderma] palmaris et plantaris: Secondary | ICD-10-CM | POA: Diagnosis not present

## 2017-07-29 DIAGNOSIS — E1142 Type 2 diabetes mellitus with diabetic polyneuropathy: Secondary | ICD-10-CM | POA: Diagnosis not present

## 2017-07-30 DIAGNOSIS — D631 Anemia in chronic kidney disease: Secondary | ICD-10-CM | POA: Diagnosis not present

## 2017-07-30 DIAGNOSIS — E8809 Other disorders of plasma-protein metabolism, not elsewhere classified: Secondary | ICD-10-CM | POA: Diagnosis not present

## 2017-07-30 DIAGNOSIS — N186 End stage renal disease: Secondary | ICD-10-CM | POA: Diagnosis not present

## 2017-07-30 DIAGNOSIS — D500 Iron deficiency anemia secondary to blood loss (chronic): Secondary | ICD-10-CM | POA: Diagnosis not present

## 2017-07-30 DIAGNOSIS — N2581 Secondary hyperparathyroidism of renal origin: Secondary | ICD-10-CM | POA: Diagnosis not present

## 2017-07-30 DIAGNOSIS — Z992 Dependence on renal dialysis: Secondary | ICD-10-CM | POA: Diagnosis not present

## 2017-08-01 DIAGNOSIS — N2581 Secondary hyperparathyroidism of renal origin: Secondary | ICD-10-CM | POA: Diagnosis not present

## 2017-08-01 DIAGNOSIS — D631 Anemia in chronic kidney disease: Secondary | ICD-10-CM | POA: Diagnosis not present

## 2017-08-01 DIAGNOSIS — E8809 Other disorders of plasma-protein metabolism, not elsewhere classified: Secondary | ICD-10-CM | POA: Diagnosis not present

## 2017-08-01 DIAGNOSIS — N186 End stage renal disease: Secondary | ICD-10-CM | POA: Diagnosis not present

## 2017-08-01 DIAGNOSIS — D500 Iron deficiency anemia secondary to blood loss (chronic): Secondary | ICD-10-CM | POA: Diagnosis not present

## 2017-08-01 DIAGNOSIS — Z23 Encounter for immunization: Secondary | ICD-10-CM | POA: Diagnosis not present

## 2017-08-03 DIAGNOSIS — N186 End stage renal disease: Secondary | ICD-10-CM | POA: Diagnosis not present

## 2017-08-03 DIAGNOSIS — E8809 Other disorders of plasma-protein metabolism, not elsewhere classified: Secondary | ICD-10-CM | POA: Diagnosis not present

## 2017-08-03 DIAGNOSIS — D500 Iron deficiency anemia secondary to blood loss (chronic): Secondary | ICD-10-CM | POA: Diagnosis not present

## 2017-08-03 DIAGNOSIS — D631 Anemia in chronic kidney disease: Secondary | ICD-10-CM | POA: Diagnosis not present

## 2017-08-03 DIAGNOSIS — N2581 Secondary hyperparathyroidism of renal origin: Secondary | ICD-10-CM | POA: Diagnosis not present

## 2017-08-06 DIAGNOSIS — D631 Anemia in chronic kidney disease: Secondary | ICD-10-CM | POA: Diagnosis not present

## 2017-08-06 DIAGNOSIS — E8809 Other disorders of plasma-protein metabolism, not elsewhere classified: Secondary | ICD-10-CM | POA: Diagnosis not present

## 2017-08-06 DIAGNOSIS — N186 End stage renal disease: Secondary | ICD-10-CM | POA: Diagnosis not present

## 2017-08-06 DIAGNOSIS — N2581 Secondary hyperparathyroidism of renal origin: Secondary | ICD-10-CM | POA: Diagnosis not present

## 2017-08-06 DIAGNOSIS — D500 Iron deficiency anemia secondary to blood loss (chronic): Secondary | ICD-10-CM | POA: Diagnosis not present

## 2017-08-08 DIAGNOSIS — D500 Iron deficiency anemia secondary to blood loss (chronic): Secondary | ICD-10-CM | POA: Diagnosis not present

## 2017-08-08 DIAGNOSIS — E8809 Other disorders of plasma-protein metabolism, not elsewhere classified: Secondary | ICD-10-CM | POA: Diagnosis not present

## 2017-08-08 DIAGNOSIS — N2581 Secondary hyperparathyroidism of renal origin: Secondary | ICD-10-CM | POA: Diagnosis not present

## 2017-08-08 DIAGNOSIS — N186 End stage renal disease: Secondary | ICD-10-CM | POA: Diagnosis not present

## 2017-08-08 DIAGNOSIS — D631 Anemia in chronic kidney disease: Secondary | ICD-10-CM | POA: Diagnosis not present

## 2017-08-10 DIAGNOSIS — N2581 Secondary hyperparathyroidism of renal origin: Secondary | ICD-10-CM | POA: Diagnosis not present

## 2017-08-10 DIAGNOSIS — D631 Anemia in chronic kidney disease: Secondary | ICD-10-CM | POA: Diagnosis not present

## 2017-08-10 DIAGNOSIS — N186 End stage renal disease: Secondary | ICD-10-CM | POA: Diagnosis not present

## 2017-08-10 DIAGNOSIS — D500 Iron deficiency anemia secondary to blood loss (chronic): Secondary | ICD-10-CM | POA: Diagnosis not present

## 2017-08-10 DIAGNOSIS — E8809 Other disorders of plasma-protein metabolism, not elsewhere classified: Secondary | ICD-10-CM | POA: Diagnosis not present

## 2017-08-13 DIAGNOSIS — N2581 Secondary hyperparathyroidism of renal origin: Secondary | ICD-10-CM | POA: Diagnosis not present

## 2017-08-13 DIAGNOSIS — D500 Iron deficiency anemia secondary to blood loss (chronic): Secondary | ICD-10-CM | POA: Diagnosis not present

## 2017-08-13 DIAGNOSIS — N186 End stage renal disease: Secondary | ICD-10-CM | POA: Diagnosis not present

## 2017-08-13 DIAGNOSIS — E8809 Other disorders of plasma-protein metabolism, not elsewhere classified: Secondary | ICD-10-CM | POA: Diagnosis not present

## 2017-08-13 DIAGNOSIS — D631 Anemia in chronic kidney disease: Secondary | ICD-10-CM | POA: Diagnosis not present

## 2017-08-15 DIAGNOSIS — N2581 Secondary hyperparathyroidism of renal origin: Secondary | ICD-10-CM | POA: Diagnosis not present

## 2017-08-15 DIAGNOSIS — D631 Anemia in chronic kidney disease: Secondary | ICD-10-CM | POA: Diagnosis not present

## 2017-08-15 DIAGNOSIS — D500 Iron deficiency anemia secondary to blood loss (chronic): Secondary | ICD-10-CM | POA: Diagnosis not present

## 2017-08-15 DIAGNOSIS — N186 End stage renal disease: Secondary | ICD-10-CM | POA: Diagnosis not present

## 2017-08-15 DIAGNOSIS — E8809 Other disorders of plasma-protein metabolism, not elsewhere classified: Secondary | ICD-10-CM | POA: Diagnosis not present

## 2017-08-17 DIAGNOSIS — D500 Iron deficiency anemia secondary to blood loss (chronic): Secondary | ICD-10-CM | POA: Diagnosis not present

## 2017-08-17 DIAGNOSIS — N2581 Secondary hyperparathyroidism of renal origin: Secondary | ICD-10-CM | POA: Diagnosis not present

## 2017-08-17 DIAGNOSIS — E8809 Other disorders of plasma-protein metabolism, not elsewhere classified: Secondary | ICD-10-CM | POA: Diagnosis not present

## 2017-08-17 DIAGNOSIS — D631 Anemia in chronic kidney disease: Secondary | ICD-10-CM | POA: Diagnosis not present

## 2017-08-17 DIAGNOSIS — N186 End stage renal disease: Secondary | ICD-10-CM | POA: Diagnosis not present

## 2017-08-20 DIAGNOSIS — N186 End stage renal disease: Secondary | ICD-10-CM | POA: Diagnosis not present

## 2017-08-20 DIAGNOSIS — E8809 Other disorders of plasma-protein metabolism, not elsewhere classified: Secondary | ICD-10-CM | POA: Diagnosis not present

## 2017-08-20 DIAGNOSIS — N2581 Secondary hyperparathyroidism of renal origin: Secondary | ICD-10-CM | POA: Diagnosis not present

## 2017-08-20 DIAGNOSIS — D500 Iron deficiency anemia secondary to blood loss (chronic): Secondary | ICD-10-CM | POA: Diagnosis not present

## 2017-08-20 DIAGNOSIS — D631 Anemia in chronic kidney disease: Secondary | ICD-10-CM | POA: Diagnosis not present

## 2017-08-22 DIAGNOSIS — E8809 Other disorders of plasma-protein metabolism, not elsewhere classified: Secondary | ICD-10-CM | POA: Diagnosis not present

## 2017-08-22 DIAGNOSIS — D631 Anemia in chronic kidney disease: Secondary | ICD-10-CM | POA: Diagnosis not present

## 2017-08-22 DIAGNOSIS — N186 End stage renal disease: Secondary | ICD-10-CM | POA: Diagnosis not present

## 2017-08-22 DIAGNOSIS — N2581 Secondary hyperparathyroidism of renal origin: Secondary | ICD-10-CM | POA: Diagnosis not present

## 2017-08-22 DIAGNOSIS — D500 Iron deficiency anemia secondary to blood loss (chronic): Secondary | ICD-10-CM | POA: Diagnosis not present

## 2017-08-24 DIAGNOSIS — N186 End stage renal disease: Secondary | ICD-10-CM | POA: Diagnosis not present

## 2017-08-24 DIAGNOSIS — N2581 Secondary hyperparathyroidism of renal origin: Secondary | ICD-10-CM | POA: Diagnosis not present

## 2017-08-24 DIAGNOSIS — D500 Iron deficiency anemia secondary to blood loss (chronic): Secondary | ICD-10-CM | POA: Diagnosis not present

## 2017-08-24 DIAGNOSIS — D631 Anemia in chronic kidney disease: Secondary | ICD-10-CM | POA: Diagnosis not present

## 2017-08-24 DIAGNOSIS — E8809 Other disorders of plasma-protein metabolism, not elsewhere classified: Secondary | ICD-10-CM | POA: Diagnosis not present

## 2017-08-27 DIAGNOSIS — N186 End stage renal disease: Secondary | ICD-10-CM | POA: Diagnosis not present

## 2017-08-27 DIAGNOSIS — E8809 Other disorders of plasma-protein metabolism, not elsewhere classified: Secondary | ICD-10-CM | POA: Diagnosis not present

## 2017-08-27 DIAGNOSIS — D500 Iron deficiency anemia secondary to blood loss (chronic): Secondary | ICD-10-CM | POA: Diagnosis not present

## 2017-08-27 DIAGNOSIS — N2581 Secondary hyperparathyroidism of renal origin: Secondary | ICD-10-CM | POA: Diagnosis not present

## 2017-08-27 DIAGNOSIS — D631 Anemia in chronic kidney disease: Secondary | ICD-10-CM | POA: Diagnosis not present

## 2017-08-29 DIAGNOSIS — D500 Iron deficiency anemia secondary to blood loss (chronic): Secondary | ICD-10-CM | POA: Diagnosis not present

## 2017-08-29 DIAGNOSIS — E8809 Other disorders of plasma-protein metabolism, not elsewhere classified: Secondary | ICD-10-CM | POA: Diagnosis not present

## 2017-08-29 DIAGNOSIS — N2581 Secondary hyperparathyroidism of renal origin: Secondary | ICD-10-CM | POA: Diagnosis not present

## 2017-08-29 DIAGNOSIS — N186 End stage renal disease: Secondary | ICD-10-CM | POA: Diagnosis not present

## 2017-08-29 DIAGNOSIS — D631 Anemia in chronic kidney disease: Secondary | ICD-10-CM | POA: Diagnosis not present

## 2017-08-30 DIAGNOSIS — Z992 Dependence on renal dialysis: Secondary | ICD-10-CM | POA: Diagnosis not present

## 2017-08-30 DIAGNOSIS — N186 End stage renal disease: Secondary | ICD-10-CM | POA: Diagnosis not present

## 2017-08-31 DIAGNOSIS — D631 Anemia in chronic kidney disease: Secondary | ICD-10-CM | POA: Diagnosis not present

## 2017-08-31 DIAGNOSIS — D500 Iron deficiency anemia secondary to blood loss (chronic): Secondary | ICD-10-CM | POA: Diagnosis not present

## 2017-08-31 DIAGNOSIS — N2581 Secondary hyperparathyroidism of renal origin: Secondary | ICD-10-CM | POA: Diagnosis not present

## 2017-08-31 DIAGNOSIS — D6959 Other secondary thrombocytopenia: Secondary | ICD-10-CM | POA: Diagnosis not present

## 2017-08-31 DIAGNOSIS — N186 End stage renal disease: Secondary | ICD-10-CM | POA: Diagnosis not present

## 2017-09-03 DIAGNOSIS — D6959 Other secondary thrombocytopenia: Secondary | ICD-10-CM | POA: Diagnosis not present

## 2017-09-03 DIAGNOSIS — N186 End stage renal disease: Secondary | ICD-10-CM | POA: Diagnosis not present

## 2017-09-03 DIAGNOSIS — D631 Anemia in chronic kidney disease: Secondary | ICD-10-CM | POA: Diagnosis not present

## 2017-09-03 DIAGNOSIS — D500 Iron deficiency anemia secondary to blood loss (chronic): Secondary | ICD-10-CM | POA: Diagnosis not present

## 2017-09-03 DIAGNOSIS — N2581 Secondary hyperparathyroidism of renal origin: Secondary | ICD-10-CM | POA: Diagnosis not present

## 2017-09-05 DIAGNOSIS — D631 Anemia in chronic kidney disease: Secondary | ICD-10-CM | POA: Diagnosis not present

## 2017-09-05 DIAGNOSIS — D6959 Other secondary thrombocytopenia: Secondary | ICD-10-CM | POA: Diagnosis not present

## 2017-09-05 DIAGNOSIS — N186 End stage renal disease: Secondary | ICD-10-CM | POA: Diagnosis not present

## 2017-09-05 DIAGNOSIS — N2581 Secondary hyperparathyroidism of renal origin: Secondary | ICD-10-CM | POA: Diagnosis not present

## 2017-09-05 DIAGNOSIS — D500 Iron deficiency anemia secondary to blood loss (chronic): Secondary | ICD-10-CM | POA: Diagnosis not present

## 2017-09-07 DIAGNOSIS — D500 Iron deficiency anemia secondary to blood loss (chronic): Secondary | ICD-10-CM | POA: Diagnosis not present

## 2017-09-07 DIAGNOSIS — N186 End stage renal disease: Secondary | ICD-10-CM | POA: Diagnosis not present

## 2017-09-07 DIAGNOSIS — N2581 Secondary hyperparathyroidism of renal origin: Secondary | ICD-10-CM | POA: Diagnosis not present

## 2017-09-07 DIAGNOSIS — D631 Anemia in chronic kidney disease: Secondary | ICD-10-CM | POA: Diagnosis not present

## 2017-09-07 DIAGNOSIS — D6959 Other secondary thrombocytopenia: Secondary | ICD-10-CM | POA: Diagnosis not present

## 2017-09-10 DIAGNOSIS — D6959 Other secondary thrombocytopenia: Secondary | ICD-10-CM | POA: Diagnosis not present

## 2017-09-10 DIAGNOSIS — N186 End stage renal disease: Secondary | ICD-10-CM | POA: Diagnosis not present

## 2017-09-10 DIAGNOSIS — N2581 Secondary hyperparathyroidism of renal origin: Secondary | ICD-10-CM | POA: Diagnosis not present

## 2017-09-10 DIAGNOSIS — D500 Iron deficiency anemia secondary to blood loss (chronic): Secondary | ICD-10-CM | POA: Diagnosis not present

## 2017-09-10 DIAGNOSIS — D631 Anemia in chronic kidney disease: Secondary | ICD-10-CM | POA: Diagnosis not present

## 2017-09-12 DIAGNOSIS — N186 End stage renal disease: Secondary | ICD-10-CM | POA: Diagnosis not present

## 2017-09-12 DIAGNOSIS — E1129 Type 2 diabetes mellitus with other diabetic kidney complication: Secondary | ICD-10-CM | POA: Diagnosis not present

## 2017-09-12 DIAGNOSIS — D6959 Other secondary thrombocytopenia: Secondary | ICD-10-CM | POA: Diagnosis not present

## 2017-09-12 DIAGNOSIS — D631 Anemia in chronic kidney disease: Secondary | ICD-10-CM | POA: Diagnosis not present

## 2017-09-12 DIAGNOSIS — N2581 Secondary hyperparathyroidism of renal origin: Secondary | ICD-10-CM | POA: Diagnosis not present

## 2017-09-12 DIAGNOSIS — D500 Iron deficiency anemia secondary to blood loss (chronic): Secondary | ICD-10-CM | POA: Diagnosis not present

## 2017-09-14 DIAGNOSIS — D6959 Other secondary thrombocytopenia: Secondary | ICD-10-CM | POA: Diagnosis not present

## 2017-09-14 DIAGNOSIS — N186 End stage renal disease: Secondary | ICD-10-CM | POA: Diagnosis not present

## 2017-09-14 DIAGNOSIS — N2581 Secondary hyperparathyroidism of renal origin: Secondary | ICD-10-CM | POA: Diagnosis not present

## 2017-09-14 DIAGNOSIS — D631 Anemia in chronic kidney disease: Secondary | ICD-10-CM | POA: Diagnosis not present

## 2017-09-14 DIAGNOSIS — D500 Iron deficiency anemia secondary to blood loss (chronic): Secondary | ICD-10-CM | POA: Diagnosis not present

## 2017-09-17 DIAGNOSIS — D500 Iron deficiency anemia secondary to blood loss (chronic): Secondary | ICD-10-CM | POA: Diagnosis not present

## 2017-09-17 DIAGNOSIS — N2581 Secondary hyperparathyroidism of renal origin: Secondary | ICD-10-CM | POA: Diagnosis not present

## 2017-09-17 DIAGNOSIS — D631 Anemia in chronic kidney disease: Secondary | ICD-10-CM | POA: Diagnosis not present

## 2017-09-17 DIAGNOSIS — D6959 Other secondary thrombocytopenia: Secondary | ICD-10-CM | POA: Diagnosis not present

## 2017-09-17 DIAGNOSIS — N186 End stage renal disease: Secondary | ICD-10-CM | POA: Diagnosis not present

## 2017-09-19 DIAGNOSIS — N186 End stage renal disease: Secondary | ICD-10-CM | POA: Diagnosis not present

## 2017-09-19 DIAGNOSIS — D631 Anemia in chronic kidney disease: Secondary | ICD-10-CM | POA: Diagnosis not present

## 2017-09-19 DIAGNOSIS — D500 Iron deficiency anemia secondary to blood loss (chronic): Secondary | ICD-10-CM | POA: Diagnosis not present

## 2017-09-19 DIAGNOSIS — N2581 Secondary hyperparathyroidism of renal origin: Secondary | ICD-10-CM | POA: Diagnosis not present

## 2017-09-19 DIAGNOSIS — D6959 Other secondary thrombocytopenia: Secondary | ICD-10-CM | POA: Diagnosis not present

## 2017-09-21 DIAGNOSIS — D631 Anemia in chronic kidney disease: Secondary | ICD-10-CM | POA: Diagnosis not present

## 2017-09-21 DIAGNOSIS — D500 Iron deficiency anemia secondary to blood loss (chronic): Secondary | ICD-10-CM | POA: Diagnosis not present

## 2017-09-21 DIAGNOSIS — N186 End stage renal disease: Secondary | ICD-10-CM | POA: Diagnosis not present

## 2017-09-21 DIAGNOSIS — D6959 Other secondary thrombocytopenia: Secondary | ICD-10-CM | POA: Diagnosis not present

## 2017-09-21 DIAGNOSIS — N2581 Secondary hyperparathyroidism of renal origin: Secondary | ICD-10-CM | POA: Diagnosis not present

## 2017-09-24 DIAGNOSIS — N2581 Secondary hyperparathyroidism of renal origin: Secondary | ICD-10-CM | POA: Diagnosis not present

## 2017-09-24 DIAGNOSIS — D6959 Other secondary thrombocytopenia: Secondary | ICD-10-CM | POA: Diagnosis not present

## 2017-09-24 DIAGNOSIS — D500 Iron deficiency anemia secondary to blood loss (chronic): Secondary | ICD-10-CM | POA: Diagnosis not present

## 2017-09-24 DIAGNOSIS — N186 End stage renal disease: Secondary | ICD-10-CM | POA: Diagnosis not present

## 2017-09-24 DIAGNOSIS — D631 Anemia in chronic kidney disease: Secondary | ICD-10-CM | POA: Diagnosis not present

## 2017-09-26 DIAGNOSIS — D6959 Other secondary thrombocytopenia: Secondary | ICD-10-CM | POA: Diagnosis not present

## 2017-09-26 DIAGNOSIS — N186 End stage renal disease: Secondary | ICD-10-CM | POA: Diagnosis not present

## 2017-09-26 DIAGNOSIS — N2581 Secondary hyperparathyroidism of renal origin: Secondary | ICD-10-CM | POA: Diagnosis not present

## 2017-09-26 DIAGNOSIS — D500 Iron deficiency anemia secondary to blood loss (chronic): Secondary | ICD-10-CM | POA: Diagnosis not present

## 2017-09-26 DIAGNOSIS — D631 Anemia in chronic kidney disease: Secondary | ICD-10-CM | POA: Diagnosis not present

## 2017-09-28 DIAGNOSIS — N186 End stage renal disease: Secondary | ICD-10-CM | POA: Diagnosis not present

## 2017-09-28 DIAGNOSIS — D6959 Other secondary thrombocytopenia: Secondary | ICD-10-CM | POA: Diagnosis not present

## 2017-09-28 DIAGNOSIS — D631 Anemia in chronic kidney disease: Secondary | ICD-10-CM | POA: Diagnosis not present

## 2017-09-28 DIAGNOSIS — N2581 Secondary hyperparathyroidism of renal origin: Secondary | ICD-10-CM | POA: Diagnosis not present

## 2017-09-28 DIAGNOSIS — D500 Iron deficiency anemia secondary to blood loss (chronic): Secondary | ICD-10-CM | POA: Diagnosis not present

## 2017-09-29 DIAGNOSIS — N186 End stage renal disease: Secondary | ICD-10-CM | POA: Diagnosis not present

## 2017-09-29 DIAGNOSIS — Z992 Dependence on renal dialysis: Secondary | ICD-10-CM | POA: Diagnosis not present

## 2017-10-01 DIAGNOSIS — E8809 Other disorders of plasma-protein metabolism, not elsewhere classified: Secondary | ICD-10-CM | POA: Diagnosis not present

## 2017-10-01 DIAGNOSIS — N2581 Secondary hyperparathyroidism of renal origin: Secondary | ICD-10-CM | POA: Diagnosis not present

## 2017-10-01 DIAGNOSIS — N186 End stage renal disease: Secondary | ICD-10-CM | POA: Diagnosis not present

## 2017-10-01 DIAGNOSIS — D631 Anemia in chronic kidney disease: Secondary | ICD-10-CM | POA: Diagnosis not present

## 2017-10-01 DIAGNOSIS — D500 Iron deficiency anemia secondary to blood loss (chronic): Secondary | ICD-10-CM | POA: Diagnosis not present

## 2017-10-03 DIAGNOSIS — N2581 Secondary hyperparathyroidism of renal origin: Secondary | ICD-10-CM | POA: Diagnosis not present

## 2017-10-03 DIAGNOSIS — E8809 Other disorders of plasma-protein metabolism, not elsewhere classified: Secondary | ICD-10-CM | POA: Diagnosis not present

## 2017-10-03 DIAGNOSIS — N186 End stage renal disease: Secondary | ICD-10-CM | POA: Diagnosis not present

## 2017-10-03 DIAGNOSIS — D500 Iron deficiency anemia secondary to blood loss (chronic): Secondary | ICD-10-CM | POA: Diagnosis not present

## 2017-10-03 DIAGNOSIS — D631 Anemia in chronic kidney disease: Secondary | ICD-10-CM | POA: Diagnosis not present

## 2017-10-05 DIAGNOSIS — N186 End stage renal disease: Secondary | ICD-10-CM | POA: Diagnosis not present

## 2017-10-05 DIAGNOSIS — E8809 Other disorders of plasma-protein metabolism, not elsewhere classified: Secondary | ICD-10-CM | POA: Diagnosis not present

## 2017-10-05 DIAGNOSIS — N2581 Secondary hyperparathyroidism of renal origin: Secondary | ICD-10-CM | POA: Diagnosis not present

## 2017-10-05 DIAGNOSIS — D631 Anemia in chronic kidney disease: Secondary | ICD-10-CM | POA: Diagnosis not present

## 2017-10-05 DIAGNOSIS — D500 Iron deficiency anemia secondary to blood loss (chronic): Secondary | ICD-10-CM | POA: Diagnosis not present

## 2017-10-08 DIAGNOSIS — D631 Anemia in chronic kidney disease: Secondary | ICD-10-CM | POA: Diagnosis not present

## 2017-10-08 DIAGNOSIS — N186 End stage renal disease: Secondary | ICD-10-CM | POA: Diagnosis not present

## 2017-10-08 DIAGNOSIS — E8809 Other disorders of plasma-protein metabolism, not elsewhere classified: Secondary | ICD-10-CM | POA: Diagnosis not present

## 2017-10-08 DIAGNOSIS — D500 Iron deficiency anemia secondary to blood loss (chronic): Secondary | ICD-10-CM | POA: Diagnosis not present

## 2017-10-08 DIAGNOSIS — N2581 Secondary hyperparathyroidism of renal origin: Secondary | ICD-10-CM | POA: Diagnosis not present

## 2017-10-10 DIAGNOSIS — D500 Iron deficiency anemia secondary to blood loss (chronic): Secondary | ICD-10-CM | POA: Diagnosis not present

## 2017-10-10 DIAGNOSIS — E8809 Other disorders of plasma-protein metabolism, not elsewhere classified: Secondary | ICD-10-CM | POA: Diagnosis not present

## 2017-10-10 DIAGNOSIS — N2581 Secondary hyperparathyroidism of renal origin: Secondary | ICD-10-CM | POA: Diagnosis not present

## 2017-10-10 DIAGNOSIS — N186 End stage renal disease: Secondary | ICD-10-CM | POA: Diagnosis not present

## 2017-10-10 DIAGNOSIS — D631 Anemia in chronic kidney disease: Secondary | ICD-10-CM | POA: Diagnosis not present

## 2017-10-12 DIAGNOSIS — D631 Anemia in chronic kidney disease: Secondary | ICD-10-CM | POA: Diagnosis not present

## 2017-10-12 DIAGNOSIS — E8809 Other disorders of plasma-protein metabolism, not elsewhere classified: Secondary | ICD-10-CM | POA: Diagnosis not present

## 2017-10-12 DIAGNOSIS — N186 End stage renal disease: Secondary | ICD-10-CM | POA: Diagnosis not present

## 2017-10-12 DIAGNOSIS — D500 Iron deficiency anemia secondary to blood loss (chronic): Secondary | ICD-10-CM | POA: Diagnosis not present

## 2017-10-12 DIAGNOSIS — N2581 Secondary hyperparathyroidism of renal origin: Secondary | ICD-10-CM | POA: Diagnosis not present

## 2017-10-15 DIAGNOSIS — N186 End stage renal disease: Secondary | ICD-10-CM | POA: Diagnosis not present

## 2017-10-15 DIAGNOSIS — E8809 Other disorders of plasma-protein metabolism, not elsewhere classified: Secondary | ICD-10-CM | POA: Diagnosis not present

## 2017-10-15 DIAGNOSIS — D500 Iron deficiency anemia secondary to blood loss (chronic): Secondary | ICD-10-CM | POA: Diagnosis not present

## 2017-10-15 DIAGNOSIS — N2581 Secondary hyperparathyroidism of renal origin: Secondary | ICD-10-CM | POA: Diagnosis not present

## 2017-10-15 DIAGNOSIS — D631 Anemia in chronic kidney disease: Secondary | ICD-10-CM | POA: Diagnosis not present

## 2017-10-17 DIAGNOSIS — N2581 Secondary hyperparathyroidism of renal origin: Secondary | ICD-10-CM | POA: Diagnosis not present

## 2017-10-17 DIAGNOSIS — D500 Iron deficiency anemia secondary to blood loss (chronic): Secondary | ICD-10-CM | POA: Diagnosis not present

## 2017-10-17 DIAGNOSIS — N186 End stage renal disease: Secondary | ICD-10-CM | POA: Diagnosis not present

## 2017-10-17 DIAGNOSIS — E8809 Other disorders of plasma-protein metabolism, not elsewhere classified: Secondary | ICD-10-CM | POA: Diagnosis not present

## 2017-10-17 DIAGNOSIS — D631 Anemia in chronic kidney disease: Secondary | ICD-10-CM | POA: Diagnosis not present

## 2017-10-19 DIAGNOSIS — D500 Iron deficiency anemia secondary to blood loss (chronic): Secondary | ICD-10-CM | POA: Diagnosis not present

## 2017-10-19 DIAGNOSIS — D631 Anemia in chronic kidney disease: Secondary | ICD-10-CM | POA: Diagnosis not present

## 2017-10-19 DIAGNOSIS — E8809 Other disorders of plasma-protein metabolism, not elsewhere classified: Secondary | ICD-10-CM | POA: Diagnosis not present

## 2017-10-19 DIAGNOSIS — N186 End stage renal disease: Secondary | ICD-10-CM | POA: Diagnosis not present

## 2017-10-19 DIAGNOSIS — N2581 Secondary hyperparathyroidism of renal origin: Secondary | ICD-10-CM | POA: Diagnosis not present

## 2017-10-21 DIAGNOSIS — R110 Nausea: Secondary | ICD-10-CM | POA: Diagnosis not present

## 2017-10-21 DIAGNOSIS — T82858A Stenosis of vascular prosthetic devices, implants and grafts, initial encounter: Secondary | ICD-10-CM | POA: Diagnosis not present

## 2017-10-21 DIAGNOSIS — R52 Pain, unspecified: Secondary | ICD-10-CM | POA: Diagnosis not present

## 2017-10-21 DIAGNOSIS — E1122 Type 2 diabetes mellitus with diabetic chronic kidney disease: Secondary | ICD-10-CM | POA: Diagnosis not present

## 2017-10-21 DIAGNOSIS — T82898A Other specified complication of vascular prosthetic devices, implants and grafts, initial encounter: Secondary | ICD-10-CM | POA: Diagnosis not present

## 2017-10-22 DIAGNOSIS — N2581 Secondary hyperparathyroidism of renal origin: Secondary | ICD-10-CM | POA: Diagnosis not present

## 2017-10-22 DIAGNOSIS — D631 Anemia in chronic kidney disease: Secondary | ICD-10-CM | POA: Diagnosis not present

## 2017-10-22 DIAGNOSIS — N186 End stage renal disease: Secondary | ICD-10-CM | POA: Diagnosis not present

## 2017-10-22 DIAGNOSIS — E8809 Other disorders of plasma-protein metabolism, not elsewhere classified: Secondary | ICD-10-CM | POA: Diagnosis not present

## 2017-10-22 DIAGNOSIS — D500 Iron deficiency anemia secondary to blood loss (chronic): Secondary | ICD-10-CM | POA: Diagnosis not present

## 2017-10-24 DIAGNOSIS — D631 Anemia in chronic kidney disease: Secondary | ICD-10-CM | POA: Diagnosis not present

## 2017-10-24 DIAGNOSIS — E8809 Other disorders of plasma-protein metabolism, not elsewhere classified: Secondary | ICD-10-CM | POA: Diagnosis not present

## 2017-10-24 DIAGNOSIS — D500 Iron deficiency anemia secondary to blood loss (chronic): Secondary | ICD-10-CM | POA: Diagnosis not present

## 2017-10-24 DIAGNOSIS — N2581 Secondary hyperparathyroidism of renal origin: Secondary | ICD-10-CM | POA: Diagnosis not present

## 2017-10-24 DIAGNOSIS — N186 End stage renal disease: Secondary | ICD-10-CM | POA: Diagnosis not present

## 2017-10-26 DIAGNOSIS — N2581 Secondary hyperparathyroidism of renal origin: Secondary | ICD-10-CM | POA: Diagnosis not present

## 2017-10-26 DIAGNOSIS — N186 End stage renal disease: Secondary | ICD-10-CM | POA: Diagnosis not present

## 2017-10-26 DIAGNOSIS — D500 Iron deficiency anemia secondary to blood loss (chronic): Secondary | ICD-10-CM | POA: Diagnosis not present

## 2017-10-26 DIAGNOSIS — D631 Anemia in chronic kidney disease: Secondary | ICD-10-CM | POA: Diagnosis not present

## 2017-10-26 DIAGNOSIS — E8809 Other disorders of plasma-protein metabolism, not elsewhere classified: Secondary | ICD-10-CM | POA: Diagnosis not present

## 2017-10-28 DIAGNOSIS — E119 Type 2 diabetes mellitus without complications: Secondary | ICD-10-CM | POA: Diagnosis not present

## 2017-10-28 DIAGNOSIS — I10 Essential (primary) hypertension: Secondary | ICD-10-CM | POA: Diagnosis not present

## 2017-10-29 DIAGNOSIS — D631 Anemia in chronic kidney disease: Secondary | ICD-10-CM | POA: Diagnosis not present

## 2017-10-29 DIAGNOSIS — E8809 Other disorders of plasma-protein metabolism, not elsewhere classified: Secondary | ICD-10-CM | POA: Diagnosis not present

## 2017-10-29 DIAGNOSIS — N186 End stage renal disease: Secondary | ICD-10-CM | POA: Diagnosis not present

## 2017-10-29 DIAGNOSIS — N2581 Secondary hyperparathyroidism of renal origin: Secondary | ICD-10-CM | POA: Diagnosis not present

## 2017-10-29 DIAGNOSIS — D500 Iron deficiency anemia secondary to blood loss (chronic): Secondary | ICD-10-CM | POA: Diagnosis not present

## 2017-10-30 DIAGNOSIS — N186 End stage renal disease: Secondary | ICD-10-CM | POA: Diagnosis not present

## 2017-10-30 DIAGNOSIS — Z992 Dependence on renal dialysis: Secondary | ICD-10-CM | POA: Diagnosis not present

## 2017-10-31 DIAGNOSIS — N186 End stage renal disease: Secondary | ICD-10-CM | POA: Diagnosis not present

## 2017-10-31 DIAGNOSIS — D500 Iron deficiency anemia secondary to blood loss (chronic): Secondary | ICD-10-CM | POA: Diagnosis not present

## 2017-10-31 DIAGNOSIS — E8809 Other disorders of plasma-protein metabolism, not elsewhere classified: Secondary | ICD-10-CM | POA: Diagnosis not present

## 2017-10-31 DIAGNOSIS — D631 Anemia in chronic kidney disease: Secondary | ICD-10-CM | POA: Diagnosis not present

## 2017-10-31 DIAGNOSIS — N2581 Secondary hyperparathyroidism of renal origin: Secondary | ICD-10-CM | POA: Diagnosis not present

## 2017-10-31 DIAGNOSIS — Z23 Encounter for immunization: Secondary | ICD-10-CM | POA: Diagnosis not present

## 2017-11-02 DIAGNOSIS — E8809 Other disorders of plasma-protein metabolism, not elsewhere classified: Secondary | ICD-10-CM | POA: Diagnosis not present

## 2017-11-02 DIAGNOSIS — N2581 Secondary hyperparathyroidism of renal origin: Secondary | ICD-10-CM | POA: Diagnosis not present

## 2017-11-02 DIAGNOSIS — D631 Anemia in chronic kidney disease: Secondary | ICD-10-CM | POA: Diagnosis not present

## 2017-11-02 DIAGNOSIS — N186 End stage renal disease: Secondary | ICD-10-CM | POA: Diagnosis not present

## 2017-11-02 DIAGNOSIS — D500 Iron deficiency anemia secondary to blood loss (chronic): Secondary | ICD-10-CM | POA: Diagnosis not present

## 2017-11-05 DIAGNOSIS — E8809 Other disorders of plasma-protein metabolism, not elsewhere classified: Secondary | ICD-10-CM | POA: Diagnosis not present

## 2017-11-05 DIAGNOSIS — D500 Iron deficiency anemia secondary to blood loss (chronic): Secondary | ICD-10-CM | POA: Diagnosis not present

## 2017-11-05 DIAGNOSIS — N2581 Secondary hyperparathyroidism of renal origin: Secondary | ICD-10-CM | POA: Diagnosis not present

## 2017-11-05 DIAGNOSIS — D631 Anemia in chronic kidney disease: Secondary | ICD-10-CM | POA: Diagnosis not present

## 2017-11-05 DIAGNOSIS — N186 End stage renal disease: Secondary | ICD-10-CM | POA: Diagnosis not present

## 2017-11-07 DIAGNOSIS — N186 End stage renal disease: Secondary | ICD-10-CM | POA: Diagnosis not present

## 2017-11-07 DIAGNOSIS — E8809 Other disorders of plasma-protein metabolism, not elsewhere classified: Secondary | ICD-10-CM | POA: Diagnosis not present

## 2017-11-07 DIAGNOSIS — N2581 Secondary hyperparathyroidism of renal origin: Secondary | ICD-10-CM | POA: Diagnosis not present

## 2017-11-07 DIAGNOSIS — D500 Iron deficiency anemia secondary to blood loss (chronic): Secondary | ICD-10-CM | POA: Diagnosis not present

## 2017-11-07 DIAGNOSIS — D631 Anemia in chronic kidney disease: Secondary | ICD-10-CM | POA: Diagnosis not present

## 2017-11-09 DIAGNOSIS — E8809 Other disorders of plasma-protein metabolism, not elsewhere classified: Secondary | ICD-10-CM | POA: Diagnosis not present

## 2017-11-09 DIAGNOSIS — N2581 Secondary hyperparathyroidism of renal origin: Secondary | ICD-10-CM | POA: Diagnosis not present

## 2017-11-09 DIAGNOSIS — D500 Iron deficiency anemia secondary to blood loss (chronic): Secondary | ICD-10-CM | POA: Diagnosis not present

## 2017-11-09 DIAGNOSIS — D631 Anemia in chronic kidney disease: Secondary | ICD-10-CM | POA: Diagnosis not present

## 2017-11-09 DIAGNOSIS — N186 End stage renal disease: Secondary | ICD-10-CM | POA: Diagnosis not present

## 2017-11-12 DIAGNOSIS — N186 End stage renal disease: Secondary | ICD-10-CM | POA: Diagnosis not present

## 2017-11-12 DIAGNOSIS — D500 Iron deficiency anemia secondary to blood loss (chronic): Secondary | ICD-10-CM | POA: Diagnosis not present

## 2017-11-12 DIAGNOSIS — D631 Anemia in chronic kidney disease: Secondary | ICD-10-CM | POA: Diagnosis not present

## 2017-11-12 DIAGNOSIS — N2581 Secondary hyperparathyroidism of renal origin: Secondary | ICD-10-CM | POA: Diagnosis not present

## 2017-11-12 DIAGNOSIS — E8809 Other disorders of plasma-protein metabolism, not elsewhere classified: Secondary | ICD-10-CM | POA: Diagnosis not present

## 2017-11-14 DIAGNOSIS — N2581 Secondary hyperparathyroidism of renal origin: Secondary | ICD-10-CM | POA: Diagnosis not present

## 2017-11-14 DIAGNOSIS — D500 Iron deficiency anemia secondary to blood loss (chronic): Secondary | ICD-10-CM | POA: Diagnosis not present

## 2017-11-14 DIAGNOSIS — E8809 Other disorders of plasma-protein metabolism, not elsewhere classified: Secondary | ICD-10-CM | POA: Diagnosis not present

## 2017-11-14 DIAGNOSIS — N186 End stage renal disease: Secondary | ICD-10-CM | POA: Diagnosis not present

## 2017-11-14 DIAGNOSIS — D631 Anemia in chronic kidney disease: Secondary | ICD-10-CM | POA: Diagnosis not present

## 2017-11-16 DIAGNOSIS — E8809 Other disorders of plasma-protein metabolism, not elsewhere classified: Secondary | ICD-10-CM | POA: Diagnosis not present

## 2017-11-16 DIAGNOSIS — N2581 Secondary hyperparathyroidism of renal origin: Secondary | ICD-10-CM | POA: Diagnosis not present

## 2017-11-16 DIAGNOSIS — D500 Iron deficiency anemia secondary to blood loss (chronic): Secondary | ICD-10-CM | POA: Diagnosis not present

## 2017-11-16 DIAGNOSIS — D631 Anemia in chronic kidney disease: Secondary | ICD-10-CM | POA: Diagnosis not present

## 2017-11-16 DIAGNOSIS — N186 End stage renal disease: Secondary | ICD-10-CM | POA: Diagnosis not present

## 2017-11-18 DIAGNOSIS — L851 Acquired keratosis [keratoderma] palmaris et plantaris: Secondary | ICD-10-CM | POA: Diagnosis not present

## 2017-11-18 DIAGNOSIS — B351 Tinea unguium: Secondary | ICD-10-CM | POA: Diagnosis not present

## 2017-11-18 DIAGNOSIS — E1142 Type 2 diabetes mellitus with diabetic polyneuropathy: Secondary | ICD-10-CM | POA: Diagnosis not present

## 2017-11-19 DIAGNOSIS — E8809 Other disorders of plasma-protein metabolism, not elsewhere classified: Secondary | ICD-10-CM | POA: Diagnosis not present

## 2017-11-19 DIAGNOSIS — D631 Anemia in chronic kidney disease: Secondary | ICD-10-CM | POA: Diagnosis not present

## 2017-11-19 DIAGNOSIS — N2581 Secondary hyperparathyroidism of renal origin: Secondary | ICD-10-CM | POA: Diagnosis not present

## 2017-11-19 DIAGNOSIS — D500 Iron deficiency anemia secondary to blood loss (chronic): Secondary | ICD-10-CM | POA: Diagnosis not present

## 2017-11-19 DIAGNOSIS — N186 End stage renal disease: Secondary | ICD-10-CM | POA: Diagnosis not present

## 2017-11-21 DIAGNOSIS — N186 End stage renal disease: Secondary | ICD-10-CM | POA: Diagnosis not present

## 2017-11-21 DIAGNOSIS — D631 Anemia in chronic kidney disease: Secondary | ICD-10-CM | POA: Diagnosis not present

## 2017-11-21 DIAGNOSIS — D500 Iron deficiency anemia secondary to blood loss (chronic): Secondary | ICD-10-CM | POA: Diagnosis not present

## 2017-11-21 DIAGNOSIS — N2581 Secondary hyperparathyroidism of renal origin: Secondary | ICD-10-CM | POA: Diagnosis not present

## 2017-11-21 DIAGNOSIS — E8809 Other disorders of plasma-protein metabolism, not elsewhere classified: Secondary | ICD-10-CM | POA: Diagnosis not present

## 2017-11-23 DIAGNOSIS — D500 Iron deficiency anemia secondary to blood loss (chronic): Secondary | ICD-10-CM | POA: Diagnosis not present

## 2017-11-23 DIAGNOSIS — E8809 Other disorders of plasma-protein metabolism, not elsewhere classified: Secondary | ICD-10-CM | POA: Diagnosis not present

## 2017-11-23 DIAGNOSIS — D631 Anemia in chronic kidney disease: Secondary | ICD-10-CM | POA: Diagnosis not present

## 2017-11-23 DIAGNOSIS — N2581 Secondary hyperparathyroidism of renal origin: Secondary | ICD-10-CM | POA: Diagnosis not present

## 2017-11-23 DIAGNOSIS — N186 End stage renal disease: Secondary | ICD-10-CM | POA: Diagnosis not present

## 2017-11-26 DIAGNOSIS — D631 Anemia in chronic kidney disease: Secondary | ICD-10-CM | POA: Diagnosis not present

## 2017-11-26 DIAGNOSIS — I10 Essential (primary) hypertension: Secondary | ICD-10-CM | POA: Diagnosis not present

## 2017-11-26 DIAGNOSIS — N186 End stage renal disease: Secondary | ICD-10-CM | POA: Diagnosis not present

## 2017-11-26 DIAGNOSIS — D500 Iron deficiency anemia secondary to blood loss (chronic): Secondary | ICD-10-CM | POA: Diagnosis not present

## 2017-11-26 DIAGNOSIS — N2581 Secondary hyperparathyroidism of renal origin: Secondary | ICD-10-CM | POA: Diagnosis not present

## 2017-11-26 DIAGNOSIS — E119 Type 2 diabetes mellitus without complications: Secondary | ICD-10-CM | POA: Diagnosis not present

## 2017-11-26 DIAGNOSIS — E8809 Other disorders of plasma-protein metabolism, not elsewhere classified: Secondary | ICD-10-CM | POA: Diagnosis not present

## 2017-11-28 DIAGNOSIS — D631 Anemia in chronic kidney disease: Secondary | ICD-10-CM | POA: Diagnosis not present

## 2017-11-28 DIAGNOSIS — N2581 Secondary hyperparathyroidism of renal origin: Secondary | ICD-10-CM | POA: Diagnosis not present

## 2017-11-28 DIAGNOSIS — D500 Iron deficiency anemia secondary to blood loss (chronic): Secondary | ICD-10-CM | POA: Diagnosis not present

## 2017-11-28 DIAGNOSIS — N186 End stage renal disease: Secondary | ICD-10-CM | POA: Diagnosis not present

## 2017-11-28 DIAGNOSIS — E8809 Other disorders of plasma-protein metabolism, not elsewhere classified: Secondary | ICD-10-CM | POA: Diagnosis not present

## 2017-11-30 DIAGNOSIS — Z992 Dependence on renal dialysis: Secondary | ICD-10-CM | POA: Diagnosis not present

## 2017-11-30 DIAGNOSIS — D631 Anemia in chronic kidney disease: Secondary | ICD-10-CM | POA: Diagnosis not present

## 2017-11-30 DIAGNOSIS — N2581 Secondary hyperparathyroidism of renal origin: Secondary | ICD-10-CM | POA: Diagnosis not present

## 2017-11-30 DIAGNOSIS — E8809 Other disorders of plasma-protein metabolism, not elsewhere classified: Secondary | ICD-10-CM | POA: Diagnosis not present

## 2017-11-30 DIAGNOSIS — D500 Iron deficiency anemia secondary to blood loss (chronic): Secondary | ICD-10-CM | POA: Diagnosis not present

## 2017-11-30 DIAGNOSIS — N186 End stage renal disease: Secondary | ICD-10-CM | POA: Diagnosis not present

## 2017-12-03 DIAGNOSIS — D631 Anemia in chronic kidney disease: Secondary | ICD-10-CM | POA: Diagnosis not present

## 2017-12-03 DIAGNOSIS — N186 End stage renal disease: Secondary | ICD-10-CM | POA: Diagnosis not present

## 2017-12-03 DIAGNOSIS — N2581 Secondary hyperparathyroidism of renal origin: Secondary | ICD-10-CM | POA: Diagnosis not present

## 2017-12-03 DIAGNOSIS — D500 Iron deficiency anemia secondary to blood loss (chronic): Secondary | ICD-10-CM | POA: Diagnosis not present

## 2017-12-03 DIAGNOSIS — D6959 Other secondary thrombocytopenia: Secondary | ICD-10-CM | POA: Diagnosis not present

## 2017-12-03 DIAGNOSIS — Z23 Encounter for immunization: Secondary | ICD-10-CM | POA: Diagnosis not present

## 2017-12-05 DIAGNOSIS — D500 Iron deficiency anemia secondary to blood loss (chronic): Secondary | ICD-10-CM | POA: Diagnosis not present

## 2017-12-05 DIAGNOSIS — D6959 Other secondary thrombocytopenia: Secondary | ICD-10-CM | POA: Diagnosis not present

## 2017-12-05 DIAGNOSIS — N186 End stage renal disease: Secondary | ICD-10-CM | POA: Diagnosis not present

## 2017-12-05 DIAGNOSIS — N2581 Secondary hyperparathyroidism of renal origin: Secondary | ICD-10-CM | POA: Diagnosis not present

## 2017-12-05 DIAGNOSIS — D631 Anemia in chronic kidney disease: Secondary | ICD-10-CM | POA: Diagnosis not present

## 2017-12-07 DIAGNOSIS — D500 Iron deficiency anemia secondary to blood loss (chronic): Secondary | ICD-10-CM | POA: Diagnosis not present

## 2017-12-07 DIAGNOSIS — N186 End stage renal disease: Secondary | ICD-10-CM | POA: Diagnosis not present

## 2017-12-07 DIAGNOSIS — D631 Anemia in chronic kidney disease: Secondary | ICD-10-CM | POA: Diagnosis not present

## 2017-12-07 DIAGNOSIS — D6959 Other secondary thrombocytopenia: Secondary | ICD-10-CM | POA: Diagnosis not present

## 2017-12-07 DIAGNOSIS — N2581 Secondary hyperparathyroidism of renal origin: Secondary | ICD-10-CM | POA: Diagnosis not present

## 2017-12-10 DIAGNOSIS — N186 End stage renal disease: Secondary | ICD-10-CM | POA: Diagnosis not present

## 2017-12-10 DIAGNOSIS — D6959 Other secondary thrombocytopenia: Secondary | ICD-10-CM | POA: Diagnosis not present

## 2017-12-10 DIAGNOSIS — D631 Anemia in chronic kidney disease: Secondary | ICD-10-CM | POA: Diagnosis not present

## 2017-12-10 DIAGNOSIS — D500 Iron deficiency anemia secondary to blood loss (chronic): Secondary | ICD-10-CM | POA: Diagnosis not present

## 2017-12-10 DIAGNOSIS — N2581 Secondary hyperparathyroidism of renal origin: Secondary | ICD-10-CM | POA: Diagnosis not present

## 2017-12-12 ENCOUNTER — Encounter: Payer: Self-pay | Admitting: "Endocrinology

## 2017-12-12 DIAGNOSIS — E1129 Type 2 diabetes mellitus with other diabetic kidney complication: Secondary | ICD-10-CM | POA: Diagnosis not present

## 2017-12-12 DIAGNOSIS — D500 Iron deficiency anemia secondary to blood loss (chronic): Secondary | ICD-10-CM | POA: Diagnosis not present

## 2017-12-12 DIAGNOSIS — N2581 Secondary hyperparathyroidism of renal origin: Secondary | ICD-10-CM | POA: Diagnosis not present

## 2017-12-12 DIAGNOSIS — D631 Anemia in chronic kidney disease: Secondary | ICD-10-CM | POA: Diagnosis not present

## 2017-12-12 DIAGNOSIS — N186 End stage renal disease: Secondary | ICD-10-CM | POA: Diagnosis not present

## 2017-12-12 DIAGNOSIS — D6959 Other secondary thrombocytopenia: Secondary | ICD-10-CM | POA: Diagnosis not present

## 2017-12-14 DIAGNOSIS — D631 Anemia in chronic kidney disease: Secondary | ICD-10-CM | POA: Diagnosis not present

## 2017-12-14 DIAGNOSIS — N186 End stage renal disease: Secondary | ICD-10-CM | POA: Diagnosis not present

## 2017-12-14 DIAGNOSIS — D6959 Other secondary thrombocytopenia: Secondary | ICD-10-CM | POA: Diagnosis not present

## 2017-12-14 DIAGNOSIS — D500 Iron deficiency anemia secondary to blood loss (chronic): Secondary | ICD-10-CM | POA: Diagnosis not present

## 2017-12-14 DIAGNOSIS — N2581 Secondary hyperparathyroidism of renal origin: Secondary | ICD-10-CM | POA: Diagnosis not present

## 2017-12-17 DIAGNOSIS — N2581 Secondary hyperparathyroidism of renal origin: Secondary | ICD-10-CM | POA: Diagnosis not present

## 2017-12-17 DIAGNOSIS — D6959 Other secondary thrombocytopenia: Secondary | ICD-10-CM | POA: Diagnosis not present

## 2017-12-17 DIAGNOSIS — D631 Anemia in chronic kidney disease: Secondary | ICD-10-CM | POA: Diagnosis not present

## 2017-12-17 DIAGNOSIS — N186 End stage renal disease: Secondary | ICD-10-CM | POA: Diagnosis not present

## 2017-12-17 DIAGNOSIS — D500 Iron deficiency anemia secondary to blood loss (chronic): Secondary | ICD-10-CM | POA: Diagnosis not present

## 2017-12-19 DIAGNOSIS — D500 Iron deficiency anemia secondary to blood loss (chronic): Secondary | ICD-10-CM | POA: Diagnosis not present

## 2017-12-19 DIAGNOSIS — D6959 Other secondary thrombocytopenia: Secondary | ICD-10-CM | POA: Diagnosis not present

## 2017-12-19 DIAGNOSIS — D631 Anemia in chronic kidney disease: Secondary | ICD-10-CM | POA: Diagnosis not present

## 2017-12-19 DIAGNOSIS — N186 End stage renal disease: Secondary | ICD-10-CM | POA: Diagnosis not present

## 2017-12-19 DIAGNOSIS — N2581 Secondary hyperparathyroidism of renal origin: Secondary | ICD-10-CM | POA: Diagnosis not present

## 2017-12-21 DIAGNOSIS — N2581 Secondary hyperparathyroidism of renal origin: Secondary | ICD-10-CM | POA: Diagnosis not present

## 2017-12-21 DIAGNOSIS — D500 Iron deficiency anemia secondary to blood loss (chronic): Secondary | ICD-10-CM | POA: Diagnosis not present

## 2017-12-21 DIAGNOSIS — N186 End stage renal disease: Secondary | ICD-10-CM | POA: Diagnosis not present

## 2017-12-21 DIAGNOSIS — D631 Anemia in chronic kidney disease: Secondary | ICD-10-CM | POA: Diagnosis not present

## 2017-12-21 DIAGNOSIS — D6959 Other secondary thrombocytopenia: Secondary | ICD-10-CM | POA: Diagnosis not present

## 2017-12-23 DIAGNOSIS — I10 Essential (primary) hypertension: Secondary | ICD-10-CM | POA: Diagnosis not present

## 2017-12-23 DIAGNOSIS — E119 Type 2 diabetes mellitus without complications: Secondary | ICD-10-CM | POA: Diagnosis not present

## 2017-12-24 DIAGNOSIS — D631 Anemia in chronic kidney disease: Secondary | ICD-10-CM | POA: Diagnosis not present

## 2017-12-24 DIAGNOSIS — N186 End stage renal disease: Secondary | ICD-10-CM | POA: Diagnosis not present

## 2017-12-24 DIAGNOSIS — D500 Iron deficiency anemia secondary to blood loss (chronic): Secondary | ICD-10-CM | POA: Diagnosis not present

## 2017-12-24 DIAGNOSIS — N2581 Secondary hyperparathyroidism of renal origin: Secondary | ICD-10-CM | POA: Diagnosis not present

## 2017-12-24 DIAGNOSIS — D6959 Other secondary thrombocytopenia: Secondary | ICD-10-CM | POA: Diagnosis not present

## 2017-12-26 DIAGNOSIS — N186 End stage renal disease: Secondary | ICD-10-CM | POA: Diagnosis not present

## 2017-12-26 DIAGNOSIS — D6959 Other secondary thrombocytopenia: Secondary | ICD-10-CM | POA: Diagnosis not present

## 2017-12-26 DIAGNOSIS — N2581 Secondary hyperparathyroidism of renal origin: Secondary | ICD-10-CM | POA: Diagnosis not present

## 2017-12-26 DIAGNOSIS — D500 Iron deficiency anemia secondary to blood loss (chronic): Secondary | ICD-10-CM | POA: Diagnosis not present

## 2017-12-26 DIAGNOSIS — D631 Anemia in chronic kidney disease: Secondary | ICD-10-CM | POA: Diagnosis not present

## 2017-12-28 DIAGNOSIS — D631 Anemia in chronic kidney disease: Secondary | ICD-10-CM | POA: Diagnosis not present

## 2017-12-28 DIAGNOSIS — N2581 Secondary hyperparathyroidism of renal origin: Secondary | ICD-10-CM | POA: Diagnosis not present

## 2017-12-28 DIAGNOSIS — D500 Iron deficiency anemia secondary to blood loss (chronic): Secondary | ICD-10-CM | POA: Diagnosis not present

## 2017-12-28 DIAGNOSIS — D6959 Other secondary thrombocytopenia: Secondary | ICD-10-CM | POA: Diagnosis not present

## 2017-12-28 DIAGNOSIS — N186 End stage renal disease: Secondary | ICD-10-CM | POA: Diagnosis not present

## 2017-12-30 DIAGNOSIS — Z992 Dependence on renal dialysis: Secondary | ICD-10-CM | POA: Diagnosis not present

## 2017-12-30 DIAGNOSIS — N186 End stage renal disease: Secondary | ICD-10-CM | POA: Diagnosis not present

## 2017-12-31 DIAGNOSIS — D631 Anemia in chronic kidney disease: Secondary | ICD-10-CM | POA: Diagnosis not present

## 2017-12-31 DIAGNOSIS — N186 End stage renal disease: Secondary | ICD-10-CM | POA: Diagnosis not present

## 2017-12-31 DIAGNOSIS — N2581 Secondary hyperparathyroidism of renal origin: Secondary | ICD-10-CM | POA: Diagnosis not present

## 2017-12-31 DIAGNOSIS — D500 Iron deficiency anemia secondary to blood loss (chronic): Secondary | ICD-10-CM | POA: Diagnosis not present

## 2017-12-31 DIAGNOSIS — E8809 Other disorders of plasma-protein metabolism, not elsewhere classified: Secondary | ICD-10-CM | POA: Diagnosis not present

## 2018-01-02 DIAGNOSIS — N186 End stage renal disease: Secondary | ICD-10-CM | POA: Diagnosis not present

## 2018-01-02 DIAGNOSIS — E8809 Other disorders of plasma-protein metabolism, not elsewhere classified: Secondary | ICD-10-CM | POA: Diagnosis not present

## 2018-01-02 DIAGNOSIS — N2581 Secondary hyperparathyroidism of renal origin: Secondary | ICD-10-CM | POA: Diagnosis not present

## 2018-01-02 DIAGNOSIS — D631 Anemia in chronic kidney disease: Secondary | ICD-10-CM | POA: Diagnosis not present

## 2018-01-02 DIAGNOSIS — D500 Iron deficiency anemia secondary to blood loss (chronic): Secondary | ICD-10-CM | POA: Diagnosis not present

## 2018-01-04 DIAGNOSIS — D500 Iron deficiency anemia secondary to blood loss (chronic): Secondary | ICD-10-CM | POA: Diagnosis not present

## 2018-01-04 DIAGNOSIS — D631 Anemia in chronic kidney disease: Secondary | ICD-10-CM | POA: Diagnosis not present

## 2018-01-04 DIAGNOSIS — N2581 Secondary hyperparathyroidism of renal origin: Secondary | ICD-10-CM | POA: Diagnosis not present

## 2018-01-04 DIAGNOSIS — N186 End stage renal disease: Secondary | ICD-10-CM | POA: Diagnosis not present

## 2018-01-04 DIAGNOSIS — E8809 Other disorders of plasma-protein metabolism, not elsewhere classified: Secondary | ICD-10-CM | POA: Diagnosis not present

## 2018-01-07 DIAGNOSIS — D631 Anemia in chronic kidney disease: Secondary | ICD-10-CM | POA: Diagnosis not present

## 2018-01-07 DIAGNOSIS — N186 End stage renal disease: Secondary | ICD-10-CM | POA: Diagnosis not present

## 2018-01-07 DIAGNOSIS — D500 Iron deficiency anemia secondary to blood loss (chronic): Secondary | ICD-10-CM | POA: Diagnosis not present

## 2018-01-07 DIAGNOSIS — E8809 Other disorders of plasma-protein metabolism, not elsewhere classified: Secondary | ICD-10-CM | POA: Diagnosis not present

## 2018-01-07 DIAGNOSIS — N2581 Secondary hyperparathyroidism of renal origin: Secondary | ICD-10-CM | POA: Diagnosis not present

## 2018-01-09 DIAGNOSIS — N2581 Secondary hyperparathyroidism of renal origin: Secondary | ICD-10-CM | POA: Diagnosis not present

## 2018-01-09 DIAGNOSIS — D631 Anemia in chronic kidney disease: Secondary | ICD-10-CM | POA: Diagnosis not present

## 2018-01-09 DIAGNOSIS — E8809 Other disorders of plasma-protein metabolism, not elsewhere classified: Secondary | ICD-10-CM | POA: Diagnosis not present

## 2018-01-09 DIAGNOSIS — N186 End stage renal disease: Secondary | ICD-10-CM | POA: Diagnosis not present

## 2018-01-09 DIAGNOSIS — D500 Iron deficiency anemia secondary to blood loss (chronic): Secondary | ICD-10-CM | POA: Diagnosis not present

## 2018-01-10 DIAGNOSIS — Z299 Encounter for prophylactic measures, unspecified: Secondary | ICD-10-CM | POA: Diagnosis not present

## 2018-01-10 DIAGNOSIS — I10 Essential (primary) hypertension: Secondary | ICD-10-CM | POA: Diagnosis not present

## 2018-01-10 DIAGNOSIS — E1165 Type 2 diabetes mellitus with hyperglycemia: Secondary | ICD-10-CM | POA: Diagnosis not present

## 2018-01-10 DIAGNOSIS — E1122 Type 2 diabetes mellitus with diabetic chronic kidney disease: Secondary | ICD-10-CM | POA: Diagnosis not present

## 2018-01-10 DIAGNOSIS — Z9114 Patient's other noncompliance with medication regimen: Secondary | ICD-10-CM | POA: Diagnosis not present

## 2018-01-10 DIAGNOSIS — Z6826 Body mass index (BMI) 26.0-26.9, adult: Secondary | ICD-10-CM | POA: Diagnosis not present

## 2018-01-11 DIAGNOSIS — D500 Iron deficiency anemia secondary to blood loss (chronic): Secondary | ICD-10-CM | POA: Diagnosis not present

## 2018-01-11 DIAGNOSIS — E8809 Other disorders of plasma-protein metabolism, not elsewhere classified: Secondary | ICD-10-CM | POA: Diagnosis not present

## 2018-01-11 DIAGNOSIS — N2581 Secondary hyperparathyroidism of renal origin: Secondary | ICD-10-CM | POA: Diagnosis not present

## 2018-01-11 DIAGNOSIS — D631 Anemia in chronic kidney disease: Secondary | ICD-10-CM | POA: Diagnosis not present

## 2018-01-11 DIAGNOSIS — N186 End stage renal disease: Secondary | ICD-10-CM | POA: Diagnosis not present

## 2018-01-14 DIAGNOSIS — D631 Anemia in chronic kidney disease: Secondary | ICD-10-CM | POA: Diagnosis not present

## 2018-01-14 DIAGNOSIS — N186 End stage renal disease: Secondary | ICD-10-CM | POA: Diagnosis not present

## 2018-01-14 DIAGNOSIS — D500 Iron deficiency anemia secondary to blood loss (chronic): Secondary | ICD-10-CM | POA: Diagnosis not present

## 2018-01-14 DIAGNOSIS — N2581 Secondary hyperparathyroidism of renal origin: Secondary | ICD-10-CM | POA: Diagnosis not present

## 2018-01-14 DIAGNOSIS — E8809 Other disorders of plasma-protein metabolism, not elsewhere classified: Secondary | ICD-10-CM | POA: Diagnosis not present

## 2018-01-16 DIAGNOSIS — N2581 Secondary hyperparathyroidism of renal origin: Secondary | ICD-10-CM | POA: Diagnosis not present

## 2018-01-16 DIAGNOSIS — D500 Iron deficiency anemia secondary to blood loss (chronic): Secondary | ICD-10-CM | POA: Diagnosis not present

## 2018-01-16 DIAGNOSIS — D631 Anemia in chronic kidney disease: Secondary | ICD-10-CM | POA: Diagnosis not present

## 2018-01-16 DIAGNOSIS — E8809 Other disorders of plasma-protein metabolism, not elsewhere classified: Secondary | ICD-10-CM | POA: Diagnosis not present

## 2018-01-16 DIAGNOSIS — N186 End stage renal disease: Secondary | ICD-10-CM | POA: Diagnosis not present

## 2018-01-18 DIAGNOSIS — E8809 Other disorders of plasma-protein metabolism, not elsewhere classified: Secondary | ICD-10-CM | POA: Diagnosis not present

## 2018-01-18 DIAGNOSIS — D631 Anemia in chronic kidney disease: Secondary | ICD-10-CM | POA: Diagnosis not present

## 2018-01-18 DIAGNOSIS — N2581 Secondary hyperparathyroidism of renal origin: Secondary | ICD-10-CM | POA: Diagnosis not present

## 2018-01-18 DIAGNOSIS — D500 Iron deficiency anemia secondary to blood loss (chronic): Secondary | ICD-10-CM | POA: Diagnosis not present

## 2018-01-18 DIAGNOSIS — N186 End stage renal disease: Secondary | ICD-10-CM | POA: Diagnosis not present

## 2018-01-21 DIAGNOSIS — N2581 Secondary hyperparathyroidism of renal origin: Secondary | ICD-10-CM | POA: Diagnosis not present

## 2018-01-21 DIAGNOSIS — D631 Anemia in chronic kidney disease: Secondary | ICD-10-CM | POA: Diagnosis not present

## 2018-01-21 DIAGNOSIS — D500 Iron deficiency anemia secondary to blood loss (chronic): Secondary | ICD-10-CM | POA: Diagnosis not present

## 2018-01-21 DIAGNOSIS — N186 End stage renal disease: Secondary | ICD-10-CM | POA: Diagnosis not present

## 2018-01-21 DIAGNOSIS — E8809 Other disorders of plasma-protein metabolism, not elsewhere classified: Secondary | ICD-10-CM | POA: Diagnosis not present

## 2018-01-23 DIAGNOSIS — D631 Anemia in chronic kidney disease: Secondary | ICD-10-CM | POA: Diagnosis not present

## 2018-01-23 DIAGNOSIS — N2581 Secondary hyperparathyroidism of renal origin: Secondary | ICD-10-CM | POA: Diagnosis not present

## 2018-01-23 DIAGNOSIS — N186 End stage renal disease: Secondary | ICD-10-CM | POA: Diagnosis not present

## 2018-01-23 DIAGNOSIS — D500 Iron deficiency anemia secondary to blood loss (chronic): Secondary | ICD-10-CM | POA: Diagnosis not present

## 2018-01-23 DIAGNOSIS — E8809 Other disorders of plasma-protein metabolism, not elsewhere classified: Secondary | ICD-10-CM | POA: Diagnosis not present

## 2018-01-24 DIAGNOSIS — Z299 Encounter for prophylactic measures, unspecified: Secondary | ICD-10-CM | POA: Diagnosis not present

## 2018-01-24 DIAGNOSIS — Z6826 Body mass index (BMI) 26.0-26.9, adult: Secondary | ICD-10-CM | POA: Diagnosis not present

## 2018-01-24 DIAGNOSIS — J61 Pneumoconiosis due to asbestos and other mineral fibers: Secondary | ICD-10-CM | POA: Diagnosis not present

## 2018-01-24 DIAGNOSIS — E1122 Type 2 diabetes mellitus with diabetic chronic kidney disease: Secondary | ICD-10-CM | POA: Diagnosis not present

## 2018-01-24 DIAGNOSIS — I10 Essential (primary) hypertension: Secondary | ICD-10-CM | POA: Diagnosis not present

## 2018-01-24 DIAGNOSIS — N186 End stage renal disease: Secondary | ICD-10-CM | POA: Diagnosis not present

## 2018-01-24 DIAGNOSIS — E1165 Type 2 diabetes mellitus with hyperglycemia: Secondary | ICD-10-CM | POA: Diagnosis not present

## 2018-01-25 DIAGNOSIS — D500 Iron deficiency anemia secondary to blood loss (chronic): Secondary | ICD-10-CM | POA: Diagnosis not present

## 2018-01-25 DIAGNOSIS — E8809 Other disorders of plasma-protein metabolism, not elsewhere classified: Secondary | ICD-10-CM | POA: Diagnosis not present

## 2018-01-25 DIAGNOSIS — D631 Anemia in chronic kidney disease: Secondary | ICD-10-CM | POA: Diagnosis not present

## 2018-01-25 DIAGNOSIS — N186 End stage renal disease: Secondary | ICD-10-CM | POA: Diagnosis not present

## 2018-01-25 DIAGNOSIS — N2581 Secondary hyperparathyroidism of renal origin: Secondary | ICD-10-CM | POA: Diagnosis not present

## 2018-01-27 DIAGNOSIS — L851 Acquired keratosis [keratoderma] palmaris et plantaris: Secondary | ICD-10-CM | POA: Diagnosis not present

## 2018-01-27 DIAGNOSIS — I10 Essential (primary) hypertension: Secondary | ICD-10-CM | POA: Diagnosis not present

## 2018-01-27 DIAGNOSIS — E119 Type 2 diabetes mellitus without complications: Secondary | ICD-10-CM | POA: Diagnosis not present

## 2018-01-27 DIAGNOSIS — E1142 Type 2 diabetes mellitus with diabetic polyneuropathy: Secondary | ICD-10-CM | POA: Diagnosis not present

## 2018-01-27 DIAGNOSIS — B351 Tinea unguium: Secondary | ICD-10-CM | POA: Diagnosis not present

## 2018-01-28 DIAGNOSIS — D500 Iron deficiency anemia secondary to blood loss (chronic): Secondary | ICD-10-CM | POA: Diagnosis not present

## 2018-01-28 DIAGNOSIS — N186 End stage renal disease: Secondary | ICD-10-CM | POA: Diagnosis not present

## 2018-01-28 DIAGNOSIS — N2581 Secondary hyperparathyroidism of renal origin: Secondary | ICD-10-CM | POA: Diagnosis not present

## 2018-01-28 DIAGNOSIS — E8809 Other disorders of plasma-protein metabolism, not elsewhere classified: Secondary | ICD-10-CM | POA: Diagnosis not present

## 2018-01-28 DIAGNOSIS — D631 Anemia in chronic kidney disease: Secondary | ICD-10-CM | POA: Diagnosis not present

## 2018-01-30 DIAGNOSIS — D500 Iron deficiency anemia secondary to blood loss (chronic): Secondary | ICD-10-CM | POA: Diagnosis not present

## 2018-01-30 DIAGNOSIS — N186 End stage renal disease: Secondary | ICD-10-CM | POA: Diagnosis not present

## 2018-01-30 DIAGNOSIS — D631 Anemia in chronic kidney disease: Secondary | ICD-10-CM | POA: Diagnosis not present

## 2018-01-30 DIAGNOSIS — N2581 Secondary hyperparathyroidism of renal origin: Secondary | ICD-10-CM | POA: Diagnosis not present

## 2018-01-30 DIAGNOSIS — Z992 Dependence on renal dialysis: Secondary | ICD-10-CM | POA: Diagnosis not present

## 2018-01-30 DIAGNOSIS — E8809 Other disorders of plasma-protein metabolism, not elsewhere classified: Secondary | ICD-10-CM | POA: Diagnosis not present

## 2018-02-01 DIAGNOSIS — E8809 Other disorders of plasma-protein metabolism, not elsewhere classified: Secondary | ICD-10-CM | POA: Diagnosis not present

## 2018-02-01 DIAGNOSIS — N186 End stage renal disease: Secondary | ICD-10-CM | POA: Diagnosis not present

## 2018-02-01 DIAGNOSIS — N2581 Secondary hyperparathyroidism of renal origin: Secondary | ICD-10-CM | POA: Diagnosis not present

## 2018-02-01 DIAGNOSIS — D500 Iron deficiency anemia secondary to blood loss (chronic): Secondary | ICD-10-CM | POA: Diagnosis not present

## 2018-02-01 DIAGNOSIS — D631 Anemia in chronic kidney disease: Secondary | ICD-10-CM | POA: Diagnosis not present

## 2018-02-01 DIAGNOSIS — E1129 Type 2 diabetes mellitus with other diabetic kidney complication: Secondary | ICD-10-CM | POA: Diagnosis not present

## 2018-02-04 DIAGNOSIS — E8809 Other disorders of plasma-protein metabolism, not elsewhere classified: Secondary | ICD-10-CM | POA: Diagnosis not present

## 2018-02-04 DIAGNOSIS — N186 End stage renal disease: Secondary | ICD-10-CM | POA: Diagnosis not present

## 2018-02-04 DIAGNOSIS — D500 Iron deficiency anemia secondary to blood loss (chronic): Secondary | ICD-10-CM | POA: Diagnosis not present

## 2018-02-04 DIAGNOSIS — N2581 Secondary hyperparathyroidism of renal origin: Secondary | ICD-10-CM | POA: Diagnosis not present

## 2018-02-04 DIAGNOSIS — D631 Anemia in chronic kidney disease: Secondary | ICD-10-CM | POA: Diagnosis not present

## 2018-02-04 DIAGNOSIS — E1129 Type 2 diabetes mellitus with other diabetic kidney complication: Secondary | ICD-10-CM | POA: Diagnosis not present

## 2018-02-06 ENCOUNTER — Ambulatory Visit: Payer: Self-pay | Admitting: "Endocrinology

## 2018-02-06 DIAGNOSIS — E8809 Other disorders of plasma-protein metabolism, not elsewhere classified: Secondary | ICD-10-CM | POA: Diagnosis not present

## 2018-02-06 DIAGNOSIS — E1129 Type 2 diabetes mellitus with other diabetic kidney complication: Secondary | ICD-10-CM | POA: Diagnosis not present

## 2018-02-06 DIAGNOSIS — D631 Anemia in chronic kidney disease: Secondary | ICD-10-CM | POA: Diagnosis not present

## 2018-02-06 DIAGNOSIS — D500 Iron deficiency anemia secondary to blood loss (chronic): Secondary | ICD-10-CM | POA: Diagnosis not present

## 2018-02-06 DIAGNOSIS — N186 End stage renal disease: Secondary | ICD-10-CM | POA: Diagnosis not present

## 2018-02-06 DIAGNOSIS — N2581 Secondary hyperparathyroidism of renal origin: Secondary | ICD-10-CM | POA: Diagnosis not present

## 2018-02-07 ENCOUNTER — Ambulatory Visit: Payer: Self-pay | Admitting: "Endocrinology

## 2018-02-08 DIAGNOSIS — E8809 Other disorders of plasma-protein metabolism, not elsewhere classified: Secondary | ICD-10-CM | POA: Diagnosis not present

## 2018-02-08 DIAGNOSIS — D631 Anemia in chronic kidney disease: Secondary | ICD-10-CM | POA: Diagnosis not present

## 2018-02-08 DIAGNOSIS — D500 Iron deficiency anemia secondary to blood loss (chronic): Secondary | ICD-10-CM | POA: Diagnosis not present

## 2018-02-08 DIAGNOSIS — N186 End stage renal disease: Secondary | ICD-10-CM | POA: Diagnosis not present

## 2018-02-08 DIAGNOSIS — E1129 Type 2 diabetes mellitus with other diabetic kidney complication: Secondary | ICD-10-CM | POA: Diagnosis not present

## 2018-02-08 DIAGNOSIS — N2581 Secondary hyperparathyroidism of renal origin: Secondary | ICD-10-CM | POA: Diagnosis not present

## 2018-02-11 DIAGNOSIS — N2581 Secondary hyperparathyroidism of renal origin: Secondary | ICD-10-CM | POA: Diagnosis not present

## 2018-02-11 DIAGNOSIS — N186 End stage renal disease: Secondary | ICD-10-CM | POA: Diagnosis not present

## 2018-02-11 DIAGNOSIS — E1129 Type 2 diabetes mellitus with other diabetic kidney complication: Secondary | ICD-10-CM | POA: Diagnosis not present

## 2018-02-11 DIAGNOSIS — E8809 Other disorders of plasma-protein metabolism, not elsewhere classified: Secondary | ICD-10-CM | POA: Diagnosis not present

## 2018-02-11 DIAGNOSIS — D631 Anemia in chronic kidney disease: Secondary | ICD-10-CM | POA: Diagnosis not present

## 2018-02-11 DIAGNOSIS — D500 Iron deficiency anemia secondary to blood loss (chronic): Secondary | ICD-10-CM | POA: Diagnosis not present

## 2018-02-12 DIAGNOSIS — Z1211 Encounter for screening for malignant neoplasm of colon: Secondary | ICD-10-CM | POA: Diagnosis not present

## 2018-02-12 DIAGNOSIS — Z7189 Other specified counseling: Secondary | ICD-10-CM | POA: Diagnosis not present

## 2018-02-12 DIAGNOSIS — E1122 Type 2 diabetes mellitus with diabetic chronic kidney disease: Secondary | ICD-10-CM | POA: Diagnosis not present

## 2018-02-12 DIAGNOSIS — N186 End stage renal disease: Secondary | ICD-10-CM | POA: Diagnosis not present

## 2018-02-12 DIAGNOSIS — Z6826 Body mass index (BMI) 26.0-26.9, adult: Secondary | ICD-10-CM | POA: Diagnosis not present

## 2018-02-12 DIAGNOSIS — Z0000 Encounter for general adult medical examination without abnormal findings: Secondary | ICD-10-CM | POA: Diagnosis not present

## 2018-02-12 DIAGNOSIS — Z1339 Encounter for screening examination for other mental health and behavioral disorders: Secondary | ICD-10-CM | POA: Diagnosis not present

## 2018-02-12 DIAGNOSIS — Z1331 Encounter for screening for depression: Secondary | ICD-10-CM | POA: Diagnosis not present

## 2018-02-12 DIAGNOSIS — Z299 Encounter for prophylactic measures, unspecified: Secondary | ICD-10-CM | POA: Diagnosis not present

## 2018-02-12 DIAGNOSIS — I10 Essential (primary) hypertension: Secondary | ICD-10-CM | POA: Diagnosis not present

## 2018-02-12 DIAGNOSIS — E1165 Type 2 diabetes mellitus with hyperglycemia: Secondary | ICD-10-CM | POA: Diagnosis not present

## 2018-02-13 DIAGNOSIS — E8809 Other disorders of plasma-protein metabolism, not elsewhere classified: Secondary | ICD-10-CM | POA: Diagnosis not present

## 2018-02-13 DIAGNOSIS — D500 Iron deficiency anemia secondary to blood loss (chronic): Secondary | ICD-10-CM | POA: Diagnosis not present

## 2018-02-13 DIAGNOSIS — N2581 Secondary hyperparathyroidism of renal origin: Secondary | ICD-10-CM | POA: Diagnosis not present

## 2018-02-13 DIAGNOSIS — N186 End stage renal disease: Secondary | ICD-10-CM | POA: Diagnosis not present

## 2018-02-13 DIAGNOSIS — E1129 Type 2 diabetes mellitus with other diabetic kidney complication: Secondary | ICD-10-CM | POA: Diagnosis not present

## 2018-02-13 DIAGNOSIS — D631 Anemia in chronic kidney disease: Secondary | ICD-10-CM | POA: Diagnosis not present

## 2018-02-15 DIAGNOSIS — D500 Iron deficiency anemia secondary to blood loss (chronic): Secondary | ICD-10-CM | POA: Diagnosis not present

## 2018-02-15 DIAGNOSIS — D631 Anemia in chronic kidney disease: Secondary | ICD-10-CM | POA: Diagnosis not present

## 2018-02-15 DIAGNOSIS — E1129 Type 2 diabetes mellitus with other diabetic kidney complication: Secondary | ICD-10-CM | POA: Diagnosis not present

## 2018-02-15 DIAGNOSIS — N2581 Secondary hyperparathyroidism of renal origin: Secondary | ICD-10-CM | POA: Diagnosis not present

## 2018-02-15 DIAGNOSIS — E8809 Other disorders of plasma-protein metabolism, not elsewhere classified: Secondary | ICD-10-CM | POA: Diagnosis not present

## 2018-02-15 DIAGNOSIS — N186 End stage renal disease: Secondary | ICD-10-CM | POA: Diagnosis not present

## 2018-02-18 DIAGNOSIS — N186 End stage renal disease: Secondary | ICD-10-CM | POA: Diagnosis not present

## 2018-02-18 DIAGNOSIS — D631 Anemia in chronic kidney disease: Secondary | ICD-10-CM | POA: Diagnosis not present

## 2018-02-18 DIAGNOSIS — D500 Iron deficiency anemia secondary to blood loss (chronic): Secondary | ICD-10-CM | POA: Diagnosis not present

## 2018-02-18 DIAGNOSIS — E8809 Other disorders of plasma-protein metabolism, not elsewhere classified: Secondary | ICD-10-CM | POA: Diagnosis not present

## 2018-02-18 DIAGNOSIS — E1129 Type 2 diabetes mellitus with other diabetic kidney complication: Secondary | ICD-10-CM | POA: Diagnosis not present

## 2018-02-18 DIAGNOSIS — N2581 Secondary hyperparathyroidism of renal origin: Secondary | ICD-10-CM | POA: Diagnosis not present

## 2018-02-20 DIAGNOSIS — D631 Anemia in chronic kidney disease: Secondary | ICD-10-CM | POA: Diagnosis not present

## 2018-02-20 DIAGNOSIS — N186 End stage renal disease: Secondary | ICD-10-CM | POA: Diagnosis not present

## 2018-02-20 DIAGNOSIS — E1129 Type 2 diabetes mellitus with other diabetic kidney complication: Secondary | ICD-10-CM | POA: Diagnosis not present

## 2018-02-20 DIAGNOSIS — N2581 Secondary hyperparathyroidism of renal origin: Secondary | ICD-10-CM | POA: Diagnosis not present

## 2018-02-20 DIAGNOSIS — E8809 Other disorders of plasma-protein metabolism, not elsewhere classified: Secondary | ICD-10-CM | POA: Diagnosis not present

## 2018-02-20 DIAGNOSIS — D500 Iron deficiency anemia secondary to blood loss (chronic): Secondary | ICD-10-CM | POA: Diagnosis not present

## 2018-02-22 DIAGNOSIS — D631 Anemia in chronic kidney disease: Secondary | ICD-10-CM | POA: Diagnosis not present

## 2018-02-22 DIAGNOSIS — E1129 Type 2 diabetes mellitus with other diabetic kidney complication: Secondary | ICD-10-CM | POA: Diagnosis not present

## 2018-02-22 DIAGNOSIS — N186 End stage renal disease: Secondary | ICD-10-CM | POA: Diagnosis not present

## 2018-02-22 DIAGNOSIS — E8809 Other disorders of plasma-protein metabolism, not elsewhere classified: Secondary | ICD-10-CM | POA: Diagnosis not present

## 2018-02-22 DIAGNOSIS — N2581 Secondary hyperparathyroidism of renal origin: Secondary | ICD-10-CM | POA: Diagnosis not present

## 2018-02-22 DIAGNOSIS — D500 Iron deficiency anemia secondary to blood loss (chronic): Secondary | ICD-10-CM | POA: Diagnosis not present

## 2018-02-24 DIAGNOSIS — E8809 Other disorders of plasma-protein metabolism, not elsewhere classified: Secondary | ICD-10-CM | POA: Diagnosis not present

## 2018-02-24 DIAGNOSIS — D631 Anemia in chronic kidney disease: Secondary | ICD-10-CM | POA: Diagnosis not present

## 2018-02-24 DIAGNOSIS — D500 Iron deficiency anemia secondary to blood loss (chronic): Secondary | ICD-10-CM | POA: Diagnosis not present

## 2018-02-24 DIAGNOSIS — E1129 Type 2 diabetes mellitus with other diabetic kidney complication: Secondary | ICD-10-CM | POA: Diagnosis not present

## 2018-02-24 DIAGNOSIS — N186 End stage renal disease: Secondary | ICD-10-CM | POA: Diagnosis not present

## 2018-02-24 DIAGNOSIS — N2581 Secondary hyperparathyroidism of renal origin: Secondary | ICD-10-CM | POA: Diagnosis not present

## 2018-02-26 DIAGNOSIS — E8809 Other disorders of plasma-protein metabolism, not elsewhere classified: Secondary | ICD-10-CM | POA: Diagnosis not present

## 2018-02-26 DIAGNOSIS — E1129 Type 2 diabetes mellitus with other diabetic kidney complication: Secondary | ICD-10-CM | POA: Diagnosis not present

## 2018-02-26 DIAGNOSIS — D631 Anemia in chronic kidney disease: Secondary | ICD-10-CM | POA: Diagnosis not present

## 2018-02-26 DIAGNOSIS — N2581 Secondary hyperparathyroidism of renal origin: Secondary | ICD-10-CM | POA: Diagnosis not present

## 2018-02-26 DIAGNOSIS — N186 End stage renal disease: Secondary | ICD-10-CM | POA: Diagnosis not present

## 2018-02-26 DIAGNOSIS — D500 Iron deficiency anemia secondary to blood loss (chronic): Secondary | ICD-10-CM | POA: Diagnosis not present

## 2018-03-01 DIAGNOSIS — D500 Iron deficiency anemia secondary to blood loss (chronic): Secondary | ICD-10-CM | POA: Diagnosis not present

## 2018-03-01 DIAGNOSIS — D631 Anemia in chronic kidney disease: Secondary | ICD-10-CM | POA: Diagnosis not present

## 2018-03-01 DIAGNOSIS — N2581 Secondary hyperparathyroidism of renal origin: Secondary | ICD-10-CM | POA: Diagnosis not present

## 2018-03-01 DIAGNOSIS — N186 End stage renal disease: Secondary | ICD-10-CM | POA: Diagnosis not present

## 2018-03-01 DIAGNOSIS — E1129 Type 2 diabetes mellitus with other diabetic kidney complication: Secondary | ICD-10-CM | POA: Diagnosis not present

## 2018-03-01 DIAGNOSIS — E8809 Other disorders of plasma-protein metabolism, not elsewhere classified: Secondary | ICD-10-CM | POA: Diagnosis not present

## 2018-03-01 DIAGNOSIS — Z992 Dependence on renal dialysis: Secondary | ICD-10-CM | POA: Diagnosis not present

## 2018-03-04 DIAGNOSIS — N186 End stage renal disease: Secondary | ICD-10-CM | POA: Diagnosis not present

## 2018-03-04 DIAGNOSIS — D500 Iron deficiency anemia secondary to blood loss (chronic): Secondary | ICD-10-CM | POA: Diagnosis not present

## 2018-03-04 DIAGNOSIS — D631 Anemia in chronic kidney disease: Secondary | ICD-10-CM | POA: Diagnosis not present

## 2018-03-04 DIAGNOSIS — N2581 Secondary hyperparathyroidism of renal origin: Secondary | ICD-10-CM | POA: Diagnosis not present

## 2018-03-04 DIAGNOSIS — D6959 Other secondary thrombocytopenia: Secondary | ICD-10-CM | POA: Diagnosis not present

## 2018-03-06 DIAGNOSIS — D500 Iron deficiency anemia secondary to blood loss (chronic): Secondary | ICD-10-CM | POA: Diagnosis not present

## 2018-03-06 DIAGNOSIS — D6959 Other secondary thrombocytopenia: Secondary | ICD-10-CM | POA: Diagnosis not present

## 2018-03-06 DIAGNOSIS — N186 End stage renal disease: Secondary | ICD-10-CM | POA: Diagnosis not present

## 2018-03-06 DIAGNOSIS — N2581 Secondary hyperparathyroidism of renal origin: Secondary | ICD-10-CM | POA: Diagnosis not present

## 2018-03-06 DIAGNOSIS — D631 Anemia in chronic kidney disease: Secondary | ICD-10-CM | POA: Diagnosis not present

## 2018-03-08 DIAGNOSIS — D6959 Other secondary thrombocytopenia: Secondary | ICD-10-CM | POA: Diagnosis not present

## 2018-03-08 DIAGNOSIS — D631 Anemia in chronic kidney disease: Secondary | ICD-10-CM | POA: Diagnosis not present

## 2018-03-08 DIAGNOSIS — D500 Iron deficiency anemia secondary to blood loss (chronic): Secondary | ICD-10-CM | POA: Diagnosis not present

## 2018-03-08 DIAGNOSIS — N2581 Secondary hyperparathyroidism of renal origin: Secondary | ICD-10-CM | POA: Diagnosis not present

## 2018-03-08 DIAGNOSIS — N186 End stage renal disease: Secondary | ICD-10-CM | POA: Diagnosis not present

## 2018-03-11 DIAGNOSIS — N2581 Secondary hyperparathyroidism of renal origin: Secondary | ICD-10-CM | POA: Diagnosis not present

## 2018-03-11 DIAGNOSIS — N186 End stage renal disease: Secondary | ICD-10-CM | POA: Diagnosis not present

## 2018-03-11 DIAGNOSIS — D631 Anemia in chronic kidney disease: Secondary | ICD-10-CM | POA: Diagnosis not present

## 2018-03-11 DIAGNOSIS — D6959 Other secondary thrombocytopenia: Secondary | ICD-10-CM | POA: Diagnosis not present

## 2018-03-11 DIAGNOSIS — D500 Iron deficiency anemia secondary to blood loss (chronic): Secondary | ICD-10-CM | POA: Diagnosis not present

## 2018-03-12 DIAGNOSIS — E119 Type 2 diabetes mellitus without complications: Secondary | ICD-10-CM | POA: Diagnosis not present

## 2018-03-12 DIAGNOSIS — I10 Essential (primary) hypertension: Secondary | ICD-10-CM | POA: Diagnosis not present

## 2018-03-13 DIAGNOSIS — D631 Anemia in chronic kidney disease: Secondary | ICD-10-CM | POA: Diagnosis not present

## 2018-03-13 DIAGNOSIS — N2581 Secondary hyperparathyroidism of renal origin: Secondary | ICD-10-CM | POA: Diagnosis not present

## 2018-03-13 DIAGNOSIS — N186 End stage renal disease: Secondary | ICD-10-CM | POA: Diagnosis not present

## 2018-03-13 DIAGNOSIS — D6959 Other secondary thrombocytopenia: Secondary | ICD-10-CM | POA: Diagnosis not present

## 2018-03-13 DIAGNOSIS — D500 Iron deficiency anemia secondary to blood loss (chronic): Secondary | ICD-10-CM | POA: Diagnosis not present

## 2018-03-13 DIAGNOSIS — E1129 Type 2 diabetes mellitus with other diabetic kidney complication: Secondary | ICD-10-CM | POA: Diagnosis not present

## 2018-03-14 DIAGNOSIS — Z6826 Body mass index (BMI) 26.0-26.9, adult: Secondary | ICD-10-CM | POA: Diagnosis not present

## 2018-03-14 DIAGNOSIS — Z299 Encounter for prophylactic measures, unspecified: Secondary | ICD-10-CM | POA: Diagnosis not present

## 2018-03-14 DIAGNOSIS — E1122 Type 2 diabetes mellitus with diabetic chronic kidney disease: Secondary | ICD-10-CM | POA: Diagnosis not present

## 2018-03-14 DIAGNOSIS — E1165 Type 2 diabetes mellitus with hyperglycemia: Secondary | ICD-10-CM | POA: Diagnosis not present

## 2018-03-14 DIAGNOSIS — N186 End stage renal disease: Secondary | ICD-10-CM | POA: Diagnosis not present

## 2018-03-14 DIAGNOSIS — I10 Essential (primary) hypertension: Secondary | ICD-10-CM | POA: Diagnosis not present

## 2018-03-15 DIAGNOSIS — N186 End stage renal disease: Secondary | ICD-10-CM | POA: Diagnosis not present

## 2018-03-15 DIAGNOSIS — N2581 Secondary hyperparathyroidism of renal origin: Secondary | ICD-10-CM | POA: Diagnosis not present

## 2018-03-15 DIAGNOSIS — D500 Iron deficiency anemia secondary to blood loss (chronic): Secondary | ICD-10-CM | POA: Diagnosis not present

## 2018-03-15 DIAGNOSIS — D6959 Other secondary thrombocytopenia: Secondary | ICD-10-CM | POA: Diagnosis not present

## 2018-03-15 DIAGNOSIS — D631 Anemia in chronic kidney disease: Secondary | ICD-10-CM | POA: Diagnosis not present

## 2018-03-18 DIAGNOSIS — N186 End stage renal disease: Secondary | ICD-10-CM | POA: Diagnosis not present

## 2018-03-18 DIAGNOSIS — N2581 Secondary hyperparathyroidism of renal origin: Secondary | ICD-10-CM | POA: Diagnosis not present

## 2018-03-18 DIAGNOSIS — D631 Anemia in chronic kidney disease: Secondary | ICD-10-CM | POA: Diagnosis not present

## 2018-03-18 DIAGNOSIS — D500 Iron deficiency anemia secondary to blood loss (chronic): Secondary | ICD-10-CM | POA: Diagnosis not present

## 2018-03-18 DIAGNOSIS — D6959 Other secondary thrombocytopenia: Secondary | ICD-10-CM | POA: Diagnosis not present

## 2018-03-20 DIAGNOSIS — D631 Anemia in chronic kidney disease: Secondary | ICD-10-CM | POA: Diagnosis not present

## 2018-03-20 DIAGNOSIS — D500 Iron deficiency anemia secondary to blood loss (chronic): Secondary | ICD-10-CM | POA: Diagnosis not present

## 2018-03-20 DIAGNOSIS — N2581 Secondary hyperparathyroidism of renal origin: Secondary | ICD-10-CM | POA: Diagnosis not present

## 2018-03-20 DIAGNOSIS — D6959 Other secondary thrombocytopenia: Secondary | ICD-10-CM | POA: Diagnosis not present

## 2018-03-20 DIAGNOSIS — N186 End stage renal disease: Secondary | ICD-10-CM | POA: Diagnosis not present

## 2018-03-22 DIAGNOSIS — D6959 Other secondary thrombocytopenia: Secondary | ICD-10-CM | POA: Diagnosis not present

## 2018-03-22 DIAGNOSIS — D500 Iron deficiency anemia secondary to blood loss (chronic): Secondary | ICD-10-CM | POA: Diagnosis not present

## 2018-03-22 DIAGNOSIS — N186 End stage renal disease: Secondary | ICD-10-CM | POA: Diagnosis not present

## 2018-03-22 DIAGNOSIS — D631 Anemia in chronic kidney disease: Secondary | ICD-10-CM | POA: Diagnosis not present

## 2018-03-22 DIAGNOSIS — N2581 Secondary hyperparathyroidism of renal origin: Secondary | ICD-10-CM | POA: Diagnosis not present

## 2018-03-24 DIAGNOSIS — N2581 Secondary hyperparathyroidism of renal origin: Secondary | ICD-10-CM | POA: Diagnosis not present

## 2018-03-24 DIAGNOSIS — D631 Anemia in chronic kidney disease: Secondary | ICD-10-CM | POA: Diagnosis not present

## 2018-03-24 DIAGNOSIS — D6959 Other secondary thrombocytopenia: Secondary | ICD-10-CM | POA: Diagnosis not present

## 2018-03-24 DIAGNOSIS — D500 Iron deficiency anemia secondary to blood loss (chronic): Secondary | ICD-10-CM | POA: Diagnosis not present

## 2018-03-24 DIAGNOSIS — N186 End stage renal disease: Secondary | ICD-10-CM | POA: Diagnosis not present

## 2018-03-27 DIAGNOSIS — D500 Iron deficiency anemia secondary to blood loss (chronic): Secondary | ICD-10-CM | POA: Diagnosis not present

## 2018-03-27 DIAGNOSIS — N186 End stage renal disease: Secondary | ICD-10-CM | POA: Diagnosis not present

## 2018-03-27 DIAGNOSIS — D6959 Other secondary thrombocytopenia: Secondary | ICD-10-CM | POA: Diagnosis not present

## 2018-03-27 DIAGNOSIS — D631 Anemia in chronic kidney disease: Secondary | ICD-10-CM | POA: Diagnosis not present

## 2018-03-27 DIAGNOSIS — N2581 Secondary hyperparathyroidism of renal origin: Secondary | ICD-10-CM | POA: Diagnosis not present

## 2018-03-29 DIAGNOSIS — D631 Anemia in chronic kidney disease: Secondary | ICD-10-CM | POA: Diagnosis not present

## 2018-03-29 DIAGNOSIS — D6959 Other secondary thrombocytopenia: Secondary | ICD-10-CM | POA: Diagnosis not present

## 2018-03-29 DIAGNOSIS — N186 End stage renal disease: Secondary | ICD-10-CM | POA: Diagnosis not present

## 2018-03-29 DIAGNOSIS — D500 Iron deficiency anemia secondary to blood loss (chronic): Secondary | ICD-10-CM | POA: Diagnosis not present

## 2018-03-29 DIAGNOSIS — N2581 Secondary hyperparathyroidism of renal origin: Secondary | ICD-10-CM | POA: Diagnosis not present

## 2018-03-31 DIAGNOSIS — N186 End stage renal disease: Secondary | ICD-10-CM | POA: Diagnosis not present

## 2018-03-31 DIAGNOSIS — D631 Anemia in chronic kidney disease: Secondary | ICD-10-CM | POA: Diagnosis not present

## 2018-03-31 DIAGNOSIS — N2581 Secondary hyperparathyroidism of renal origin: Secondary | ICD-10-CM | POA: Diagnosis not present

## 2018-03-31 DIAGNOSIS — D500 Iron deficiency anemia secondary to blood loss (chronic): Secondary | ICD-10-CM | POA: Diagnosis not present

## 2018-03-31 DIAGNOSIS — D6959 Other secondary thrombocytopenia: Secondary | ICD-10-CM | POA: Diagnosis not present

## 2018-04-01 DIAGNOSIS — Z992 Dependence on renal dialysis: Secondary | ICD-10-CM | POA: Diagnosis not present

## 2018-04-01 DIAGNOSIS — N186 End stage renal disease: Secondary | ICD-10-CM | POA: Diagnosis not present

## 2018-04-03 DIAGNOSIS — D631 Anemia in chronic kidney disease: Secondary | ICD-10-CM | POA: Diagnosis not present

## 2018-04-03 DIAGNOSIS — N186 End stage renal disease: Secondary | ICD-10-CM | POA: Diagnosis not present

## 2018-04-03 DIAGNOSIS — D500 Iron deficiency anemia secondary to blood loss (chronic): Secondary | ICD-10-CM | POA: Diagnosis not present

## 2018-04-03 DIAGNOSIS — N2581 Secondary hyperparathyroidism of renal origin: Secondary | ICD-10-CM | POA: Diagnosis not present

## 2018-04-03 DIAGNOSIS — E8809 Other disorders of plasma-protein metabolism, not elsewhere classified: Secondary | ICD-10-CM | POA: Diagnosis not present

## 2018-04-05 DIAGNOSIS — E8809 Other disorders of plasma-protein metabolism, not elsewhere classified: Secondary | ICD-10-CM | POA: Diagnosis not present

## 2018-04-05 DIAGNOSIS — N2581 Secondary hyperparathyroidism of renal origin: Secondary | ICD-10-CM | POA: Diagnosis not present

## 2018-04-05 DIAGNOSIS — N186 End stage renal disease: Secondary | ICD-10-CM | POA: Diagnosis not present

## 2018-04-05 DIAGNOSIS — D631 Anemia in chronic kidney disease: Secondary | ICD-10-CM | POA: Diagnosis not present

## 2018-04-05 DIAGNOSIS — D500 Iron deficiency anemia secondary to blood loss (chronic): Secondary | ICD-10-CM | POA: Diagnosis not present

## 2018-04-08 DIAGNOSIS — N2581 Secondary hyperparathyroidism of renal origin: Secondary | ICD-10-CM | POA: Diagnosis not present

## 2018-04-08 DIAGNOSIS — E8809 Other disorders of plasma-protein metabolism, not elsewhere classified: Secondary | ICD-10-CM | POA: Diagnosis not present

## 2018-04-08 DIAGNOSIS — N186 End stage renal disease: Secondary | ICD-10-CM | POA: Diagnosis not present

## 2018-04-08 DIAGNOSIS — D631 Anemia in chronic kidney disease: Secondary | ICD-10-CM | POA: Diagnosis not present

## 2018-04-08 DIAGNOSIS — D500 Iron deficiency anemia secondary to blood loss (chronic): Secondary | ICD-10-CM | POA: Diagnosis not present

## 2018-04-10 DIAGNOSIS — D500 Iron deficiency anemia secondary to blood loss (chronic): Secondary | ICD-10-CM | POA: Diagnosis not present

## 2018-04-10 DIAGNOSIS — Z13220 Encounter for screening for lipoid disorders: Secondary | ICD-10-CM | POA: Diagnosis not present

## 2018-04-10 DIAGNOSIS — N2581 Secondary hyperparathyroidism of renal origin: Secondary | ICD-10-CM | POA: Diagnosis not present

## 2018-04-10 DIAGNOSIS — N186 End stage renal disease: Secondary | ICD-10-CM | POA: Diagnosis not present

## 2018-04-10 DIAGNOSIS — E8809 Other disorders of plasma-protein metabolism, not elsewhere classified: Secondary | ICD-10-CM | POA: Diagnosis not present

## 2018-04-10 DIAGNOSIS — D631 Anemia in chronic kidney disease: Secondary | ICD-10-CM | POA: Diagnosis not present

## 2018-04-12 DIAGNOSIS — N186 End stage renal disease: Secondary | ICD-10-CM | POA: Diagnosis not present

## 2018-04-12 DIAGNOSIS — N2581 Secondary hyperparathyroidism of renal origin: Secondary | ICD-10-CM | POA: Diagnosis not present

## 2018-04-12 DIAGNOSIS — D631 Anemia in chronic kidney disease: Secondary | ICD-10-CM | POA: Diagnosis not present

## 2018-04-12 DIAGNOSIS — D500 Iron deficiency anemia secondary to blood loss (chronic): Secondary | ICD-10-CM | POA: Diagnosis not present

## 2018-04-12 DIAGNOSIS — E8809 Other disorders of plasma-protein metabolism, not elsewhere classified: Secondary | ICD-10-CM | POA: Diagnosis not present

## 2018-04-15 DIAGNOSIS — D631 Anemia in chronic kidney disease: Secondary | ICD-10-CM | POA: Diagnosis not present

## 2018-04-15 DIAGNOSIS — E8809 Other disorders of plasma-protein metabolism, not elsewhere classified: Secondary | ICD-10-CM | POA: Diagnosis not present

## 2018-04-15 DIAGNOSIS — N186 End stage renal disease: Secondary | ICD-10-CM | POA: Diagnosis not present

## 2018-04-15 DIAGNOSIS — N2581 Secondary hyperparathyroidism of renal origin: Secondary | ICD-10-CM | POA: Diagnosis not present

## 2018-04-15 DIAGNOSIS — D500 Iron deficiency anemia secondary to blood loss (chronic): Secondary | ICD-10-CM | POA: Diagnosis not present

## 2018-04-17 DIAGNOSIS — N2581 Secondary hyperparathyroidism of renal origin: Secondary | ICD-10-CM | POA: Diagnosis not present

## 2018-04-17 DIAGNOSIS — E8809 Other disorders of plasma-protein metabolism, not elsewhere classified: Secondary | ICD-10-CM | POA: Diagnosis not present

## 2018-04-17 DIAGNOSIS — D631 Anemia in chronic kidney disease: Secondary | ICD-10-CM | POA: Diagnosis not present

## 2018-04-17 DIAGNOSIS — D500 Iron deficiency anemia secondary to blood loss (chronic): Secondary | ICD-10-CM | POA: Diagnosis not present

## 2018-04-17 DIAGNOSIS — N186 End stage renal disease: Secondary | ICD-10-CM | POA: Diagnosis not present

## 2018-04-19 DIAGNOSIS — N2581 Secondary hyperparathyroidism of renal origin: Secondary | ICD-10-CM | POA: Diagnosis not present

## 2018-04-19 DIAGNOSIS — E8809 Other disorders of plasma-protein metabolism, not elsewhere classified: Secondary | ICD-10-CM | POA: Diagnosis not present

## 2018-04-19 DIAGNOSIS — N186 End stage renal disease: Secondary | ICD-10-CM | POA: Diagnosis not present

## 2018-04-19 DIAGNOSIS — D631 Anemia in chronic kidney disease: Secondary | ICD-10-CM | POA: Diagnosis not present

## 2018-04-19 DIAGNOSIS — D500 Iron deficiency anemia secondary to blood loss (chronic): Secondary | ICD-10-CM | POA: Diagnosis not present

## 2018-04-22 DIAGNOSIS — N186 End stage renal disease: Secondary | ICD-10-CM | POA: Diagnosis not present

## 2018-04-22 DIAGNOSIS — E8809 Other disorders of plasma-protein metabolism, not elsewhere classified: Secondary | ICD-10-CM | POA: Diagnosis not present

## 2018-04-22 DIAGNOSIS — D500 Iron deficiency anemia secondary to blood loss (chronic): Secondary | ICD-10-CM | POA: Diagnosis not present

## 2018-04-22 DIAGNOSIS — D631 Anemia in chronic kidney disease: Secondary | ICD-10-CM | POA: Diagnosis not present

## 2018-04-22 DIAGNOSIS — N2581 Secondary hyperparathyroidism of renal origin: Secondary | ICD-10-CM | POA: Diagnosis not present

## 2018-04-23 DIAGNOSIS — E119 Type 2 diabetes mellitus without complications: Secondary | ICD-10-CM | POA: Diagnosis not present

## 2018-04-23 DIAGNOSIS — I10 Essential (primary) hypertension: Secondary | ICD-10-CM | POA: Diagnosis not present

## 2018-04-24 DIAGNOSIS — N186 End stage renal disease: Secondary | ICD-10-CM | POA: Diagnosis not present

## 2018-04-24 DIAGNOSIS — D500 Iron deficiency anemia secondary to blood loss (chronic): Secondary | ICD-10-CM | POA: Diagnosis not present

## 2018-04-24 DIAGNOSIS — N2581 Secondary hyperparathyroidism of renal origin: Secondary | ICD-10-CM | POA: Diagnosis not present

## 2018-04-24 DIAGNOSIS — E8809 Other disorders of plasma-protein metabolism, not elsewhere classified: Secondary | ICD-10-CM | POA: Diagnosis not present

## 2018-04-24 DIAGNOSIS — D631 Anemia in chronic kidney disease: Secondary | ICD-10-CM | POA: Diagnosis not present

## 2018-04-26 DIAGNOSIS — D500 Iron deficiency anemia secondary to blood loss (chronic): Secondary | ICD-10-CM | POA: Diagnosis not present

## 2018-04-26 DIAGNOSIS — N186 End stage renal disease: Secondary | ICD-10-CM | POA: Diagnosis not present

## 2018-04-26 DIAGNOSIS — N2581 Secondary hyperparathyroidism of renal origin: Secondary | ICD-10-CM | POA: Diagnosis not present

## 2018-04-26 DIAGNOSIS — D631 Anemia in chronic kidney disease: Secondary | ICD-10-CM | POA: Diagnosis not present

## 2018-04-26 DIAGNOSIS — E8809 Other disorders of plasma-protein metabolism, not elsewhere classified: Secondary | ICD-10-CM | POA: Diagnosis not present

## 2018-04-29 DIAGNOSIS — N2581 Secondary hyperparathyroidism of renal origin: Secondary | ICD-10-CM | POA: Diagnosis not present

## 2018-04-29 DIAGNOSIS — D500 Iron deficiency anemia secondary to blood loss (chronic): Secondary | ICD-10-CM | POA: Diagnosis not present

## 2018-04-29 DIAGNOSIS — N186 End stage renal disease: Secondary | ICD-10-CM | POA: Diagnosis not present

## 2018-04-29 DIAGNOSIS — D631 Anemia in chronic kidney disease: Secondary | ICD-10-CM | POA: Diagnosis not present

## 2018-04-29 DIAGNOSIS — E8809 Other disorders of plasma-protein metabolism, not elsewhere classified: Secondary | ICD-10-CM | POA: Diagnosis not present

## 2018-05-01 DIAGNOSIS — D631 Anemia in chronic kidney disease: Secondary | ICD-10-CM | POA: Diagnosis not present

## 2018-05-01 DIAGNOSIS — N186 End stage renal disease: Secondary | ICD-10-CM | POA: Diagnosis not present

## 2018-05-01 DIAGNOSIS — D500 Iron deficiency anemia secondary to blood loss (chronic): Secondary | ICD-10-CM | POA: Diagnosis not present

## 2018-05-01 DIAGNOSIS — N2581 Secondary hyperparathyroidism of renal origin: Secondary | ICD-10-CM | POA: Diagnosis not present

## 2018-05-01 DIAGNOSIS — E8809 Other disorders of plasma-protein metabolism, not elsewhere classified: Secondary | ICD-10-CM | POA: Diagnosis not present

## 2018-05-02 DIAGNOSIS — N186 End stage renal disease: Secondary | ICD-10-CM | POA: Diagnosis not present

## 2018-05-02 DIAGNOSIS — Z992 Dependence on renal dialysis: Secondary | ICD-10-CM | POA: Diagnosis not present

## 2018-05-03 DIAGNOSIS — D631 Anemia in chronic kidney disease: Secondary | ICD-10-CM | POA: Diagnosis not present

## 2018-05-03 DIAGNOSIS — N186 End stage renal disease: Secondary | ICD-10-CM | POA: Diagnosis not present

## 2018-05-03 DIAGNOSIS — E8809 Other disorders of plasma-protein metabolism, not elsewhere classified: Secondary | ICD-10-CM | POA: Diagnosis not present

## 2018-05-03 DIAGNOSIS — N2581 Secondary hyperparathyroidism of renal origin: Secondary | ICD-10-CM | POA: Diagnosis not present

## 2018-05-03 DIAGNOSIS — D500 Iron deficiency anemia secondary to blood loss (chronic): Secondary | ICD-10-CM | POA: Diagnosis not present

## 2018-05-06 DIAGNOSIS — N186 End stage renal disease: Secondary | ICD-10-CM | POA: Diagnosis not present

## 2018-05-06 DIAGNOSIS — N2581 Secondary hyperparathyroidism of renal origin: Secondary | ICD-10-CM | POA: Diagnosis not present

## 2018-05-06 DIAGNOSIS — D631 Anemia in chronic kidney disease: Secondary | ICD-10-CM | POA: Diagnosis not present

## 2018-05-06 DIAGNOSIS — E8809 Other disorders of plasma-protein metabolism, not elsewhere classified: Secondary | ICD-10-CM | POA: Diagnosis not present

## 2018-05-06 DIAGNOSIS — D500 Iron deficiency anemia secondary to blood loss (chronic): Secondary | ICD-10-CM | POA: Diagnosis not present

## 2018-05-08 DIAGNOSIS — D500 Iron deficiency anemia secondary to blood loss (chronic): Secondary | ICD-10-CM | POA: Diagnosis not present

## 2018-05-08 DIAGNOSIS — N186 End stage renal disease: Secondary | ICD-10-CM | POA: Diagnosis not present

## 2018-05-08 DIAGNOSIS — D631 Anemia in chronic kidney disease: Secondary | ICD-10-CM | POA: Diagnosis not present

## 2018-05-08 DIAGNOSIS — N2581 Secondary hyperparathyroidism of renal origin: Secondary | ICD-10-CM | POA: Diagnosis not present

## 2018-05-08 DIAGNOSIS — E8809 Other disorders of plasma-protein metabolism, not elsewhere classified: Secondary | ICD-10-CM | POA: Diagnosis not present

## 2018-05-10 DIAGNOSIS — E8809 Other disorders of plasma-protein metabolism, not elsewhere classified: Secondary | ICD-10-CM | POA: Diagnosis not present

## 2018-05-10 DIAGNOSIS — N2581 Secondary hyperparathyroidism of renal origin: Secondary | ICD-10-CM | POA: Diagnosis not present

## 2018-05-10 DIAGNOSIS — D631 Anemia in chronic kidney disease: Secondary | ICD-10-CM | POA: Diagnosis not present

## 2018-05-10 DIAGNOSIS — N186 End stage renal disease: Secondary | ICD-10-CM | POA: Diagnosis not present

## 2018-05-10 DIAGNOSIS — D500 Iron deficiency anemia secondary to blood loss (chronic): Secondary | ICD-10-CM | POA: Diagnosis not present

## 2018-05-13 DIAGNOSIS — N2581 Secondary hyperparathyroidism of renal origin: Secondary | ICD-10-CM | POA: Diagnosis not present

## 2018-05-13 DIAGNOSIS — D631 Anemia in chronic kidney disease: Secondary | ICD-10-CM | POA: Diagnosis not present

## 2018-05-13 DIAGNOSIS — N186 End stage renal disease: Secondary | ICD-10-CM | POA: Diagnosis not present

## 2018-05-13 DIAGNOSIS — E8809 Other disorders of plasma-protein metabolism, not elsewhere classified: Secondary | ICD-10-CM | POA: Diagnosis not present

## 2018-05-13 DIAGNOSIS — D500 Iron deficiency anemia secondary to blood loss (chronic): Secondary | ICD-10-CM | POA: Diagnosis not present

## 2018-05-15 DIAGNOSIS — N186 End stage renal disease: Secondary | ICD-10-CM | POA: Diagnosis not present

## 2018-05-15 DIAGNOSIS — D500 Iron deficiency anemia secondary to blood loss (chronic): Secondary | ICD-10-CM | POA: Diagnosis not present

## 2018-05-15 DIAGNOSIS — N2581 Secondary hyperparathyroidism of renal origin: Secondary | ICD-10-CM | POA: Diagnosis not present

## 2018-05-15 DIAGNOSIS — E8809 Other disorders of plasma-protein metabolism, not elsewhere classified: Secondary | ICD-10-CM | POA: Diagnosis not present

## 2018-05-15 DIAGNOSIS — D631 Anemia in chronic kidney disease: Secondary | ICD-10-CM | POA: Diagnosis not present

## 2018-05-17 DIAGNOSIS — N2581 Secondary hyperparathyroidism of renal origin: Secondary | ICD-10-CM | POA: Diagnosis not present

## 2018-05-17 DIAGNOSIS — D500 Iron deficiency anemia secondary to blood loss (chronic): Secondary | ICD-10-CM | POA: Diagnosis not present

## 2018-05-17 DIAGNOSIS — D631 Anemia in chronic kidney disease: Secondary | ICD-10-CM | POA: Diagnosis not present

## 2018-05-17 DIAGNOSIS — N186 End stage renal disease: Secondary | ICD-10-CM | POA: Diagnosis not present

## 2018-05-17 DIAGNOSIS — E8809 Other disorders of plasma-protein metabolism, not elsewhere classified: Secondary | ICD-10-CM | POA: Diagnosis not present

## 2018-05-20 DIAGNOSIS — E8809 Other disorders of plasma-protein metabolism, not elsewhere classified: Secondary | ICD-10-CM | POA: Diagnosis not present

## 2018-05-20 DIAGNOSIS — N186 End stage renal disease: Secondary | ICD-10-CM | POA: Diagnosis not present

## 2018-05-20 DIAGNOSIS — D631 Anemia in chronic kidney disease: Secondary | ICD-10-CM | POA: Diagnosis not present

## 2018-05-20 DIAGNOSIS — D500 Iron deficiency anemia secondary to blood loss (chronic): Secondary | ICD-10-CM | POA: Diagnosis not present

## 2018-05-20 DIAGNOSIS — N2581 Secondary hyperparathyroidism of renal origin: Secondary | ICD-10-CM | POA: Diagnosis not present

## 2018-05-22 DIAGNOSIS — D631 Anemia in chronic kidney disease: Secondary | ICD-10-CM | POA: Diagnosis not present

## 2018-05-22 DIAGNOSIS — E8809 Other disorders of plasma-protein metabolism, not elsewhere classified: Secondary | ICD-10-CM | POA: Diagnosis not present

## 2018-05-22 DIAGNOSIS — D500 Iron deficiency anemia secondary to blood loss (chronic): Secondary | ICD-10-CM | POA: Diagnosis not present

## 2018-05-22 DIAGNOSIS — E119 Type 2 diabetes mellitus without complications: Secondary | ICD-10-CM | POA: Diagnosis not present

## 2018-05-22 DIAGNOSIS — I10 Essential (primary) hypertension: Secondary | ICD-10-CM | POA: Diagnosis not present

## 2018-05-22 DIAGNOSIS — N186 End stage renal disease: Secondary | ICD-10-CM | POA: Diagnosis not present

## 2018-05-22 DIAGNOSIS — N2581 Secondary hyperparathyroidism of renal origin: Secondary | ICD-10-CM | POA: Diagnosis not present

## 2018-05-24 DIAGNOSIS — E8809 Other disorders of plasma-protein metabolism, not elsewhere classified: Secondary | ICD-10-CM | POA: Diagnosis not present

## 2018-05-24 DIAGNOSIS — N2581 Secondary hyperparathyroidism of renal origin: Secondary | ICD-10-CM | POA: Diagnosis not present

## 2018-05-24 DIAGNOSIS — N186 End stage renal disease: Secondary | ICD-10-CM | POA: Diagnosis not present

## 2018-05-24 DIAGNOSIS — D631 Anemia in chronic kidney disease: Secondary | ICD-10-CM | POA: Diagnosis not present

## 2018-05-24 DIAGNOSIS — D500 Iron deficiency anemia secondary to blood loss (chronic): Secondary | ICD-10-CM | POA: Diagnosis not present

## 2018-05-27 DIAGNOSIS — D500 Iron deficiency anemia secondary to blood loss (chronic): Secondary | ICD-10-CM | POA: Diagnosis not present

## 2018-05-27 DIAGNOSIS — N2581 Secondary hyperparathyroidism of renal origin: Secondary | ICD-10-CM | POA: Diagnosis not present

## 2018-05-27 DIAGNOSIS — D631 Anemia in chronic kidney disease: Secondary | ICD-10-CM | POA: Diagnosis not present

## 2018-05-27 DIAGNOSIS — N186 End stage renal disease: Secondary | ICD-10-CM | POA: Diagnosis not present

## 2018-05-27 DIAGNOSIS — E8809 Other disorders of plasma-protein metabolism, not elsewhere classified: Secondary | ICD-10-CM | POA: Diagnosis not present

## 2018-05-29 DIAGNOSIS — N2581 Secondary hyperparathyroidism of renal origin: Secondary | ICD-10-CM | POA: Diagnosis not present

## 2018-05-29 DIAGNOSIS — D500 Iron deficiency anemia secondary to blood loss (chronic): Secondary | ICD-10-CM | POA: Diagnosis not present

## 2018-05-29 DIAGNOSIS — D631 Anemia in chronic kidney disease: Secondary | ICD-10-CM | POA: Diagnosis not present

## 2018-05-29 DIAGNOSIS — E8809 Other disorders of plasma-protein metabolism, not elsewhere classified: Secondary | ICD-10-CM | POA: Diagnosis not present

## 2018-05-29 DIAGNOSIS — N186 End stage renal disease: Secondary | ICD-10-CM | POA: Diagnosis not present

## 2018-05-31 DIAGNOSIS — N2581 Secondary hyperparathyroidism of renal origin: Secondary | ICD-10-CM | POA: Diagnosis not present

## 2018-05-31 DIAGNOSIS — D500 Iron deficiency anemia secondary to blood loss (chronic): Secondary | ICD-10-CM | POA: Diagnosis not present

## 2018-05-31 DIAGNOSIS — N186 End stage renal disease: Secondary | ICD-10-CM | POA: Diagnosis not present

## 2018-05-31 DIAGNOSIS — E8809 Other disorders of plasma-protein metabolism, not elsewhere classified: Secondary | ICD-10-CM | POA: Diagnosis not present

## 2018-05-31 DIAGNOSIS — Z992 Dependence on renal dialysis: Secondary | ICD-10-CM | POA: Diagnosis not present

## 2018-05-31 DIAGNOSIS — D631 Anemia in chronic kidney disease: Secondary | ICD-10-CM | POA: Diagnosis not present

## 2018-06-03 DIAGNOSIS — N186 End stage renal disease: Secondary | ICD-10-CM | POA: Diagnosis not present

## 2018-06-03 DIAGNOSIS — E1129 Type 2 diabetes mellitus with other diabetic kidney complication: Secondary | ICD-10-CM | POA: Diagnosis not present

## 2018-06-03 DIAGNOSIS — D500 Iron deficiency anemia secondary to blood loss (chronic): Secondary | ICD-10-CM | POA: Diagnosis not present

## 2018-06-03 DIAGNOSIS — D6959 Other secondary thrombocytopenia: Secondary | ICD-10-CM | POA: Diagnosis not present

## 2018-06-03 DIAGNOSIS — E8809 Other disorders of plasma-protein metabolism, not elsewhere classified: Secondary | ICD-10-CM | POA: Diagnosis not present

## 2018-06-03 DIAGNOSIS — D631 Anemia in chronic kidney disease: Secondary | ICD-10-CM | POA: Diagnosis not present

## 2018-06-03 DIAGNOSIS — N2581 Secondary hyperparathyroidism of renal origin: Secondary | ICD-10-CM | POA: Diagnosis not present

## 2018-06-05 DIAGNOSIS — N2581 Secondary hyperparathyroidism of renal origin: Secondary | ICD-10-CM | POA: Diagnosis not present

## 2018-06-05 DIAGNOSIS — D631 Anemia in chronic kidney disease: Secondary | ICD-10-CM | POA: Diagnosis not present

## 2018-06-05 DIAGNOSIS — N186 End stage renal disease: Secondary | ICD-10-CM | POA: Diagnosis not present

## 2018-06-05 DIAGNOSIS — E8809 Other disorders of plasma-protein metabolism, not elsewhere classified: Secondary | ICD-10-CM | POA: Diagnosis not present

## 2018-06-05 DIAGNOSIS — D500 Iron deficiency anemia secondary to blood loss (chronic): Secondary | ICD-10-CM | POA: Diagnosis not present

## 2018-06-07 DIAGNOSIS — E8809 Other disorders of plasma-protein metabolism, not elsewhere classified: Secondary | ICD-10-CM | POA: Diagnosis not present

## 2018-06-07 DIAGNOSIS — D500 Iron deficiency anemia secondary to blood loss (chronic): Secondary | ICD-10-CM | POA: Diagnosis not present

## 2018-06-07 DIAGNOSIS — D631 Anemia in chronic kidney disease: Secondary | ICD-10-CM | POA: Diagnosis not present

## 2018-06-07 DIAGNOSIS — N2581 Secondary hyperparathyroidism of renal origin: Secondary | ICD-10-CM | POA: Diagnosis not present

## 2018-06-07 DIAGNOSIS — N186 End stage renal disease: Secondary | ICD-10-CM | POA: Diagnosis not present

## 2018-06-10 DIAGNOSIS — N2581 Secondary hyperparathyroidism of renal origin: Secondary | ICD-10-CM | POA: Diagnosis not present

## 2018-06-10 DIAGNOSIS — E8809 Other disorders of plasma-protein metabolism, not elsewhere classified: Secondary | ICD-10-CM | POA: Diagnosis not present

## 2018-06-10 DIAGNOSIS — D500 Iron deficiency anemia secondary to blood loss (chronic): Secondary | ICD-10-CM | POA: Diagnosis not present

## 2018-06-10 DIAGNOSIS — N186 End stage renal disease: Secondary | ICD-10-CM | POA: Diagnosis not present

## 2018-06-10 DIAGNOSIS — D631 Anemia in chronic kidney disease: Secondary | ICD-10-CM | POA: Diagnosis not present

## 2018-06-12 DIAGNOSIS — N186 End stage renal disease: Secondary | ICD-10-CM | POA: Diagnosis not present

## 2018-06-12 DIAGNOSIS — N2581 Secondary hyperparathyroidism of renal origin: Secondary | ICD-10-CM | POA: Diagnosis not present

## 2018-06-12 DIAGNOSIS — E8809 Other disorders of plasma-protein metabolism, not elsewhere classified: Secondary | ICD-10-CM | POA: Diagnosis not present

## 2018-06-12 DIAGNOSIS — E1129 Type 2 diabetes mellitus with other diabetic kidney complication: Secondary | ICD-10-CM | POA: Diagnosis not present

## 2018-06-12 DIAGNOSIS — D631 Anemia in chronic kidney disease: Secondary | ICD-10-CM | POA: Diagnosis not present

## 2018-06-12 DIAGNOSIS — D500 Iron deficiency anemia secondary to blood loss (chronic): Secondary | ICD-10-CM | POA: Diagnosis not present

## 2018-06-14 DIAGNOSIS — D631 Anemia in chronic kidney disease: Secondary | ICD-10-CM | POA: Diagnosis not present

## 2018-06-14 DIAGNOSIS — E8809 Other disorders of plasma-protein metabolism, not elsewhere classified: Secondary | ICD-10-CM | POA: Diagnosis not present

## 2018-06-14 DIAGNOSIS — N2581 Secondary hyperparathyroidism of renal origin: Secondary | ICD-10-CM | POA: Diagnosis not present

## 2018-06-14 DIAGNOSIS — N186 End stage renal disease: Secondary | ICD-10-CM | POA: Diagnosis not present

## 2018-06-14 DIAGNOSIS — D500 Iron deficiency anemia secondary to blood loss (chronic): Secondary | ICD-10-CM | POA: Diagnosis not present

## 2018-06-17 DIAGNOSIS — D500 Iron deficiency anemia secondary to blood loss (chronic): Secondary | ICD-10-CM | POA: Diagnosis not present

## 2018-06-17 DIAGNOSIS — I10 Essential (primary) hypertension: Secondary | ICD-10-CM | POA: Diagnosis not present

## 2018-06-17 DIAGNOSIS — E8809 Other disorders of plasma-protein metabolism, not elsewhere classified: Secondary | ICD-10-CM | POA: Diagnosis not present

## 2018-06-17 DIAGNOSIS — D631 Anemia in chronic kidney disease: Secondary | ICD-10-CM | POA: Diagnosis not present

## 2018-06-17 DIAGNOSIS — N2581 Secondary hyperparathyroidism of renal origin: Secondary | ICD-10-CM | POA: Diagnosis not present

## 2018-06-17 DIAGNOSIS — N186 End stage renal disease: Secondary | ICD-10-CM | POA: Diagnosis not present

## 2018-06-17 DIAGNOSIS — E119 Type 2 diabetes mellitus without complications: Secondary | ICD-10-CM | POA: Diagnosis not present

## 2018-06-18 DIAGNOSIS — F17210 Nicotine dependence, cigarettes, uncomplicated: Secondary | ICD-10-CM | POA: Diagnosis not present

## 2018-06-18 DIAGNOSIS — Y92410 Unspecified street and highway as the place of occurrence of the external cause: Secondary | ICD-10-CM | POA: Diagnosis not present

## 2018-06-18 DIAGNOSIS — S7001XA Contusion of right hip, initial encounter: Secondary | ICD-10-CM | POA: Diagnosis not present

## 2018-06-18 DIAGNOSIS — Z794 Long term (current) use of insulin: Secondary | ICD-10-CM | POA: Diagnosis not present

## 2018-06-18 DIAGNOSIS — I10 Essential (primary) hypertension: Secondary | ICD-10-CM | POA: Diagnosis not present

## 2018-06-18 DIAGNOSIS — E119 Type 2 diabetes mellitus without complications: Secondary | ICD-10-CM | POA: Diagnosis not present

## 2018-06-19 DIAGNOSIS — N186 End stage renal disease: Secondary | ICD-10-CM | POA: Diagnosis not present

## 2018-06-19 DIAGNOSIS — N2581 Secondary hyperparathyroidism of renal origin: Secondary | ICD-10-CM | POA: Diagnosis not present

## 2018-06-19 DIAGNOSIS — D631 Anemia in chronic kidney disease: Secondary | ICD-10-CM | POA: Diagnosis not present

## 2018-06-19 DIAGNOSIS — E8809 Other disorders of plasma-protein metabolism, not elsewhere classified: Secondary | ICD-10-CM | POA: Diagnosis not present

## 2018-06-19 DIAGNOSIS — D500 Iron deficiency anemia secondary to blood loss (chronic): Secondary | ICD-10-CM | POA: Diagnosis not present

## 2018-06-21 DIAGNOSIS — N186 End stage renal disease: Secondary | ICD-10-CM | POA: Diagnosis not present

## 2018-06-21 DIAGNOSIS — D631 Anemia in chronic kidney disease: Secondary | ICD-10-CM | POA: Diagnosis not present

## 2018-06-21 DIAGNOSIS — N2581 Secondary hyperparathyroidism of renal origin: Secondary | ICD-10-CM | POA: Diagnosis not present

## 2018-06-21 DIAGNOSIS — D500 Iron deficiency anemia secondary to blood loss (chronic): Secondary | ICD-10-CM | POA: Diagnosis not present

## 2018-06-21 DIAGNOSIS — E8809 Other disorders of plasma-protein metabolism, not elsewhere classified: Secondary | ICD-10-CM | POA: Diagnosis not present

## 2018-06-24 DIAGNOSIS — N186 End stage renal disease: Secondary | ICD-10-CM | POA: Diagnosis not present

## 2018-06-24 DIAGNOSIS — N2581 Secondary hyperparathyroidism of renal origin: Secondary | ICD-10-CM | POA: Diagnosis not present

## 2018-06-24 DIAGNOSIS — D500 Iron deficiency anemia secondary to blood loss (chronic): Secondary | ICD-10-CM | POA: Diagnosis not present

## 2018-06-24 DIAGNOSIS — E8809 Other disorders of plasma-protein metabolism, not elsewhere classified: Secondary | ICD-10-CM | POA: Diagnosis not present

## 2018-06-24 DIAGNOSIS — D631 Anemia in chronic kidney disease: Secondary | ICD-10-CM | POA: Diagnosis not present

## 2018-06-26 DIAGNOSIS — D500 Iron deficiency anemia secondary to blood loss (chronic): Secondary | ICD-10-CM | POA: Diagnosis not present

## 2018-06-26 DIAGNOSIS — N2581 Secondary hyperparathyroidism of renal origin: Secondary | ICD-10-CM | POA: Diagnosis not present

## 2018-06-26 DIAGNOSIS — N186 End stage renal disease: Secondary | ICD-10-CM | POA: Diagnosis not present

## 2018-06-26 DIAGNOSIS — D631 Anemia in chronic kidney disease: Secondary | ICD-10-CM | POA: Diagnosis not present

## 2018-06-26 DIAGNOSIS — E8809 Other disorders of plasma-protein metabolism, not elsewhere classified: Secondary | ICD-10-CM | POA: Diagnosis not present

## 2018-06-28 DIAGNOSIS — N186 End stage renal disease: Secondary | ICD-10-CM | POA: Diagnosis not present

## 2018-06-28 DIAGNOSIS — D500 Iron deficiency anemia secondary to blood loss (chronic): Secondary | ICD-10-CM | POA: Diagnosis not present

## 2018-06-28 DIAGNOSIS — E8809 Other disorders of plasma-protein metabolism, not elsewhere classified: Secondary | ICD-10-CM | POA: Diagnosis not present

## 2018-06-28 DIAGNOSIS — D631 Anemia in chronic kidney disease: Secondary | ICD-10-CM | POA: Diagnosis not present

## 2018-06-28 DIAGNOSIS — N2581 Secondary hyperparathyroidism of renal origin: Secondary | ICD-10-CM | POA: Diagnosis not present

## 2018-07-01 DIAGNOSIS — D631 Anemia in chronic kidney disease: Secondary | ICD-10-CM | POA: Diagnosis not present

## 2018-07-01 DIAGNOSIS — N186 End stage renal disease: Secondary | ICD-10-CM | POA: Diagnosis not present

## 2018-07-01 DIAGNOSIS — D500 Iron deficiency anemia secondary to blood loss (chronic): Secondary | ICD-10-CM | POA: Diagnosis not present

## 2018-07-01 DIAGNOSIS — E8809 Other disorders of plasma-protein metabolism, not elsewhere classified: Secondary | ICD-10-CM | POA: Diagnosis not present

## 2018-07-01 DIAGNOSIS — N2581 Secondary hyperparathyroidism of renal origin: Secondary | ICD-10-CM | POA: Diagnosis not present

## 2018-07-01 DIAGNOSIS — Z992 Dependence on renal dialysis: Secondary | ICD-10-CM | POA: Diagnosis not present

## 2018-07-03 DIAGNOSIS — N186 End stage renal disease: Secondary | ICD-10-CM | POA: Diagnosis not present

## 2018-07-03 DIAGNOSIS — D631 Anemia in chronic kidney disease: Secondary | ICD-10-CM | POA: Diagnosis not present

## 2018-07-03 DIAGNOSIS — N2581 Secondary hyperparathyroidism of renal origin: Secondary | ICD-10-CM | POA: Diagnosis not present

## 2018-07-03 DIAGNOSIS — E8809 Other disorders of plasma-protein metabolism, not elsewhere classified: Secondary | ICD-10-CM | POA: Diagnosis not present

## 2018-07-03 DIAGNOSIS — D500 Iron deficiency anemia secondary to blood loss (chronic): Secondary | ICD-10-CM | POA: Diagnosis not present

## 2018-07-05 DIAGNOSIS — E8809 Other disorders of plasma-protein metabolism, not elsewhere classified: Secondary | ICD-10-CM | POA: Diagnosis not present

## 2018-07-05 DIAGNOSIS — D631 Anemia in chronic kidney disease: Secondary | ICD-10-CM | POA: Diagnosis not present

## 2018-07-05 DIAGNOSIS — N186 End stage renal disease: Secondary | ICD-10-CM | POA: Diagnosis not present

## 2018-07-05 DIAGNOSIS — D500 Iron deficiency anemia secondary to blood loss (chronic): Secondary | ICD-10-CM | POA: Diagnosis not present

## 2018-07-05 DIAGNOSIS — N2581 Secondary hyperparathyroidism of renal origin: Secondary | ICD-10-CM | POA: Diagnosis not present

## 2018-07-08 DIAGNOSIS — D631 Anemia in chronic kidney disease: Secondary | ICD-10-CM | POA: Diagnosis not present

## 2018-07-08 DIAGNOSIS — N2581 Secondary hyperparathyroidism of renal origin: Secondary | ICD-10-CM | POA: Diagnosis not present

## 2018-07-08 DIAGNOSIS — E8809 Other disorders of plasma-protein metabolism, not elsewhere classified: Secondary | ICD-10-CM | POA: Diagnosis not present

## 2018-07-08 DIAGNOSIS — N186 End stage renal disease: Secondary | ICD-10-CM | POA: Diagnosis not present

## 2018-07-08 DIAGNOSIS — D500 Iron deficiency anemia secondary to blood loss (chronic): Secondary | ICD-10-CM | POA: Diagnosis not present

## 2018-07-10 DIAGNOSIS — E8809 Other disorders of plasma-protein metabolism, not elsewhere classified: Secondary | ICD-10-CM | POA: Diagnosis not present

## 2018-07-10 DIAGNOSIS — D500 Iron deficiency anemia secondary to blood loss (chronic): Secondary | ICD-10-CM | POA: Diagnosis not present

## 2018-07-10 DIAGNOSIS — D631 Anemia in chronic kidney disease: Secondary | ICD-10-CM | POA: Diagnosis not present

## 2018-07-10 DIAGNOSIS — N2581 Secondary hyperparathyroidism of renal origin: Secondary | ICD-10-CM | POA: Diagnosis not present

## 2018-07-10 DIAGNOSIS — N186 End stage renal disease: Secondary | ICD-10-CM | POA: Diagnosis not present

## 2018-07-12 DIAGNOSIS — N2581 Secondary hyperparathyroidism of renal origin: Secondary | ICD-10-CM | POA: Diagnosis not present

## 2018-07-12 DIAGNOSIS — N186 End stage renal disease: Secondary | ICD-10-CM | POA: Diagnosis not present

## 2018-07-12 DIAGNOSIS — D500 Iron deficiency anemia secondary to blood loss (chronic): Secondary | ICD-10-CM | POA: Diagnosis not present

## 2018-07-12 DIAGNOSIS — D631 Anemia in chronic kidney disease: Secondary | ICD-10-CM | POA: Diagnosis not present

## 2018-07-12 DIAGNOSIS — E8809 Other disorders of plasma-protein metabolism, not elsewhere classified: Secondary | ICD-10-CM | POA: Diagnosis not present

## 2018-07-15 DIAGNOSIS — N186 End stage renal disease: Secondary | ICD-10-CM | POA: Diagnosis not present

## 2018-07-15 DIAGNOSIS — N2581 Secondary hyperparathyroidism of renal origin: Secondary | ICD-10-CM | POA: Diagnosis not present

## 2018-07-15 DIAGNOSIS — D631 Anemia in chronic kidney disease: Secondary | ICD-10-CM | POA: Diagnosis not present

## 2018-07-15 DIAGNOSIS — D500 Iron deficiency anemia secondary to blood loss (chronic): Secondary | ICD-10-CM | POA: Diagnosis not present

## 2018-07-15 DIAGNOSIS — E8809 Other disorders of plasma-protein metabolism, not elsewhere classified: Secondary | ICD-10-CM | POA: Diagnosis not present

## 2018-07-17 DIAGNOSIS — D500 Iron deficiency anemia secondary to blood loss (chronic): Secondary | ICD-10-CM | POA: Diagnosis not present

## 2018-07-17 DIAGNOSIS — E8809 Other disorders of plasma-protein metabolism, not elsewhere classified: Secondary | ICD-10-CM | POA: Diagnosis not present

## 2018-07-17 DIAGNOSIS — D631 Anemia in chronic kidney disease: Secondary | ICD-10-CM | POA: Diagnosis not present

## 2018-07-17 DIAGNOSIS — N2581 Secondary hyperparathyroidism of renal origin: Secondary | ICD-10-CM | POA: Diagnosis not present

## 2018-07-17 DIAGNOSIS — N186 End stage renal disease: Secondary | ICD-10-CM | POA: Diagnosis not present

## 2018-07-19 DIAGNOSIS — N2581 Secondary hyperparathyroidism of renal origin: Secondary | ICD-10-CM | POA: Diagnosis not present

## 2018-07-19 DIAGNOSIS — E8809 Other disorders of plasma-protein metabolism, not elsewhere classified: Secondary | ICD-10-CM | POA: Diagnosis not present

## 2018-07-19 DIAGNOSIS — N186 End stage renal disease: Secondary | ICD-10-CM | POA: Diagnosis not present

## 2018-07-19 DIAGNOSIS — D631 Anemia in chronic kidney disease: Secondary | ICD-10-CM | POA: Diagnosis not present

## 2018-07-19 DIAGNOSIS — D500 Iron deficiency anemia secondary to blood loss (chronic): Secondary | ICD-10-CM | POA: Diagnosis not present

## 2018-07-21 DIAGNOSIS — E119 Type 2 diabetes mellitus without complications: Secondary | ICD-10-CM | POA: Diagnosis not present

## 2018-07-21 DIAGNOSIS — I10 Essential (primary) hypertension: Secondary | ICD-10-CM | POA: Diagnosis not present

## 2018-07-22 DIAGNOSIS — D500 Iron deficiency anemia secondary to blood loss (chronic): Secondary | ICD-10-CM | POA: Diagnosis not present

## 2018-07-22 DIAGNOSIS — D631 Anemia in chronic kidney disease: Secondary | ICD-10-CM | POA: Diagnosis not present

## 2018-07-22 DIAGNOSIS — N2581 Secondary hyperparathyroidism of renal origin: Secondary | ICD-10-CM | POA: Diagnosis not present

## 2018-07-22 DIAGNOSIS — N186 End stage renal disease: Secondary | ICD-10-CM | POA: Diagnosis not present

## 2018-07-22 DIAGNOSIS — E8809 Other disorders of plasma-protein metabolism, not elsewhere classified: Secondary | ICD-10-CM | POA: Diagnosis not present

## 2018-07-24 DIAGNOSIS — N186 End stage renal disease: Secondary | ICD-10-CM | POA: Diagnosis not present

## 2018-07-24 DIAGNOSIS — N2581 Secondary hyperparathyroidism of renal origin: Secondary | ICD-10-CM | POA: Diagnosis not present

## 2018-07-24 DIAGNOSIS — E8809 Other disorders of plasma-protein metabolism, not elsewhere classified: Secondary | ICD-10-CM | POA: Diagnosis not present

## 2018-07-24 DIAGNOSIS — D500 Iron deficiency anemia secondary to blood loss (chronic): Secondary | ICD-10-CM | POA: Diagnosis not present

## 2018-07-24 DIAGNOSIS — D631 Anemia in chronic kidney disease: Secondary | ICD-10-CM | POA: Diagnosis not present

## 2018-07-26 DIAGNOSIS — E8809 Other disorders of plasma-protein metabolism, not elsewhere classified: Secondary | ICD-10-CM | POA: Diagnosis not present

## 2018-07-26 DIAGNOSIS — N186 End stage renal disease: Secondary | ICD-10-CM | POA: Diagnosis not present

## 2018-07-26 DIAGNOSIS — D500 Iron deficiency anemia secondary to blood loss (chronic): Secondary | ICD-10-CM | POA: Diagnosis not present

## 2018-07-26 DIAGNOSIS — N2581 Secondary hyperparathyroidism of renal origin: Secondary | ICD-10-CM | POA: Diagnosis not present

## 2018-07-26 DIAGNOSIS — D631 Anemia in chronic kidney disease: Secondary | ICD-10-CM | POA: Diagnosis not present

## 2018-07-29 DIAGNOSIS — N186 End stage renal disease: Secondary | ICD-10-CM | POA: Diagnosis not present

## 2018-07-29 DIAGNOSIS — D500 Iron deficiency anemia secondary to blood loss (chronic): Secondary | ICD-10-CM | POA: Diagnosis not present

## 2018-07-29 DIAGNOSIS — D631 Anemia in chronic kidney disease: Secondary | ICD-10-CM | POA: Diagnosis not present

## 2018-07-29 DIAGNOSIS — E8809 Other disorders of plasma-protein metabolism, not elsewhere classified: Secondary | ICD-10-CM | POA: Diagnosis not present

## 2018-07-29 DIAGNOSIS — N2581 Secondary hyperparathyroidism of renal origin: Secondary | ICD-10-CM | POA: Diagnosis not present

## 2018-07-31 DIAGNOSIS — Z992 Dependence on renal dialysis: Secondary | ICD-10-CM | POA: Diagnosis not present

## 2018-07-31 DIAGNOSIS — E8809 Other disorders of plasma-protein metabolism, not elsewhere classified: Secondary | ICD-10-CM | POA: Diagnosis not present

## 2018-07-31 DIAGNOSIS — D631 Anemia in chronic kidney disease: Secondary | ICD-10-CM | POA: Diagnosis not present

## 2018-07-31 DIAGNOSIS — D500 Iron deficiency anemia secondary to blood loss (chronic): Secondary | ICD-10-CM | POA: Diagnosis not present

## 2018-07-31 DIAGNOSIS — N2581 Secondary hyperparathyroidism of renal origin: Secondary | ICD-10-CM | POA: Diagnosis not present

## 2018-07-31 DIAGNOSIS — N186 End stage renal disease: Secondary | ICD-10-CM | POA: Diagnosis not present

## 2018-08-02 DIAGNOSIS — D631 Anemia in chronic kidney disease: Secondary | ICD-10-CM | POA: Diagnosis not present

## 2018-08-02 DIAGNOSIS — E8809 Other disorders of plasma-protein metabolism, not elsewhere classified: Secondary | ICD-10-CM | POA: Diagnosis not present

## 2018-08-02 DIAGNOSIS — N2581 Secondary hyperparathyroidism of renal origin: Secondary | ICD-10-CM | POA: Diagnosis not present

## 2018-08-02 DIAGNOSIS — N186 End stage renal disease: Secondary | ICD-10-CM | POA: Diagnosis not present

## 2018-08-02 DIAGNOSIS — D500 Iron deficiency anemia secondary to blood loss (chronic): Secondary | ICD-10-CM | POA: Diagnosis not present

## 2018-08-05 DIAGNOSIS — N186 End stage renal disease: Secondary | ICD-10-CM | POA: Diagnosis not present

## 2018-08-05 DIAGNOSIS — E8809 Other disorders of plasma-protein metabolism, not elsewhere classified: Secondary | ICD-10-CM | POA: Diagnosis not present

## 2018-08-05 DIAGNOSIS — D500 Iron deficiency anemia secondary to blood loss (chronic): Secondary | ICD-10-CM | POA: Diagnosis not present

## 2018-08-05 DIAGNOSIS — N2581 Secondary hyperparathyroidism of renal origin: Secondary | ICD-10-CM | POA: Diagnosis not present

## 2018-08-05 DIAGNOSIS — D631 Anemia in chronic kidney disease: Secondary | ICD-10-CM | POA: Diagnosis not present

## 2018-08-07 DIAGNOSIS — N186 End stage renal disease: Secondary | ICD-10-CM | POA: Diagnosis not present

## 2018-08-07 DIAGNOSIS — D631 Anemia in chronic kidney disease: Secondary | ICD-10-CM | POA: Diagnosis not present

## 2018-08-07 DIAGNOSIS — N2581 Secondary hyperparathyroidism of renal origin: Secondary | ICD-10-CM | POA: Diagnosis not present

## 2018-08-07 DIAGNOSIS — E8809 Other disorders of plasma-protein metabolism, not elsewhere classified: Secondary | ICD-10-CM | POA: Diagnosis not present

## 2018-08-07 DIAGNOSIS — D500 Iron deficiency anemia secondary to blood loss (chronic): Secondary | ICD-10-CM | POA: Diagnosis not present

## 2018-08-09 DIAGNOSIS — N186 End stage renal disease: Secondary | ICD-10-CM | POA: Diagnosis not present

## 2018-08-09 DIAGNOSIS — D631 Anemia in chronic kidney disease: Secondary | ICD-10-CM | POA: Diagnosis not present

## 2018-08-09 DIAGNOSIS — E8809 Other disorders of plasma-protein metabolism, not elsewhere classified: Secondary | ICD-10-CM | POA: Diagnosis not present

## 2018-08-09 DIAGNOSIS — D500 Iron deficiency anemia secondary to blood loss (chronic): Secondary | ICD-10-CM | POA: Diagnosis not present

## 2018-08-09 DIAGNOSIS — N2581 Secondary hyperparathyroidism of renal origin: Secondary | ICD-10-CM | POA: Diagnosis not present

## 2018-08-12 DIAGNOSIS — E8809 Other disorders of plasma-protein metabolism, not elsewhere classified: Secondary | ICD-10-CM | POA: Diagnosis not present

## 2018-08-12 DIAGNOSIS — N2581 Secondary hyperparathyroidism of renal origin: Secondary | ICD-10-CM | POA: Diagnosis not present

## 2018-08-12 DIAGNOSIS — D631 Anemia in chronic kidney disease: Secondary | ICD-10-CM | POA: Diagnosis not present

## 2018-08-12 DIAGNOSIS — D500 Iron deficiency anemia secondary to blood loss (chronic): Secondary | ICD-10-CM | POA: Diagnosis not present

## 2018-08-12 DIAGNOSIS — N186 End stage renal disease: Secondary | ICD-10-CM | POA: Diagnosis not present

## 2018-08-14 DIAGNOSIS — D631 Anemia in chronic kidney disease: Secondary | ICD-10-CM | POA: Diagnosis not present

## 2018-08-14 DIAGNOSIS — E8809 Other disorders of plasma-protein metabolism, not elsewhere classified: Secondary | ICD-10-CM | POA: Diagnosis not present

## 2018-08-14 DIAGNOSIS — N186 End stage renal disease: Secondary | ICD-10-CM | POA: Diagnosis not present

## 2018-08-14 DIAGNOSIS — N2581 Secondary hyperparathyroidism of renal origin: Secondary | ICD-10-CM | POA: Diagnosis not present

## 2018-08-14 DIAGNOSIS — D500 Iron deficiency anemia secondary to blood loss (chronic): Secondary | ICD-10-CM | POA: Diagnosis not present

## 2018-08-16 DIAGNOSIS — N2581 Secondary hyperparathyroidism of renal origin: Secondary | ICD-10-CM | POA: Diagnosis not present

## 2018-08-16 DIAGNOSIS — D631 Anemia in chronic kidney disease: Secondary | ICD-10-CM | POA: Diagnosis not present

## 2018-08-16 DIAGNOSIS — E8809 Other disorders of plasma-protein metabolism, not elsewhere classified: Secondary | ICD-10-CM | POA: Diagnosis not present

## 2018-08-16 DIAGNOSIS — D500 Iron deficiency anemia secondary to blood loss (chronic): Secondary | ICD-10-CM | POA: Diagnosis not present

## 2018-08-16 DIAGNOSIS — N186 End stage renal disease: Secondary | ICD-10-CM | POA: Diagnosis not present

## 2018-08-19 DIAGNOSIS — D631 Anemia in chronic kidney disease: Secondary | ICD-10-CM | POA: Diagnosis not present

## 2018-08-19 DIAGNOSIS — E8809 Other disorders of plasma-protein metabolism, not elsewhere classified: Secondary | ICD-10-CM | POA: Diagnosis not present

## 2018-08-19 DIAGNOSIS — N2581 Secondary hyperparathyroidism of renal origin: Secondary | ICD-10-CM | POA: Diagnosis not present

## 2018-08-19 DIAGNOSIS — D500 Iron deficiency anemia secondary to blood loss (chronic): Secondary | ICD-10-CM | POA: Diagnosis not present

## 2018-08-19 DIAGNOSIS — N186 End stage renal disease: Secondary | ICD-10-CM | POA: Diagnosis not present

## 2018-08-21 DIAGNOSIS — D631 Anemia in chronic kidney disease: Secondary | ICD-10-CM | POA: Diagnosis not present

## 2018-08-21 DIAGNOSIS — E8809 Other disorders of plasma-protein metabolism, not elsewhere classified: Secondary | ICD-10-CM | POA: Diagnosis not present

## 2018-08-21 DIAGNOSIS — D500 Iron deficiency anemia secondary to blood loss (chronic): Secondary | ICD-10-CM | POA: Diagnosis not present

## 2018-08-21 DIAGNOSIS — N2581 Secondary hyperparathyroidism of renal origin: Secondary | ICD-10-CM | POA: Diagnosis not present

## 2018-08-21 DIAGNOSIS — N186 End stage renal disease: Secondary | ICD-10-CM | POA: Diagnosis not present

## 2018-08-23 DIAGNOSIS — E8809 Other disorders of plasma-protein metabolism, not elsewhere classified: Secondary | ICD-10-CM | POA: Diagnosis not present

## 2018-08-23 DIAGNOSIS — N2581 Secondary hyperparathyroidism of renal origin: Secondary | ICD-10-CM | POA: Diagnosis not present

## 2018-08-23 DIAGNOSIS — N186 End stage renal disease: Secondary | ICD-10-CM | POA: Diagnosis not present

## 2018-08-23 DIAGNOSIS — D631 Anemia in chronic kidney disease: Secondary | ICD-10-CM | POA: Diagnosis not present

## 2018-08-23 DIAGNOSIS — D500 Iron deficiency anemia secondary to blood loss (chronic): Secondary | ICD-10-CM | POA: Diagnosis not present

## 2018-08-26 DIAGNOSIS — E8809 Other disorders of plasma-protein metabolism, not elsewhere classified: Secondary | ICD-10-CM | POA: Diagnosis not present

## 2018-08-26 DIAGNOSIS — D631 Anemia in chronic kidney disease: Secondary | ICD-10-CM | POA: Diagnosis not present

## 2018-08-26 DIAGNOSIS — D500 Iron deficiency anemia secondary to blood loss (chronic): Secondary | ICD-10-CM | POA: Diagnosis not present

## 2018-08-26 DIAGNOSIS — N186 End stage renal disease: Secondary | ICD-10-CM | POA: Diagnosis not present

## 2018-08-26 DIAGNOSIS — N2581 Secondary hyperparathyroidism of renal origin: Secondary | ICD-10-CM | POA: Diagnosis not present

## 2018-08-27 DIAGNOSIS — I10 Essential (primary) hypertension: Secondary | ICD-10-CM | POA: Diagnosis not present

## 2018-08-27 DIAGNOSIS — E119 Type 2 diabetes mellitus without complications: Secondary | ICD-10-CM | POA: Diagnosis not present

## 2018-08-28 DIAGNOSIS — E8809 Other disorders of plasma-protein metabolism, not elsewhere classified: Secondary | ICD-10-CM | POA: Diagnosis not present

## 2018-08-28 DIAGNOSIS — N186 End stage renal disease: Secondary | ICD-10-CM | POA: Diagnosis not present

## 2018-08-28 DIAGNOSIS — D631 Anemia in chronic kidney disease: Secondary | ICD-10-CM | POA: Diagnosis not present

## 2018-08-28 DIAGNOSIS — D500 Iron deficiency anemia secondary to blood loss (chronic): Secondary | ICD-10-CM | POA: Diagnosis not present

## 2018-08-28 DIAGNOSIS — N2581 Secondary hyperparathyroidism of renal origin: Secondary | ICD-10-CM | POA: Diagnosis not present

## 2018-08-30 DIAGNOSIS — N186 End stage renal disease: Secondary | ICD-10-CM | POA: Diagnosis not present

## 2018-08-30 DIAGNOSIS — N2581 Secondary hyperparathyroidism of renal origin: Secondary | ICD-10-CM | POA: Diagnosis not present

## 2018-08-30 DIAGNOSIS — D631 Anemia in chronic kidney disease: Secondary | ICD-10-CM | POA: Diagnosis not present

## 2018-08-30 DIAGNOSIS — E8809 Other disorders of plasma-protein metabolism, not elsewhere classified: Secondary | ICD-10-CM | POA: Diagnosis not present

## 2018-08-30 DIAGNOSIS — D500 Iron deficiency anemia secondary to blood loss (chronic): Secondary | ICD-10-CM | POA: Diagnosis not present

## 2018-08-31 DIAGNOSIS — N186 End stage renal disease: Secondary | ICD-10-CM | POA: Diagnosis not present

## 2018-08-31 DIAGNOSIS — Z992 Dependence on renal dialysis: Secondary | ICD-10-CM | POA: Diagnosis not present

## 2018-09-02 DIAGNOSIS — N2581 Secondary hyperparathyroidism of renal origin: Secondary | ICD-10-CM | POA: Diagnosis not present

## 2018-09-02 DIAGNOSIS — D631 Anemia in chronic kidney disease: Secondary | ICD-10-CM | POA: Diagnosis not present

## 2018-09-02 DIAGNOSIS — D6959 Other secondary thrombocytopenia: Secondary | ICD-10-CM | POA: Diagnosis not present

## 2018-09-02 DIAGNOSIS — N186 End stage renal disease: Secondary | ICD-10-CM | POA: Diagnosis not present

## 2018-09-02 DIAGNOSIS — D500 Iron deficiency anemia secondary to blood loss (chronic): Secondary | ICD-10-CM | POA: Diagnosis not present

## 2018-09-04 DIAGNOSIS — D6959 Other secondary thrombocytopenia: Secondary | ICD-10-CM | POA: Diagnosis not present

## 2018-09-04 DIAGNOSIS — N186 End stage renal disease: Secondary | ICD-10-CM | POA: Diagnosis not present

## 2018-09-04 DIAGNOSIS — D631 Anemia in chronic kidney disease: Secondary | ICD-10-CM | POA: Diagnosis not present

## 2018-09-04 DIAGNOSIS — N2581 Secondary hyperparathyroidism of renal origin: Secondary | ICD-10-CM | POA: Diagnosis not present

## 2018-09-04 DIAGNOSIS — D500 Iron deficiency anemia secondary to blood loss (chronic): Secondary | ICD-10-CM | POA: Diagnosis not present

## 2018-09-06 DIAGNOSIS — N186 End stage renal disease: Secondary | ICD-10-CM | POA: Diagnosis not present

## 2018-09-06 DIAGNOSIS — D500 Iron deficiency anemia secondary to blood loss (chronic): Secondary | ICD-10-CM | POA: Diagnosis not present

## 2018-09-06 DIAGNOSIS — D631 Anemia in chronic kidney disease: Secondary | ICD-10-CM | POA: Diagnosis not present

## 2018-09-06 DIAGNOSIS — D6959 Other secondary thrombocytopenia: Secondary | ICD-10-CM | POA: Diagnosis not present

## 2018-09-06 DIAGNOSIS — N2581 Secondary hyperparathyroidism of renal origin: Secondary | ICD-10-CM | POA: Diagnosis not present

## 2018-09-09 DIAGNOSIS — D6959 Other secondary thrombocytopenia: Secondary | ICD-10-CM | POA: Diagnosis not present

## 2018-09-09 DIAGNOSIS — D631 Anemia in chronic kidney disease: Secondary | ICD-10-CM | POA: Diagnosis not present

## 2018-09-09 DIAGNOSIS — N2581 Secondary hyperparathyroidism of renal origin: Secondary | ICD-10-CM | POA: Diagnosis not present

## 2018-09-09 DIAGNOSIS — D500 Iron deficiency anemia secondary to blood loss (chronic): Secondary | ICD-10-CM | POA: Diagnosis not present

## 2018-09-09 DIAGNOSIS — N186 End stage renal disease: Secondary | ICD-10-CM | POA: Diagnosis not present

## 2018-09-11 DIAGNOSIS — D631 Anemia in chronic kidney disease: Secondary | ICD-10-CM | POA: Diagnosis not present

## 2018-09-11 DIAGNOSIS — N2581 Secondary hyperparathyroidism of renal origin: Secondary | ICD-10-CM | POA: Diagnosis not present

## 2018-09-11 DIAGNOSIS — E1129 Type 2 diabetes mellitus with other diabetic kidney complication: Secondary | ICD-10-CM | POA: Diagnosis not present

## 2018-09-11 DIAGNOSIS — N186 End stage renal disease: Secondary | ICD-10-CM | POA: Diagnosis not present

## 2018-09-11 DIAGNOSIS — D6959 Other secondary thrombocytopenia: Secondary | ICD-10-CM | POA: Diagnosis not present

## 2018-09-11 DIAGNOSIS — D500 Iron deficiency anemia secondary to blood loss (chronic): Secondary | ICD-10-CM | POA: Diagnosis not present

## 2018-09-13 DIAGNOSIS — N186 End stage renal disease: Secondary | ICD-10-CM | POA: Diagnosis not present

## 2018-09-13 DIAGNOSIS — N2581 Secondary hyperparathyroidism of renal origin: Secondary | ICD-10-CM | POA: Diagnosis not present

## 2018-09-13 DIAGNOSIS — D500 Iron deficiency anemia secondary to blood loss (chronic): Secondary | ICD-10-CM | POA: Diagnosis not present

## 2018-09-13 DIAGNOSIS — D6959 Other secondary thrombocytopenia: Secondary | ICD-10-CM | POA: Diagnosis not present

## 2018-09-13 DIAGNOSIS — D631 Anemia in chronic kidney disease: Secondary | ICD-10-CM | POA: Diagnosis not present

## 2018-09-16 DIAGNOSIS — N186 End stage renal disease: Secondary | ICD-10-CM | POA: Diagnosis not present

## 2018-09-16 DIAGNOSIS — D6959 Other secondary thrombocytopenia: Secondary | ICD-10-CM | POA: Diagnosis not present

## 2018-09-16 DIAGNOSIS — D500 Iron deficiency anemia secondary to blood loss (chronic): Secondary | ICD-10-CM | POA: Diagnosis not present

## 2018-09-16 DIAGNOSIS — N2581 Secondary hyperparathyroidism of renal origin: Secondary | ICD-10-CM | POA: Diagnosis not present

## 2018-09-16 DIAGNOSIS — D631 Anemia in chronic kidney disease: Secondary | ICD-10-CM | POA: Diagnosis not present

## 2018-09-17 DIAGNOSIS — I10 Essential (primary) hypertension: Secondary | ICD-10-CM | POA: Diagnosis not present

## 2018-09-17 DIAGNOSIS — E119 Type 2 diabetes mellitus without complications: Secondary | ICD-10-CM | POA: Diagnosis not present

## 2018-09-18 DIAGNOSIS — D631 Anemia in chronic kidney disease: Secondary | ICD-10-CM | POA: Diagnosis not present

## 2018-09-18 DIAGNOSIS — D500 Iron deficiency anemia secondary to blood loss (chronic): Secondary | ICD-10-CM | POA: Diagnosis not present

## 2018-09-18 DIAGNOSIS — D6959 Other secondary thrombocytopenia: Secondary | ICD-10-CM | POA: Diagnosis not present

## 2018-09-18 DIAGNOSIS — N2581 Secondary hyperparathyroidism of renal origin: Secondary | ICD-10-CM | POA: Diagnosis not present

## 2018-09-18 DIAGNOSIS — N186 End stage renal disease: Secondary | ICD-10-CM | POA: Diagnosis not present

## 2018-09-20 DIAGNOSIS — N186 End stage renal disease: Secondary | ICD-10-CM | POA: Diagnosis not present

## 2018-09-20 DIAGNOSIS — D500 Iron deficiency anemia secondary to blood loss (chronic): Secondary | ICD-10-CM | POA: Diagnosis not present

## 2018-09-20 DIAGNOSIS — D631 Anemia in chronic kidney disease: Secondary | ICD-10-CM | POA: Diagnosis not present

## 2018-09-20 DIAGNOSIS — D6959 Other secondary thrombocytopenia: Secondary | ICD-10-CM | POA: Diagnosis not present

## 2018-09-20 DIAGNOSIS — N2581 Secondary hyperparathyroidism of renal origin: Secondary | ICD-10-CM | POA: Diagnosis not present

## 2018-09-23 DIAGNOSIS — N2581 Secondary hyperparathyroidism of renal origin: Secondary | ICD-10-CM | POA: Diagnosis not present

## 2018-09-23 DIAGNOSIS — D631 Anemia in chronic kidney disease: Secondary | ICD-10-CM | POA: Diagnosis not present

## 2018-09-23 DIAGNOSIS — N186 End stage renal disease: Secondary | ICD-10-CM | POA: Diagnosis not present

## 2018-09-23 DIAGNOSIS — D500 Iron deficiency anemia secondary to blood loss (chronic): Secondary | ICD-10-CM | POA: Diagnosis not present

## 2018-09-23 DIAGNOSIS — D6959 Other secondary thrombocytopenia: Secondary | ICD-10-CM | POA: Diagnosis not present

## 2018-09-25 DIAGNOSIS — N2581 Secondary hyperparathyroidism of renal origin: Secondary | ICD-10-CM | POA: Diagnosis not present

## 2018-09-25 DIAGNOSIS — D6959 Other secondary thrombocytopenia: Secondary | ICD-10-CM | POA: Diagnosis not present

## 2018-09-25 DIAGNOSIS — D631 Anemia in chronic kidney disease: Secondary | ICD-10-CM | POA: Diagnosis not present

## 2018-09-25 DIAGNOSIS — D500 Iron deficiency anemia secondary to blood loss (chronic): Secondary | ICD-10-CM | POA: Diagnosis not present

## 2018-09-25 DIAGNOSIS — N186 End stage renal disease: Secondary | ICD-10-CM | POA: Diagnosis not present

## 2018-09-26 DIAGNOSIS — Z789 Other specified health status: Secondary | ICD-10-CM | POA: Diagnosis not present

## 2018-09-26 DIAGNOSIS — E1165 Type 2 diabetes mellitus with hyperglycemia: Secondary | ICD-10-CM | POA: Diagnosis not present

## 2018-09-26 DIAGNOSIS — Z6826 Body mass index (BMI) 26.0-26.9, adult: Secondary | ICD-10-CM | POA: Diagnosis not present

## 2018-09-26 DIAGNOSIS — N186 End stage renal disease: Secondary | ICD-10-CM | POA: Diagnosis not present

## 2018-09-26 DIAGNOSIS — I10 Essential (primary) hypertension: Secondary | ICD-10-CM | POA: Diagnosis not present

## 2018-09-26 DIAGNOSIS — E1122 Type 2 diabetes mellitus with diabetic chronic kidney disease: Secondary | ICD-10-CM | POA: Diagnosis not present

## 2018-09-26 DIAGNOSIS — Z299 Encounter for prophylactic measures, unspecified: Secondary | ICD-10-CM | POA: Diagnosis not present

## 2018-09-27 DIAGNOSIS — D6959 Other secondary thrombocytopenia: Secondary | ICD-10-CM | POA: Diagnosis not present

## 2018-09-27 DIAGNOSIS — D500 Iron deficiency anemia secondary to blood loss (chronic): Secondary | ICD-10-CM | POA: Diagnosis not present

## 2018-09-27 DIAGNOSIS — N2581 Secondary hyperparathyroidism of renal origin: Secondary | ICD-10-CM | POA: Diagnosis not present

## 2018-09-27 DIAGNOSIS — N186 End stage renal disease: Secondary | ICD-10-CM | POA: Diagnosis not present

## 2018-09-27 DIAGNOSIS — D631 Anemia in chronic kidney disease: Secondary | ICD-10-CM | POA: Diagnosis not present

## 2018-09-30 DIAGNOSIS — N2581 Secondary hyperparathyroidism of renal origin: Secondary | ICD-10-CM | POA: Diagnosis not present

## 2018-09-30 DIAGNOSIS — N186 End stage renal disease: Secondary | ICD-10-CM | POA: Diagnosis not present

## 2018-09-30 DIAGNOSIS — D6959 Other secondary thrombocytopenia: Secondary | ICD-10-CM | POA: Diagnosis not present

## 2018-09-30 DIAGNOSIS — D631 Anemia in chronic kidney disease: Secondary | ICD-10-CM | POA: Diagnosis not present

## 2018-09-30 DIAGNOSIS — E1140 Type 2 diabetes mellitus with diabetic neuropathy, unspecified: Secondary | ICD-10-CM | POA: Diagnosis not present

## 2018-09-30 DIAGNOSIS — Z992 Dependence on renal dialysis: Secondary | ICD-10-CM | POA: Diagnosis not present

## 2018-09-30 DIAGNOSIS — D500 Iron deficiency anemia secondary to blood loss (chronic): Secondary | ICD-10-CM | POA: Diagnosis not present

## 2018-09-30 DIAGNOSIS — E1159 Type 2 diabetes mellitus with other circulatory complications: Secondary | ICD-10-CM | POA: Diagnosis not present

## 2018-10-02 DIAGNOSIS — D631 Anemia in chronic kidney disease: Secondary | ICD-10-CM | POA: Diagnosis not present

## 2018-10-02 DIAGNOSIS — D500 Iron deficiency anemia secondary to blood loss (chronic): Secondary | ICD-10-CM | POA: Diagnosis not present

## 2018-10-02 DIAGNOSIS — N186 End stage renal disease: Secondary | ICD-10-CM | POA: Diagnosis not present

## 2018-10-02 DIAGNOSIS — N2581 Secondary hyperparathyroidism of renal origin: Secondary | ICD-10-CM | POA: Diagnosis not present

## 2018-10-02 DIAGNOSIS — E8809 Other disorders of plasma-protein metabolism, not elsewhere classified: Secondary | ICD-10-CM | POA: Diagnosis not present

## 2018-10-04 DIAGNOSIS — N2581 Secondary hyperparathyroidism of renal origin: Secondary | ICD-10-CM | POA: Diagnosis not present

## 2018-10-04 DIAGNOSIS — D631 Anemia in chronic kidney disease: Secondary | ICD-10-CM | POA: Diagnosis not present

## 2018-10-04 DIAGNOSIS — E8809 Other disorders of plasma-protein metabolism, not elsewhere classified: Secondary | ICD-10-CM | POA: Diagnosis not present

## 2018-10-04 DIAGNOSIS — N186 End stage renal disease: Secondary | ICD-10-CM | POA: Diagnosis not present

## 2018-10-04 DIAGNOSIS — D500 Iron deficiency anemia secondary to blood loss (chronic): Secondary | ICD-10-CM | POA: Diagnosis not present

## 2018-10-07 DIAGNOSIS — D631 Anemia in chronic kidney disease: Secondary | ICD-10-CM | POA: Diagnosis not present

## 2018-10-07 DIAGNOSIS — N186 End stage renal disease: Secondary | ICD-10-CM | POA: Diagnosis not present

## 2018-10-07 DIAGNOSIS — N2581 Secondary hyperparathyroidism of renal origin: Secondary | ICD-10-CM | POA: Diagnosis not present

## 2018-10-07 DIAGNOSIS — E8809 Other disorders of plasma-protein metabolism, not elsewhere classified: Secondary | ICD-10-CM | POA: Diagnosis not present

## 2018-10-07 DIAGNOSIS — D500 Iron deficiency anemia secondary to blood loss (chronic): Secondary | ICD-10-CM | POA: Diagnosis not present

## 2018-10-09 DIAGNOSIS — N2581 Secondary hyperparathyroidism of renal origin: Secondary | ICD-10-CM | POA: Diagnosis not present

## 2018-10-09 DIAGNOSIS — D631 Anemia in chronic kidney disease: Secondary | ICD-10-CM | POA: Diagnosis not present

## 2018-10-09 DIAGNOSIS — D500 Iron deficiency anemia secondary to blood loss (chronic): Secondary | ICD-10-CM | POA: Diagnosis not present

## 2018-10-09 DIAGNOSIS — E8809 Other disorders of plasma-protein metabolism, not elsewhere classified: Secondary | ICD-10-CM | POA: Diagnosis not present

## 2018-10-09 DIAGNOSIS — N186 End stage renal disease: Secondary | ICD-10-CM | POA: Diagnosis not present

## 2018-10-11 DIAGNOSIS — N186 End stage renal disease: Secondary | ICD-10-CM | POA: Diagnosis not present

## 2018-10-11 DIAGNOSIS — N2581 Secondary hyperparathyroidism of renal origin: Secondary | ICD-10-CM | POA: Diagnosis not present

## 2018-10-11 DIAGNOSIS — E8809 Other disorders of plasma-protein metabolism, not elsewhere classified: Secondary | ICD-10-CM | POA: Diagnosis not present

## 2018-10-11 DIAGNOSIS — D631 Anemia in chronic kidney disease: Secondary | ICD-10-CM | POA: Diagnosis not present

## 2018-10-11 DIAGNOSIS — D500 Iron deficiency anemia secondary to blood loss (chronic): Secondary | ICD-10-CM | POA: Diagnosis not present

## 2018-10-14 DIAGNOSIS — D631 Anemia in chronic kidney disease: Secondary | ICD-10-CM | POA: Diagnosis not present

## 2018-10-14 DIAGNOSIS — E8809 Other disorders of plasma-protein metabolism, not elsewhere classified: Secondary | ICD-10-CM | POA: Diagnosis not present

## 2018-10-14 DIAGNOSIS — N2581 Secondary hyperparathyroidism of renal origin: Secondary | ICD-10-CM | POA: Diagnosis not present

## 2018-10-14 DIAGNOSIS — D500 Iron deficiency anemia secondary to blood loss (chronic): Secondary | ICD-10-CM | POA: Diagnosis not present

## 2018-10-14 DIAGNOSIS — N186 End stage renal disease: Secondary | ICD-10-CM | POA: Diagnosis not present

## 2018-10-15 DIAGNOSIS — E1122 Type 2 diabetes mellitus with diabetic chronic kidney disease: Secondary | ICD-10-CM | POA: Diagnosis not present

## 2018-10-15 DIAGNOSIS — E1140 Type 2 diabetes mellitus with diabetic neuropathy, unspecified: Secondary | ICD-10-CM | POA: Diagnosis not present

## 2018-10-15 DIAGNOSIS — Z299 Encounter for prophylactic measures, unspecified: Secondary | ICD-10-CM | POA: Diagnosis not present

## 2018-10-15 DIAGNOSIS — Z6826 Body mass index (BMI) 26.0-26.9, adult: Secondary | ICD-10-CM | POA: Diagnosis not present

## 2018-10-15 DIAGNOSIS — E1165 Type 2 diabetes mellitus with hyperglycemia: Secondary | ICD-10-CM | POA: Diagnosis not present

## 2018-10-15 DIAGNOSIS — I10 Essential (primary) hypertension: Secondary | ICD-10-CM | POA: Diagnosis not present

## 2018-10-16 DIAGNOSIS — N186 End stage renal disease: Secondary | ICD-10-CM | POA: Diagnosis not present

## 2018-10-16 DIAGNOSIS — D631 Anemia in chronic kidney disease: Secondary | ICD-10-CM | POA: Diagnosis not present

## 2018-10-16 DIAGNOSIS — E8809 Other disorders of plasma-protein metabolism, not elsewhere classified: Secondary | ICD-10-CM | POA: Diagnosis not present

## 2018-10-16 DIAGNOSIS — D500 Iron deficiency anemia secondary to blood loss (chronic): Secondary | ICD-10-CM | POA: Diagnosis not present

## 2018-10-16 DIAGNOSIS — N2581 Secondary hyperparathyroidism of renal origin: Secondary | ICD-10-CM | POA: Diagnosis not present

## 2018-10-18 DIAGNOSIS — N186 End stage renal disease: Secondary | ICD-10-CM | POA: Diagnosis not present

## 2018-10-18 DIAGNOSIS — E8809 Other disorders of plasma-protein metabolism, not elsewhere classified: Secondary | ICD-10-CM | POA: Diagnosis not present

## 2018-10-18 DIAGNOSIS — N2581 Secondary hyperparathyroidism of renal origin: Secondary | ICD-10-CM | POA: Diagnosis not present

## 2018-10-18 DIAGNOSIS — D631 Anemia in chronic kidney disease: Secondary | ICD-10-CM | POA: Diagnosis not present

## 2018-10-18 DIAGNOSIS — D500 Iron deficiency anemia secondary to blood loss (chronic): Secondary | ICD-10-CM | POA: Diagnosis not present

## 2018-10-20 DIAGNOSIS — E119 Type 2 diabetes mellitus without complications: Secondary | ICD-10-CM | POA: Diagnosis not present

## 2018-10-20 DIAGNOSIS — I10 Essential (primary) hypertension: Secondary | ICD-10-CM | POA: Diagnosis not present

## 2018-10-21 DIAGNOSIS — D500 Iron deficiency anemia secondary to blood loss (chronic): Secondary | ICD-10-CM | POA: Diagnosis not present

## 2018-10-21 DIAGNOSIS — N186 End stage renal disease: Secondary | ICD-10-CM | POA: Diagnosis not present

## 2018-10-21 DIAGNOSIS — E8809 Other disorders of plasma-protein metabolism, not elsewhere classified: Secondary | ICD-10-CM | POA: Diagnosis not present

## 2018-10-21 DIAGNOSIS — N2581 Secondary hyperparathyroidism of renal origin: Secondary | ICD-10-CM | POA: Diagnosis not present

## 2018-10-21 DIAGNOSIS — D631 Anemia in chronic kidney disease: Secondary | ICD-10-CM | POA: Diagnosis not present

## 2018-10-23 DIAGNOSIS — D631 Anemia in chronic kidney disease: Secondary | ICD-10-CM | POA: Diagnosis not present

## 2018-10-23 DIAGNOSIS — N186 End stage renal disease: Secondary | ICD-10-CM | POA: Diagnosis not present

## 2018-10-23 DIAGNOSIS — E8809 Other disorders of plasma-protein metabolism, not elsewhere classified: Secondary | ICD-10-CM | POA: Diagnosis not present

## 2018-10-23 DIAGNOSIS — N2581 Secondary hyperparathyroidism of renal origin: Secondary | ICD-10-CM | POA: Diagnosis not present

## 2018-10-23 DIAGNOSIS — D500 Iron deficiency anemia secondary to blood loss (chronic): Secondary | ICD-10-CM | POA: Diagnosis not present

## 2018-10-25 DIAGNOSIS — N186 End stage renal disease: Secondary | ICD-10-CM | POA: Diagnosis not present

## 2018-10-25 DIAGNOSIS — D500 Iron deficiency anemia secondary to blood loss (chronic): Secondary | ICD-10-CM | POA: Diagnosis not present

## 2018-10-25 DIAGNOSIS — D631 Anemia in chronic kidney disease: Secondary | ICD-10-CM | POA: Diagnosis not present

## 2018-10-25 DIAGNOSIS — E8809 Other disorders of plasma-protein metabolism, not elsewhere classified: Secondary | ICD-10-CM | POA: Diagnosis not present

## 2018-10-25 DIAGNOSIS — N2581 Secondary hyperparathyroidism of renal origin: Secondary | ICD-10-CM | POA: Diagnosis not present

## 2018-10-28 DIAGNOSIS — E8809 Other disorders of plasma-protein metabolism, not elsewhere classified: Secondary | ICD-10-CM | POA: Diagnosis not present

## 2018-10-28 DIAGNOSIS — N2581 Secondary hyperparathyroidism of renal origin: Secondary | ICD-10-CM | POA: Diagnosis not present

## 2018-10-28 DIAGNOSIS — D500 Iron deficiency anemia secondary to blood loss (chronic): Secondary | ICD-10-CM | POA: Diagnosis not present

## 2018-10-28 DIAGNOSIS — D631 Anemia in chronic kidney disease: Secondary | ICD-10-CM | POA: Diagnosis not present

## 2018-10-28 DIAGNOSIS — N186 End stage renal disease: Secondary | ICD-10-CM | POA: Diagnosis not present

## 2018-10-29 DIAGNOSIS — Z299 Encounter for prophylactic measures, unspecified: Secondary | ICD-10-CM | POA: Diagnosis not present

## 2018-10-29 DIAGNOSIS — Z6826 Body mass index (BMI) 26.0-26.9, adult: Secondary | ICD-10-CM | POA: Diagnosis not present

## 2018-10-29 DIAGNOSIS — I10 Essential (primary) hypertension: Secondary | ICD-10-CM | POA: Diagnosis not present

## 2018-10-29 DIAGNOSIS — E1165 Type 2 diabetes mellitus with hyperglycemia: Secondary | ICD-10-CM | POA: Diagnosis not present

## 2018-10-29 DIAGNOSIS — N186 End stage renal disease: Secondary | ICD-10-CM | POA: Diagnosis not present

## 2018-10-29 DIAGNOSIS — E1122 Type 2 diabetes mellitus with diabetic chronic kidney disease: Secondary | ICD-10-CM | POA: Diagnosis not present

## 2018-10-30 DIAGNOSIS — D631 Anemia in chronic kidney disease: Secondary | ICD-10-CM | POA: Diagnosis not present

## 2018-10-30 DIAGNOSIS — N2581 Secondary hyperparathyroidism of renal origin: Secondary | ICD-10-CM | POA: Diagnosis not present

## 2018-10-30 DIAGNOSIS — E8809 Other disorders of plasma-protein metabolism, not elsewhere classified: Secondary | ICD-10-CM | POA: Diagnosis not present

## 2018-10-30 DIAGNOSIS — N186 End stage renal disease: Secondary | ICD-10-CM | POA: Diagnosis not present

## 2018-10-30 DIAGNOSIS — D500 Iron deficiency anemia secondary to blood loss (chronic): Secondary | ICD-10-CM | POA: Diagnosis not present

## 2018-10-31 DIAGNOSIS — Z992 Dependence on renal dialysis: Secondary | ICD-10-CM | POA: Diagnosis not present

## 2018-10-31 DIAGNOSIS — N186 End stage renal disease: Secondary | ICD-10-CM | POA: Diagnosis not present

## 2018-11-01 DIAGNOSIS — N186 End stage renal disease: Secondary | ICD-10-CM | POA: Diagnosis not present

## 2018-11-01 DIAGNOSIS — D500 Iron deficiency anemia secondary to blood loss (chronic): Secondary | ICD-10-CM | POA: Diagnosis not present

## 2018-11-01 DIAGNOSIS — D631 Anemia in chronic kidney disease: Secondary | ICD-10-CM | POA: Diagnosis not present

## 2018-11-01 DIAGNOSIS — E8809 Other disorders of plasma-protein metabolism, not elsewhere classified: Secondary | ICD-10-CM | POA: Diagnosis not present

## 2018-11-01 DIAGNOSIS — N2581 Secondary hyperparathyroidism of renal origin: Secondary | ICD-10-CM | POA: Diagnosis not present

## 2018-11-01 DIAGNOSIS — Z992 Dependence on renal dialysis: Secondary | ICD-10-CM | POA: Diagnosis not present

## 2018-11-04 DIAGNOSIS — D631 Anemia in chronic kidney disease: Secondary | ICD-10-CM | POA: Diagnosis not present

## 2018-11-04 DIAGNOSIS — Z992 Dependence on renal dialysis: Secondary | ICD-10-CM | POA: Diagnosis not present

## 2018-11-04 DIAGNOSIS — N186 End stage renal disease: Secondary | ICD-10-CM | POA: Diagnosis not present

## 2018-11-04 DIAGNOSIS — N2581 Secondary hyperparathyroidism of renal origin: Secondary | ICD-10-CM | POA: Diagnosis not present

## 2018-11-04 DIAGNOSIS — D500 Iron deficiency anemia secondary to blood loss (chronic): Secondary | ICD-10-CM | POA: Diagnosis not present

## 2018-11-06 DIAGNOSIS — N2581 Secondary hyperparathyroidism of renal origin: Secondary | ICD-10-CM | POA: Diagnosis not present

## 2018-11-06 DIAGNOSIS — D631 Anemia in chronic kidney disease: Secondary | ICD-10-CM | POA: Diagnosis not present

## 2018-11-06 DIAGNOSIS — N186 End stage renal disease: Secondary | ICD-10-CM | POA: Diagnosis not present

## 2018-11-06 DIAGNOSIS — Z992 Dependence on renal dialysis: Secondary | ICD-10-CM | POA: Diagnosis not present

## 2018-11-06 DIAGNOSIS — D500 Iron deficiency anemia secondary to blood loss (chronic): Secondary | ICD-10-CM | POA: Diagnosis not present

## 2018-11-08 DIAGNOSIS — N186 End stage renal disease: Secondary | ICD-10-CM | POA: Diagnosis not present

## 2018-11-08 DIAGNOSIS — N2581 Secondary hyperparathyroidism of renal origin: Secondary | ICD-10-CM | POA: Diagnosis not present

## 2018-11-08 DIAGNOSIS — Z992 Dependence on renal dialysis: Secondary | ICD-10-CM | POA: Diagnosis not present

## 2018-11-08 DIAGNOSIS — D500 Iron deficiency anemia secondary to blood loss (chronic): Secondary | ICD-10-CM | POA: Diagnosis not present

## 2018-11-08 DIAGNOSIS — D631 Anemia in chronic kidney disease: Secondary | ICD-10-CM | POA: Diagnosis not present

## 2018-11-11 DIAGNOSIS — D500 Iron deficiency anemia secondary to blood loss (chronic): Secondary | ICD-10-CM | POA: Diagnosis not present

## 2018-11-11 DIAGNOSIS — N2581 Secondary hyperparathyroidism of renal origin: Secondary | ICD-10-CM | POA: Diagnosis not present

## 2018-11-11 DIAGNOSIS — Z992 Dependence on renal dialysis: Secondary | ICD-10-CM | POA: Diagnosis not present

## 2018-11-11 DIAGNOSIS — N186 End stage renal disease: Secondary | ICD-10-CM | POA: Diagnosis not present

## 2018-11-11 DIAGNOSIS — D631 Anemia in chronic kidney disease: Secondary | ICD-10-CM | POA: Diagnosis not present

## 2018-11-13 DIAGNOSIS — N2581 Secondary hyperparathyroidism of renal origin: Secondary | ICD-10-CM | POA: Diagnosis not present

## 2018-11-13 DIAGNOSIS — N186 End stage renal disease: Secondary | ICD-10-CM | POA: Diagnosis not present

## 2018-11-13 DIAGNOSIS — Z992 Dependence on renal dialysis: Secondary | ICD-10-CM | POA: Diagnosis not present

## 2018-11-13 DIAGNOSIS — D631 Anemia in chronic kidney disease: Secondary | ICD-10-CM | POA: Diagnosis not present

## 2018-11-13 DIAGNOSIS — D500 Iron deficiency anemia secondary to blood loss (chronic): Secondary | ICD-10-CM | POA: Diagnosis not present

## 2018-11-15 DIAGNOSIS — D631 Anemia in chronic kidney disease: Secondary | ICD-10-CM | POA: Diagnosis not present

## 2018-11-15 DIAGNOSIS — N186 End stage renal disease: Secondary | ICD-10-CM | POA: Diagnosis not present

## 2018-11-15 DIAGNOSIS — D500 Iron deficiency anemia secondary to blood loss (chronic): Secondary | ICD-10-CM | POA: Diagnosis not present

## 2018-11-15 DIAGNOSIS — Z992 Dependence on renal dialysis: Secondary | ICD-10-CM | POA: Diagnosis not present

## 2018-11-15 DIAGNOSIS — N2581 Secondary hyperparathyroidism of renal origin: Secondary | ICD-10-CM | POA: Diagnosis not present

## 2018-11-18 DIAGNOSIS — D500 Iron deficiency anemia secondary to blood loss (chronic): Secondary | ICD-10-CM | POA: Diagnosis not present

## 2018-11-18 DIAGNOSIS — Z992 Dependence on renal dialysis: Secondary | ICD-10-CM | POA: Diagnosis not present

## 2018-11-18 DIAGNOSIS — D631 Anemia in chronic kidney disease: Secondary | ICD-10-CM | POA: Diagnosis not present

## 2018-11-18 DIAGNOSIS — N186 End stage renal disease: Secondary | ICD-10-CM | POA: Diagnosis not present

## 2018-11-18 DIAGNOSIS — N2581 Secondary hyperparathyroidism of renal origin: Secondary | ICD-10-CM | POA: Diagnosis not present

## 2018-11-20 DIAGNOSIS — Z992 Dependence on renal dialysis: Secondary | ICD-10-CM | POA: Diagnosis not present

## 2018-11-20 DIAGNOSIS — D500 Iron deficiency anemia secondary to blood loss (chronic): Secondary | ICD-10-CM | POA: Diagnosis not present

## 2018-11-20 DIAGNOSIS — N2581 Secondary hyperparathyroidism of renal origin: Secondary | ICD-10-CM | POA: Diagnosis not present

## 2018-11-20 DIAGNOSIS — N186 End stage renal disease: Secondary | ICD-10-CM | POA: Diagnosis not present

## 2018-11-20 DIAGNOSIS — D631 Anemia in chronic kidney disease: Secondary | ICD-10-CM | POA: Diagnosis not present

## 2018-11-22 DIAGNOSIS — D631 Anemia in chronic kidney disease: Secondary | ICD-10-CM | POA: Diagnosis not present

## 2018-11-22 DIAGNOSIS — Z992 Dependence on renal dialysis: Secondary | ICD-10-CM | POA: Diagnosis not present

## 2018-11-22 DIAGNOSIS — D500 Iron deficiency anemia secondary to blood loss (chronic): Secondary | ICD-10-CM | POA: Diagnosis not present

## 2018-11-22 DIAGNOSIS — N2581 Secondary hyperparathyroidism of renal origin: Secondary | ICD-10-CM | POA: Diagnosis not present

## 2018-11-22 DIAGNOSIS — N186 End stage renal disease: Secondary | ICD-10-CM | POA: Diagnosis not present

## 2018-11-25 DIAGNOSIS — D631 Anemia in chronic kidney disease: Secondary | ICD-10-CM | POA: Diagnosis not present

## 2018-11-25 DIAGNOSIS — D500 Iron deficiency anemia secondary to blood loss (chronic): Secondary | ICD-10-CM | POA: Diagnosis not present

## 2018-11-25 DIAGNOSIS — N186 End stage renal disease: Secondary | ICD-10-CM | POA: Diagnosis not present

## 2018-11-25 DIAGNOSIS — Z992 Dependence on renal dialysis: Secondary | ICD-10-CM | POA: Diagnosis not present

## 2018-11-25 DIAGNOSIS — N2581 Secondary hyperparathyroidism of renal origin: Secondary | ICD-10-CM | POA: Diagnosis not present

## 2018-11-27 DIAGNOSIS — N186 End stage renal disease: Secondary | ICD-10-CM | POA: Diagnosis not present

## 2018-11-27 DIAGNOSIS — Z992 Dependence on renal dialysis: Secondary | ICD-10-CM | POA: Diagnosis not present

## 2018-11-27 DIAGNOSIS — D500 Iron deficiency anemia secondary to blood loss (chronic): Secondary | ICD-10-CM | POA: Diagnosis not present

## 2018-11-27 DIAGNOSIS — D631 Anemia in chronic kidney disease: Secondary | ICD-10-CM | POA: Diagnosis not present

## 2018-11-27 DIAGNOSIS — N2581 Secondary hyperparathyroidism of renal origin: Secondary | ICD-10-CM | POA: Diagnosis not present

## 2018-11-29 DIAGNOSIS — Z992 Dependence on renal dialysis: Secondary | ICD-10-CM | POA: Diagnosis not present

## 2018-11-29 DIAGNOSIS — D500 Iron deficiency anemia secondary to blood loss (chronic): Secondary | ICD-10-CM | POA: Diagnosis not present

## 2018-11-29 DIAGNOSIS — N186 End stage renal disease: Secondary | ICD-10-CM | POA: Diagnosis not present

## 2018-11-29 DIAGNOSIS — D631 Anemia in chronic kidney disease: Secondary | ICD-10-CM | POA: Diagnosis not present

## 2018-11-29 DIAGNOSIS — N2581 Secondary hyperparathyroidism of renal origin: Secondary | ICD-10-CM | POA: Diagnosis not present

## 2018-12-01 DIAGNOSIS — Z992 Dependence on renal dialysis: Secondary | ICD-10-CM | POA: Diagnosis not present

## 2018-12-01 DIAGNOSIS — N186 End stage renal disease: Secondary | ICD-10-CM | POA: Diagnosis not present

## 2018-12-02 DIAGNOSIS — Z23 Encounter for immunization: Secondary | ICD-10-CM | POA: Diagnosis not present

## 2018-12-02 DIAGNOSIS — Z992 Dependence on renal dialysis: Secondary | ICD-10-CM | POA: Diagnosis not present

## 2018-12-02 DIAGNOSIS — N2581 Secondary hyperparathyroidism of renal origin: Secondary | ICD-10-CM | POA: Diagnosis not present

## 2018-12-02 DIAGNOSIS — D6959 Other secondary thrombocytopenia: Secondary | ICD-10-CM | POA: Diagnosis not present

## 2018-12-02 DIAGNOSIS — D500 Iron deficiency anemia secondary to blood loss (chronic): Secondary | ICD-10-CM | POA: Diagnosis not present

## 2018-12-02 DIAGNOSIS — N186 End stage renal disease: Secondary | ICD-10-CM | POA: Diagnosis not present

## 2018-12-02 DIAGNOSIS — D631 Anemia in chronic kidney disease: Secondary | ICD-10-CM | POA: Diagnosis not present

## 2018-12-04 DIAGNOSIS — N2581 Secondary hyperparathyroidism of renal origin: Secondary | ICD-10-CM | POA: Diagnosis not present

## 2018-12-04 DIAGNOSIS — D631 Anemia in chronic kidney disease: Secondary | ICD-10-CM | POA: Diagnosis not present

## 2018-12-04 DIAGNOSIS — Z992 Dependence on renal dialysis: Secondary | ICD-10-CM | POA: Diagnosis not present

## 2018-12-04 DIAGNOSIS — N186 End stage renal disease: Secondary | ICD-10-CM | POA: Diagnosis not present

## 2018-12-04 DIAGNOSIS — D500 Iron deficiency anemia secondary to blood loss (chronic): Secondary | ICD-10-CM | POA: Diagnosis not present

## 2018-12-04 DIAGNOSIS — D6959 Other secondary thrombocytopenia: Secondary | ICD-10-CM | POA: Diagnosis not present

## 2018-12-06 DIAGNOSIS — N2581 Secondary hyperparathyroidism of renal origin: Secondary | ICD-10-CM | POA: Diagnosis not present

## 2018-12-06 DIAGNOSIS — D500 Iron deficiency anemia secondary to blood loss (chronic): Secondary | ICD-10-CM | POA: Diagnosis not present

## 2018-12-06 DIAGNOSIS — Z992 Dependence on renal dialysis: Secondary | ICD-10-CM | POA: Diagnosis not present

## 2018-12-06 DIAGNOSIS — D6959 Other secondary thrombocytopenia: Secondary | ICD-10-CM | POA: Diagnosis not present

## 2018-12-06 DIAGNOSIS — D631 Anemia in chronic kidney disease: Secondary | ICD-10-CM | POA: Diagnosis not present

## 2018-12-06 DIAGNOSIS — N186 End stage renal disease: Secondary | ICD-10-CM | POA: Diagnosis not present

## 2018-12-09 DIAGNOSIS — Z992 Dependence on renal dialysis: Secondary | ICD-10-CM | POA: Diagnosis not present

## 2018-12-09 DIAGNOSIS — D6959 Other secondary thrombocytopenia: Secondary | ICD-10-CM | POA: Diagnosis not present

## 2018-12-09 DIAGNOSIS — N186 End stage renal disease: Secondary | ICD-10-CM | POA: Diagnosis not present

## 2018-12-09 DIAGNOSIS — D631 Anemia in chronic kidney disease: Secondary | ICD-10-CM | POA: Diagnosis not present

## 2018-12-09 DIAGNOSIS — N2581 Secondary hyperparathyroidism of renal origin: Secondary | ICD-10-CM | POA: Diagnosis not present

## 2018-12-09 DIAGNOSIS — D500 Iron deficiency anemia secondary to blood loss (chronic): Secondary | ICD-10-CM | POA: Diagnosis not present

## 2018-12-11 DIAGNOSIS — D6959 Other secondary thrombocytopenia: Secondary | ICD-10-CM | POA: Diagnosis not present

## 2018-12-11 DIAGNOSIS — Z992 Dependence on renal dialysis: Secondary | ICD-10-CM | POA: Diagnosis not present

## 2018-12-11 DIAGNOSIS — D500 Iron deficiency anemia secondary to blood loss (chronic): Secondary | ICD-10-CM | POA: Diagnosis not present

## 2018-12-11 DIAGNOSIS — N186 End stage renal disease: Secondary | ICD-10-CM | POA: Diagnosis not present

## 2018-12-11 DIAGNOSIS — D631 Anemia in chronic kidney disease: Secondary | ICD-10-CM | POA: Diagnosis not present

## 2018-12-11 DIAGNOSIS — N2581 Secondary hyperparathyroidism of renal origin: Secondary | ICD-10-CM | POA: Diagnosis not present

## 2018-12-11 DIAGNOSIS — E1129 Type 2 diabetes mellitus with other diabetic kidney complication: Secondary | ICD-10-CM | POA: Diagnosis not present

## 2018-12-13 DIAGNOSIS — N186 End stage renal disease: Secondary | ICD-10-CM | POA: Diagnosis not present

## 2018-12-13 DIAGNOSIS — D631 Anemia in chronic kidney disease: Secondary | ICD-10-CM | POA: Diagnosis not present

## 2018-12-13 DIAGNOSIS — D500 Iron deficiency anemia secondary to blood loss (chronic): Secondary | ICD-10-CM | POA: Diagnosis not present

## 2018-12-13 DIAGNOSIS — D6959 Other secondary thrombocytopenia: Secondary | ICD-10-CM | POA: Diagnosis not present

## 2018-12-13 DIAGNOSIS — N2581 Secondary hyperparathyroidism of renal origin: Secondary | ICD-10-CM | POA: Diagnosis not present

## 2018-12-13 DIAGNOSIS — Z992 Dependence on renal dialysis: Secondary | ICD-10-CM | POA: Diagnosis not present

## 2018-12-16 DIAGNOSIS — N186 End stage renal disease: Secondary | ICD-10-CM | POA: Diagnosis not present

## 2018-12-16 DIAGNOSIS — Z992 Dependence on renal dialysis: Secondary | ICD-10-CM | POA: Diagnosis not present

## 2018-12-16 DIAGNOSIS — D500 Iron deficiency anemia secondary to blood loss (chronic): Secondary | ICD-10-CM | POA: Diagnosis not present

## 2018-12-16 DIAGNOSIS — D631 Anemia in chronic kidney disease: Secondary | ICD-10-CM | POA: Diagnosis not present

## 2018-12-16 DIAGNOSIS — D6959 Other secondary thrombocytopenia: Secondary | ICD-10-CM | POA: Diagnosis not present

## 2018-12-16 DIAGNOSIS — N2581 Secondary hyperparathyroidism of renal origin: Secondary | ICD-10-CM | POA: Diagnosis not present

## 2018-12-17 DIAGNOSIS — I10 Essential (primary) hypertension: Secondary | ICD-10-CM | POA: Diagnosis not present

## 2018-12-17 DIAGNOSIS — E119 Type 2 diabetes mellitus without complications: Secondary | ICD-10-CM | POA: Diagnosis not present

## 2018-12-18 DIAGNOSIS — Z992 Dependence on renal dialysis: Secondary | ICD-10-CM | POA: Diagnosis not present

## 2018-12-18 DIAGNOSIS — D500 Iron deficiency anemia secondary to blood loss (chronic): Secondary | ICD-10-CM | POA: Diagnosis not present

## 2018-12-18 DIAGNOSIS — D631 Anemia in chronic kidney disease: Secondary | ICD-10-CM | POA: Diagnosis not present

## 2018-12-18 DIAGNOSIS — N2581 Secondary hyperparathyroidism of renal origin: Secondary | ICD-10-CM | POA: Diagnosis not present

## 2018-12-18 DIAGNOSIS — D6959 Other secondary thrombocytopenia: Secondary | ICD-10-CM | POA: Diagnosis not present

## 2018-12-18 DIAGNOSIS — N186 End stage renal disease: Secondary | ICD-10-CM | POA: Diagnosis not present

## 2018-12-20 DIAGNOSIS — D500 Iron deficiency anemia secondary to blood loss (chronic): Secondary | ICD-10-CM | POA: Diagnosis not present

## 2018-12-20 DIAGNOSIS — D631 Anemia in chronic kidney disease: Secondary | ICD-10-CM | POA: Diagnosis not present

## 2018-12-20 DIAGNOSIS — Z992 Dependence on renal dialysis: Secondary | ICD-10-CM | POA: Diagnosis not present

## 2018-12-20 DIAGNOSIS — N2581 Secondary hyperparathyroidism of renal origin: Secondary | ICD-10-CM | POA: Diagnosis not present

## 2018-12-20 DIAGNOSIS — D6959 Other secondary thrombocytopenia: Secondary | ICD-10-CM | POA: Diagnosis not present

## 2018-12-20 DIAGNOSIS — N186 End stage renal disease: Secondary | ICD-10-CM | POA: Diagnosis not present

## 2018-12-23 DIAGNOSIS — N186 End stage renal disease: Secondary | ICD-10-CM | POA: Diagnosis not present

## 2018-12-23 DIAGNOSIS — Z992 Dependence on renal dialysis: Secondary | ICD-10-CM | POA: Diagnosis not present

## 2018-12-23 DIAGNOSIS — D500 Iron deficiency anemia secondary to blood loss (chronic): Secondary | ICD-10-CM | POA: Diagnosis not present

## 2018-12-23 DIAGNOSIS — N2581 Secondary hyperparathyroidism of renal origin: Secondary | ICD-10-CM | POA: Diagnosis not present

## 2018-12-23 DIAGNOSIS — D6959 Other secondary thrombocytopenia: Secondary | ICD-10-CM | POA: Diagnosis not present

## 2018-12-23 DIAGNOSIS — D631 Anemia in chronic kidney disease: Secondary | ICD-10-CM | POA: Diagnosis not present

## 2018-12-25 DIAGNOSIS — D500 Iron deficiency anemia secondary to blood loss (chronic): Secondary | ICD-10-CM | POA: Diagnosis not present

## 2018-12-25 DIAGNOSIS — N186 End stage renal disease: Secondary | ICD-10-CM | POA: Diagnosis not present

## 2018-12-25 DIAGNOSIS — D631 Anemia in chronic kidney disease: Secondary | ICD-10-CM | POA: Diagnosis not present

## 2018-12-25 DIAGNOSIS — D6959 Other secondary thrombocytopenia: Secondary | ICD-10-CM | POA: Diagnosis not present

## 2018-12-25 DIAGNOSIS — N2581 Secondary hyperparathyroidism of renal origin: Secondary | ICD-10-CM | POA: Diagnosis not present

## 2018-12-25 DIAGNOSIS — Z992 Dependence on renal dialysis: Secondary | ICD-10-CM | POA: Diagnosis not present

## 2018-12-27 DIAGNOSIS — D6959 Other secondary thrombocytopenia: Secondary | ICD-10-CM | POA: Diagnosis not present

## 2018-12-27 DIAGNOSIS — N2581 Secondary hyperparathyroidism of renal origin: Secondary | ICD-10-CM | POA: Diagnosis not present

## 2018-12-27 DIAGNOSIS — Z992 Dependence on renal dialysis: Secondary | ICD-10-CM | POA: Diagnosis not present

## 2018-12-27 DIAGNOSIS — N186 End stage renal disease: Secondary | ICD-10-CM | POA: Diagnosis not present

## 2018-12-27 DIAGNOSIS — D631 Anemia in chronic kidney disease: Secondary | ICD-10-CM | POA: Diagnosis not present

## 2018-12-27 DIAGNOSIS — D500 Iron deficiency anemia secondary to blood loss (chronic): Secondary | ICD-10-CM | POA: Diagnosis not present

## 2018-12-30 DIAGNOSIS — Z992 Dependence on renal dialysis: Secondary | ICD-10-CM | POA: Diagnosis not present

## 2018-12-30 DIAGNOSIS — D500 Iron deficiency anemia secondary to blood loss (chronic): Secondary | ICD-10-CM | POA: Diagnosis not present

## 2018-12-30 DIAGNOSIS — D6959 Other secondary thrombocytopenia: Secondary | ICD-10-CM | POA: Diagnosis not present

## 2018-12-30 DIAGNOSIS — N2581 Secondary hyperparathyroidism of renal origin: Secondary | ICD-10-CM | POA: Diagnosis not present

## 2018-12-30 DIAGNOSIS — N186 End stage renal disease: Secondary | ICD-10-CM | POA: Diagnosis not present

## 2018-12-30 DIAGNOSIS — D631 Anemia in chronic kidney disease: Secondary | ICD-10-CM | POA: Diagnosis not present

## 2018-12-31 DIAGNOSIS — N186 End stage renal disease: Secondary | ICD-10-CM | POA: Diagnosis not present

## 2018-12-31 DIAGNOSIS — Z992 Dependence on renal dialysis: Secondary | ICD-10-CM | POA: Diagnosis not present

## 2019-01-01 DIAGNOSIS — E8809 Other disorders of plasma-protein metabolism, not elsewhere classified: Secondary | ICD-10-CM | POA: Diagnosis not present

## 2019-01-01 DIAGNOSIS — N186 End stage renal disease: Secondary | ICD-10-CM | POA: Diagnosis not present

## 2019-01-01 DIAGNOSIS — D500 Iron deficiency anemia secondary to blood loss (chronic): Secondary | ICD-10-CM | POA: Diagnosis not present

## 2019-01-01 DIAGNOSIS — Z992 Dependence on renal dialysis: Secondary | ICD-10-CM | POA: Diagnosis not present

## 2019-01-01 DIAGNOSIS — D631 Anemia in chronic kidney disease: Secondary | ICD-10-CM | POA: Diagnosis not present

## 2019-01-01 DIAGNOSIS — N2581 Secondary hyperparathyroidism of renal origin: Secondary | ICD-10-CM | POA: Diagnosis not present

## 2019-01-03 DIAGNOSIS — E8809 Other disorders of plasma-protein metabolism, not elsewhere classified: Secondary | ICD-10-CM | POA: Diagnosis not present

## 2019-01-03 DIAGNOSIS — Z992 Dependence on renal dialysis: Secondary | ICD-10-CM | POA: Diagnosis not present

## 2019-01-03 DIAGNOSIS — N186 End stage renal disease: Secondary | ICD-10-CM | POA: Diagnosis not present

## 2019-01-03 DIAGNOSIS — N2581 Secondary hyperparathyroidism of renal origin: Secondary | ICD-10-CM | POA: Diagnosis not present

## 2019-01-03 DIAGNOSIS — D631 Anemia in chronic kidney disease: Secondary | ICD-10-CM | POA: Diagnosis not present

## 2019-01-03 DIAGNOSIS — D500 Iron deficiency anemia secondary to blood loss (chronic): Secondary | ICD-10-CM | POA: Diagnosis not present

## 2019-01-06 DIAGNOSIS — D631 Anemia in chronic kidney disease: Secondary | ICD-10-CM | POA: Diagnosis not present

## 2019-01-06 DIAGNOSIS — D500 Iron deficiency anemia secondary to blood loss (chronic): Secondary | ICD-10-CM | POA: Diagnosis not present

## 2019-01-06 DIAGNOSIS — N2581 Secondary hyperparathyroidism of renal origin: Secondary | ICD-10-CM | POA: Diagnosis not present

## 2019-01-06 DIAGNOSIS — N186 End stage renal disease: Secondary | ICD-10-CM | POA: Diagnosis not present

## 2019-01-06 DIAGNOSIS — E8809 Other disorders of plasma-protein metabolism, not elsewhere classified: Secondary | ICD-10-CM | POA: Diagnosis not present

## 2019-01-06 DIAGNOSIS — Z992 Dependence on renal dialysis: Secondary | ICD-10-CM | POA: Diagnosis not present

## 2019-01-08 DIAGNOSIS — N2581 Secondary hyperparathyroidism of renal origin: Secondary | ICD-10-CM | POA: Diagnosis not present

## 2019-01-08 DIAGNOSIS — E8809 Other disorders of plasma-protein metabolism, not elsewhere classified: Secondary | ICD-10-CM | POA: Diagnosis not present

## 2019-01-08 DIAGNOSIS — D631 Anemia in chronic kidney disease: Secondary | ICD-10-CM | POA: Diagnosis not present

## 2019-01-08 DIAGNOSIS — Z992 Dependence on renal dialysis: Secondary | ICD-10-CM | POA: Diagnosis not present

## 2019-01-08 DIAGNOSIS — D500 Iron deficiency anemia secondary to blood loss (chronic): Secondary | ICD-10-CM | POA: Diagnosis not present

## 2019-01-08 DIAGNOSIS — N186 End stage renal disease: Secondary | ICD-10-CM | POA: Diagnosis not present

## 2019-01-10 DIAGNOSIS — N186 End stage renal disease: Secondary | ICD-10-CM | POA: Diagnosis not present

## 2019-01-10 DIAGNOSIS — D631 Anemia in chronic kidney disease: Secondary | ICD-10-CM | POA: Diagnosis not present

## 2019-01-10 DIAGNOSIS — Z992 Dependence on renal dialysis: Secondary | ICD-10-CM | POA: Diagnosis not present

## 2019-01-10 DIAGNOSIS — N2581 Secondary hyperparathyroidism of renal origin: Secondary | ICD-10-CM | POA: Diagnosis not present

## 2019-01-10 DIAGNOSIS — E8809 Other disorders of plasma-protein metabolism, not elsewhere classified: Secondary | ICD-10-CM | POA: Diagnosis not present

## 2019-01-10 DIAGNOSIS — D500 Iron deficiency anemia secondary to blood loss (chronic): Secondary | ICD-10-CM | POA: Diagnosis not present

## 2019-01-13 DIAGNOSIS — N186 End stage renal disease: Secondary | ICD-10-CM | POA: Diagnosis not present

## 2019-01-13 DIAGNOSIS — D500 Iron deficiency anemia secondary to blood loss (chronic): Secondary | ICD-10-CM | POA: Diagnosis not present

## 2019-01-13 DIAGNOSIS — Z992 Dependence on renal dialysis: Secondary | ICD-10-CM | POA: Diagnosis not present

## 2019-01-13 DIAGNOSIS — E8809 Other disorders of plasma-protein metabolism, not elsewhere classified: Secondary | ICD-10-CM | POA: Diagnosis not present

## 2019-01-13 DIAGNOSIS — D631 Anemia in chronic kidney disease: Secondary | ICD-10-CM | POA: Diagnosis not present

## 2019-01-13 DIAGNOSIS — N2581 Secondary hyperparathyroidism of renal origin: Secondary | ICD-10-CM | POA: Diagnosis not present

## 2019-01-15 DIAGNOSIS — N186 End stage renal disease: Secondary | ICD-10-CM | POA: Diagnosis not present

## 2019-01-15 DIAGNOSIS — N2581 Secondary hyperparathyroidism of renal origin: Secondary | ICD-10-CM | POA: Diagnosis not present

## 2019-01-15 DIAGNOSIS — Z992 Dependence on renal dialysis: Secondary | ICD-10-CM | POA: Diagnosis not present

## 2019-01-15 DIAGNOSIS — D500 Iron deficiency anemia secondary to blood loss (chronic): Secondary | ICD-10-CM | POA: Diagnosis not present

## 2019-01-15 DIAGNOSIS — D631 Anemia in chronic kidney disease: Secondary | ICD-10-CM | POA: Diagnosis not present

## 2019-01-15 DIAGNOSIS — E8809 Other disorders of plasma-protein metabolism, not elsewhere classified: Secondary | ICD-10-CM | POA: Diagnosis not present

## 2019-01-17 DIAGNOSIS — D631 Anemia in chronic kidney disease: Secondary | ICD-10-CM | POA: Diagnosis not present

## 2019-01-17 DIAGNOSIS — E8809 Other disorders of plasma-protein metabolism, not elsewhere classified: Secondary | ICD-10-CM | POA: Diagnosis not present

## 2019-01-17 DIAGNOSIS — Z992 Dependence on renal dialysis: Secondary | ICD-10-CM | POA: Diagnosis not present

## 2019-01-17 DIAGNOSIS — N2581 Secondary hyperparathyroidism of renal origin: Secondary | ICD-10-CM | POA: Diagnosis not present

## 2019-01-17 DIAGNOSIS — N186 End stage renal disease: Secondary | ICD-10-CM | POA: Diagnosis not present

## 2019-01-17 DIAGNOSIS — D500 Iron deficiency anemia secondary to blood loss (chronic): Secondary | ICD-10-CM | POA: Diagnosis not present

## 2019-01-20 DIAGNOSIS — D500 Iron deficiency anemia secondary to blood loss (chronic): Secondary | ICD-10-CM | POA: Diagnosis not present

## 2019-01-20 DIAGNOSIS — Z992 Dependence on renal dialysis: Secondary | ICD-10-CM | POA: Diagnosis not present

## 2019-01-20 DIAGNOSIS — N2581 Secondary hyperparathyroidism of renal origin: Secondary | ICD-10-CM | POA: Diagnosis not present

## 2019-01-20 DIAGNOSIS — E8809 Other disorders of plasma-protein metabolism, not elsewhere classified: Secondary | ICD-10-CM | POA: Diagnosis not present

## 2019-01-20 DIAGNOSIS — N186 End stage renal disease: Secondary | ICD-10-CM | POA: Diagnosis not present

## 2019-01-20 DIAGNOSIS — D631 Anemia in chronic kidney disease: Secondary | ICD-10-CM | POA: Diagnosis not present

## 2019-01-22 DIAGNOSIS — N2581 Secondary hyperparathyroidism of renal origin: Secondary | ICD-10-CM | POA: Diagnosis not present

## 2019-01-22 DIAGNOSIS — D500 Iron deficiency anemia secondary to blood loss (chronic): Secondary | ICD-10-CM | POA: Diagnosis not present

## 2019-01-22 DIAGNOSIS — D631 Anemia in chronic kidney disease: Secondary | ICD-10-CM | POA: Diagnosis not present

## 2019-01-22 DIAGNOSIS — E8809 Other disorders of plasma-protein metabolism, not elsewhere classified: Secondary | ICD-10-CM | POA: Diagnosis not present

## 2019-01-22 DIAGNOSIS — Z992 Dependence on renal dialysis: Secondary | ICD-10-CM | POA: Diagnosis not present

## 2019-01-22 DIAGNOSIS — N186 End stage renal disease: Secondary | ICD-10-CM | POA: Diagnosis not present

## 2019-01-24 DIAGNOSIS — N2581 Secondary hyperparathyroidism of renal origin: Secondary | ICD-10-CM | POA: Diagnosis not present

## 2019-01-24 DIAGNOSIS — D631 Anemia in chronic kidney disease: Secondary | ICD-10-CM | POA: Diagnosis not present

## 2019-01-24 DIAGNOSIS — E8809 Other disorders of plasma-protein metabolism, not elsewhere classified: Secondary | ICD-10-CM | POA: Diagnosis not present

## 2019-01-24 DIAGNOSIS — Z992 Dependence on renal dialysis: Secondary | ICD-10-CM | POA: Diagnosis not present

## 2019-01-24 DIAGNOSIS — D500 Iron deficiency anemia secondary to blood loss (chronic): Secondary | ICD-10-CM | POA: Diagnosis not present

## 2019-01-24 DIAGNOSIS — N186 End stage renal disease: Secondary | ICD-10-CM | POA: Diagnosis not present

## 2019-01-27 DIAGNOSIS — N186 End stage renal disease: Secondary | ICD-10-CM | POA: Diagnosis not present

## 2019-01-27 DIAGNOSIS — E8809 Other disorders of plasma-protein metabolism, not elsewhere classified: Secondary | ICD-10-CM | POA: Diagnosis not present

## 2019-01-27 DIAGNOSIS — D631 Anemia in chronic kidney disease: Secondary | ICD-10-CM | POA: Diagnosis not present

## 2019-01-27 DIAGNOSIS — N2581 Secondary hyperparathyroidism of renal origin: Secondary | ICD-10-CM | POA: Diagnosis not present

## 2019-01-27 DIAGNOSIS — D500 Iron deficiency anemia secondary to blood loss (chronic): Secondary | ICD-10-CM | POA: Diagnosis not present

## 2019-01-27 DIAGNOSIS — Z992 Dependence on renal dialysis: Secondary | ICD-10-CM | POA: Diagnosis not present

## 2019-01-29 DIAGNOSIS — I10 Essential (primary) hypertension: Secondary | ICD-10-CM | POA: Diagnosis not present

## 2019-01-29 DIAGNOSIS — E1122 Type 2 diabetes mellitus with diabetic chronic kidney disease: Secondary | ICD-10-CM | POA: Diagnosis not present

## 2019-01-29 DIAGNOSIS — Z992 Dependence on renal dialysis: Secondary | ICD-10-CM | POA: Diagnosis not present

## 2019-01-29 DIAGNOSIS — I120 Hypertensive chronic kidney disease with stage 5 chronic kidney disease or end stage renal disease: Secondary | ICD-10-CM | POA: Diagnosis not present

## 2019-01-29 DIAGNOSIS — N186 End stage renal disease: Secondary | ICD-10-CM | POA: Diagnosis not present

## 2019-01-29 DIAGNOSIS — R509 Fever, unspecified: Secondary | ICD-10-CM | POA: Diagnosis not present

## 2019-01-29 DIAGNOSIS — Z794 Long term (current) use of insulin: Secondary | ICD-10-CM | POA: Diagnosis not present

## 2019-01-29 DIAGNOSIS — F17200 Nicotine dependence, unspecified, uncomplicated: Secondary | ICD-10-CM | POA: Diagnosis not present

## 2019-01-29 DIAGNOSIS — Z20828 Contact with and (suspected) exposure to other viral communicable diseases: Secondary | ICD-10-CM | POA: Diagnosis not present

## 2019-01-30 DIAGNOSIS — E119 Type 2 diabetes mellitus without complications: Secondary | ICD-10-CM | POA: Diagnosis not present

## 2019-01-30 DIAGNOSIS — I10 Essential (primary) hypertension: Secondary | ICD-10-CM | POA: Diagnosis not present

## 2019-01-31 DIAGNOSIS — Z992 Dependence on renal dialysis: Secondary | ICD-10-CM | POA: Diagnosis not present

## 2019-01-31 DIAGNOSIS — E8809 Other disorders of plasma-protein metabolism, not elsewhere classified: Secondary | ICD-10-CM | POA: Diagnosis not present

## 2019-01-31 DIAGNOSIS — N186 End stage renal disease: Secondary | ICD-10-CM | POA: Diagnosis not present

## 2019-01-31 DIAGNOSIS — D631 Anemia in chronic kidney disease: Secondary | ICD-10-CM | POA: Diagnosis not present

## 2019-01-31 DIAGNOSIS — N2581 Secondary hyperparathyroidism of renal origin: Secondary | ICD-10-CM | POA: Diagnosis not present

## 2019-01-31 DIAGNOSIS — D500 Iron deficiency anemia secondary to blood loss (chronic): Secondary | ICD-10-CM | POA: Diagnosis not present

## 2019-02-03 DIAGNOSIS — N2581 Secondary hyperparathyroidism of renal origin: Secondary | ICD-10-CM | POA: Diagnosis not present

## 2019-02-03 DIAGNOSIS — N186 End stage renal disease: Secondary | ICD-10-CM | POA: Diagnosis not present

## 2019-02-03 DIAGNOSIS — Z992 Dependence on renal dialysis: Secondary | ICD-10-CM | POA: Diagnosis not present

## 2019-02-03 DIAGNOSIS — D500 Iron deficiency anemia secondary to blood loss (chronic): Secondary | ICD-10-CM | POA: Diagnosis not present

## 2019-02-03 DIAGNOSIS — D631 Anemia in chronic kidney disease: Secondary | ICD-10-CM | POA: Diagnosis not present

## 2019-02-03 DIAGNOSIS — E8809 Other disorders of plasma-protein metabolism, not elsewhere classified: Secondary | ICD-10-CM | POA: Diagnosis not present

## 2019-02-05 DIAGNOSIS — Z992 Dependence on renal dialysis: Secondary | ICD-10-CM | POA: Diagnosis not present

## 2019-02-05 DIAGNOSIS — D500 Iron deficiency anemia secondary to blood loss (chronic): Secondary | ICD-10-CM | POA: Diagnosis not present

## 2019-02-05 DIAGNOSIS — N2581 Secondary hyperparathyroidism of renal origin: Secondary | ICD-10-CM | POA: Diagnosis not present

## 2019-02-05 DIAGNOSIS — N186 End stage renal disease: Secondary | ICD-10-CM | POA: Diagnosis not present

## 2019-02-05 DIAGNOSIS — D631 Anemia in chronic kidney disease: Secondary | ICD-10-CM | POA: Diagnosis not present

## 2019-02-07 DIAGNOSIS — Z992 Dependence on renal dialysis: Secondary | ICD-10-CM | POA: Diagnosis not present

## 2019-02-07 DIAGNOSIS — N2581 Secondary hyperparathyroidism of renal origin: Secondary | ICD-10-CM | POA: Diagnosis not present

## 2019-02-07 DIAGNOSIS — D631 Anemia in chronic kidney disease: Secondary | ICD-10-CM | POA: Diagnosis not present

## 2019-02-07 DIAGNOSIS — D500 Iron deficiency anemia secondary to blood loss (chronic): Secondary | ICD-10-CM | POA: Diagnosis not present

## 2019-02-07 DIAGNOSIS — N186 End stage renal disease: Secondary | ICD-10-CM | POA: Diagnosis not present

## 2019-02-10 DIAGNOSIS — N2581 Secondary hyperparathyroidism of renal origin: Secondary | ICD-10-CM | POA: Diagnosis not present

## 2019-02-10 DIAGNOSIS — D631 Anemia in chronic kidney disease: Secondary | ICD-10-CM | POA: Diagnosis not present

## 2019-02-10 DIAGNOSIS — N186 End stage renal disease: Secondary | ICD-10-CM | POA: Diagnosis not present

## 2019-02-10 DIAGNOSIS — D500 Iron deficiency anemia secondary to blood loss (chronic): Secondary | ICD-10-CM | POA: Diagnosis not present

## 2019-02-10 DIAGNOSIS — Z992 Dependence on renal dialysis: Secondary | ICD-10-CM | POA: Diagnosis not present

## 2019-02-12 DIAGNOSIS — D500 Iron deficiency anemia secondary to blood loss (chronic): Secondary | ICD-10-CM | POA: Diagnosis not present

## 2019-02-12 DIAGNOSIS — N2581 Secondary hyperparathyroidism of renal origin: Secondary | ICD-10-CM | POA: Diagnosis not present

## 2019-02-12 DIAGNOSIS — N186 End stage renal disease: Secondary | ICD-10-CM | POA: Diagnosis not present

## 2019-02-12 DIAGNOSIS — Z992 Dependence on renal dialysis: Secondary | ICD-10-CM | POA: Diagnosis not present

## 2019-02-12 DIAGNOSIS — D631 Anemia in chronic kidney disease: Secondary | ICD-10-CM | POA: Diagnosis not present

## 2019-02-14 DIAGNOSIS — D500 Iron deficiency anemia secondary to blood loss (chronic): Secondary | ICD-10-CM | POA: Diagnosis not present

## 2019-02-14 DIAGNOSIS — Z992 Dependence on renal dialysis: Secondary | ICD-10-CM | POA: Diagnosis not present

## 2019-02-14 DIAGNOSIS — D631 Anemia in chronic kidney disease: Secondary | ICD-10-CM | POA: Diagnosis not present

## 2019-02-14 DIAGNOSIS — N186 End stage renal disease: Secondary | ICD-10-CM | POA: Diagnosis not present

## 2019-02-14 DIAGNOSIS — N2581 Secondary hyperparathyroidism of renal origin: Secondary | ICD-10-CM | POA: Diagnosis not present

## 2019-02-17 DIAGNOSIS — D631 Anemia in chronic kidney disease: Secondary | ICD-10-CM | POA: Diagnosis not present

## 2019-02-17 DIAGNOSIS — Z992 Dependence on renal dialysis: Secondary | ICD-10-CM | POA: Diagnosis not present

## 2019-02-17 DIAGNOSIS — N2581 Secondary hyperparathyroidism of renal origin: Secondary | ICD-10-CM | POA: Diagnosis not present

## 2019-02-17 DIAGNOSIS — N186 End stage renal disease: Secondary | ICD-10-CM | POA: Diagnosis not present

## 2019-02-17 DIAGNOSIS — D500 Iron deficiency anemia secondary to blood loss (chronic): Secondary | ICD-10-CM | POA: Diagnosis not present

## 2019-02-19 DIAGNOSIS — D631 Anemia in chronic kidney disease: Secondary | ICD-10-CM | POA: Diagnosis not present

## 2019-02-19 DIAGNOSIS — D500 Iron deficiency anemia secondary to blood loss (chronic): Secondary | ICD-10-CM | POA: Diagnosis not present

## 2019-02-19 DIAGNOSIS — N186 End stage renal disease: Secondary | ICD-10-CM | POA: Diagnosis not present

## 2019-02-19 DIAGNOSIS — Z992 Dependence on renal dialysis: Secondary | ICD-10-CM | POA: Diagnosis not present

## 2019-02-19 DIAGNOSIS — N2581 Secondary hyperparathyroidism of renal origin: Secondary | ICD-10-CM | POA: Diagnosis not present

## 2019-02-21 DIAGNOSIS — N186 End stage renal disease: Secondary | ICD-10-CM | POA: Diagnosis not present

## 2019-02-21 DIAGNOSIS — D500 Iron deficiency anemia secondary to blood loss (chronic): Secondary | ICD-10-CM | POA: Diagnosis not present

## 2019-02-21 DIAGNOSIS — D631 Anemia in chronic kidney disease: Secondary | ICD-10-CM | POA: Diagnosis not present

## 2019-02-21 DIAGNOSIS — N2581 Secondary hyperparathyroidism of renal origin: Secondary | ICD-10-CM | POA: Diagnosis not present

## 2019-02-21 DIAGNOSIS — Z992 Dependence on renal dialysis: Secondary | ICD-10-CM | POA: Diagnosis not present

## 2019-02-23 DIAGNOSIS — Z992 Dependence on renal dialysis: Secondary | ICD-10-CM | POA: Diagnosis not present

## 2019-02-23 DIAGNOSIS — D500 Iron deficiency anemia secondary to blood loss (chronic): Secondary | ICD-10-CM | POA: Diagnosis not present

## 2019-02-23 DIAGNOSIS — N2581 Secondary hyperparathyroidism of renal origin: Secondary | ICD-10-CM | POA: Diagnosis not present

## 2019-02-23 DIAGNOSIS — N186 End stage renal disease: Secondary | ICD-10-CM | POA: Diagnosis not present

## 2019-02-23 DIAGNOSIS — D631 Anemia in chronic kidney disease: Secondary | ICD-10-CM | POA: Diagnosis not present

## 2019-02-25 DIAGNOSIS — Z992 Dependence on renal dialysis: Secondary | ICD-10-CM | POA: Diagnosis not present

## 2019-02-25 DIAGNOSIS — D500 Iron deficiency anemia secondary to blood loss (chronic): Secondary | ICD-10-CM | POA: Diagnosis not present

## 2019-02-25 DIAGNOSIS — D631 Anemia in chronic kidney disease: Secondary | ICD-10-CM | POA: Diagnosis not present

## 2019-02-25 DIAGNOSIS — N186 End stage renal disease: Secondary | ICD-10-CM | POA: Diagnosis not present

## 2019-02-25 DIAGNOSIS — N2581 Secondary hyperparathyroidism of renal origin: Secondary | ICD-10-CM | POA: Diagnosis not present

## 2019-02-28 DIAGNOSIS — D500 Iron deficiency anemia secondary to blood loss (chronic): Secondary | ICD-10-CM | POA: Diagnosis not present

## 2019-02-28 DIAGNOSIS — N186 End stage renal disease: Secondary | ICD-10-CM | POA: Diagnosis not present

## 2019-02-28 DIAGNOSIS — D631 Anemia in chronic kidney disease: Secondary | ICD-10-CM | POA: Diagnosis not present

## 2019-02-28 DIAGNOSIS — N2581 Secondary hyperparathyroidism of renal origin: Secondary | ICD-10-CM | POA: Diagnosis not present

## 2019-02-28 DIAGNOSIS — Z992 Dependence on renal dialysis: Secondary | ICD-10-CM | POA: Diagnosis not present

## 2019-03-02 DIAGNOSIS — N186 End stage renal disease: Secondary | ICD-10-CM | POA: Diagnosis not present

## 2019-03-02 DIAGNOSIS — Z992 Dependence on renal dialysis: Secondary | ICD-10-CM | POA: Diagnosis not present

## 2019-03-04 DIAGNOSIS — E7800 Pure hypercholesterolemia, unspecified: Secondary | ICD-10-CM | POA: Diagnosis not present

## 2019-03-04 DIAGNOSIS — R5383 Other fatigue: Secondary | ICD-10-CM | POA: Diagnosis not present

## 2019-03-04 DIAGNOSIS — Z125 Encounter for screening for malignant neoplasm of prostate: Secondary | ICD-10-CM | POA: Diagnosis not present

## 2019-03-04 DIAGNOSIS — Z79899 Other long term (current) drug therapy: Secondary | ICD-10-CM | POA: Diagnosis not present

## 2019-03-12 DIAGNOSIS — E1129 Type 2 diabetes mellitus with other diabetic kidney complication: Secondary | ICD-10-CM | POA: Diagnosis not present

## 2019-03-16 DIAGNOSIS — E119 Type 2 diabetes mellitus without complications: Secondary | ICD-10-CM | POA: Diagnosis not present

## 2019-03-16 DIAGNOSIS — I10 Essential (primary) hypertension: Secondary | ICD-10-CM | POA: Diagnosis not present

## 2019-04-05 DIAGNOSIS — N186 End stage renal disease: Secondary | ICD-10-CM | POA: Diagnosis not present

## 2019-04-05 DIAGNOSIS — D500 Iron deficiency anemia secondary to blood loss (chronic): Secondary | ICD-10-CM | POA: Diagnosis not present

## 2019-04-05 DIAGNOSIS — N2581 Secondary hyperparathyroidism of renal origin: Secondary | ICD-10-CM | POA: Diagnosis not present

## 2019-04-05 DIAGNOSIS — E8809 Other disorders of plasma-protein metabolism, not elsewhere classified: Secondary | ICD-10-CM | POA: Diagnosis not present

## 2019-04-05 DIAGNOSIS — Z992 Dependence on renal dialysis: Secondary | ICD-10-CM | POA: Diagnosis not present

## 2019-04-05 DIAGNOSIS — D631 Anemia in chronic kidney disease: Secondary | ICD-10-CM | POA: Diagnosis not present

## 2019-04-07 DIAGNOSIS — E8809 Other disorders of plasma-protein metabolism, not elsewhere classified: Secondary | ICD-10-CM | POA: Diagnosis not present

## 2019-04-07 DIAGNOSIS — D631 Anemia in chronic kidney disease: Secondary | ICD-10-CM | POA: Diagnosis not present

## 2019-04-07 DIAGNOSIS — Z992 Dependence on renal dialysis: Secondary | ICD-10-CM | POA: Diagnosis not present

## 2019-04-07 DIAGNOSIS — D500 Iron deficiency anemia secondary to blood loss (chronic): Secondary | ICD-10-CM | POA: Diagnosis not present

## 2019-04-07 DIAGNOSIS — N2581 Secondary hyperparathyroidism of renal origin: Secondary | ICD-10-CM | POA: Diagnosis not present

## 2019-04-07 DIAGNOSIS — N186 End stage renal disease: Secondary | ICD-10-CM | POA: Diagnosis not present

## 2019-04-09 DIAGNOSIS — E8809 Other disorders of plasma-protein metabolism, not elsewhere classified: Secondary | ICD-10-CM | POA: Diagnosis not present

## 2019-04-09 DIAGNOSIS — N2581 Secondary hyperparathyroidism of renal origin: Secondary | ICD-10-CM | POA: Diagnosis not present

## 2019-04-09 DIAGNOSIS — D500 Iron deficiency anemia secondary to blood loss (chronic): Secondary | ICD-10-CM | POA: Diagnosis not present

## 2019-04-09 DIAGNOSIS — D631 Anemia in chronic kidney disease: Secondary | ICD-10-CM | POA: Diagnosis not present

## 2019-04-09 DIAGNOSIS — N186 End stage renal disease: Secondary | ICD-10-CM | POA: Diagnosis not present

## 2019-04-09 DIAGNOSIS — Z992 Dependence on renal dialysis: Secondary | ICD-10-CM | POA: Diagnosis not present

## 2019-04-11 DIAGNOSIS — N2581 Secondary hyperparathyroidism of renal origin: Secondary | ICD-10-CM | POA: Diagnosis not present

## 2019-04-11 DIAGNOSIS — E8809 Other disorders of plasma-protein metabolism, not elsewhere classified: Secondary | ICD-10-CM | POA: Diagnosis not present

## 2019-04-11 DIAGNOSIS — D631 Anemia in chronic kidney disease: Secondary | ICD-10-CM | POA: Diagnosis not present

## 2019-04-11 DIAGNOSIS — Z992 Dependence on renal dialysis: Secondary | ICD-10-CM | POA: Diagnosis not present

## 2019-04-11 DIAGNOSIS — D500 Iron deficiency anemia secondary to blood loss (chronic): Secondary | ICD-10-CM | POA: Diagnosis not present

## 2019-04-11 DIAGNOSIS — N186 End stage renal disease: Secondary | ICD-10-CM | POA: Diagnosis not present

## 2019-04-14 DIAGNOSIS — Z992 Dependence on renal dialysis: Secondary | ICD-10-CM | POA: Diagnosis not present

## 2019-04-14 DIAGNOSIS — E8809 Other disorders of plasma-protein metabolism, not elsewhere classified: Secondary | ICD-10-CM | POA: Diagnosis not present

## 2019-04-14 DIAGNOSIS — D500 Iron deficiency anemia secondary to blood loss (chronic): Secondary | ICD-10-CM | POA: Diagnosis not present

## 2019-04-14 DIAGNOSIS — N2581 Secondary hyperparathyroidism of renal origin: Secondary | ICD-10-CM | POA: Diagnosis not present

## 2019-04-14 DIAGNOSIS — D631 Anemia in chronic kidney disease: Secondary | ICD-10-CM | POA: Diagnosis not present

## 2019-04-14 DIAGNOSIS — N186 End stage renal disease: Secondary | ICD-10-CM | POA: Diagnosis not present

## 2019-04-16 DIAGNOSIS — N186 End stage renal disease: Secondary | ICD-10-CM | POA: Diagnosis not present

## 2019-04-16 DIAGNOSIS — D500 Iron deficiency anemia secondary to blood loss (chronic): Secondary | ICD-10-CM | POA: Diagnosis not present

## 2019-04-16 DIAGNOSIS — D631 Anemia in chronic kidney disease: Secondary | ICD-10-CM | POA: Diagnosis not present

## 2019-04-16 DIAGNOSIS — E8809 Other disorders of plasma-protein metabolism, not elsewhere classified: Secondary | ICD-10-CM | POA: Diagnosis not present

## 2019-04-16 DIAGNOSIS — N2581 Secondary hyperparathyroidism of renal origin: Secondary | ICD-10-CM | POA: Diagnosis not present

## 2019-04-16 DIAGNOSIS — Z992 Dependence on renal dialysis: Secondary | ICD-10-CM | POA: Diagnosis not present

## 2019-04-18 DIAGNOSIS — D631 Anemia in chronic kidney disease: Secondary | ICD-10-CM | POA: Diagnosis not present

## 2019-04-18 DIAGNOSIS — E8809 Other disorders of plasma-protein metabolism, not elsewhere classified: Secondary | ICD-10-CM | POA: Diagnosis not present

## 2019-04-18 DIAGNOSIS — N2581 Secondary hyperparathyroidism of renal origin: Secondary | ICD-10-CM | POA: Diagnosis not present

## 2019-04-18 DIAGNOSIS — Z992 Dependence on renal dialysis: Secondary | ICD-10-CM | POA: Diagnosis not present

## 2019-04-18 DIAGNOSIS — D500 Iron deficiency anemia secondary to blood loss (chronic): Secondary | ICD-10-CM | POA: Diagnosis not present

## 2019-04-18 DIAGNOSIS — N186 End stage renal disease: Secondary | ICD-10-CM | POA: Diagnosis not present

## 2019-04-21 DIAGNOSIS — N186 End stage renal disease: Secondary | ICD-10-CM | POA: Diagnosis not present

## 2019-04-21 DIAGNOSIS — D631 Anemia in chronic kidney disease: Secondary | ICD-10-CM | POA: Diagnosis not present

## 2019-04-21 DIAGNOSIS — D500 Iron deficiency anemia secondary to blood loss (chronic): Secondary | ICD-10-CM | POA: Diagnosis not present

## 2019-04-21 DIAGNOSIS — Z992 Dependence on renal dialysis: Secondary | ICD-10-CM | POA: Diagnosis not present

## 2019-04-21 DIAGNOSIS — E8809 Other disorders of plasma-protein metabolism, not elsewhere classified: Secondary | ICD-10-CM | POA: Diagnosis not present

## 2019-04-21 DIAGNOSIS — N2581 Secondary hyperparathyroidism of renal origin: Secondary | ICD-10-CM | POA: Diagnosis not present

## 2019-04-24 DIAGNOSIS — N186 End stage renal disease: Secondary | ICD-10-CM | POA: Diagnosis not present

## 2019-04-24 DIAGNOSIS — E8809 Other disorders of plasma-protein metabolism, not elsewhere classified: Secondary | ICD-10-CM | POA: Diagnosis not present

## 2019-04-24 DIAGNOSIS — N2581 Secondary hyperparathyroidism of renal origin: Secondary | ICD-10-CM | POA: Diagnosis not present

## 2019-04-24 DIAGNOSIS — Z992 Dependence on renal dialysis: Secondary | ICD-10-CM | POA: Diagnosis not present

## 2019-04-24 DIAGNOSIS — D631 Anemia in chronic kidney disease: Secondary | ICD-10-CM | POA: Diagnosis not present

## 2019-04-24 DIAGNOSIS — D500 Iron deficiency anemia secondary to blood loss (chronic): Secondary | ICD-10-CM | POA: Diagnosis not present

## 2019-04-29 DIAGNOSIS — N2581 Secondary hyperparathyroidism of renal origin: Secondary | ICD-10-CM | POA: Diagnosis not present

## 2019-04-29 DIAGNOSIS — N186 End stage renal disease: Secondary | ICD-10-CM | POA: Diagnosis not present

## 2019-04-29 DIAGNOSIS — E8809 Other disorders of plasma-protein metabolism, not elsewhere classified: Secondary | ICD-10-CM | POA: Diagnosis not present

## 2019-04-29 DIAGNOSIS — D500 Iron deficiency anemia secondary to blood loss (chronic): Secondary | ICD-10-CM | POA: Diagnosis not present

## 2019-04-29 DIAGNOSIS — D631 Anemia in chronic kidney disease: Secondary | ICD-10-CM | POA: Diagnosis not present

## 2019-04-29 DIAGNOSIS — Z992 Dependence on renal dialysis: Secondary | ICD-10-CM | POA: Diagnosis not present

## 2019-05-01 DIAGNOSIS — N186 End stage renal disease: Secondary | ICD-10-CM | POA: Diagnosis not present

## 2019-05-01 DIAGNOSIS — D500 Iron deficiency anemia secondary to blood loss (chronic): Secondary | ICD-10-CM | POA: Diagnosis not present

## 2019-05-01 DIAGNOSIS — Z992 Dependence on renal dialysis: Secondary | ICD-10-CM | POA: Diagnosis not present

## 2019-05-01 DIAGNOSIS — D631 Anemia in chronic kidney disease: Secondary | ICD-10-CM | POA: Diagnosis not present

## 2019-05-01 DIAGNOSIS — N2581 Secondary hyperparathyroidism of renal origin: Secondary | ICD-10-CM | POA: Diagnosis not present

## 2019-05-01 DIAGNOSIS — E8809 Other disorders of plasma-protein metabolism, not elsewhere classified: Secondary | ICD-10-CM | POA: Diagnosis not present

## 2019-05-03 DIAGNOSIS — W000XXA Fall on same level due to ice and snow, initial encounter: Secondary | ICD-10-CM | POA: Diagnosis not present

## 2019-05-03 DIAGNOSIS — W1830XA Fall on same level, unspecified, initial encounter: Secondary | ICD-10-CM | POA: Diagnosis not present

## 2019-05-03 DIAGNOSIS — S0083XA Contusion of other part of head, initial encounter: Secondary | ICD-10-CM | POA: Diagnosis not present

## 2019-05-03 DIAGNOSIS — Z992 Dependence on renal dialysis: Secondary | ICD-10-CM | POA: Diagnosis not present

## 2019-05-03 DIAGNOSIS — I120 Hypertensive chronic kidney disease with stage 5 chronic kidney disease or end stage renal disease: Secondary | ICD-10-CM | POA: Diagnosis not present

## 2019-05-03 DIAGNOSIS — S63591A Other specified sprain of right wrist, initial encounter: Secondary | ICD-10-CM | POA: Diagnosis not present

## 2019-05-03 DIAGNOSIS — S0990XA Unspecified injury of head, initial encounter: Secondary | ICD-10-CM | POA: Diagnosis not present

## 2019-05-03 DIAGNOSIS — Z7901 Long term (current) use of anticoagulants: Secondary | ICD-10-CM | POA: Diagnosis not present

## 2019-05-03 DIAGNOSIS — E1122 Type 2 diabetes mellitus with diabetic chronic kidney disease: Secondary | ICD-10-CM | POA: Diagnosis not present

## 2019-05-03 DIAGNOSIS — Y92481 Parking lot as the place of occurrence of the external cause: Secondary | ICD-10-CM | POA: Diagnosis not present

## 2019-05-03 DIAGNOSIS — N186 End stage renal disease: Secondary | ICD-10-CM | POA: Diagnosis not present

## 2019-05-03 DIAGNOSIS — Z794 Long term (current) use of insulin: Secondary | ICD-10-CM | POA: Diagnosis not present

## 2019-05-03 DIAGNOSIS — H938X2 Other specified disorders of left ear: Secondary | ICD-10-CM | POA: Diagnosis not present

## 2019-05-04 DIAGNOSIS — Z992 Dependence on renal dialysis: Secondary | ICD-10-CM | POA: Diagnosis not present

## 2019-05-04 DIAGNOSIS — N186 End stage renal disease: Secondary | ICD-10-CM | POA: Diagnosis not present

## 2019-05-04 DIAGNOSIS — D631 Anemia in chronic kidney disease: Secondary | ICD-10-CM | POA: Diagnosis not present

## 2019-05-04 DIAGNOSIS — E8809 Other disorders of plasma-protein metabolism, not elsewhere classified: Secondary | ICD-10-CM | POA: Diagnosis not present

## 2019-05-04 DIAGNOSIS — D500 Iron deficiency anemia secondary to blood loss (chronic): Secondary | ICD-10-CM | POA: Diagnosis not present

## 2019-05-04 DIAGNOSIS — N2581 Secondary hyperparathyroidism of renal origin: Secondary | ICD-10-CM | POA: Diagnosis not present

## 2019-05-06 DIAGNOSIS — D500 Iron deficiency anemia secondary to blood loss (chronic): Secondary | ICD-10-CM | POA: Diagnosis not present

## 2019-05-06 DIAGNOSIS — Z992 Dependence on renal dialysis: Secondary | ICD-10-CM | POA: Diagnosis not present

## 2019-05-06 DIAGNOSIS — D631 Anemia in chronic kidney disease: Secondary | ICD-10-CM | POA: Diagnosis not present

## 2019-05-06 DIAGNOSIS — N186 End stage renal disease: Secondary | ICD-10-CM | POA: Diagnosis not present

## 2019-05-06 DIAGNOSIS — N2581 Secondary hyperparathyroidism of renal origin: Secondary | ICD-10-CM | POA: Diagnosis not present

## 2019-05-08 DIAGNOSIS — N2581 Secondary hyperparathyroidism of renal origin: Secondary | ICD-10-CM | POA: Diagnosis not present

## 2019-05-08 DIAGNOSIS — D500 Iron deficiency anemia secondary to blood loss (chronic): Secondary | ICD-10-CM | POA: Diagnosis not present

## 2019-05-08 DIAGNOSIS — Z992 Dependence on renal dialysis: Secondary | ICD-10-CM | POA: Diagnosis not present

## 2019-05-08 DIAGNOSIS — D631 Anemia in chronic kidney disease: Secondary | ICD-10-CM | POA: Diagnosis not present

## 2019-05-08 DIAGNOSIS — N186 End stage renal disease: Secondary | ICD-10-CM | POA: Diagnosis not present

## 2019-05-11 DIAGNOSIS — D631 Anemia in chronic kidney disease: Secondary | ICD-10-CM | POA: Diagnosis not present

## 2019-05-11 DIAGNOSIS — N186 End stage renal disease: Secondary | ICD-10-CM | POA: Diagnosis not present

## 2019-05-11 DIAGNOSIS — D500 Iron deficiency anemia secondary to blood loss (chronic): Secondary | ICD-10-CM | POA: Diagnosis not present

## 2019-05-11 DIAGNOSIS — E119 Type 2 diabetes mellitus without complications: Secondary | ICD-10-CM | POA: Diagnosis not present

## 2019-05-11 DIAGNOSIS — I10 Essential (primary) hypertension: Secondary | ICD-10-CM | POA: Diagnosis not present

## 2019-05-11 DIAGNOSIS — Z992 Dependence on renal dialysis: Secondary | ICD-10-CM | POA: Diagnosis not present

## 2019-05-11 DIAGNOSIS — N2581 Secondary hyperparathyroidism of renal origin: Secondary | ICD-10-CM | POA: Diagnosis not present

## 2019-05-13 DIAGNOSIS — N186 End stage renal disease: Secondary | ICD-10-CM | POA: Diagnosis not present

## 2019-05-13 DIAGNOSIS — D631 Anemia in chronic kidney disease: Secondary | ICD-10-CM | POA: Diagnosis not present

## 2019-05-13 DIAGNOSIS — N2581 Secondary hyperparathyroidism of renal origin: Secondary | ICD-10-CM | POA: Diagnosis not present

## 2019-05-13 DIAGNOSIS — D500 Iron deficiency anemia secondary to blood loss (chronic): Secondary | ICD-10-CM | POA: Diagnosis not present

## 2019-05-13 DIAGNOSIS — Z992 Dependence on renal dialysis: Secondary | ICD-10-CM | POA: Diagnosis not present

## 2019-05-15 DIAGNOSIS — D631 Anemia in chronic kidney disease: Secondary | ICD-10-CM | POA: Diagnosis not present

## 2019-05-15 DIAGNOSIS — N2581 Secondary hyperparathyroidism of renal origin: Secondary | ICD-10-CM | POA: Diagnosis not present

## 2019-05-15 DIAGNOSIS — D500 Iron deficiency anemia secondary to blood loss (chronic): Secondary | ICD-10-CM | POA: Diagnosis not present

## 2019-05-15 DIAGNOSIS — N186 End stage renal disease: Secondary | ICD-10-CM | POA: Diagnosis not present

## 2019-05-15 DIAGNOSIS — Z992 Dependence on renal dialysis: Secondary | ICD-10-CM | POA: Diagnosis not present

## 2019-05-18 DIAGNOSIS — I10 Essential (primary) hypertension: Secondary | ICD-10-CM | POA: Diagnosis not present

## 2019-05-18 DIAGNOSIS — E1122 Type 2 diabetes mellitus with diabetic chronic kidney disease: Secondary | ICD-10-CM | POA: Diagnosis not present

## 2019-05-18 DIAGNOSIS — Z789 Other specified health status: Secondary | ICD-10-CM | POA: Diagnosis not present

## 2019-05-18 DIAGNOSIS — Z299 Encounter for prophylactic measures, unspecified: Secondary | ICD-10-CM | POA: Diagnosis not present

## 2019-05-18 DIAGNOSIS — E1165 Type 2 diabetes mellitus with hyperglycemia: Secondary | ICD-10-CM | POA: Diagnosis not present

## 2019-05-18 DIAGNOSIS — N186 End stage renal disease: Secondary | ICD-10-CM | POA: Diagnosis not present

## 2019-05-18 DIAGNOSIS — Z6824 Body mass index (BMI) 24.0-24.9, adult: Secondary | ICD-10-CM | POA: Diagnosis not present

## 2019-05-19 DIAGNOSIS — D500 Iron deficiency anemia secondary to blood loss (chronic): Secondary | ICD-10-CM | POA: Diagnosis not present

## 2019-05-19 DIAGNOSIS — D631 Anemia in chronic kidney disease: Secondary | ICD-10-CM | POA: Diagnosis not present

## 2019-05-19 DIAGNOSIS — N2581 Secondary hyperparathyroidism of renal origin: Secondary | ICD-10-CM | POA: Diagnosis not present

## 2019-05-19 DIAGNOSIS — N186 End stage renal disease: Secondary | ICD-10-CM | POA: Diagnosis not present

## 2019-05-19 DIAGNOSIS — Z992 Dependence on renal dialysis: Secondary | ICD-10-CM | POA: Diagnosis not present

## 2019-05-20 DIAGNOSIS — N2581 Secondary hyperparathyroidism of renal origin: Secondary | ICD-10-CM | POA: Diagnosis not present

## 2019-05-20 DIAGNOSIS — N186 End stage renal disease: Secondary | ICD-10-CM | POA: Diagnosis not present

## 2019-05-20 DIAGNOSIS — D631 Anemia in chronic kidney disease: Secondary | ICD-10-CM | POA: Diagnosis not present

## 2019-05-20 DIAGNOSIS — Z992 Dependence on renal dialysis: Secondary | ICD-10-CM | POA: Diagnosis not present

## 2019-05-20 DIAGNOSIS — D500 Iron deficiency anemia secondary to blood loss (chronic): Secondary | ICD-10-CM | POA: Diagnosis not present

## 2019-05-23 DIAGNOSIS — N186 End stage renal disease: Secondary | ICD-10-CM | POA: Diagnosis not present

## 2019-05-23 DIAGNOSIS — N2581 Secondary hyperparathyroidism of renal origin: Secondary | ICD-10-CM | POA: Diagnosis not present

## 2019-05-23 DIAGNOSIS — D631 Anemia in chronic kidney disease: Secondary | ICD-10-CM | POA: Diagnosis not present

## 2019-05-23 DIAGNOSIS — Z992 Dependence on renal dialysis: Secondary | ICD-10-CM | POA: Diagnosis not present

## 2019-05-23 DIAGNOSIS — D500 Iron deficiency anemia secondary to blood loss (chronic): Secondary | ICD-10-CM | POA: Diagnosis not present

## 2019-05-26 DIAGNOSIS — D500 Iron deficiency anemia secondary to blood loss (chronic): Secondary | ICD-10-CM | POA: Diagnosis not present

## 2019-05-26 DIAGNOSIS — N2581 Secondary hyperparathyroidism of renal origin: Secondary | ICD-10-CM | POA: Diagnosis not present

## 2019-05-26 DIAGNOSIS — D631 Anemia in chronic kidney disease: Secondary | ICD-10-CM | POA: Diagnosis not present

## 2019-05-26 DIAGNOSIS — Z992 Dependence on renal dialysis: Secondary | ICD-10-CM | POA: Diagnosis not present

## 2019-05-26 DIAGNOSIS — N186 End stage renal disease: Secondary | ICD-10-CM | POA: Diagnosis not present

## 2019-05-28 DIAGNOSIS — D500 Iron deficiency anemia secondary to blood loss (chronic): Secondary | ICD-10-CM | POA: Diagnosis not present

## 2019-05-28 DIAGNOSIS — D631 Anemia in chronic kidney disease: Secondary | ICD-10-CM | POA: Diagnosis not present

## 2019-05-28 DIAGNOSIS — N2581 Secondary hyperparathyroidism of renal origin: Secondary | ICD-10-CM | POA: Diagnosis not present

## 2019-05-28 DIAGNOSIS — Z992 Dependence on renal dialysis: Secondary | ICD-10-CM | POA: Diagnosis not present

## 2019-05-28 DIAGNOSIS — N186 End stage renal disease: Secondary | ICD-10-CM | POA: Diagnosis not present

## 2019-05-30 DIAGNOSIS — Z992 Dependence on renal dialysis: Secondary | ICD-10-CM | POA: Diagnosis not present

## 2019-05-30 DIAGNOSIS — D500 Iron deficiency anemia secondary to blood loss (chronic): Secondary | ICD-10-CM | POA: Diagnosis not present

## 2019-05-30 DIAGNOSIS — N2581 Secondary hyperparathyroidism of renal origin: Secondary | ICD-10-CM | POA: Diagnosis not present

## 2019-05-30 DIAGNOSIS — D631 Anemia in chronic kidney disease: Secondary | ICD-10-CM | POA: Diagnosis not present

## 2019-05-30 DIAGNOSIS — N186 End stage renal disease: Secondary | ICD-10-CM | POA: Diagnosis not present

## 2019-05-31 DIAGNOSIS — Z992 Dependence on renal dialysis: Secondary | ICD-10-CM | POA: Diagnosis not present

## 2019-05-31 DIAGNOSIS — N186 End stage renal disease: Secondary | ICD-10-CM | POA: Diagnosis not present

## 2019-06-02 DIAGNOSIS — Z992 Dependence on renal dialysis: Secondary | ICD-10-CM | POA: Diagnosis not present

## 2019-06-02 DIAGNOSIS — D631 Anemia in chronic kidney disease: Secondary | ICD-10-CM | POA: Diagnosis not present

## 2019-06-02 DIAGNOSIS — N186 End stage renal disease: Secondary | ICD-10-CM | POA: Diagnosis not present

## 2019-06-02 DIAGNOSIS — N2581 Secondary hyperparathyroidism of renal origin: Secondary | ICD-10-CM | POA: Diagnosis not present

## 2019-06-02 DIAGNOSIS — D500 Iron deficiency anemia secondary to blood loss (chronic): Secondary | ICD-10-CM | POA: Diagnosis not present

## 2019-06-02 DIAGNOSIS — D6959 Other secondary thrombocytopenia: Secondary | ICD-10-CM | POA: Diagnosis not present

## 2019-06-04 DIAGNOSIS — D500 Iron deficiency anemia secondary to blood loss (chronic): Secondary | ICD-10-CM | POA: Diagnosis not present

## 2019-06-04 DIAGNOSIS — N2581 Secondary hyperparathyroidism of renal origin: Secondary | ICD-10-CM | POA: Diagnosis not present

## 2019-06-04 DIAGNOSIS — Z992 Dependence on renal dialysis: Secondary | ICD-10-CM | POA: Diagnosis not present

## 2019-06-04 DIAGNOSIS — N186 End stage renal disease: Secondary | ICD-10-CM | POA: Diagnosis not present

## 2019-06-04 DIAGNOSIS — D631 Anemia in chronic kidney disease: Secondary | ICD-10-CM | POA: Diagnosis not present

## 2019-06-06 DIAGNOSIS — D631 Anemia in chronic kidney disease: Secondary | ICD-10-CM | POA: Diagnosis not present

## 2019-06-06 DIAGNOSIS — N186 End stage renal disease: Secondary | ICD-10-CM | POA: Diagnosis not present

## 2019-06-06 DIAGNOSIS — Z992 Dependence on renal dialysis: Secondary | ICD-10-CM | POA: Diagnosis not present

## 2019-06-06 DIAGNOSIS — N2581 Secondary hyperparathyroidism of renal origin: Secondary | ICD-10-CM | POA: Diagnosis not present

## 2019-06-06 DIAGNOSIS — D500 Iron deficiency anemia secondary to blood loss (chronic): Secondary | ICD-10-CM | POA: Diagnosis not present

## 2019-06-09 DIAGNOSIS — D631 Anemia in chronic kidney disease: Secondary | ICD-10-CM | POA: Diagnosis not present

## 2019-06-09 DIAGNOSIS — N186 End stage renal disease: Secondary | ICD-10-CM | POA: Diagnosis not present

## 2019-06-09 DIAGNOSIS — D500 Iron deficiency anemia secondary to blood loss (chronic): Secondary | ICD-10-CM | POA: Diagnosis not present

## 2019-06-09 DIAGNOSIS — N2581 Secondary hyperparathyroidism of renal origin: Secondary | ICD-10-CM | POA: Diagnosis not present

## 2019-06-09 DIAGNOSIS — Z992 Dependence on renal dialysis: Secondary | ICD-10-CM | POA: Diagnosis not present

## 2019-06-11 DIAGNOSIS — N2581 Secondary hyperparathyroidism of renal origin: Secondary | ICD-10-CM | POA: Diagnosis not present

## 2019-06-11 DIAGNOSIS — E1129 Type 2 diabetes mellitus with other diabetic kidney complication: Secondary | ICD-10-CM | POA: Diagnosis not present

## 2019-06-11 DIAGNOSIS — D500 Iron deficiency anemia secondary to blood loss (chronic): Secondary | ICD-10-CM | POA: Diagnosis not present

## 2019-06-11 DIAGNOSIS — D631 Anemia in chronic kidney disease: Secondary | ICD-10-CM | POA: Diagnosis not present

## 2019-06-11 DIAGNOSIS — N186 End stage renal disease: Secondary | ICD-10-CM | POA: Diagnosis not present

## 2019-06-11 DIAGNOSIS — Z992 Dependence on renal dialysis: Secondary | ICD-10-CM | POA: Diagnosis not present

## 2019-06-13 DIAGNOSIS — D500 Iron deficiency anemia secondary to blood loss (chronic): Secondary | ICD-10-CM | POA: Diagnosis not present

## 2019-06-13 DIAGNOSIS — Z992 Dependence on renal dialysis: Secondary | ICD-10-CM | POA: Diagnosis not present

## 2019-06-13 DIAGNOSIS — N186 End stage renal disease: Secondary | ICD-10-CM | POA: Diagnosis not present

## 2019-06-13 DIAGNOSIS — N2581 Secondary hyperparathyroidism of renal origin: Secondary | ICD-10-CM | POA: Diagnosis not present

## 2019-06-13 DIAGNOSIS — D631 Anemia in chronic kidney disease: Secondary | ICD-10-CM | POA: Diagnosis not present

## 2019-06-16 DIAGNOSIS — D500 Iron deficiency anemia secondary to blood loss (chronic): Secondary | ICD-10-CM | POA: Diagnosis not present

## 2019-06-16 DIAGNOSIS — Z992 Dependence on renal dialysis: Secondary | ICD-10-CM | POA: Diagnosis not present

## 2019-06-16 DIAGNOSIS — N2581 Secondary hyperparathyroidism of renal origin: Secondary | ICD-10-CM | POA: Diagnosis not present

## 2019-06-16 DIAGNOSIS — N186 End stage renal disease: Secondary | ICD-10-CM | POA: Diagnosis not present

## 2019-06-16 DIAGNOSIS — D631 Anemia in chronic kidney disease: Secondary | ICD-10-CM | POA: Diagnosis not present

## 2019-06-18 DIAGNOSIS — D631 Anemia in chronic kidney disease: Secondary | ICD-10-CM | POA: Diagnosis not present

## 2019-06-18 DIAGNOSIS — N186 End stage renal disease: Secondary | ICD-10-CM | POA: Diagnosis not present

## 2019-06-18 DIAGNOSIS — N2581 Secondary hyperparathyroidism of renal origin: Secondary | ICD-10-CM | POA: Diagnosis not present

## 2019-06-18 DIAGNOSIS — Z992 Dependence on renal dialysis: Secondary | ICD-10-CM | POA: Diagnosis not present

## 2019-06-18 DIAGNOSIS — D500 Iron deficiency anemia secondary to blood loss (chronic): Secondary | ICD-10-CM | POA: Diagnosis not present

## 2019-06-20 DIAGNOSIS — N186 End stage renal disease: Secondary | ICD-10-CM | POA: Diagnosis not present

## 2019-06-20 DIAGNOSIS — D631 Anemia in chronic kidney disease: Secondary | ICD-10-CM | POA: Diagnosis not present

## 2019-06-20 DIAGNOSIS — D500 Iron deficiency anemia secondary to blood loss (chronic): Secondary | ICD-10-CM | POA: Diagnosis not present

## 2019-06-20 DIAGNOSIS — Z992 Dependence on renal dialysis: Secondary | ICD-10-CM | POA: Diagnosis not present

## 2019-06-20 DIAGNOSIS — N2581 Secondary hyperparathyroidism of renal origin: Secondary | ICD-10-CM | POA: Diagnosis not present

## 2019-06-23 DIAGNOSIS — Z992 Dependence on renal dialysis: Secondary | ICD-10-CM | POA: Diagnosis not present

## 2019-06-23 DIAGNOSIS — N186 End stage renal disease: Secondary | ICD-10-CM | POA: Diagnosis not present

## 2019-06-23 DIAGNOSIS — D500 Iron deficiency anemia secondary to blood loss (chronic): Secondary | ICD-10-CM | POA: Diagnosis not present

## 2019-06-23 DIAGNOSIS — N2581 Secondary hyperparathyroidism of renal origin: Secondary | ICD-10-CM | POA: Diagnosis not present

## 2019-06-23 DIAGNOSIS — D631 Anemia in chronic kidney disease: Secondary | ICD-10-CM | POA: Diagnosis not present

## 2019-06-25 DIAGNOSIS — N2581 Secondary hyperparathyroidism of renal origin: Secondary | ICD-10-CM | POA: Diagnosis not present

## 2019-06-25 DIAGNOSIS — D631 Anemia in chronic kidney disease: Secondary | ICD-10-CM | POA: Diagnosis not present

## 2019-06-25 DIAGNOSIS — N186 End stage renal disease: Secondary | ICD-10-CM | POA: Diagnosis not present

## 2019-06-25 DIAGNOSIS — Z992 Dependence on renal dialysis: Secondary | ICD-10-CM | POA: Diagnosis not present

## 2019-06-25 DIAGNOSIS — D500 Iron deficiency anemia secondary to blood loss (chronic): Secondary | ICD-10-CM | POA: Diagnosis not present

## 2019-06-27 DIAGNOSIS — D631 Anemia in chronic kidney disease: Secondary | ICD-10-CM | POA: Diagnosis not present

## 2019-06-27 DIAGNOSIS — N186 End stage renal disease: Secondary | ICD-10-CM | POA: Diagnosis not present

## 2019-06-27 DIAGNOSIS — Z992 Dependence on renal dialysis: Secondary | ICD-10-CM | POA: Diagnosis not present

## 2019-06-27 DIAGNOSIS — N2581 Secondary hyperparathyroidism of renal origin: Secondary | ICD-10-CM | POA: Diagnosis not present

## 2019-06-27 DIAGNOSIS — D500 Iron deficiency anemia secondary to blood loss (chronic): Secondary | ICD-10-CM | POA: Diagnosis not present

## 2019-06-30 DIAGNOSIS — Z992 Dependence on renal dialysis: Secondary | ICD-10-CM | POA: Diagnosis not present

## 2019-06-30 DIAGNOSIS — D631 Anemia in chronic kidney disease: Secondary | ICD-10-CM | POA: Diagnosis not present

## 2019-06-30 DIAGNOSIS — N2581 Secondary hyperparathyroidism of renal origin: Secondary | ICD-10-CM | POA: Diagnosis not present

## 2019-06-30 DIAGNOSIS — N186 End stage renal disease: Secondary | ICD-10-CM | POA: Diagnosis not present

## 2019-06-30 DIAGNOSIS — D500 Iron deficiency anemia secondary to blood loss (chronic): Secondary | ICD-10-CM | POA: Diagnosis not present

## 2019-07-01 DIAGNOSIS — N186 End stage renal disease: Secondary | ICD-10-CM | POA: Diagnosis not present

## 2019-07-01 DIAGNOSIS — Z992 Dependence on renal dialysis: Secondary | ICD-10-CM | POA: Diagnosis not present

## 2019-07-02 DIAGNOSIS — D631 Anemia in chronic kidney disease: Secondary | ICD-10-CM | POA: Diagnosis not present

## 2019-07-02 DIAGNOSIS — N2581 Secondary hyperparathyroidism of renal origin: Secondary | ICD-10-CM | POA: Diagnosis not present

## 2019-07-02 DIAGNOSIS — Z23 Encounter for immunization: Secondary | ICD-10-CM | POA: Diagnosis not present

## 2019-07-02 DIAGNOSIS — Z992 Dependence on renal dialysis: Secondary | ICD-10-CM | POA: Diagnosis not present

## 2019-07-02 DIAGNOSIS — N186 End stage renal disease: Secondary | ICD-10-CM | POA: Diagnosis not present

## 2019-07-02 DIAGNOSIS — D500 Iron deficiency anemia secondary to blood loss (chronic): Secondary | ICD-10-CM | POA: Diagnosis not present

## 2019-07-04 DIAGNOSIS — Z992 Dependence on renal dialysis: Secondary | ICD-10-CM | POA: Diagnosis not present

## 2019-07-04 DIAGNOSIS — N2581 Secondary hyperparathyroidism of renal origin: Secondary | ICD-10-CM | POA: Diagnosis not present

## 2019-07-04 DIAGNOSIS — D631 Anemia in chronic kidney disease: Secondary | ICD-10-CM | POA: Diagnosis not present

## 2019-07-04 DIAGNOSIS — D500 Iron deficiency anemia secondary to blood loss (chronic): Secondary | ICD-10-CM | POA: Diagnosis not present

## 2019-07-04 DIAGNOSIS — Z23 Encounter for immunization: Secondary | ICD-10-CM | POA: Diagnosis not present

## 2019-07-04 DIAGNOSIS — N186 End stage renal disease: Secondary | ICD-10-CM | POA: Diagnosis not present

## 2019-07-07 DIAGNOSIS — N2581 Secondary hyperparathyroidism of renal origin: Secondary | ICD-10-CM | POA: Diagnosis not present

## 2019-07-07 DIAGNOSIS — N186 End stage renal disease: Secondary | ICD-10-CM | POA: Diagnosis not present

## 2019-07-07 DIAGNOSIS — D631 Anemia in chronic kidney disease: Secondary | ICD-10-CM | POA: Diagnosis not present

## 2019-07-07 DIAGNOSIS — D500 Iron deficiency anemia secondary to blood loss (chronic): Secondary | ICD-10-CM | POA: Diagnosis not present

## 2019-07-07 DIAGNOSIS — Z23 Encounter for immunization: Secondary | ICD-10-CM | POA: Diagnosis not present

## 2019-07-07 DIAGNOSIS — Z992 Dependence on renal dialysis: Secondary | ICD-10-CM | POA: Diagnosis not present

## 2019-07-10 DIAGNOSIS — H2513 Age-related nuclear cataract, bilateral: Secondary | ICD-10-CM | POA: Diagnosis not present

## 2019-07-10 DIAGNOSIS — H40023 Open angle with borderline findings, high risk, bilateral: Secondary | ICD-10-CM | POA: Diagnosis not present

## 2019-07-10 DIAGNOSIS — Z794 Long term (current) use of insulin: Secondary | ICD-10-CM | POA: Diagnosis not present

## 2019-07-10 DIAGNOSIS — E113291 Type 2 diabetes mellitus with mild nonproliferative diabetic retinopathy without macular edema, right eye: Secondary | ICD-10-CM | POA: Diagnosis not present

## 2019-07-31 DIAGNOSIS — N186 End stage renal disease: Secondary | ICD-10-CM | POA: Diagnosis not present

## 2019-07-31 DIAGNOSIS — Z992 Dependence on renal dialysis: Secondary | ICD-10-CM | POA: Diagnosis not present

## 2019-08-01 DIAGNOSIS — N2581 Secondary hyperparathyroidism of renal origin: Secondary | ICD-10-CM | POA: Diagnosis not present

## 2019-08-01 DIAGNOSIS — D500 Iron deficiency anemia secondary to blood loss (chronic): Secondary | ICD-10-CM | POA: Diagnosis not present

## 2019-08-01 DIAGNOSIS — D509 Iron deficiency anemia, unspecified: Secondary | ICD-10-CM | POA: Diagnosis not present

## 2019-08-01 DIAGNOSIS — Z992 Dependence on renal dialysis: Secondary | ICD-10-CM | POA: Diagnosis not present

## 2019-08-01 DIAGNOSIS — N186 End stage renal disease: Secondary | ICD-10-CM | POA: Diagnosis not present

## 2019-08-01 DIAGNOSIS — D631 Anemia in chronic kidney disease: Secondary | ICD-10-CM | POA: Diagnosis not present

## 2019-08-04 DIAGNOSIS — D509 Iron deficiency anemia, unspecified: Secondary | ICD-10-CM | POA: Diagnosis not present

## 2019-08-04 DIAGNOSIS — N2581 Secondary hyperparathyroidism of renal origin: Secondary | ICD-10-CM | POA: Diagnosis not present

## 2019-08-04 DIAGNOSIS — D500 Iron deficiency anemia secondary to blood loss (chronic): Secondary | ICD-10-CM | POA: Diagnosis not present

## 2019-08-04 DIAGNOSIS — N186 End stage renal disease: Secondary | ICD-10-CM | POA: Diagnosis not present

## 2019-08-04 DIAGNOSIS — Z992 Dependence on renal dialysis: Secondary | ICD-10-CM | POA: Diagnosis not present

## 2019-08-06 DIAGNOSIS — N186 End stage renal disease: Secondary | ICD-10-CM | POA: Diagnosis not present

## 2019-08-06 DIAGNOSIS — Z992 Dependence on renal dialysis: Secondary | ICD-10-CM | POA: Diagnosis not present

## 2019-08-06 DIAGNOSIS — D500 Iron deficiency anemia secondary to blood loss (chronic): Secondary | ICD-10-CM | POA: Diagnosis not present

## 2019-08-06 DIAGNOSIS — D509 Iron deficiency anemia, unspecified: Secondary | ICD-10-CM | POA: Diagnosis not present

## 2019-08-06 DIAGNOSIS — N2581 Secondary hyperparathyroidism of renal origin: Secondary | ICD-10-CM | POA: Diagnosis not present

## 2019-08-08 DIAGNOSIS — Z992 Dependence on renal dialysis: Secondary | ICD-10-CM | POA: Diagnosis not present

## 2019-08-08 DIAGNOSIS — D509 Iron deficiency anemia, unspecified: Secondary | ICD-10-CM | POA: Diagnosis not present

## 2019-08-08 DIAGNOSIS — N186 End stage renal disease: Secondary | ICD-10-CM | POA: Diagnosis not present

## 2019-08-08 DIAGNOSIS — N2581 Secondary hyperparathyroidism of renal origin: Secondary | ICD-10-CM | POA: Diagnosis not present

## 2019-08-08 DIAGNOSIS — D500 Iron deficiency anemia secondary to blood loss (chronic): Secondary | ICD-10-CM | POA: Diagnosis not present

## 2019-08-11 DIAGNOSIS — D509 Iron deficiency anemia, unspecified: Secondary | ICD-10-CM | POA: Diagnosis not present

## 2019-08-11 DIAGNOSIS — N186 End stage renal disease: Secondary | ICD-10-CM | POA: Diagnosis not present

## 2019-08-11 DIAGNOSIS — D500 Iron deficiency anemia secondary to blood loss (chronic): Secondary | ICD-10-CM | POA: Diagnosis not present

## 2019-08-11 DIAGNOSIS — N2581 Secondary hyperparathyroidism of renal origin: Secondary | ICD-10-CM | POA: Diagnosis not present

## 2019-08-11 DIAGNOSIS — Z992 Dependence on renal dialysis: Secondary | ICD-10-CM | POA: Diagnosis not present

## 2019-08-13 DIAGNOSIS — N186 End stage renal disease: Secondary | ICD-10-CM | POA: Diagnosis not present

## 2019-08-13 DIAGNOSIS — Z992 Dependence on renal dialysis: Secondary | ICD-10-CM | POA: Diagnosis not present

## 2019-08-13 DIAGNOSIS — N2581 Secondary hyperparathyroidism of renal origin: Secondary | ICD-10-CM | POA: Diagnosis not present

## 2019-08-13 DIAGNOSIS — D500 Iron deficiency anemia secondary to blood loss (chronic): Secondary | ICD-10-CM | POA: Diagnosis not present

## 2019-08-13 DIAGNOSIS — D509 Iron deficiency anemia, unspecified: Secondary | ICD-10-CM | POA: Diagnosis not present

## 2019-08-15 DIAGNOSIS — N186 End stage renal disease: Secondary | ICD-10-CM | POA: Diagnosis not present

## 2019-08-15 DIAGNOSIS — Z992 Dependence on renal dialysis: Secondary | ICD-10-CM | POA: Diagnosis not present

## 2019-08-15 DIAGNOSIS — D509 Iron deficiency anemia, unspecified: Secondary | ICD-10-CM | POA: Diagnosis not present

## 2019-08-15 DIAGNOSIS — N2581 Secondary hyperparathyroidism of renal origin: Secondary | ICD-10-CM | POA: Diagnosis not present

## 2019-08-15 DIAGNOSIS — D500 Iron deficiency anemia secondary to blood loss (chronic): Secondary | ICD-10-CM | POA: Diagnosis not present

## 2019-08-18 DIAGNOSIS — Z992 Dependence on renal dialysis: Secondary | ICD-10-CM | POA: Diagnosis not present

## 2019-08-18 DIAGNOSIS — N2581 Secondary hyperparathyroidism of renal origin: Secondary | ICD-10-CM | POA: Diagnosis not present

## 2019-08-18 DIAGNOSIS — D509 Iron deficiency anemia, unspecified: Secondary | ICD-10-CM | POA: Diagnosis not present

## 2019-08-18 DIAGNOSIS — N186 End stage renal disease: Secondary | ICD-10-CM | POA: Diagnosis not present

## 2019-08-18 DIAGNOSIS — D500 Iron deficiency anemia secondary to blood loss (chronic): Secondary | ICD-10-CM | POA: Diagnosis not present

## 2019-08-19 DIAGNOSIS — I70213 Atherosclerosis of native arteries of extremities with intermittent claudication, bilateral legs: Secondary | ICD-10-CM | POA: Diagnosis not present

## 2019-08-20 DIAGNOSIS — N2581 Secondary hyperparathyroidism of renal origin: Secondary | ICD-10-CM | POA: Diagnosis not present

## 2019-08-20 DIAGNOSIS — Z992 Dependence on renal dialysis: Secondary | ICD-10-CM | POA: Diagnosis not present

## 2019-08-20 DIAGNOSIS — D509 Iron deficiency anemia, unspecified: Secondary | ICD-10-CM | POA: Diagnosis not present

## 2019-08-20 DIAGNOSIS — N186 End stage renal disease: Secondary | ICD-10-CM | POA: Diagnosis not present

## 2019-08-20 DIAGNOSIS — D500 Iron deficiency anemia secondary to blood loss (chronic): Secondary | ICD-10-CM | POA: Diagnosis not present

## 2019-08-22 DIAGNOSIS — D509 Iron deficiency anemia, unspecified: Secondary | ICD-10-CM | POA: Diagnosis not present

## 2019-08-22 DIAGNOSIS — D500 Iron deficiency anemia secondary to blood loss (chronic): Secondary | ICD-10-CM | POA: Diagnosis not present

## 2019-08-22 DIAGNOSIS — Z992 Dependence on renal dialysis: Secondary | ICD-10-CM | POA: Diagnosis not present

## 2019-08-22 DIAGNOSIS — N186 End stage renal disease: Secondary | ICD-10-CM | POA: Diagnosis not present

## 2019-08-22 DIAGNOSIS — N2581 Secondary hyperparathyroidism of renal origin: Secondary | ICD-10-CM | POA: Diagnosis not present

## 2019-08-25 DIAGNOSIS — D509 Iron deficiency anemia, unspecified: Secondary | ICD-10-CM | POA: Diagnosis not present

## 2019-08-25 DIAGNOSIS — D500 Iron deficiency anemia secondary to blood loss (chronic): Secondary | ICD-10-CM | POA: Diagnosis not present

## 2019-08-25 DIAGNOSIS — N2581 Secondary hyperparathyroidism of renal origin: Secondary | ICD-10-CM | POA: Diagnosis not present

## 2019-08-25 DIAGNOSIS — N186 End stage renal disease: Secondary | ICD-10-CM | POA: Diagnosis not present

## 2019-08-25 DIAGNOSIS — Z992 Dependence on renal dialysis: Secondary | ICD-10-CM | POA: Diagnosis not present

## 2019-08-27 DIAGNOSIS — Z992 Dependence on renal dialysis: Secondary | ICD-10-CM | POA: Diagnosis not present

## 2019-08-27 DIAGNOSIS — D509 Iron deficiency anemia, unspecified: Secondary | ICD-10-CM | POA: Diagnosis not present

## 2019-08-27 DIAGNOSIS — N186 End stage renal disease: Secondary | ICD-10-CM | POA: Diagnosis not present

## 2019-08-27 DIAGNOSIS — D500 Iron deficiency anemia secondary to blood loss (chronic): Secondary | ICD-10-CM | POA: Diagnosis not present

## 2019-08-27 DIAGNOSIS — N2581 Secondary hyperparathyroidism of renal origin: Secondary | ICD-10-CM | POA: Diagnosis not present

## 2019-08-29 DIAGNOSIS — D509 Iron deficiency anemia, unspecified: Secondary | ICD-10-CM | POA: Diagnosis not present

## 2019-08-29 DIAGNOSIS — Z992 Dependence on renal dialysis: Secondary | ICD-10-CM | POA: Diagnosis not present

## 2019-08-29 DIAGNOSIS — N186 End stage renal disease: Secondary | ICD-10-CM | POA: Diagnosis not present

## 2019-08-29 DIAGNOSIS — D500 Iron deficiency anemia secondary to blood loss (chronic): Secondary | ICD-10-CM | POA: Diagnosis not present

## 2019-08-29 DIAGNOSIS — N2581 Secondary hyperparathyroidism of renal origin: Secondary | ICD-10-CM | POA: Diagnosis not present

## 2019-08-31 DIAGNOSIS — Z992 Dependence on renal dialysis: Secondary | ICD-10-CM | POA: Diagnosis not present

## 2019-08-31 DIAGNOSIS — N186 End stage renal disease: Secondary | ICD-10-CM | POA: Diagnosis not present

## 2019-09-28 DIAGNOSIS — L97519 Non-pressure chronic ulcer of other part of right foot with unspecified severity: Secondary | ICD-10-CM | POA: Diagnosis not present

## 2019-10-01 DIAGNOSIS — D631 Anemia in chronic kidney disease: Secondary | ICD-10-CM | POA: Diagnosis not present

## 2019-10-01 DIAGNOSIS — E1129 Type 2 diabetes mellitus with other diabetic kidney complication: Secondary | ICD-10-CM | POA: Diagnosis not present

## 2019-10-01 DIAGNOSIS — E11621 Type 2 diabetes mellitus with foot ulcer: Secondary | ICD-10-CM | POA: Diagnosis not present

## 2019-10-01 DIAGNOSIS — N186 End stage renal disease: Secondary | ICD-10-CM | POA: Diagnosis not present

## 2019-10-01 DIAGNOSIS — D500 Iron deficiency anemia secondary to blood loss (chronic): Secondary | ICD-10-CM | POA: Diagnosis not present

## 2019-10-01 DIAGNOSIS — D509 Iron deficiency anemia, unspecified: Secondary | ICD-10-CM | POA: Diagnosis not present

## 2019-10-01 DIAGNOSIS — N2581 Secondary hyperparathyroidism of renal origin: Secondary | ICD-10-CM | POA: Diagnosis not present

## 2019-10-01 DIAGNOSIS — Z992 Dependence on renal dialysis: Secondary | ICD-10-CM | POA: Diagnosis not present

## 2019-10-03 DIAGNOSIS — E11621 Type 2 diabetes mellitus with foot ulcer: Secondary | ICD-10-CM | POA: Diagnosis not present

## 2019-10-03 DIAGNOSIS — Z992 Dependence on renal dialysis: Secondary | ICD-10-CM | POA: Diagnosis not present

## 2019-10-03 DIAGNOSIS — N2581 Secondary hyperparathyroidism of renal origin: Secondary | ICD-10-CM | POA: Diagnosis not present

## 2019-10-03 DIAGNOSIS — N186 End stage renal disease: Secondary | ICD-10-CM | POA: Diagnosis not present

## 2019-10-03 DIAGNOSIS — E1129 Type 2 diabetes mellitus with other diabetic kidney complication: Secondary | ICD-10-CM | POA: Diagnosis not present

## 2019-10-06 DIAGNOSIS — N2581 Secondary hyperparathyroidism of renal origin: Secondary | ICD-10-CM | POA: Diagnosis not present

## 2019-10-06 DIAGNOSIS — E11621 Type 2 diabetes mellitus with foot ulcer: Secondary | ICD-10-CM | POA: Diagnosis not present

## 2019-10-06 DIAGNOSIS — Z992 Dependence on renal dialysis: Secondary | ICD-10-CM | POA: Diagnosis not present

## 2019-10-06 DIAGNOSIS — E1129 Type 2 diabetes mellitus with other diabetic kidney complication: Secondary | ICD-10-CM | POA: Diagnosis not present

## 2019-10-06 DIAGNOSIS — N186 End stage renal disease: Secondary | ICD-10-CM | POA: Diagnosis not present

## 2019-10-07 DIAGNOSIS — H25012 Cortical age-related cataract, left eye: Secondary | ICD-10-CM | POA: Diagnosis not present

## 2019-10-08 DIAGNOSIS — E11621 Type 2 diabetes mellitus with foot ulcer: Secondary | ICD-10-CM | POA: Diagnosis not present

## 2019-10-08 DIAGNOSIS — N2581 Secondary hyperparathyroidism of renal origin: Secondary | ICD-10-CM | POA: Diagnosis not present

## 2019-10-08 DIAGNOSIS — E1129 Type 2 diabetes mellitus with other diabetic kidney complication: Secondary | ICD-10-CM | POA: Diagnosis not present

## 2019-10-08 DIAGNOSIS — N186 End stage renal disease: Secondary | ICD-10-CM | POA: Diagnosis not present

## 2019-10-08 DIAGNOSIS — Z992 Dependence on renal dialysis: Secondary | ICD-10-CM | POA: Diagnosis not present

## 2019-10-10 DIAGNOSIS — N2581 Secondary hyperparathyroidism of renal origin: Secondary | ICD-10-CM | POA: Diagnosis not present

## 2019-10-10 DIAGNOSIS — Z992 Dependence on renal dialysis: Secondary | ICD-10-CM | POA: Diagnosis not present

## 2019-10-10 DIAGNOSIS — N186 End stage renal disease: Secondary | ICD-10-CM | POA: Diagnosis not present

## 2019-10-10 DIAGNOSIS — E1129 Type 2 diabetes mellitus with other diabetic kidney complication: Secondary | ICD-10-CM | POA: Diagnosis not present

## 2019-10-10 DIAGNOSIS — E11621 Type 2 diabetes mellitus with foot ulcer: Secondary | ICD-10-CM | POA: Diagnosis not present

## 2019-10-12 DIAGNOSIS — L02611 Cutaneous abscess of right foot: Secondary | ICD-10-CM | POA: Diagnosis not present

## 2019-10-13 DIAGNOSIS — N186 End stage renal disease: Secondary | ICD-10-CM | POA: Diagnosis not present

## 2019-10-13 DIAGNOSIS — E11621 Type 2 diabetes mellitus with foot ulcer: Secondary | ICD-10-CM | POA: Diagnosis not present

## 2019-10-13 DIAGNOSIS — N2581 Secondary hyperparathyroidism of renal origin: Secondary | ICD-10-CM | POA: Diagnosis not present

## 2019-10-13 DIAGNOSIS — Z992 Dependence on renal dialysis: Secondary | ICD-10-CM | POA: Diagnosis not present

## 2019-10-13 DIAGNOSIS — E1129 Type 2 diabetes mellitus with other diabetic kidney complication: Secondary | ICD-10-CM | POA: Diagnosis not present

## 2019-10-15 DIAGNOSIS — Z992 Dependence on renal dialysis: Secondary | ICD-10-CM | POA: Diagnosis not present

## 2019-10-15 DIAGNOSIS — N2581 Secondary hyperparathyroidism of renal origin: Secondary | ICD-10-CM | POA: Diagnosis not present

## 2019-10-15 DIAGNOSIS — E1129 Type 2 diabetes mellitus with other diabetic kidney complication: Secondary | ICD-10-CM | POA: Diagnosis not present

## 2019-10-15 DIAGNOSIS — E11621 Type 2 diabetes mellitus with foot ulcer: Secondary | ICD-10-CM | POA: Diagnosis not present

## 2019-10-15 DIAGNOSIS — N186 End stage renal disease: Secondary | ICD-10-CM | POA: Diagnosis not present

## 2019-10-17 DIAGNOSIS — N186 End stage renal disease: Secondary | ICD-10-CM | POA: Diagnosis not present

## 2019-10-17 DIAGNOSIS — N2581 Secondary hyperparathyroidism of renal origin: Secondary | ICD-10-CM | POA: Diagnosis not present

## 2019-10-17 DIAGNOSIS — Z992 Dependence on renal dialysis: Secondary | ICD-10-CM | POA: Diagnosis not present

## 2019-10-17 DIAGNOSIS — E11621 Type 2 diabetes mellitus with foot ulcer: Secondary | ICD-10-CM | POA: Diagnosis not present

## 2019-10-17 DIAGNOSIS — E1129 Type 2 diabetes mellitus with other diabetic kidney complication: Secondary | ICD-10-CM | POA: Diagnosis not present

## 2019-10-19 DIAGNOSIS — I10 Essential (primary) hypertension: Secondary | ICD-10-CM | POA: Diagnosis not present

## 2019-10-19 DIAGNOSIS — E1165 Type 2 diabetes mellitus with hyperglycemia: Secondary | ICD-10-CM | POA: Diagnosis not present

## 2019-10-19 DIAGNOSIS — L97509 Non-pressure chronic ulcer of other part of unspecified foot with unspecified severity: Secondary | ICD-10-CM | POA: Diagnosis not present

## 2019-10-19 DIAGNOSIS — Z299 Encounter for prophylactic measures, unspecified: Secondary | ICD-10-CM | POA: Diagnosis not present

## 2019-10-19 DIAGNOSIS — E11621 Type 2 diabetes mellitus with foot ulcer: Secondary | ICD-10-CM | POA: Diagnosis not present

## 2019-10-19 DIAGNOSIS — Z992 Dependence on renal dialysis: Secondary | ICD-10-CM | POA: Diagnosis not present

## 2019-10-20 DIAGNOSIS — N186 End stage renal disease: Secondary | ICD-10-CM | POA: Diagnosis not present

## 2019-10-20 DIAGNOSIS — E11621 Type 2 diabetes mellitus with foot ulcer: Secondary | ICD-10-CM | POA: Diagnosis not present

## 2019-10-20 DIAGNOSIS — E1129 Type 2 diabetes mellitus with other diabetic kidney complication: Secondary | ICD-10-CM | POA: Diagnosis not present

## 2019-10-20 DIAGNOSIS — N2581 Secondary hyperparathyroidism of renal origin: Secondary | ICD-10-CM | POA: Diagnosis not present

## 2019-10-20 DIAGNOSIS — Z992 Dependence on renal dialysis: Secondary | ICD-10-CM | POA: Diagnosis not present

## 2019-10-21 DIAGNOSIS — H2511 Age-related nuclear cataract, right eye: Secondary | ICD-10-CM | POA: Diagnosis not present

## 2019-10-21 DIAGNOSIS — H25011 Cortical age-related cataract, right eye: Secondary | ICD-10-CM | POA: Diagnosis not present

## 2019-10-22 DIAGNOSIS — E11621 Type 2 diabetes mellitus with foot ulcer: Secondary | ICD-10-CM | POA: Diagnosis not present

## 2019-10-22 DIAGNOSIS — N2581 Secondary hyperparathyroidism of renal origin: Secondary | ICD-10-CM | POA: Diagnosis not present

## 2019-10-22 DIAGNOSIS — N186 End stage renal disease: Secondary | ICD-10-CM | POA: Diagnosis not present

## 2019-10-22 DIAGNOSIS — Z992 Dependence on renal dialysis: Secondary | ICD-10-CM | POA: Diagnosis not present

## 2019-10-22 DIAGNOSIS — E1129 Type 2 diabetes mellitus with other diabetic kidney complication: Secondary | ICD-10-CM | POA: Diagnosis not present

## 2019-10-24 DIAGNOSIS — Z992 Dependence on renal dialysis: Secondary | ICD-10-CM | POA: Diagnosis not present

## 2019-10-24 DIAGNOSIS — N2581 Secondary hyperparathyroidism of renal origin: Secondary | ICD-10-CM | POA: Diagnosis not present

## 2019-10-24 DIAGNOSIS — N186 End stage renal disease: Secondary | ICD-10-CM | POA: Diagnosis not present

## 2019-10-24 DIAGNOSIS — E1129 Type 2 diabetes mellitus with other diabetic kidney complication: Secondary | ICD-10-CM | POA: Diagnosis not present

## 2019-10-24 DIAGNOSIS — E11621 Type 2 diabetes mellitus with foot ulcer: Secondary | ICD-10-CM | POA: Diagnosis not present

## 2019-10-26 DIAGNOSIS — L97509 Non-pressure chronic ulcer of other part of unspecified foot with unspecified severity: Secondary | ICD-10-CM | POA: Diagnosis not present

## 2019-10-26 DIAGNOSIS — E11621 Type 2 diabetes mellitus with foot ulcer: Secondary | ICD-10-CM | POA: Diagnosis not present

## 2019-10-26 DIAGNOSIS — Z299 Encounter for prophylactic measures, unspecified: Secondary | ICD-10-CM | POA: Diagnosis not present

## 2019-10-26 DIAGNOSIS — E1165 Type 2 diabetes mellitus with hyperglycemia: Secondary | ICD-10-CM | POA: Diagnosis not present

## 2019-10-26 DIAGNOSIS — I10 Essential (primary) hypertension: Secondary | ICD-10-CM | POA: Diagnosis not present

## 2019-10-26 DIAGNOSIS — E1142 Type 2 diabetes mellitus with diabetic polyneuropathy: Secondary | ICD-10-CM | POA: Diagnosis not present

## 2019-10-27 DIAGNOSIS — Z992 Dependence on renal dialysis: Secondary | ICD-10-CM | POA: Diagnosis not present

## 2019-10-27 DIAGNOSIS — N186 End stage renal disease: Secondary | ICD-10-CM | POA: Diagnosis not present

## 2019-10-27 DIAGNOSIS — E11621 Type 2 diabetes mellitus with foot ulcer: Secondary | ICD-10-CM | POA: Diagnosis not present

## 2019-10-27 DIAGNOSIS — N2581 Secondary hyperparathyroidism of renal origin: Secondary | ICD-10-CM | POA: Diagnosis not present

## 2019-10-27 DIAGNOSIS — E1129 Type 2 diabetes mellitus with other diabetic kidney complication: Secondary | ICD-10-CM | POA: Diagnosis not present

## 2019-10-28 DIAGNOSIS — L97512 Non-pressure chronic ulcer of other part of right foot with fat layer exposed: Secondary | ICD-10-CM | POA: Diagnosis not present

## 2019-10-28 DIAGNOSIS — Z89421 Acquired absence of other right toe(s): Secondary | ICD-10-CM | POA: Diagnosis not present

## 2019-10-28 DIAGNOSIS — E1140 Type 2 diabetes mellitus with diabetic neuropathy, unspecified: Secondary | ICD-10-CM | POA: Diagnosis not present

## 2019-10-28 DIAGNOSIS — L02611 Cutaneous abscess of right foot: Secondary | ICD-10-CM | POA: Diagnosis not present

## 2019-10-28 DIAGNOSIS — Z992 Dependence on renal dialysis: Secondary | ICD-10-CM | POA: Diagnosis not present

## 2019-10-29 DIAGNOSIS — E1129 Type 2 diabetes mellitus with other diabetic kidney complication: Secondary | ICD-10-CM | POA: Diagnosis not present

## 2019-10-29 DIAGNOSIS — N2581 Secondary hyperparathyroidism of renal origin: Secondary | ICD-10-CM | POA: Diagnosis not present

## 2019-10-29 DIAGNOSIS — N186 End stage renal disease: Secondary | ICD-10-CM | POA: Diagnosis not present

## 2019-10-29 DIAGNOSIS — Z992 Dependence on renal dialysis: Secondary | ICD-10-CM | POA: Diagnosis not present

## 2019-10-29 DIAGNOSIS — E11621 Type 2 diabetes mellitus with foot ulcer: Secondary | ICD-10-CM | POA: Diagnosis not present

## 2019-10-31 DIAGNOSIS — N2581 Secondary hyperparathyroidism of renal origin: Secondary | ICD-10-CM | POA: Diagnosis not present

## 2019-10-31 DIAGNOSIS — N186 End stage renal disease: Secondary | ICD-10-CM | POA: Diagnosis not present

## 2019-10-31 DIAGNOSIS — Z992 Dependence on renal dialysis: Secondary | ICD-10-CM | POA: Diagnosis not present

## 2019-10-31 DIAGNOSIS — E1129 Type 2 diabetes mellitus with other diabetic kidney complication: Secondary | ICD-10-CM | POA: Diagnosis not present

## 2019-10-31 DIAGNOSIS — E11621 Type 2 diabetes mellitus with foot ulcer: Secondary | ICD-10-CM | POA: Diagnosis not present

## 2019-11-03 DIAGNOSIS — E1129 Type 2 diabetes mellitus with other diabetic kidney complication: Secondary | ICD-10-CM | POA: Diagnosis not present

## 2019-11-03 DIAGNOSIS — D509 Iron deficiency anemia, unspecified: Secondary | ICD-10-CM | POA: Diagnosis not present

## 2019-11-03 DIAGNOSIS — Z992 Dependence on renal dialysis: Secondary | ICD-10-CM | POA: Diagnosis not present

## 2019-11-03 DIAGNOSIS — Z23 Encounter for immunization: Secondary | ICD-10-CM | POA: Diagnosis not present

## 2019-11-03 DIAGNOSIS — N186 End stage renal disease: Secondary | ICD-10-CM | POA: Diagnosis not present

## 2019-11-03 DIAGNOSIS — N2581 Secondary hyperparathyroidism of renal origin: Secondary | ICD-10-CM | POA: Diagnosis not present

## 2019-11-03 DIAGNOSIS — D631 Anemia in chronic kidney disease: Secondary | ICD-10-CM | POA: Diagnosis not present

## 2019-11-03 DIAGNOSIS — D500 Iron deficiency anemia secondary to blood loss (chronic): Secondary | ICD-10-CM | POA: Diagnosis not present

## 2019-11-05 DIAGNOSIS — N2581 Secondary hyperparathyroidism of renal origin: Secondary | ICD-10-CM | POA: Diagnosis not present

## 2019-11-05 DIAGNOSIS — E1129 Type 2 diabetes mellitus with other diabetic kidney complication: Secondary | ICD-10-CM | POA: Diagnosis not present

## 2019-11-05 DIAGNOSIS — Z992 Dependence on renal dialysis: Secondary | ICD-10-CM | POA: Diagnosis not present

## 2019-11-05 DIAGNOSIS — N186 End stage renal disease: Secondary | ICD-10-CM | POA: Diagnosis not present

## 2019-11-05 DIAGNOSIS — D509 Iron deficiency anemia, unspecified: Secondary | ICD-10-CM | POA: Diagnosis not present

## 2019-11-05 DIAGNOSIS — D500 Iron deficiency anemia secondary to blood loss (chronic): Secondary | ICD-10-CM | POA: Diagnosis not present

## 2019-11-06 DIAGNOSIS — Z961 Presence of intraocular lens: Secondary | ICD-10-CM | POA: Diagnosis not present

## 2019-11-06 DIAGNOSIS — H43821 Vitreomacular adhesion, right eye: Secondary | ICD-10-CM | POA: Diagnosis not present

## 2019-11-07 DIAGNOSIS — Z992 Dependence on renal dialysis: Secondary | ICD-10-CM | POA: Diagnosis not present

## 2019-11-07 DIAGNOSIS — D509 Iron deficiency anemia, unspecified: Secondary | ICD-10-CM | POA: Diagnosis not present

## 2019-11-07 DIAGNOSIS — N186 End stage renal disease: Secondary | ICD-10-CM | POA: Diagnosis not present

## 2019-11-07 DIAGNOSIS — N2581 Secondary hyperparathyroidism of renal origin: Secondary | ICD-10-CM | POA: Diagnosis not present

## 2019-11-07 DIAGNOSIS — E1129 Type 2 diabetes mellitus with other diabetic kidney complication: Secondary | ICD-10-CM | POA: Diagnosis not present

## 2019-11-07 DIAGNOSIS — D500 Iron deficiency anemia secondary to blood loss (chronic): Secondary | ICD-10-CM | POA: Diagnosis not present

## 2019-11-10 DIAGNOSIS — N186 End stage renal disease: Secondary | ICD-10-CM | POA: Diagnosis not present

## 2019-11-10 DIAGNOSIS — D500 Iron deficiency anemia secondary to blood loss (chronic): Secondary | ICD-10-CM | POA: Diagnosis not present

## 2019-11-10 DIAGNOSIS — Z992 Dependence on renal dialysis: Secondary | ICD-10-CM | POA: Diagnosis not present

## 2019-11-10 DIAGNOSIS — D509 Iron deficiency anemia, unspecified: Secondary | ICD-10-CM | POA: Diagnosis not present

## 2019-11-10 DIAGNOSIS — N2581 Secondary hyperparathyroidism of renal origin: Secondary | ICD-10-CM | POA: Diagnosis not present

## 2019-11-10 DIAGNOSIS — E1129 Type 2 diabetes mellitus with other diabetic kidney complication: Secondary | ICD-10-CM | POA: Diagnosis not present

## 2019-11-12 DIAGNOSIS — Z992 Dependence on renal dialysis: Secondary | ICD-10-CM | POA: Diagnosis not present

## 2019-11-12 DIAGNOSIS — D500 Iron deficiency anemia secondary to blood loss (chronic): Secondary | ICD-10-CM | POA: Diagnosis not present

## 2019-11-12 DIAGNOSIS — D509 Iron deficiency anemia, unspecified: Secondary | ICD-10-CM | POA: Diagnosis not present

## 2019-11-12 DIAGNOSIS — N2581 Secondary hyperparathyroidism of renal origin: Secondary | ICD-10-CM | POA: Diagnosis not present

## 2019-11-12 DIAGNOSIS — E1129 Type 2 diabetes mellitus with other diabetic kidney complication: Secondary | ICD-10-CM | POA: Diagnosis not present

## 2019-11-12 DIAGNOSIS — N186 End stage renal disease: Secondary | ICD-10-CM | POA: Diagnosis not present

## 2019-11-14 DIAGNOSIS — D500 Iron deficiency anemia secondary to blood loss (chronic): Secondary | ICD-10-CM | POA: Diagnosis not present

## 2019-11-14 DIAGNOSIS — Z992 Dependence on renal dialysis: Secondary | ICD-10-CM | POA: Diagnosis not present

## 2019-11-14 DIAGNOSIS — D509 Iron deficiency anemia, unspecified: Secondary | ICD-10-CM | POA: Diagnosis not present

## 2019-11-14 DIAGNOSIS — E1129 Type 2 diabetes mellitus with other diabetic kidney complication: Secondary | ICD-10-CM | POA: Diagnosis not present

## 2019-11-14 DIAGNOSIS — N2581 Secondary hyperparathyroidism of renal origin: Secondary | ICD-10-CM | POA: Diagnosis not present

## 2019-11-14 DIAGNOSIS — N186 End stage renal disease: Secondary | ICD-10-CM | POA: Diagnosis not present

## 2019-11-16 DIAGNOSIS — L97519 Non-pressure chronic ulcer of other part of right foot with unspecified severity: Secondary | ICD-10-CM | POA: Diagnosis not present

## 2019-11-16 DIAGNOSIS — E1140 Type 2 diabetes mellitus with diabetic neuropathy, unspecified: Secondary | ICD-10-CM | POA: Diagnosis not present

## 2019-11-16 DIAGNOSIS — Z89421 Acquired absence of other right toe(s): Secondary | ICD-10-CM | POA: Diagnosis not present

## 2019-11-16 DIAGNOSIS — I739 Peripheral vascular disease, unspecified: Secondary | ICD-10-CM | POA: Diagnosis not present

## 2019-11-16 DIAGNOSIS — I96 Gangrene, not elsewhere classified: Secondary | ICD-10-CM | POA: Diagnosis not present

## 2019-11-16 DIAGNOSIS — M86271 Subacute osteomyelitis, right ankle and foot: Secondary | ICD-10-CM | POA: Diagnosis not present

## 2019-11-16 DIAGNOSIS — L97512 Non-pressure chronic ulcer of other part of right foot with fat layer exposed: Secondary | ICD-10-CM | POA: Diagnosis not present

## 2019-11-16 DIAGNOSIS — L988 Other specified disorders of the skin and subcutaneous tissue: Secondary | ICD-10-CM | POA: Diagnosis not present

## 2019-11-17 DIAGNOSIS — N2581 Secondary hyperparathyroidism of renal origin: Secondary | ICD-10-CM | POA: Diagnosis not present

## 2019-11-17 DIAGNOSIS — D509 Iron deficiency anemia, unspecified: Secondary | ICD-10-CM | POA: Diagnosis not present

## 2019-11-17 DIAGNOSIS — D500 Iron deficiency anemia secondary to blood loss (chronic): Secondary | ICD-10-CM | POA: Diagnosis not present

## 2019-11-17 DIAGNOSIS — E1129 Type 2 diabetes mellitus with other diabetic kidney complication: Secondary | ICD-10-CM | POA: Diagnosis not present

## 2019-11-17 DIAGNOSIS — N186 End stage renal disease: Secondary | ICD-10-CM | POA: Diagnosis not present

## 2019-11-17 DIAGNOSIS — Z992 Dependence on renal dialysis: Secondary | ICD-10-CM | POA: Diagnosis not present

## 2019-11-19 DIAGNOSIS — D500 Iron deficiency anemia secondary to blood loss (chronic): Secondary | ICD-10-CM | POA: Diagnosis not present

## 2019-11-19 DIAGNOSIS — N186 End stage renal disease: Secondary | ICD-10-CM | POA: Diagnosis not present

## 2019-11-19 DIAGNOSIS — D509 Iron deficiency anemia, unspecified: Secondary | ICD-10-CM | POA: Diagnosis not present

## 2019-11-19 DIAGNOSIS — Z992 Dependence on renal dialysis: Secondary | ICD-10-CM | POA: Diagnosis not present

## 2019-11-19 DIAGNOSIS — N2581 Secondary hyperparathyroidism of renal origin: Secondary | ICD-10-CM | POA: Diagnosis not present

## 2019-11-19 DIAGNOSIS — E1129 Type 2 diabetes mellitus with other diabetic kidney complication: Secondary | ICD-10-CM | POA: Diagnosis not present

## 2019-11-21 DIAGNOSIS — N186 End stage renal disease: Secondary | ICD-10-CM | POA: Diagnosis not present

## 2019-11-21 DIAGNOSIS — Z992 Dependence on renal dialysis: Secondary | ICD-10-CM | POA: Diagnosis not present

## 2019-11-21 DIAGNOSIS — E1129 Type 2 diabetes mellitus with other diabetic kidney complication: Secondary | ICD-10-CM | POA: Diagnosis not present

## 2019-11-21 DIAGNOSIS — N2581 Secondary hyperparathyroidism of renal origin: Secondary | ICD-10-CM | POA: Diagnosis not present

## 2019-11-21 DIAGNOSIS — D509 Iron deficiency anemia, unspecified: Secondary | ICD-10-CM | POA: Diagnosis not present

## 2019-11-21 DIAGNOSIS — D500 Iron deficiency anemia secondary to blood loss (chronic): Secondary | ICD-10-CM | POA: Diagnosis not present

## 2019-11-24 DIAGNOSIS — Z992 Dependence on renal dialysis: Secondary | ICD-10-CM | POA: Diagnosis not present

## 2019-11-24 DIAGNOSIS — N2581 Secondary hyperparathyroidism of renal origin: Secondary | ICD-10-CM | POA: Diagnosis not present

## 2019-11-24 DIAGNOSIS — N186 End stage renal disease: Secondary | ICD-10-CM | POA: Diagnosis not present

## 2019-11-24 DIAGNOSIS — D500 Iron deficiency anemia secondary to blood loss (chronic): Secondary | ICD-10-CM | POA: Diagnosis not present

## 2019-11-24 DIAGNOSIS — E1129 Type 2 diabetes mellitus with other diabetic kidney complication: Secondary | ICD-10-CM | POA: Diagnosis not present

## 2019-11-24 DIAGNOSIS — D509 Iron deficiency anemia, unspecified: Secondary | ICD-10-CM | POA: Diagnosis not present

## 2019-11-25 DIAGNOSIS — Z992 Dependence on renal dialysis: Secondary | ICD-10-CM | POA: Diagnosis not present

## 2019-11-25 DIAGNOSIS — I96 Gangrene, not elsewhere classified: Secondary | ICD-10-CM | POA: Diagnosis not present

## 2019-11-25 DIAGNOSIS — L02611 Cutaneous abscess of right foot: Secondary | ICD-10-CM | POA: Diagnosis not present

## 2019-11-25 DIAGNOSIS — E1140 Type 2 diabetes mellitus with diabetic neuropathy, unspecified: Secondary | ICD-10-CM | POA: Diagnosis not present

## 2019-11-25 DIAGNOSIS — M79671 Pain in right foot: Secondary | ICD-10-CM | POA: Diagnosis not present

## 2019-11-26 DIAGNOSIS — Z992 Dependence on renal dialysis: Secondary | ICD-10-CM | POA: Diagnosis not present

## 2019-11-26 DIAGNOSIS — D500 Iron deficiency anemia secondary to blood loss (chronic): Secondary | ICD-10-CM | POA: Diagnosis not present

## 2019-11-26 DIAGNOSIS — D509 Iron deficiency anemia, unspecified: Secondary | ICD-10-CM | POA: Diagnosis not present

## 2019-11-26 DIAGNOSIS — N2581 Secondary hyperparathyroidism of renal origin: Secondary | ICD-10-CM | POA: Diagnosis not present

## 2019-11-26 DIAGNOSIS — E1129 Type 2 diabetes mellitus with other diabetic kidney complication: Secondary | ICD-10-CM | POA: Diagnosis not present

## 2019-11-26 DIAGNOSIS — N186 End stage renal disease: Secondary | ICD-10-CM | POA: Diagnosis not present

## 2019-11-28 DIAGNOSIS — N186 End stage renal disease: Secondary | ICD-10-CM | POA: Diagnosis not present

## 2019-11-28 DIAGNOSIS — D500 Iron deficiency anemia secondary to blood loss (chronic): Secondary | ICD-10-CM | POA: Diagnosis not present

## 2019-11-28 DIAGNOSIS — E1129 Type 2 diabetes mellitus with other diabetic kidney complication: Secondary | ICD-10-CM | POA: Diagnosis not present

## 2019-11-28 DIAGNOSIS — D509 Iron deficiency anemia, unspecified: Secondary | ICD-10-CM | POA: Diagnosis not present

## 2019-11-28 DIAGNOSIS — N2581 Secondary hyperparathyroidism of renal origin: Secondary | ICD-10-CM | POA: Diagnosis not present

## 2019-11-28 DIAGNOSIS — Z992 Dependence on renal dialysis: Secondary | ICD-10-CM | POA: Diagnosis not present

## 2019-12-01 DIAGNOSIS — N2581 Secondary hyperparathyroidism of renal origin: Secondary | ICD-10-CM | POA: Diagnosis not present

## 2019-12-01 DIAGNOSIS — E1129 Type 2 diabetes mellitus with other diabetic kidney complication: Secondary | ICD-10-CM | POA: Diagnosis not present

## 2019-12-01 DIAGNOSIS — Z992 Dependence on renal dialysis: Secondary | ICD-10-CM | POA: Diagnosis not present

## 2019-12-01 DIAGNOSIS — N186 End stage renal disease: Secondary | ICD-10-CM | POA: Diagnosis not present

## 2019-12-01 DIAGNOSIS — D509 Iron deficiency anemia, unspecified: Secondary | ICD-10-CM | POA: Diagnosis not present

## 2019-12-01 DIAGNOSIS — D500 Iron deficiency anemia secondary to blood loss (chronic): Secondary | ICD-10-CM | POA: Diagnosis not present

## 2019-12-03 DIAGNOSIS — N186 End stage renal disease: Secondary | ICD-10-CM | POA: Diagnosis not present

## 2019-12-03 DIAGNOSIS — Z23 Encounter for immunization: Secondary | ICD-10-CM | POA: Diagnosis not present

## 2019-12-03 DIAGNOSIS — N2581 Secondary hyperparathyroidism of renal origin: Secondary | ICD-10-CM | POA: Diagnosis not present

## 2019-12-03 DIAGNOSIS — E1129 Type 2 diabetes mellitus with other diabetic kidney complication: Secondary | ICD-10-CM | POA: Diagnosis not present

## 2019-12-03 DIAGNOSIS — Z992 Dependence on renal dialysis: Secondary | ICD-10-CM | POA: Diagnosis not present

## 2019-12-03 DIAGNOSIS — D631 Anemia in chronic kidney disease: Secondary | ICD-10-CM | POA: Diagnosis not present

## 2019-12-03 DIAGNOSIS — D500 Iron deficiency anemia secondary to blood loss (chronic): Secondary | ICD-10-CM | POA: Diagnosis not present

## 2019-12-03 DIAGNOSIS — D509 Iron deficiency anemia, unspecified: Secondary | ICD-10-CM | POA: Diagnosis not present

## 2019-12-05 DIAGNOSIS — D500 Iron deficiency anemia secondary to blood loss (chronic): Secondary | ICD-10-CM | POA: Diagnosis not present

## 2019-12-05 DIAGNOSIS — N2581 Secondary hyperparathyroidism of renal origin: Secondary | ICD-10-CM | POA: Diagnosis not present

## 2019-12-05 DIAGNOSIS — Z992 Dependence on renal dialysis: Secondary | ICD-10-CM | POA: Diagnosis not present

## 2019-12-05 DIAGNOSIS — E1129 Type 2 diabetes mellitus with other diabetic kidney complication: Secondary | ICD-10-CM | POA: Diagnosis not present

## 2019-12-05 DIAGNOSIS — N186 End stage renal disease: Secondary | ICD-10-CM | POA: Diagnosis not present

## 2019-12-05 DIAGNOSIS — D509 Iron deficiency anemia, unspecified: Secondary | ICD-10-CM | POA: Diagnosis not present

## 2019-12-08 DIAGNOSIS — N186 End stage renal disease: Secondary | ICD-10-CM | POA: Diagnosis not present

## 2019-12-08 DIAGNOSIS — E1129 Type 2 diabetes mellitus with other diabetic kidney complication: Secondary | ICD-10-CM | POA: Diagnosis not present

## 2019-12-08 DIAGNOSIS — D509 Iron deficiency anemia, unspecified: Secondary | ICD-10-CM | POA: Diagnosis not present

## 2019-12-08 DIAGNOSIS — D500 Iron deficiency anemia secondary to blood loss (chronic): Secondary | ICD-10-CM | POA: Diagnosis not present

## 2019-12-08 DIAGNOSIS — N2581 Secondary hyperparathyroidism of renal origin: Secondary | ICD-10-CM | POA: Diagnosis not present

## 2019-12-08 DIAGNOSIS — Z992 Dependence on renal dialysis: Secondary | ICD-10-CM | POA: Diagnosis not present

## 2019-12-10 DIAGNOSIS — D500 Iron deficiency anemia secondary to blood loss (chronic): Secondary | ICD-10-CM | POA: Diagnosis not present

## 2019-12-10 DIAGNOSIS — N186 End stage renal disease: Secondary | ICD-10-CM | POA: Diagnosis not present

## 2019-12-10 DIAGNOSIS — E1129 Type 2 diabetes mellitus with other diabetic kidney complication: Secondary | ICD-10-CM | POA: Diagnosis not present

## 2019-12-10 DIAGNOSIS — Z992 Dependence on renal dialysis: Secondary | ICD-10-CM | POA: Diagnosis not present

## 2019-12-10 DIAGNOSIS — D509 Iron deficiency anemia, unspecified: Secondary | ICD-10-CM | POA: Diagnosis not present

## 2019-12-10 DIAGNOSIS — N2581 Secondary hyperparathyroidism of renal origin: Secondary | ICD-10-CM | POA: Diagnosis not present

## 2019-12-12 DIAGNOSIS — D509 Iron deficiency anemia, unspecified: Secondary | ICD-10-CM | POA: Diagnosis not present

## 2019-12-12 DIAGNOSIS — N2581 Secondary hyperparathyroidism of renal origin: Secondary | ICD-10-CM | POA: Diagnosis not present

## 2019-12-12 DIAGNOSIS — E1129 Type 2 diabetes mellitus with other diabetic kidney complication: Secondary | ICD-10-CM | POA: Diagnosis not present

## 2019-12-12 DIAGNOSIS — N186 End stage renal disease: Secondary | ICD-10-CM | POA: Diagnosis not present

## 2019-12-12 DIAGNOSIS — D500 Iron deficiency anemia secondary to blood loss (chronic): Secondary | ICD-10-CM | POA: Diagnosis not present

## 2019-12-12 DIAGNOSIS — Z992 Dependence on renal dialysis: Secondary | ICD-10-CM | POA: Diagnosis not present

## 2019-12-15 DIAGNOSIS — Z992 Dependence on renal dialysis: Secondary | ICD-10-CM | POA: Diagnosis not present

## 2019-12-15 DIAGNOSIS — Z743 Need for continuous supervision: Secondary | ICD-10-CM | POA: Diagnosis not present

## 2019-12-15 DIAGNOSIS — L03115 Cellulitis of right lower limb: Secondary | ICD-10-CM | POA: Diagnosis not present

## 2019-12-15 DIAGNOSIS — E1122 Type 2 diabetes mellitus with diabetic chronic kidney disease: Secondary | ICD-10-CM | POA: Diagnosis not present

## 2019-12-15 DIAGNOSIS — R531 Weakness: Secondary | ICD-10-CM | POA: Diagnosis not present

## 2019-12-15 DIAGNOSIS — D509 Iron deficiency anemia, unspecified: Secondary | ICD-10-CM | POA: Diagnosis not present

## 2019-12-15 DIAGNOSIS — R6889 Other general symptoms and signs: Secondary | ICD-10-CM | POA: Diagnosis not present

## 2019-12-15 DIAGNOSIS — N186 End stage renal disease: Secondary | ICD-10-CM | POA: Diagnosis not present

## 2019-12-15 DIAGNOSIS — E1129 Type 2 diabetes mellitus with other diabetic kidney complication: Secondary | ICD-10-CM | POA: Diagnosis not present

## 2019-12-15 DIAGNOSIS — I120 Hypertensive chronic kidney disease with stage 5 chronic kidney disease or end stage renal disease: Secondary | ICD-10-CM | POA: Diagnosis not present

## 2019-12-15 DIAGNOSIS — L97519 Non-pressure chronic ulcer of other part of right foot with unspecified severity: Secondary | ICD-10-CM | POA: Diagnosis not present

## 2019-12-15 DIAGNOSIS — M25571 Pain in right ankle and joints of right foot: Secondary | ICD-10-CM | POA: Diagnosis not present

## 2019-12-15 DIAGNOSIS — N2581 Secondary hyperparathyroidism of renal origin: Secondary | ICD-10-CM | POA: Diagnosis not present

## 2019-12-15 DIAGNOSIS — R52 Pain, unspecified: Secondary | ICD-10-CM | POA: Diagnosis not present

## 2019-12-15 DIAGNOSIS — Z89421 Acquired absence of other right toe(s): Secondary | ICD-10-CM | POA: Diagnosis not present

## 2019-12-15 DIAGNOSIS — D500 Iron deficiency anemia secondary to blood loss (chronic): Secondary | ICD-10-CM | POA: Diagnosis not present

## 2019-12-15 DIAGNOSIS — Z794 Long term (current) use of insulin: Secondary | ICD-10-CM | POA: Diagnosis not present

## 2019-12-17 DIAGNOSIS — N2581 Secondary hyperparathyroidism of renal origin: Secondary | ICD-10-CM | POA: Diagnosis not present

## 2019-12-17 DIAGNOSIS — D500 Iron deficiency anemia secondary to blood loss (chronic): Secondary | ICD-10-CM | POA: Diagnosis not present

## 2019-12-17 DIAGNOSIS — E1129 Type 2 diabetes mellitus with other diabetic kidney complication: Secondary | ICD-10-CM | POA: Diagnosis not present

## 2019-12-17 DIAGNOSIS — N186 End stage renal disease: Secondary | ICD-10-CM | POA: Diagnosis not present

## 2019-12-17 DIAGNOSIS — D509 Iron deficiency anemia, unspecified: Secondary | ICD-10-CM | POA: Diagnosis not present

## 2019-12-17 DIAGNOSIS — Z992 Dependence on renal dialysis: Secondary | ICD-10-CM | POA: Diagnosis not present

## 2019-12-19 DIAGNOSIS — Z992 Dependence on renal dialysis: Secondary | ICD-10-CM | POA: Diagnosis not present

## 2019-12-19 DIAGNOSIS — D509 Iron deficiency anemia, unspecified: Secondary | ICD-10-CM | POA: Diagnosis not present

## 2019-12-19 DIAGNOSIS — N2581 Secondary hyperparathyroidism of renal origin: Secondary | ICD-10-CM | POA: Diagnosis not present

## 2019-12-19 DIAGNOSIS — D500 Iron deficiency anemia secondary to blood loss (chronic): Secondary | ICD-10-CM | POA: Diagnosis not present

## 2019-12-19 DIAGNOSIS — E1129 Type 2 diabetes mellitus with other diabetic kidney complication: Secondary | ICD-10-CM | POA: Diagnosis not present

## 2019-12-19 DIAGNOSIS — N186 End stage renal disease: Secondary | ICD-10-CM | POA: Diagnosis not present

## 2019-12-20 DIAGNOSIS — I70235 Atherosclerosis of native arteries of right leg with ulceration of other part of foot: Secondary | ICD-10-CM | POA: Diagnosis not present

## 2019-12-21 DIAGNOSIS — G319 Degenerative disease of nervous system, unspecified: Secondary | ICD-10-CM | POA: Diagnosis not present

## 2019-12-21 DIAGNOSIS — I639 Cerebral infarction, unspecified: Secondary | ICD-10-CM | POA: Diagnosis not present

## 2019-12-21 DIAGNOSIS — I6782 Cerebral ischemia: Secondary | ICD-10-CM | POA: Diagnosis not present

## 2019-12-21 DIAGNOSIS — Z299 Encounter for prophylactic measures, unspecified: Secondary | ICD-10-CM | POA: Diagnosis not present

## 2019-12-21 DIAGNOSIS — G8194 Hemiplegia, unspecified affecting left nondominant side: Secondary | ICD-10-CM | POA: Diagnosis not present

## 2019-12-21 DIAGNOSIS — E1165 Type 2 diabetes mellitus with hyperglycemia: Secondary | ICD-10-CM | POA: Diagnosis not present

## 2019-12-21 DIAGNOSIS — I10 Essential (primary) hypertension: Secondary | ICD-10-CM | POA: Diagnosis not present

## 2019-12-21 DIAGNOSIS — Z6824 Body mass index (BMI) 24.0-24.9, adult: Secondary | ICD-10-CM | POA: Diagnosis not present

## 2019-12-21 DIAGNOSIS — G936 Cerebral edema: Secondary | ICD-10-CM | POA: Diagnosis not present

## 2019-12-22 DIAGNOSIS — Z992 Dependence on renal dialysis: Secondary | ICD-10-CM | POA: Diagnosis not present

## 2019-12-22 DIAGNOSIS — N186 End stage renal disease: Secondary | ICD-10-CM | POA: Diagnosis not present

## 2019-12-22 DIAGNOSIS — E1129 Type 2 diabetes mellitus with other diabetic kidney complication: Secondary | ICD-10-CM | POA: Diagnosis not present

## 2019-12-22 DIAGNOSIS — N2581 Secondary hyperparathyroidism of renal origin: Secondary | ICD-10-CM | POA: Diagnosis not present

## 2019-12-22 DIAGNOSIS — D500 Iron deficiency anemia secondary to blood loss (chronic): Secondary | ICD-10-CM | POA: Diagnosis not present

## 2019-12-22 DIAGNOSIS — D509 Iron deficiency anemia, unspecified: Secondary | ICD-10-CM | POA: Diagnosis not present

## 2019-12-24 DIAGNOSIS — E1129 Type 2 diabetes mellitus with other diabetic kidney complication: Secondary | ICD-10-CM | POA: Diagnosis not present

## 2019-12-24 DIAGNOSIS — D509 Iron deficiency anemia, unspecified: Secondary | ICD-10-CM | POA: Diagnosis not present

## 2019-12-24 DIAGNOSIS — N2581 Secondary hyperparathyroidism of renal origin: Secondary | ICD-10-CM | POA: Diagnosis not present

## 2019-12-24 DIAGNOSIS — Z992 Dependence on renal dialysis: Secondary | ICD-10-CM | POA: Diagnosis not present

## 2019-12-24 DIAGNOSIS — D500 Iron deficiency anemia secondary to blood loss (chronic): Secondary | ICD-10-CM | POA: Diagnosis not present

## 2019-12-24 DIAGNOSIS — N186 End stage renal disease: Secondary | ICD-10-CM | POA: Diagnosis not present

## 2019-12-26 DIAGNOSIS — N186 End stage renal disease: Secondary | ICD-10-CM | POA: Diagnosis not present

## 2019-12-26 DIAGNOSIS — D500 Iron deficiency anemia secondary to blood loss (chronic): Secondary | ICD-10-CM | POA: Diagnosis not present

## 2019-12-26 DIAGNOSIS — N2581 Secondary hyperparathyroidism of renal origin: Secondary | ICD-10-CM | POA: Diagnosis not present

## 2019-12-26 DIAGNOSIS — E1129 Type 2 diabetes mellitus with other diabetic kidney complication: Secondary | ICD-10-CM | POA: Diagnosis not present

## 2019-12-26 DIAGNOSIS — D509 Iron deficiency anemia, unspecified: Secondary | ICD-10-CM | POA: Diagnosis not present

## 2019-12-26 DIAGNOSIS — Z992 Dependence on renal dialysis: Secondary | ICD-10-CM | POA: Diagnosis not present

## 2019-12-28 DIAGNOSIS — G319 Degenerative disease of nervous system, unspecified: Secondary | ICD-10-CM | POA: Diagnosis not present

## 2019-12-28 DIAGNOSIS — S91104A Unspecified open wound of right lesser toe(s) without damage to nail, initial encounter: Secondary | ICD-10-CM | POA: Diagnosis not present

## 2019-12-28 DIAGNOSIS — R531 Weakness: Secondary | ICD-10-CM | POA: Diagnosis not present

## 2019-12-28 DIAGNOSIS — Z7902 Long term (current) use of antithrombotics/antiplatelets: Secondary | ICD-10-CM | POA: Diagnosis not present

## 2019-12-28 DIAGNOSIS — I639 Cerebral infarction, unspecified: Secondary | ICD-10-CM | POA: Diagnosis not present

## 2019-12-28 DIAGNOSIS — E1165 Type 2 diabetes mellitus with hyperglycemia: Secondary | ICD-10-CM | POA: Diagnosis not present

## 2019-12-28 DIAGNOSIS — M868X7 Other osteomyelitis, ankle and foot: Secondary | ICD-10-CM | POA: Diagnosis not present

## 2019-12-28 DIAGNOSIS — E1169 Type 2 diabetes mellitus with other specified complication: Secondary | ICD-10-CM | POA: Diagnosis not present

## 2019-12-28 DIAGNOSIS — S91301A Unspecified open wound, right foot, initial encounter: Secondary | ICD-10-CM | POA: Diagnosis not present

## 2019-12-28 DIAGNOSIS — I6350 Cerebral infarction due to unspecified occlusion or stenosis of unspecified cerebral artery: Secondary | ICD-10-CM | POA: Diagnosis not present

## 2019-12-28 DIAGNOSIS — M86171 Other acute osteomyelitis, right ankle and foot: Secondary | ICD-10-CM | POA: Diagnosis not present

## 2019-12-28 DIAGNOSIS — I2510 Atherosclerotic heart disease of native coronary artery without angina pectoris: Secondary | ICD-10-CM | POA: Diagnosis not present

## 2019-12-28 DIAGNOSIS — R739 Hyperglycemia, unspecified: Secondary | ICD-10-CM | POA: Diagnosis not present

## 2019-12-28 DIAGNOSIS — Z8673 Personal history of transient ischemic attack (TIA), and cerebral infarction without residual deficits: Secondary | ICD-10-CM | POA: Diagnosis not present

## 2019-12-28 DIAGNOSIS — N186 End stage renal disease: Secondary | ICD-10-CM | POA: Diagnosis not present

## 2019-12-28 DIAGNOSIS — M19071 Primary osteoarthritis, right ankle and foot: Secondary | ICD-10-CM | POA: Diagnosis not present

## 2019-12-28 DIAGNOSIS — E785 Hyperlipidemia, unspecified: Secondary | ICD-10-CM | POA: Diagnosis not present

## 2019-12-28 DIAGNOSIS — Z20822 Contact with and (suspected) exposure to covid-19: Secondary | ICD-10-CM | POA: Diagnosis not present

## 2019-12-28 DIAGNOSIS — E1122 Type 2 diabetes mellitus with diabetic chronic kidney disease: Secondary | ICD-10-CM | POA: Diagnosis not present

## 2019-12-29 DIAGNOSIS — E1129 Type 2 diabetes mellitus with other diabetic kidney complication: Secondary | ICD-10-CM | POA: Diagnosis not present

## 2019-12-29 DIAGNOSIS — D509 Iron deficiency anemia, unspecified: Secondary | ICD-10-CM | POA: Diagnosis not present

## 2019-12-29 DIAGNOSIS — Z992 Dependence on renal dialysis: Secondary | ICD-10-CM | POA: Diagnosis not present

## 2019-12-29 DIAGNOSIS — N186 End stage renal disease: Secondary | ICD-10-CM | POA: Diagnosis not present

## 2019-12-29 DIAGNOSIS — D500 Iron deficiency anemia secondary to blood loss (chronic): Secondary | ICD-10-CM | POA: Diagnosis not present

## 2019-12-29 DIAGNOSIS — N2581 Secondary hyperparathyroidism of renal origin: Secondary | ICD-10-CM | POA: Diagnosis not present

## 2019-12-30 DIAGNOSIS — L97519 Non-pressure chronic ulcer of other part of right foot with unspecified severity: Secondary | ICD-10-CM | POA: Diagnosis not present

## 2019-12-30 DIAGNOSIS — E1140 Type 2 diabetes mellitus with diabetic neuropathy, unspecified: Secondary | ICD-10-CM | POA: Diagnosis not present

## 2019-12-30 DIAGNOSIS — J61 Pneumoconiosis due to asbestos and other mineral fibers: Secondary | ICD-10-CM | POA: Diagnosis not present

## 2019-12-30 DIAGNOSIS — E1142 Type 2 diabetes mellitus with diabetic polyneuropathy: Secondary | ICD-10-CM | POA: Diagnosis not present

## 2019-12-30 DIAGNOSIS — E1165 Type 2 diabetes mellitus with hyperglycemia: Secondary | ICD-10-CM | POA: Diagnosis not present

## 2019-12-30 DIAGNOSIS — M86171 Other acute osteomyelitis, right ankle and foot: Secondary | ICD-10-CM | POA: Diagnosis not present

## 2019-12-30 DIAGNOSIS — Z992 Dependence on renal dialysis: Secondary | ICD-10-CM | POA: Diagnosis not present

## 2019-12-30 DIAGNOSIS — N186 End stage renal disease: Secondary | ICD-10-CM | POA: Diagnosis not present

## 2019-12-30 DIAGNOSIS — I69952 Hemiplegia and hemiparesis following unspecified cerebrovascular disease affecting left dominant side: Secondary | ICD-10-CM | POA: Diagnosis not present

## 2019-12-30 DIAGNOSIS — I739 Peripheral vascular disease, unspecified: Secondary | ICD-10-CM | POA: Diagnosis not present

## 2019-12-30 DIAGNOSIS — I69352 Hemiplegia and hemiparesis following cerebral infarction affecting left dominant side: Secondary | ICD-10-CM | POA: Diagnosis not present

## 2019-12-30 DIAGNOSIS — E1122 Type 2 diabetes mellitus with diabetic chronic kidney disease: Secondary | ICD-10-CM | POA: Diagnosis not present

## 2019-12-30 DIAGNOSIS — Z794 Long term (current) use of insulin: Secondary | ICD-10-CM | POA: Diagnosis not present

## 2019-12-30 DIAGNOSIS — Z89421 Acquired absence of other right toe(s): Secondary | ICD-10-CM | POA: Diagnosis not present

## 2019-12-30 DIAGNOSIS — I10 Essential (primary) hypertension: Secondary | ICD-10-CM | POA: Diagnosis not present

## 2019-12-30 DIAGNOSIS — L97512 Non-pressure chronic ulcer of other part of right foot with fat layer exposed: Secondary | ICD-10-CM | POA: Diagnosis not present

## 2019-12-31 DIAGNOSIS — N186 End stage renal disease: Secondary | ICD-10-CM | POA: Diagnosis not present

## 2019-12-31 DIAGNOSIS — D509 Iron deficiency anemia, unspecified: Secondary | ICD-10-CM | POA: Diagnosis not present

## 2019-12-31 DIAGNOSIS — D500 Iron deficiency anemia secondary to blood loss (chronic): Secondary | ICD-10-CM | POA: Diagnosis not present

## 2019-12-31 DIAGNOSIS — N2581 Secondary hyperparathyroidism of renal origin: Secondary | ICD-10-CM | POA: Diagnosis not present

## 2019-12-31 DIAGNOSIS — Z992 Dependence on renal dialysis: Secondary | ICD-10-CM | POA: Diagnosis not present

## 2019-12-31 DIAGNOSIS — E1129 Type 2 diabetes mellitus with other diabetic kidney complication: Secondary | ICD-10-CM | POA: Diagnosis not present

## 2020-01-02 DIAGNOSIS — E1129 Type 2 diabetes mellitus with other diabetic kidney complication: Secondary | ICD-10-CM | POA: Diagnosis not present

## 2020-01-02 DIAGNOSIS — D509 Iron deficiency anemia, unspecified: Secondary | ICD-10-CM | POA: Diagnosis not present

## 2020-01-02 DIAGNOSIS — D500 Iron deficiency anemia secondary to blood loss (chronic): Secondary | ICD-10-CM | POA: Diagnosis not present

## 2020-01-02 DIAGNOSIS — N186 End stage renal disease: Secondary | ICD-10-CM | POA: Diagnosis not present

## 2020-01-02 DIAGNOSIS — Z23 Encounter for immunization: Secondary | ICD-10-CM | POA: Diagnosis not present

## 2020-01-02 DIAGNOSIS — D631 Anemia in chronic kidney disease: Secondary | ICD-10-CM | POA: Diagnosis not present

## 2020-01-02 DIAGNOSIS — Z992 Dependence on renal dialysis: Secondary | ICD-10-CM | POA: Diagnosis not present

## 2020-01-02 DIAGNOSIS — N2581 Secondary hyperparathyroidism of renal origin: Secondary | ICD-10-CM | POA: Diagnosis not present

## 2020-01-05 DIAGNOSIS — D631 Anemia in chronic kidney disease: Secondary | ICD-10-CM | POA: Diagnosis not present

## 2020-01-05 DIAGNOSIS — D500 Iron deficiency anemia secondary to blood loss (chronic): Secondary | ICD-10-CM | POA: Diagnosis not present

## 2020-01-05 DIAGNOSIS — N2581 Secondary hyperparathyroidism of renal origin: Secondary | ICD-10-CM | POA: Diagnosis not present

## 2020-01-05 DIAGNOSIS — D509 Iron deficiency anemia, unspecified: Secondary | ICD-10-CM | POA: Diagnosis not present

## 2020-01-05 DIAGNOSIS — E1129 Type 2 diabetes mellitus with other diabetic kidney complication: Secondary | ICD-10-CM | POA: Diagnosis not present

## 2020-01-05 DIAGNOSIS — N186 End stage renal disease: Secondary | ICD-10-CM | POA: Diagnosis not present

## 2020-01-05 DIAGNOSIS — Z992 Dependence on renal dialysis: Secondary | ICD-10-CM | POA: Diagnosis not present

## 2020-01-06 DIAGNOSIS — I10 Essential (primary) hypertension: Secondary | ICD-10-CM | POA: Diagnosis not present

## 2020-01-06 DIAGNOSIS — I69352 Hemiplegia and hemiparesis following cerebral infarction affecting left dominant side: Secondary | ICD-10-CM | POA: Diagnosis not present

## 2020-01-06 DIAGNOSIS — E1142 Type 2 diabetes mellitus with diabetic polyneuropathy: Secondary | ICD-10-CM | POA: Diagnosis not present

## 2020-01-06 DIAGNOSIS — J61 Pneumoconiosis due to asbestos and other mineral fibers: Secondary | ICD-10-CM | POA: Diagnosis not present

## 2020-01-06 DIAGNOSIS — N186 End stage renal disease: Secondary | ICD-10-CM | POA: Diagnosis not present

## 2020-01-06 DIAGNOSIS — E1122 Type 2 diabetes mellitus with diabetic chronic kidney disease: Secondary | ICD-10-CM | POA: Diagnosis not present

## 2020-01-07 DIAGNOSIS — N2581 Secondary hyperparathyroidism of renal origin: Secondary | ICD-10-CM | POA: Diagnosis not present

## 2020-01-07 DIAGNOSIS — Z992 Dependence on renal dialysis: Secondary | ICD-10-CM | POA: Diagnosis not present

## 2020-01-07 DIAGNOSIS — D631 Anemia in chronic kidney disease: Secondary | ICD-10-CM | POA: Diagnosis not present

## 2020-01-07 DIAGNOSIS — N186 End stage renal disease: Secondary | ICD-10-CM | POA: Diagnosis not present

## 2020-01-07 DIAGNOSIS — D509 Iron deficiency anemia, unspecified: Secondary | ICD-10-CM | POA: Diagnosis not present

## 2020-01-07 DIAGNOSIS — D500 Iron deficiency anemia secondary to blood loss (chronic): Secondary | ICD-10-CM | POA: Diagnosis not present

## 2020-01-08 DIAGNOSIS — E1142 Type 2 diabetes mellitus with diabetic polyneuropathy: Secondary | ICD-10-CM | POA: Diagnosis not present

## 2020-01-08 DIAGNOSIS — I69352 Hemiplegia and hemiparesis following cerebral infarction affecting left dominant side: Secondary | ICD-10-CM | POA: Diagnosis not present

## 2020-01-08 DIAGNOSIS — N186 End stage renal disease: Secondary | ICD-10-CM | POA: Diagnosis not present

## 2020-01-08 DIAGNOSIS — J61 Pneumoconiosis due to asbestos and other mineral fibers: Secondary | ICD-10-CM | POA: Diagnosis not present

## 2020-01-08 DIAGNOSIS — I10 Essential (primary) hypertension: Secondary | ICD-10-CM | POA: Diagnosis not present

## 2020-01-08 DIAGNOSIS — E1122 Type 2 diabetes mellitus with diabetic chronic kidney disease: Secondary | ICD-10-CM | POA: Diagnosis not present

## 2020-01-09 DIAGNOSIS — D500 Iron deficiency anemia secondary to blood loss (chronic): Secondary | ICD-10-CM | POA: Diagnosis not present

## 2020-01-09 DIAGNOSIS — N2581 Secondary hyperparathyroidism of renal origin: Secondary | ICD-10-CM | POA: Diagnosis not present

## 2020-01-09 DIAGNOSIS — Z992 Dependence on renal dialysis: Secondary | ICD-10-CM | POA: Diagnosis not present

## 2020-01-09 DIAGNOSIS — N186 End stage renal disease: Secondary | ICD-10-CM | POA: Diagnosis not present

## 2020-01-09 DIAGNOSIS — D631 Anemia in chronic kidney disease: Secondary | ICD-10-CM | POA: Diagnosis not present

## 2020-01-09 DIAGNOSIS — D509 Iron deficiency anemia, unspecified: Secondary | ICD-10-CM | POA: Diagnosis not present

## 2020-01-11 DIAGNOSIS — Z992 Dependence on renal dialysis: Secondary | ICD-10-CM | POA: Diagnosis not present

## 2020-01-11 DIAGNOSIS — Z299 Encounter for prophylactic measures, unspecified: Secondary | ICD-10-CM | POA: Diagnosis not present

## 2020-01-11 DIAGNOSIS — I10 Essential (primary) hypertension: Secondary | ICD-10-CM | POA: Diagnosis not present

## 2020-01-11 DIAGNOSIS — E1165 Type 2 diabetes mellitus with hyperglycemia: Secondary | ICD-10-CM | POA: Diagnosis not present

## 2020-01-11 DIAGNOSIS — Z6824 Body mass index (BMI) 24.0-24.9, adult: Secondary | ICD-10-CM | POA: Diagnosis not present

## 2020-01-11 DIAGNOSIS — E1122 Type 2 diabetes mellitus with diabetic chronic kidney disease: Secondary | ICD-10-CM | POA: Diagnosis not present

## 2020-01-12 DIAGNOSIS — N2581 Secondary hyperparathyroidism of renal origin: Secondary | ICD-10-CM | POA: Diagnosis not present

## 2020-01-12 DIAGNOSIS — Z992 Dependence on renal dialysis: Secondary | ICD-10-CM | POA: Diagnosis not present

## 2020-01-12 DIAGNOSIS — D631 Anemia in chronic kidney disease: Secondary | ICD-10-CM | POA: Diagnosis not present

## 2020-01-12 DIAGNOSIS — D500 Iron deficiency anemia secondary to blood loss (chronic): Secondary | ICD-10-CM | POA: Diagnosis not present

## 2020-01-12 DIAGNOSIS — D509 Iron deficiency anemia, unspecified: Secondary | ICD-10-CM | POA: Diagnosis not present

## 2020-01-12 DIAGNOSIS — N186 End stage renal disease: Secondary | ICD-10-CM | POA: Diagnosis not present

## 2020-01-13 DIAGNOSIS — Z992 Dependence on renal dialysis: Secondary | ICD-10-CM | POA: Diagnosis not present

## 2020-01-13 DIAGNOSIS — M86171 Other acute osteomyelitis, right ankle and foot: Secondary | ICD-10-CM | POA: Diagnosis not present

## 2020-01-13 DIAGNOSIS — L97512 Non-pressure chronic ulcer of other part of right foot with fat layer exposed: Secondary | ICD-10-CM | POA: Diagnosis not present

## 2020-01-13 DIAGNOSIS — E1140 Type 2 diabetes mellitus with diabetic neuropathy, unspecified: Secondary | ICD-10-CM | POA: Diagnosis not present

## 2020-01-13 DIAGNOSIS — Z89421 Acquired absence of other right toe(s): Secondary | ICD-10-CM | POA: Diagnosis not present

## 2020-01-13 DIAGNOSIS — I739 Peripheral vascular disease, unspecified: Secondary | ICD-10-CM | POA: Diagnosis not present

## 2020-01-14 DIAGNOSIS — D509 Iron deficiency anemia, unspecified: Secondary | ICD-10-CM | POA: Diagnosis not present

## 2020-01-14 DIAGNOSIS — Z992 Dependence on renal dialysis: Secondary | ICD-10-CM | POA: Diagnosis not present

## 2020-01-14 DIAGNOSIS — D631 Anemia in chronic kidney disease: Secondary | ICD-10-CM | POA: Diagnosis not present

## 2020-01-14 DIAGNOSIS — N2581 Secondary hyperparathyroidism of renal origin: Secondary | ICD-10-CM | POA: Diagnosis not present

## 2020-01-14 DIAGNOSIS — D500 Iron deficiency anemia secondary to blood loss (chronic): Secondary | ICD-10-CM | POA: Diagnosis not present

## 2020-01-14 DIAGNOSIS — N186 End stage renal disease: Secondary | ICD-10-CM | POA: Diagnosis not present

## 2020-01-15 DIAGNOSIS — E1142 Type 2 diabetes mellitus with diabetic polyneuropathy: Secondary | ICD-10-CM | POA: Diagnosis not present

## 2020-01-15 DIAGNOSIS — E1122 Type 2 diabetes mellitus with diabetic chronic kidney disease: Secondary | ICD-10-CM | POA: Diagnosis not present

## 2020-01-15 DIAGNOSIS — I69352 Hemiplegia and hemiparesis following cerebral infarction affecting left dominant side: Secondary | ICD-10-CM | POA: Diagnosis not present

## 2020-01-15 DIAGNOSIS — N186 End stage renal disease: Secondary | ICD-10-CM | POA: Diagnosis not present

## 2020-01-15 DIAGNOSIS — I10 Essential (primary) hypertension: Secondary | ICD-10-CM | POA: Diagnosis not present

## 2020-01-15 DIAGNOSIS — J61 Pneumoconiosis due to asbestos and other mineral fibers: Secondary | ICD-10-CM | POA: Diagnosis not present

## 2020-01-16 DIAGNOSIS — Z992 Dependence on renal dialysis: Secondary | ICD-10-CM | POA: Diagnosis not present

## 2020-01-16 DIAGNOSIS — D500 Iron deficiency anemia secondary to blood loss (chronic): Secondary | ICD-10-CM | POA: Diagnosis not present

## 2020-01-16 DIAGNOSIS — N186 End stage renal disease: Secondary | ICD-10-CM | POA: Diagnosis not present

## 2020-01-16 DIAGNOSIS — D631 Anemia in chronic kidney disease: Secondary | ICD-10-CM | POA: Diagnosis not present

## 2020-01-16 DIAGNOSIS — D509 Iron deficiency anemia, unspecified: Secondary | ICD-10-CM | POA: Diagnosis not present

## 2020-01-16 DIAGNOSIS — N2581 Secondary hyperparathyroidism of renal origin: Secondary | ICD-10-CM | POA: Diagnosis not present

## 2020-01-18 DIAGNOSIS — E1122 Type 2 diabetes mellitus with diabetic chronic kidney disease: Secondary | ICD-10-CM | POA: Diagnosis not present

## 2020-01-18 DIAGNOSIS — N186 End stage renal disease: Secondary | ICD-10-CM | POA: Diagnosis not present

## 2020-01-18 DIAGNOSIS — J61 Pneumoconiosis due to asbestos and other mineral fibers: Secondary | ICD-10-CM | POA: Diagnosis not present

## 2020-01-18 DIAGNOSIS — I69352 Hemiplegia and hemiparesis following cerebral infarction affecting left dominant side: Secondary | ICD-10-CM | POA: Diagnosis not present

## 2020-01-19 DIAGNOSIS — Z992 Dependence on renal dialysis: Secondary | ICD-10-CM | POA: Diagnosis not present

## 2020-01-19 DIAGNOSIS — N186 End stage renal disease: Secondary | ICD-10-CM | POA: Diagnosis not present

## 2020-01-19 DIAGNOSIS — D500 Iron deficiency anemia secondary to blood loss (chronic): Secondary | ICD-10-CM | POA: Diagnosis not present

## 2020-01-19 DIAGNOSIS — D631 Anemia in chronic kidney disease: Secondary | ICD-10-CM | POA: Diagnosis not present

## 2020-01-19 DIAGNOSIS — N2581 Secondary hyperparathyroidism of renal origin: Secondary | ICD-10-CM | POA: Diagnosis not present

## 2020-01-19 DIAGNOSIS — D509 Iron deficiency anemia, unspecified: Secondary | ICD-10-CM | POA: Diagnosis not present

## 2020-01-20 DIAGNOSIS — N186 End stage renal disease: Secondary | ICD-10-CM | POA: Diagnosis not present

## 2020-01-20 DIAGNOSIS — E1122 Type 2 diabetes mellitus with diabetic chronic kidney disease: Secondary | ICD-10-CM | POA: Diagnosis not present

## 2020-01-20 DIAGNOSIS — E1142 Type 2 diabetes mellitus with diabetic polyneuropathy: Secondary | ICD-10-CM | POA: Diagnosis not present

## 2020-01-20 DIAGNOSIS — I10 Essential (primary) hypertension: Secondary | ICD-10-CM | POA: Diagnosis not present

## 2020-01-20 DIAGNOSIS — I69352 Hemiplegia and hemiparesis following cerebral infarction affecting left dominant side: Secondary | ICD-10-CM | POA: Diagnosis not present

## 2020-01-20 DIAGNOSIS — J61 Pneumoconiosis due to asbestos and other mineral fibers: Secondary | ICD-10-CM | POA: Diagnosis not present

## 2020-01-21 DIAGNOSIS — N2581 Secondary hyperparathyroidism of renal origin: Secondary | ICD-10-CM | POA: Diagnosis not present

## 2020-01-21 DIAGNOSIS — N186 End stage renal disease: Secondary | ICD-10-CM | POA: Diagnosis not present

## 2020-01-21 DIAGNOSIS — D500 Iron deficiency anemia secondary to blood loss (chronic): Secondary | ICD-10-CM | POA: Diagnosis not present

## 2020-01-21 DIAGNOSIS — D631 Anemia in chronic kidney disease: Secondary | ICD-10-CM | POA: Diagnosis not present

## 2020-01-21 DIAGNOSIS — D509 Iron deficiency anemia, unspecified: Secondary | ICD-10-CM | POA: Diagnosis not present

## 2020-01-21 DIAGNOSIS — Z992 Dependence on renal dialysis: Secondary | ICD-10-CM | POA: Diagnosis not present

## 2020-01-23 DIAGNOSIS — D509 Iron deficiency anemia, unspecified: Secondary | ICD-10-CM | POA: Diagnosis not present

## 2020-01-23 DIAGNOSIS — Z992 Dependence on renal dialysis: Secondary | ICD-10-CM | POA: Diagnosis not present

## 2020-01-23 DIAGNOSIS — D631 Anemia in chronic kidney disease: Secondary | ICD-10-CM | POA: Diagnosis not present

## 2020-01-23 DIAGNOSIS — D500 Iron deficiency anemia secondary to blood loss (chronic): Secondary | ICD-10-CM | POA: Diagnosis not present

## 2020-01-23 DIAGNOSIS — N186 End stage renal disease: Secondary | ICD-10-CM | POA: Diagnosis not present

## 2020-01-23 DIAGNOSIS — N2581 Secondary hyperparathyroidism of renal origin: Secondary | ICD-10-CM | POA: Diagnosis not present

## 2020-01-25 DIAGNOSIS — Z992 Dependence on renal dialysis: Secondary | ICD-10-CM | POA: Diagnosis not present

## 2020-01-25 DIAGNOSIS — E1140 Type 2 diabetes mellitus with diabetic neuropathy, unspecified: Secondary | ICD-10-CM | POA: Diagnosis not present

## 2020-01-25 DIAGNOSIS — M86171 Other acute osteomyelitis, right ankle and foot: Secondary | ICD-10-CM | POA: Diagnosis not present

## 2020-01-25 DIAGNOSIS — L97512 Non-pressure chronic ulcer of other part of right foot with fat layer exposed: Secondary | ICD-10-CM | POA: Diagnosis not present

## 2020-01-25 DIAGNOSIS — I739 Peripheral vascular disease, unspecified: Secondary | ICD-10-CM | POA: Diagnosis not present

## 2020-01-25 DIAGNOSIS — Z89421 Acquired absence of other right toe(s): Secondary | ICD-10-CM | POA: Diagnosis not present

## 2020-01-26 DIAGNOSIS — N2581 Secondary hyperparathyroidism of renal origin: Secondary | ICD-10-CM | POA: Diagnosis not present

## 2020-01-26 DIAGNOSIS — D631 Anemia in chronic kidney disease: Secondary | ICD-10-CM | POA: Diagnosis not present

## 2020-01-26 DIAGNOSIS — D509 Iron deficiency anemia, unspecified: Secondary | ICD-10-CM | POA: Diagnosis not present

## 2020-01-26 DIAGNOSIS — N186 End stage renal disease: Secondary | ICD-10-CM | POA: Diagnosis not present

## 2020-01-26 DIAGNOSIS — Z992 Dependence on renal dialysis: Secondary | ICD-10-CM | POA: Diagnosis not present

## 2020-01-26 DIAGNOSIS — D500 Iron deficiency anemia secondary to blood loss (chronic): Secondary | ICD-10-CM | POA: Diagnosis not present

## 2020-01-27 DIAGNOSIS — I10 Essential (primary) hypertension: Secondary | ICD-10-CM | POA: Diagnosis not present

## 2020-01-27 DIAGNOSIS — E1122 Type 2 diabetes mellitus with diabetic chronic kidney disease: Secondary | ICD-10-CM | POA: Diagnosis not present

## 2020-01-27 DIAGNOSIS — J61 Pneumoconiosis due to asbestos and other mineral fibers: Secondary | ICD-10-CM | POA: Diagnosis not present

## 2020-01-27 DIAGNOSIS — I69352 Hemiplegia and hemiparesis following cerebral infarction affecting left dominant side: Secondary | ICD-10-CM | POA: Diagnosis not present

## 2020-01-27 DIAGNOSIS — E1142 Type 2 diabetes mellitus with diabetic polyneuropathy: Secondary | ICD-10-CM | POA: Diagnosis not present

## 2020-01-27 DIAGNOSIS — N186 End stage renal disease: Secondary | ICD-10-CM | POA: Diagnosis not present

## 2020-01-28 DIAGNOSIS — D500 Iron deficiency anemia secondary to blood loss (chronic): Secondary | ICD-10-CM | POA: Diagnosis not present

## 2020-01-28 DIAGNOSIS — N186 End stage renal disease: Secondary | ICD-10-CM | POA: Diagnosis not present

## 2020-01-28 DIAGNOSIS — D631 Anemia in chronic kidney disease: Secondary | ICD-10-CM | POA: Diagnosis not present

## 2020-01-28 DIAGNOSIS — N2581 Secondary hyperparathyroidism of renal origin: Secondary | ICD-10-CM | POA: Diagnosis not present

## 2020-01-28 DIAGNOSIS — Z992 Dependence on renal dialysis: Secondary | ICD-10-CM | POA: Diagnosis not present

## 2020-01-28 DIAGNOSIS — D509 Iron deficiency anemia, unspecified: Secondary | ICD-10-CM | POA: Diagnosis not present

## 2020-01-29 DIAGNOSIS — Z794 Long term (current) use of insulin: Secondary | ICD-10-CM | POA: Diagnosis not present

## 2020-01-29 DIAGNOSIS — E1142 Type 2 diabetes mellitus with diabetic polyneuropathy: Secondary | ICD-10-CM | POA: Diagnosis not present

## 2020-01-29 DIAGNOSIS — E1122 Type 2 diabetes mellitus with diabetic chronic kidney disease: Secondary | ICD-10-CM | POA: Diagnosis not present

## 2020-01-29 DIAGNOSIS — Z992 Dependence on renal dialysis: Secondary | ICD-10-CM | POA: Diagnosis not present

## 2020-01-29 DIAGNOSIS — N186 End stage renal disease: Secondary | ICD-10-CM | POA: Diagnosis not present

## 2020-01-29 DIAGNOSIS — E1165 Type 2 diabetes mellitus with hyperglycemia: Secondary | ICD-10-CM | POA: Diagnosis not present

## 2020-01-29 DIAGNOSIS — I69352 Hemiplegia and hemiparesis following cerebral infarction affecting left dominant side: Secondary | ICD-10-CM | POA: Diagnosis not present

## 2020-01-29 DIAGNOSIS — I10 Essential (primary) hypertension: Secondary | ICD-10-CM | POA: Diagnosis not present

## 2020-01-29 DIAGNOSIS — J61 Pneumoconiosis due to asbestos and other mineral fibers: Secondary | ICD-10-CM | POA: Diagnosis not present

## 2020-01-30 DIAGNOSIS — N2581 Secondary hyperparathyroidism of renal origin: Secondary | ICD-10-CM | POA: Diagnosis not present

## 2020-01-30 DIAGNOSIS — Z992 Dependence on renal dialysis: Secondary | ICD-10-CM | POA: Diagnosis not present

## 2020-01-30 DIAGNOSIS — D509 Iron deficiency anemia, unspecified: Secondary | ICD-10-CM | POA: Diagnosis not present

## 2020-01-30 DIAGNOSIS — D631 Anemia in chronic kidney disease: Secondary | ICD-10-CM | POA: Diagnosis not present

## 2020-01-30 DIAGNOSIS — D500 Iron deficiency anemia secondary to blood loss (chronic): Secondary | ICD-10-CM | POA: Diagnosis not present

## 2020-01-30 DIAGNOSIS — N186 End stage renal disease: Secondary | ICD-10-CM | POA: Diagnosis not present

## 2020-01-31 DIAGNOSIS — N186 End stage renal disease: Secondary | ICD-10-CM | POA: Diagnosis not present

## 2020-01-31 DIAGNOSIS — Z992 Dependence on renal dialysis: Secondary | ICD-10-CM | POA: Diagnosis not present

## 2020-02-01 DIAGNOSIS — N186 End stage renal disease: Secondary | ICD-10-CM | POA: Diagnosis not present

## 2020-02-01 DIAGNOSIS — E1122 Type 2 diabetes mellitus with diabetic chronic kidney disease: Secondary | ICD-10-CM | POA: Diagnosis not present

## 2020-02-01 DIAGNOSIS — J61 Pneumoconiosis due to asbestos and other mineral fibers: Secondary | ICD-10-CM | POA: Diagnosis not present

## 2020-02-01 DIAGNOSIS — I10 Essential (primary) hypertension: Secondary | ICD-10-CM | POA: Diagnosis not present

## 2020-02-01 DIAGNOSIS — I69352 Hemiplegia and hemiparesis following cerebral infarction affecting left dominant side: Secondary | ICD-10-CM | POA: Diagnosis not present

## 2020-02-01 DIAGNOSIS — E1142 Type 2 diabetes mellitus with diabetic polyneuropathy: Secondary | ICD-10-CM | POA: Diagnosis not present

## 2020-02-02 DIAGNOSIS — D509 Iron deficiency anemia, unspecified: Secondary | ICD-10-CM | POA: Diagnosis not present

## 2020-02-02 DIAGNOSIS — D631 Anemia in chronic kidney disease: Secondary | ICD-10-CM | POA: Diagnosis not present

## 2020-02-02 DIAGNOSIS — Z992 Dependence on renal dialysis: Secondary | ICD-10-CM | POA: Diagnosis not present

## 2020-02-02 DIAGNOSIS — D500 Iron deficiency anemia secondary to blood loss (chronic): Secondary | ICD-10-CM | POA: Diagnosis not present

## 2020-02-02 DIAGNOSIS — N186 End stage renal disease: Secondary | ICD-10-CM | POA: Diagnosis not present

## 2020-02-02 DIAGNOSIS — E1129 Type 2 diabetes mellitus with other diabetic kidney complication: Secondary | ICD-10-CM | POA: Diagnosis not present

## 2020-02-02 DIAGNOSIS — N2581 Secondary hyperparathyroidism of renal origin: Secondary | ICD-10-CM | POA: Diagnosis not present

## 2020-02-03 DIAGNOSIS — E1122 Type 2 diabetes mellitus with diabetic chronic kidney disease: Secondary | ICD-10-CM | POA: Diagnosis not present

## 2020-02-03 DIAGNOSIS — E1142 Type 2 diabetes mellitus with diabetic polyneuropathy: Secondary | ICD-10-CM | POA: Diagnosis not present

## 2020-02-03 DIAGNOSIS — N186 End stage renal disease: Secondary | ICD-10-CM | POA: Diagnosis not present

## 2020-02-03 DIAGNOSIS — I69352 Hemiplegia and hemiparesis following cerebral infarction affecting left dominant side: Secondary | ICD-10-CM | POA: Diagnosis not present

## 2020-02-03 DIAGNOSIS — J61 Pneumoconiosis due to asbestos and other mineral fibers: Secondary | ICD-10-CM | POA: Diagnosis not present

## 2020-02-03 DIAGNOSIS — I10 Essential (primary) hypertension: Secondary | ICD-10-CM | POA: Diagnosis not present

## 2020-02-04 DIAGNOSIS — N2581 Secondary hyperparathyroidism of renal origin: Secondary | ICD-10-CM | POA: Diagnosis not present

## 2020-02-04 DIAGNOSIS — E1129 Type 2 diabetes mellitus with other diabetic kidney complication: Secondary | ICD-10-CM | POA: Diagnosis not present

## 2020-02-04 DIAGNOSIS — D509 Iron deficiency anemia, unspecified: Secondary | ICD-10-CM | POA: Diagnosis not present

## 2020-02-04 DIAGNOSIS — D500 Iron deficiency anemia secondary to blood loss (chronic): Secondary | ICD-10-CM | POA: Diagnosis not present

## 2020-02-04 DIAGNOSIS — Z992 Dependence on renal dialysis: Secondary | ICD-10-CM | POA: Diagnosis not present

## 2020-02-04 DIAGNOSIS — N186 End stage renal disease: Secondary | ICD-10-CM | POA: Diagnosis not present

## 2020-02-06 DIAGNOSIS — N2581 Secondary hyperparathyroidism of renal origin: Secondary | ICD-10-CM | POA: Diagnosis not present

## 2020-02-06 DIAGNOSIS — Z992 Dependence on renal dialysis: Secondary | ICD-10-CM | POA: Diagnosis not present

## 2020-02-06 DIAGNOSIS — N186 End stage renal disease: Secondary | ICD-10-CM | POA: Diagnosis not present

## 2020-02-06 DIAGNOSIS — D509 Iron deficiency anemia, unspecified: Secondary | ICD-10-CM | POA: Diagnosis not present

## 2020-02-06 DIAGNOSIS — E1129 Type 2 diabetes mellitus with other diabetic kidney complication: Secondary | ICD-10-CM | POA: Diagnosis not present

## 2020-02-06 DIAGNOSIS — D500 Iron deficiency anemia secondary to blood loss (chronic): Secondary | ICD-10-CM | POA: Diagnosis not present

## 2020-02-08 DIAGNOSIS — N186 End stage renal disease: Secondary | ICD-10-CM | POA: Diagnosis not present

## 2020-02-08 DIAGNOSIS — I69352 Hemiplegia and hemiparesis following cerebral infarction affecting left dominant side: Secondary | ICD-10-CM | POA: Diagnosis not present

## 2020-02-08 DIAGNOSIS — E1122 Type 2 diabetes mellitus with diabetic chronic kidney disease: Secondary | ICD-10-CM | POA: Diagnosis not present

## 2020-02-08 DIAGNOSIS — J61 Pneumoconiosis due to asbestos and other mineral fibers: Secondary | ICD-10-CM | POA: Diagnosis not present

## 2020-02-08 DIAGNOSIS — E1142 Type 2 diabetes mellitus with diabetic polyneuropathy: Secondary | ICD-10-CM | POA: Diagnosis not present

## 2020-02-08 DIAGNOSIS — I10 Essential (primary) hypertension: Secondary | ICD-10-CM | POA: Diagnosis not present

## 2020-02-09 DIAGNOSIS — Z992 Dependence on renal dialysis: Secondary | ICD-10-CM | POA: Diagnosis not present

## 2020-02-09 DIAGNOSIS — E1129 Type 2 diabetes mellitus with other diabetic kidney complication: Secondary | ICD-10-CM | POA: Diagnosis not present

## 2020-02-09 DIAGNOSIS — N2581 Secondary hyperparathyroidism of renal origin: Secondary | ICD-10-CM | POA: Diagnosis not present

## 2020-02-09 DIAGNOSIS — D500 Iron deficiency anemia secondary to blood loss (chronic): Secondary | ICD-10-CM | POA: Diagnosis not present

## 2020-02-09 DIAGNOSIS — D509 Iron deficiency anemia, unspecified: Secondary | ICD-10-CM | POA: Diagnosis not present

## 2020-02-09 DIAGNOSIS — N186 End stage renal disease: Secondary | ICD-10-CM | POA: Diagnosis not present

## 2020-02-10 DIAGNOSIS — Z992 Dependence on renal dialysis: Secondary | ICD-10-CM | POA: Diagnosis not present

## 2020-02-10 DIAGNOSIS — E1140 Type 2 diabetes mellitus with diabetic neuropathy, unspecified: Secondary | ICD-10-CM | POA: Diagnosis not present

## 2020-02-10 DIAGNOSIS — L97512 Non-pressure chronic ulcer of other part of right foot with fat layer exposed: Secondary | ICD-10-CM | POA: Diagnosis not present

## 2020-02-10 DIAGNOSIS — M86171 Other acute osteomyelitis, right ankle and foot: Secondary | ICD-10-CM | POA: Diagnosis not present

## 2020-02-10 DIAGNOSIS — Z89421 Acquired absence of other right toe(s): Secondary | ICD-10-CM | POA: Diagnosis not present

## 2020-02-11 DIAGNOSIS — E1129 Type 2 diabetes mellitus with other diabetic kidney complication: Secondary | ICD-10-CM | POA: Diagnosis not present

## 2020-02-11 DIAGNOSIS — N2581 Secondary hyperparathyroidism of renal origin: Secondary | ICD-10-CM | POA: Diagnosis not present

## 2020-02-11 DIAGNOSIS — D500 Iron deficiency anemia secondary to blood loss (chronic): Secondary | ICD-10-CM | POA: Diagnosis not present

## 2020-02-11 DIAGNOSIS — D509 Iron deficiency anemia, unspecified: Secondary | ICD-10-CM | POA: Diagnosis not present

## 2020-02-11 DIAGNOSIS — N186 End stage renal disease: Secondary | ICD-10-CM | POA: Diagnosis not present

## 2020-02-11 DIAGNOSIS — Z992 Dependence on renal dialysis: Secondary | ICD-10-CM | POA: Diagnosis not present

## 2020-02-12 DIAGNOSIS — N186 End stage renal disease: Secondary | ICD-10-CM | POA: Diagnosis not present

## 2020-02-12 DIAGNOSIS — E1142 Type 2 diabetes mellitus with diabetic polyneuropathy: Secondary | ICD-10-CM | POA: Diagnosis not present

## 2020-02-12 DIAGNOSIS — I10 Essential (primary) hypertension: Secondary | ICD-10-CM | POA: Diagnosis not present

## 2020-02-12 DIAGNOSIS — E1122 Type 2 diabetes mellitus with diabetic chronic kidney disease: Secondary | ICD-10-CM | POA: Diagnosis not present

## 2020-02-12 DIAGNOSIS — J61 Pneumoconiosis due to asbestos and other mineral fibers: Secondary | ICD-10-CM | POA: Diagnosis not present

## 2020-02-12 DIAGNOSIS — I69352 Hemiplegia and hemiparesis following cerebral infarction affecting left dominant side: Secondary | ICD-10-CM | POA: Diagnosis not present

## 2020-02-13 DIAGNOSIS — E1129 Type 2 diabetes mellitus with other diabetic kidney complication: Secondary | ICD-10-CM | POA: Diagnosis not present

## 2020-02-13 DIAGNOSIS — N2581 Secondary hyperparathyroidism of renal origin: Secondary | ICD-10-CM | POA: Diagnosis not present

## 2020-02-13 DIAGNOSIS — D500 Iron deficiency anemia secondary to blood loss (chronic): Secondary | ICD-10-CM | POA: Diagnosis not present

## 2020-02-13 DIAGNOSIS — Z992 Dependence on renal dialysis: Secondary | ICD-10-CM | POA: Diagnosis not present

## 2020-02-13 DIAGNOSIS — N186 End stage renal disease: Secondary | ICD-10-CM | POA: Diagnosis not present

## 2020-02-13 DIAGNOSIS — D509 Iron deficiency anemia, unspecified: Secondary | ICD-10-CM | POA: Diagnosis not present

## 2020-02-16 DIAGNOSIS — N2581 Secondary hyperparathyroidism of renal origin: Secondary | ICD-10-CM | POA: Diagnosis not present

## 2020-02-16 DIAGNOSIS — D500 Iron deficiency anemia secondary to blood loss (chronic): Secondary | ICD-10-CM | POA: Diagnosis not present

## 2020-02-16 DIAGNOSIS — N186 End stage renal disease: Secondary | ICD-10-CM | POA: Diagnosis not present

## 2020-02-16 DIAGNOSIS — D509 Iron deficiency anemia, unspecified: Secondary | ICD-10-CM | POA: Diagnosis not present

## 2020-02-16 DIAGNOSIS — Z992 Dependence on renal dialysis: Secondary | ICD-10-CM | POA: Diagnosis not present

## 2020-02-16 DIAGNOSIS — E1129 Type 2 diabetes mellitus with other diabetic kidney complication: Secondary | ICD-10-CM | POA: Diagnosis not present

## 2020-02-18 DIAGNOSIS — D509 Iron deficiency anemia, unspecified: Secondary | ICD-10-CM | POA: Diagnosis not present

## 2020-02-18 DIAGNOSIS — E1129 Type 2 diabetes mellitus with other diabetic kidney complication: Secondary | ICD-10-CM | POA: Diagnosis not present

## 2020-02-18 DIAGNOSIS — N186 End stage renal disease: Secondary | ICD-10-CM | POA: Diagnosis not present

## 2020-02-18 DIAGNOSIS — Z992 Dependence on renal dialysis: Secondary | ICD-10-CM | POA: Diagnosis not present

## 2020-02-18 DIAGNOSIS — D500 Iron deficiency anemia secondary to blood loss (chronic): Secondary | ICD-10-CM | POA: Diagnosis not present

## 2020-02-18 DIAGNOSIS — N2581 Secondary hyperparathyroidism of renal origin: Secondary | ICD-10-CM | POA: Diagnosis not present

## 2020-02-19 DIAGNOSIS — E1142 Type 2 diabetes mellitus with diabetic polyneuropathy: Secondary | ICD-10-CM | POA: Diagnosis not present

## 2020-02-19 DIAGNOSIS — I10 Essential (primary) hypertension: Secondary | ICD-10-CM | POA: Diagnosis not present

## 2020-02-19 DIAGNOSIS — N186 End stage renal disease: Secondary | ICD-10-CM | POA: Diagnosis not present

## 2020-02-19 DIAGNOSIS — I69352 Hemiplegia and hemiparesis following cerebral infarction affecting left dominant side: Secondary | ICD-10-CM | POA: Diagnosis not present

## 2020-02-19 DIAGNOSIS — J61 Pneumoconiosis due to asbestos and other mineral fibers: Secondary | ICD-10-CM | POA: Diagnosis not present

## 2020-02-19 DIAGNOSIS — E1122 Type 2 diabetes mellitus with diabetic chronic kidney disease: Secondary | ICD-10-CM | POA: Diagnosis not present

## 2020-02-20 DIAGNOSIS — N186 End stage renal disease: Secondary | ICD-10-CM | POA: Diagnosis not present

## 2020-02-20 DIAGNOSIS — D500 Iron deficiency anemia secondary to blood loss (chronic): Secondary | ICD-10-CM | POA: Diagnosis not present

## 2020-02-20 DIAGNOSIS — E1129 Type 2 diabetes mellitus with other diabetic kidney complication: Secondary | ICD-10-CM | POA: Diagnosis not present

## 2020-02-20 DIAGNOSIS — Z992 Dependence on renal dialysis: Secondary | ICD-10-CM | POA: Diagnosis not present

## 2020-02-20 DIAGNOSIS — N2581 Secondary hyperparathyroidism of renal origin: Secondary | ICD-10-CM | POA: Diagnosis not present

## 2020-02-20 DIAGNOSIS — D509 Iron deficiency anemia, unspecified: Secondary | ICD-10-CM | POA: Diagnosis not present

## 2020-02-21 DIAGNOSIS — I70245 Atherosclerosis of native arteries of left leg with ulceration of other part of foot: Secondary | ICD-10-CM | POA: Diagnosis not present

## 2020-02-22 DIAGNOSIS — E1129 Type 2 diabetes mellitus with other diabetic kidney complication: Secondary | ICD-10-CM | POA: Diagnosis not present

## 2020-02-22 DIAGNOSIS — N2581 Secondary hyperparathyroidism of renal origin: Secondary | ICD-10-CM | POA: Diagnosis not present

## 2020-02-22 DIAGNOSIS — Z992 Dependence on renal dialysis: Secondary | ICD-10-CM | POA: Diagnosis not present

## 2020-02-22 DIAGNOSIS — D500 Iron deficiency anemia secondary to blood loss (chronic): Secondary | ICD-10-CM | POA: Diagnosis not present

## 2020-02-22 DIAGNOSIS — D509 Iron deficiency anemia, unspecified: Secondary | ICD-10-CM | POA: Diagnosis not present

## 2020-02-22 DIAGNOSIS — N186 End stage renal disease: Secondary | ICD-10-CM | POA: Diagnosis not present

## 2020-02-23 DIAGNOSIS — E1140 Type 2 diabetes mellitus with diabetic neuropathy, unspecified: Secondary | ICD-10-CM | POA: Diagnosis not present

## 2020-02-23 DIAGNOSIS — M79671 Pain in right foot: Secondary | ICD-10-CM | POA: Diagnosis not present

## 2020-02-23 DIAGNOSIS — L97512 Non-pressure chronic ulcer of other part of right foot with fat layer exposed: Secondary | ICD-10-CM | POA: Diagnosis not present

## 2020-02-23 DIAGNOSIS — I739 Peripheral vascular disease, unspecified: Secondary | ICD-10-CM | POA: Diagnosis not present

## 2020-02-24 DIAGNOSIS — E1129 Type 2 diabetes mellitus with other diabetic kidney complication: Secondary | ICD-10-CM | POA: Diagnosis not present

## 2020-02-24 DIAGNOSIS — D500 Iron deficiency anemia secondary to blood loss (chronic): Secondary | ICD-10-CM | POA: Diagnosis not present

## 2020-02-24 DIAGNOSIS — N2581 Secondary hyperparathyroidism of renal origin: Secondary | ICD-10-CM | POA: Diagnosis not present

## 2020-02-24 DIAGNOSIS — N186 End stage renal disease: Secondary | ICD-10-CM | POA: Diagnosis not present

## 2020-02-24 DIAGNOSIS — Z992 Dependence on renal dialysis: Secondary | ICD-10-CM | POA: Diagnosis not present

## 2020-02-24 DIAGNOSIS — D509 Iron deficiency anemia, unspecified: Secondary | ICD-10-CM | POA: Diagnosis not present

## 2020-02-27 DIAGNOSIS — Z992 Dependence on renal dialysis: Secondary | ICD-10-CM | POA: Diagnosis not present

## 2020-02-27 DIAGNOSIS — N2581 Secondary hyperparathyroidism of renal origin: Secondary | ICD-10-CM | POA: Diagnosis not present

## 2020-02-27 DIAGNOSIS — D500 Iron deficiency anemia secondary to blood loss (chronic): Secondary | ICD-10-CM | POA: Diagnosis not present

## 2020-02-27 DIAGNOSIS — N186 End stage renal disease: Secondary | ICD-10-CM | POA: Diagnosis not present

## 2020-02-27 DIAGNOSIS — E1129 Type 2 diabetes mellitus with other diabetic kidney complication: Secondary | ICD-10-CM | POA: Diagnosis not present

## 2020-02-27 DIAGNOSIS — D509 Iron deficiency anemia, unspecified: Secondary | ICD-10-CM | POA: Diagnosis not present

## 2020-03-01 DIAGNOSIS — M868X7 Other osteomyelitis, ankle and foot: Secondary | ICD-10-CM | POA: Diagnosis not present

## 2020-03-01 DIAGNOSIS — E1122 Type 2 diabetes mellitus with diabetic chronic kidney disease: Secondary | ICD-10-CM | POA: Diagnosis not present

## 2020-03-01 DIAGNOSIS — E1169 Type 2 diabetes mellitus with other specified complication: Secondary | ICD-10-CM | POA: Diagnosis not present

## 2020-03-01 DIAGNOSIS — Z89421 Acquired absence of other right toe(s): Secondary | ICD-10-CM | POA: Diagnosis not present

## 2020-03-01 DIAGNOSIS — R531 Weakness: Secondary | ICD-10-CM | POA: Diagnosis not present

## 2020-03-01 DIAGNOSIS — E1140 Type 2 diabetes mellitus with diabetic neuropathy, unspecified: Secondary | ICD-10-CM | POA: Diagnosis not present

## 2020-03-01 DIAGNOSIS — R7401 Elevation of levels of liver transaminase levels: Secondary | ICD-10-CM | POA: Diagnosis present

## 2020-03-01 DIAGNOSIS — R404 Transient alteration of awareness: Secondary | ICD-10-CM | POA: Diagnosis not present

## 2020-03-01 DIAGNOSIS — E1129 Type 2 diabetes mellitus with other diabetic kidney complication: Secondary | ICD-10-CM | POA: Diagnosis not present

## 2020-03-01 DIAGNOSIS — M79671 Pain in right foot: Secondary | ICD-10-CM | POA: Diagnosis not present

## 2020-03-01 DIAGNOSIS — I739 Peripheral vascular disease, unspecified: Secondary | ICD-10-CM | POA: Diagnosis not present

## 2020-03-01 DIAGNOSIS — Z8673 Personal history of transient ischemic attack (TIA), and cerebral infarction without residual deficits: Secondary | ICD-10-CM | POA: Diagnosis not present

## 2020-03-01 DIAGNOSIS — D631 Anemia in chronic kidney disease: Secondary | ICD-10-CM | POA: Diagnosis present

## 2020-03-01 DIAGNOSIS — I160 Hypertensive urgency: Secondary | ICD-10-CM | POA: Diagnosis present

## 2020-03-01 DIAGNOSIS — E11319 Type 2 diabetes mellitus with unspecified diabetic retinopathy without macular edema: Secondary | ICD-10-CM | POA: Diagnosis not present

## 2020-03-01 DIAGNOSIS — N186 End stage renal disease: Secondary | ICD-10-CM | POA: Diagnosis not present

## 2020-03-01 DIAGNOSIS — E1151 Type 2 diabetes mellitus with diabetic peripheral angiopathy without gangrene: Secondary | ICD-10-CM | POA: Diagnosis not present

## 2020-03-01 DIAGNOSIS — I10 Essential (primary) hypertension: Secondary | ICD-10-CM | POA: Diagnosis not present

## 2020-03-01 DIAGNOSIS — E785 Hyperlipidemia, unspecified: Secondary | ICD-10-CM | POA: Diagnosis present

## 2020-03-01 DIAGNOSIS — I2510 Atherosclerotic heart disease of native coronary artery without angina pectoris: Secondary | ICD-10-CM | POA: Diagnosis present

## 2020-03-01 DIAGNOSIS — Z992 Dependence on renal dialysis: Secondary | ICD-10-CM | POA: Diagnosis not present

## 2020-03-01 DIAGNOSIS — I120 Hypertensive chronic kidney disease with stage 5 chronic kidney disease or end stage renal disease: Secondary | ICD-10-CM | POA: Diagnosis not present

## 2020-03-01 DIAGNOSIS — E11621 Type 2 diabetes mellitus with foot ulcer: Secondary | ICD-10-CM | POA: Diagnosis not present

## 2020-03-01 DIAGNOSIS — R4182 Altered mental status, unspecified: Secondary | ICD-10-CM | POA: Diagnosis not present

## 2020-03-01 DIAGNOSIS — M86071 Acute hematogenous osteomyelitis, right ankle and foot: Secondary | ICD-10-CM | POA: Diagnosis not present

## 2020-03-01 DIAGNOSIS — Z794 Long term (current) use of insulin: Secondary | ICD-10-CM | POA: Diagnosis not present

## 2020-03-01 DIAGNOSIS — M869 Osteomyelitis, unspecified: Secondary | ICD-10-CM | POA: Diagnosis not present

## 2020-03-01 DIAGNOSIS — Z743 Need for continuous supervision: Secondary | ICD-10-CM | POA: Diagnosis not present

## 2020-03-01 DIAGNOSIS — R1111 Vomiting without nausea: Secondary | ICD-10-CM | POA: Diagnosis not present

## 2020-03-01 DIAGNOSIS — E1142 Type 2 diabetes mellitus with diabetic polyneuropathy: Secondary | ICD-10-CM | POA: Diagnosis not present

## 2020-03-01 DIAGNOSIS — R6889 Other general symptoms and signs: Secondary | ICD-10-CM | POA: Diagnosis not present

## 2020-03-01 DIAGNOSIS — L97512 Non-pressure chronic ulcer of other part of right foot with fat layer exposed: Secondary | ICD-10-CM | POA: Diagnosis not present

## 2020-03-01 DIAGNOSIS — L97519 Non-pressure chronic ulcer of other part of right foot with unspecified severity: Secondary | ICD-10-CM | POA: Diagnosis not present

## 2020-03-05 DIAGNOSIS — N186 End stage renal disease: Secondary | ICD-10-CM | POA: Diagnosis not present

## 2020-03-05 DIAGNOSIS — D509 Iron deficiency anemia, unspecified: Secondary | ICD-10-CM | POA: Diagnosis not present

## 2020-03-05 DIAGNOSIS — Z992 Dependence on renal dialysis: Secondary | ICD-10-CM | POA: Diagnosis not present

## 2020-03-05 DIAGNOSIS — M869 Osteomyelitis, unspecified: Secondary | ICD-10-CM | POA: Diagnosis not present

## 2020-03-05 DIAGNOSIS — E1129 Type 2 diabetes mellitus with other diabetic kidney complication: Secondary | ICD-10-CM | POA: Diagnosis not present

## 2020-03-05 DIAGNOSIS — D631 Anemia in chronic kidney disease: Secondary | ICD-10-CM | POA: Diagnosis not present

## 2020-03-05 DIAGNOSIS — N2581 Secondary hyperparathyroidism of renal origin: Secondary | ICD-10-CM | POA: Diagnosis not present

## 2020-03-05 DIAGNOSIS — D500 Iron deficiency anemia secondary to blood loss (chronic): Secondary | ICD-10-CM | POA: Diagnosis not present

## 2020-03-08 DIAGNOSIS — Z992 Dependence on renal dialysis: Secondary | ICD-10-CM | POA: Diagnosis not present

## 2020-03-08 DIAGNOSIS — M869 Osteomyelitis, unspecified: Secondary | ICD-10-CM | POA: Diagnosis not present

## 2020-03-08 DIAGNOSIS — E1129 Type 2 diabetes mellitus with other diabetic kidney complication: Secondary | ICD-10-CM | POA: Diagnosis not present

## 2020-03-08 DIAGNOSIS — D509 Iron deficiency anemia, unspecified: Secondary | ICD-10-CM | POA: Diagnosis not present

## 2020-03-08 DIAGNOSIS — N2581 Secondary hyperparathyroidism of renal origin: Secondary | ICD-10-CM | POA: Diagnosis not present

## 2020-03-08 DIAGNOSIS — N186 End stage renal disease: Secondary | ICD-10-CM | POA: Diagnosis not present

## 2020-03-09 DIAGNOSIS — L97519 Non-pressure chronic ulcer of other part of right foot with unspecified severity: Secondary | ICD-10-CM | POA: Diagnosis not present

## 2020-03-09 DIAGNOSIS — E1140 Type 2 diabetes mellitus with diabetic neuropathy, unspecified: Secondary | ICD-10-CM | POA: Diagnosis not present

## 2020-03-09 DIAGNOSIS — M86171 Other acute osteomyelitis, right ankle and foot: Secondary | ICD-10-CM | POA: Diagnosis not present

## 2020-03-09 DIAGNOSIS — Z992 Dependence on renal dialysis: Secondary | ICD-10-CM | POA: Diagnosis not present

## 2020-03-09 DIAGNOSIS — I739 Peripheral vascular disease, unspecified: Secondary | ICD-10-CM | POA: Diagnosis not present

## 2020-03-09 DIAGNOSIS — L97512 Non-pressure chronic ulcer of other part of right foot with fat layer exposed: Secondary | ICD-10-CM | POA: Diagnosis not present

## 2020-03-10 DIAGNOSIS — Z992 Dependence on renal dialysis: Secondary | ICD-10-CM | POA: Diagnosis not present

## 2020-03-10 DIAGNOSIS — D509 Iron deficiency anemia, unspecified: Secondary | ICD-10-CM | POA: Diagnosis not present

## 2020-03-10 DIAGNOSIS — M869 Osteomyelitis, unspecified: Secondary | ICD-10-CM | POA: Diagnosis not present

## 2020-03-10 DIAGNOSIS — E1129 Type 2 diabetes mellitus with other diabetic kidney complication: Secondary | ICD-10-CM | POA: Diagnosis not present

## 2020-03-10 DIAGNOSIS — N186 End stage renal disease: Secondary | ICD-10-CM | POA: Diagnosis not present

## 2020-03-10 DIAGNOSIS — N2581 Secondary hyperparathyroidism of renal origin: Secondary | ICD-10-CM | POA: Diagnosis not present

## 2020-03-11 DIAGNOSIS — Z09 Encounter for follow-up examination after completed treatment for conditions other than malignant neoplasm: Secondary | ICD-10-CM | POA: Diagnosis not present

## 2020-03-11 DIAGNOSIS — I739 Peripheral vascular disease, unspecified: Secondary | ICD-10-CM | POA: Diagnosis not present

## 2020-03-12 DIAGNOSIS — E1129 Type 2 diabetes mellitus with other diabetic kidney complication: Secondary | ICD-10-CM | POA: Diagnosis not present

## 2020-03-12 DIAGNOSIS — N186 End stage renal disease: Secondary | ICD-10-CM | POA: Diagnosis not present

## 2020-03-12 DIAGNOSIS — Z992 Dependence on renal dialysis: Secondary | ICD-10-CM | POA: Diagnosis not present

## 2020-03-12 DIAGNOSIS — M869 Osteomyelitis, unspecified: Secondary | ICD-10-CM | POA: Diagnosis not present

## 2020-03-12 DIAGNOSIS — D509 Iron deficiency anemia, unspecified: Secondary | ICD-10-CM | POA: Diagnosis not present

## 2020-03-12 DIAGNOSIS — N2581 Secondary hyperparathyroidism of renal origin: Secondary | ICD-10-CM | POA: Diagnosis not present

## 2020-03-15 DIAGNOSIS — E1129 Type 2 diabetes mellitus with other diabetic kidney complication: Secondary | ICD-10-CM | POA: Diagnosis not present

## 2020-03-15 DIAGNOSIS — Z992 Dependence on renal dialysis: Secondary | ICD-10-CM | POA: Diagnosis not present

## 2020-03-15 DIAGNOSIS — N186 End stage renal disease: Secondary | ICD-10-CM | POA: Diagnosis not present

## 2020-03-15 DIAGNOSIS — D509 Iron deficiency anemia, unspecified: Secondary | ICD-10-CM | POA: Diagnosis not present

## 2020-03-15 DIAGNOSIS — M869 Osteomyelitis, unspecified: Secondary | ICD-10-CM | POA: Diagnosis not present

## 2020-03-15 DIAGNOSIS — N2581 Secondary hyperparathyroidism of renal origin: Secondary | ICD-10-CM | POA: Diagnosis not present

## 2020-03-17 DIAGNOSIS — N186 End stage renal disease: Secondary | ICD-10-CM | POA: Diagnosis not present

## 2020-03-17 DIAGNOSIS — N2581 Secondary hyperparathyroidism of renal origin: Secondary | ICD-10-CM | POA: Diagnosis not present

## 2020-03-17 DIAGNOSIS — D509 Iron deficiency anemia, unspecified: Secondary | ICD-10-CM | POA: Diagnosis not present

## 2020-03-17 DIAGNOSIS — M869 Osteomyelitis, unspecified: Secondary | ICD-10-CM | POA: Diagnosis not present

## 2020-03-17 DIAGNOSIS — Z992 Dependence on renal dialysis: Secondary | ICD-10-CM | POA: Diagnosis not present

## 2020-03-17 DIAGNOSIS — E1129 Type 2 diabetes mellitus with other diabetic kidney complication: Secondary | ICD-10-CM | POA: Diagnosis not present

## 2020-03-19 DIAGNOSIS — N186 End stage renal disease: Secondary | ICD-10-CM | POA: Diagnosis not present

## 2020-03-19 DIAGNOSIS — D509 Iron deficiency anemia, unspecified: Secondary | ICD-10-CM | POA: Diagnosis not present

## 2020-03-19 DIAGNOSIS — Z992 Dependence on renal dialysis: Secondary | ICD-10-CM | POA: Diagnosis not present

## 2020-03-19 DIAGNOSIS — E1129 Type 2 diabetes mellitus with other diabetic kidney complication: Secondary | ICD-10-CM | POA: Diagnosis not present

## 2020-03-19 DIAGNOSIS — N2581 Secondary hyperparathyroidism of renal origin: Secondary | ICD-10-CM | POA: Diagnosis not present

## 2020-03-19 DIAGNOSIS — M869 Osteomyelitis, unspecified: Secondary | ICD-10-CM | POA: Diagnosis not present

## 2020-03-22 DIAGNOSIS — Z992 Dependence on renal dialysis: Secondary | ICD-10-CM | POA: Diagnosis not present

## 2020-03-22 DIAGNOSIS — N2581 Secondary hyperparathyroidism of renal origin: Secondary | ICD-10-CM | POA: Diagnosis not present

## 2020-03-22 DIAGNOSIS — D509 Iron deficiency anemia, unspecified: Secondary | ICD-10-CM | POA: Diagnosis not present

## 2020-03-22 DIAGNOSIS — M869 Osteomyelitis, unspecified: Secondary | ICD-10-CM | POA: Diagnosis not present

## 2020-03-22 DIAGNOSIS — E1129 Type 2 diabetes mellitus with other diabetic kidney complication: Secondary | ICD-10-CM | POA: Diagnosis not present

## 2020-03-22 DIAGNOSIS — N186 End stage renal disease: Secondary | ICD-10-CM | POA: Diagnosis not present

## 2020-03-23 DIAGNOSIS — L97512 Non-pressure chronic ulcer of other part of right foot with fat layer exposed: Secondary | ICD-10-CM | POA: Diagnosis not present

## 2020-03-23 DIAGNOSIS — M86171 Other acute osteomyelitis, right ankle and foot: Secondary | ICD-10-CM | POA: Diagnosis not present

## 2020-03-23 DIAGNOSIS — Z992 Dependence on renal dialysis: Secondary | ICD-10-CM | POA: Diagnosis not present

## 2020-03-23 DIAGNOSIS — Z89421 Acquired absence of other right toe(s): Secondary | ICD-10-CM | POA: Diagnosis not present

## 2020-03-23 DIAGNOSIS — E1140 Type 2 diabetes mellitus with diabetic neuropathy, unspecified: Secondary | ICD-10-CM | POA: Diagnosis not present

## 2020-03-23 DIAGNOSIS — I739 Peripheral vascular disease, unspecified: Secondary | ICD-10-CM | POA: Diagnosis not present

## 2020-03-24 DIAGNOSIS — N2581 Secondary hyperparathyroidism of renal origin: Secondary | ICD-10-CM | POA: Diagnosis not present

## 2020-03-24 DIAGNOSIS — N186 End stage renal disease: Secondary | ICD-10-CM | POA: Diagnosis not present

## 2020-03-24 DIAGNOSIS — M869 Osteomyelitis, unspecified: Secondary | ICD-10-CM | POA: Diagnosis not present

## 2020-03-24 DIAGNOSIS — Z992 Dependence on renal dialysis: Secondary | ICD-10-CM | POA: Diagnosis not present

## 2020-03-24 DIAGNOSIS — E1129 Type 2 diabetes mellitus with other diabetic kidney complication: Secondary | ICD-10-CM | POA: Diagnosis not present

## 2020-03-24 DIAGNOSIS — D509 Iron deficiency anemia, unspecified: Secondary | ICD-10-CM | POA: Diagnosis not present

## 2020-03-27 DIAGNOSIS — M869 Osteomyelitis, unspecified: Secondary | ICD-10-CM | POA: Diagnosis not present

## 2020-03-27 DIAGNOSIS — D509 Iron deficiency anemia, unspecified: Secondary | ICD-10-CM | POA: Diagnosis not present

## 2020-03-27 DIAGNOSIS — Z992 Dependence on renal dialysis: Secondary | ICD-10-CM | POA: Diagnosis not present

## 2020-03-27 DIAGNOSIS — N2581 Secondary hyperparathyroidism of renal origin: Secondary | ICD-10-CM | POA: Diagnosis not present

## 2020-03-27 DIAGNOSIS — E1129 Type 2 diabetes mellitus with other diabetic kidney complication: Secondary | ICD-10-CM | POA: Diagnosis not present

## 2020-03-27 DIAGNOSIS — N186 End stage renal disease: Secondary | ICD-10-CM | POA: Diagnosis not present

## 2020-03-29 DIAGNOSIS — Z992 Dependence on renal dialysis: Secondary | ICD-10-CM | POA: Diagnosis not present

## 2020-03-29 DIAGNOSIS — D509 Iron deficiency anemia, unspecified: Secondary | ICD-10-CM | POA: Diagnosis not present

## 2020-03-29 DIAGNOSIS — M869 Osteomyelitis, unspecified: Secondary | ICD-10-CM | POA: Diagnosis not present

## 2020-03-29 DIAGNOSIS — E1129 Type 2 diabetes mellitus with other diabetic kidney complication: Secondary | ICD-10-CM | POA: Diagnosis not present

## 2020-03-29 DIAGNOSIS — N2581 Secondary hyperparathyroidism of renal origin: Secondary | ICD-10-CM | POA: Diagnosis not present

## 2020-03-29 DIAGNOSIS — N186 End stage renal disease: Secondary | ICD-10-CM | POA: Diagnosis not present

## 2020-03-31 DIAGNOSIS — E1129 Type 2 diabetes mellitus with other diabetic kidney complication: Secondary | ICD-10-CM | POA: Diagnosis not present

## 2020-03-31 DIAGNOSIS — D509 Iron deficiency anemia, unspecified: Secondary | ICD-10-CM | POA: Diagnosis not present

## 2020-03-31 DIAGNOSIS — Z992 Dependence on renal dialysis: Secondary | ICD-10-CM | POA: Diagnosis not present

## 2020-03-31 DIAGNOSIS — N186 End stage renal disease: Secondary | ICD-10-CM | POA: Diagnosis not present

## 2020-03-31 DIAGNOSIS — N2581 Secondary hyperparathyroidism of renal origin: Secondary | ICD-10-CM | POA: Diagnosis not present

## 2020-03-31 DIAGNOSIS — M869 Osteomyelitis, unspecified: Secondary | ICD-10-CM | POA: Diagnosis not present

## 2020-04-01 DIAGNOSIS — Z992 Dependence on renal dialysis: Secondary | ICD-10-CM | POA: Diagnosis not present

## 2020-04-01 DIAGNOSIS — N186 End stage renal disease: Secondary | ICD-10-CM | POA: Diagnosis not present

## 2020-04-03 DIAGNOSIS — N2581 Secondary hyperparathyroidism of renal origin: Secondary | ICD-10-CM | POA: Diagnosis not present

## 2020-04-03 DIAGNOSIS — D500 Iron deficiency anemia secondary to blood loss (chronic): Secondary | ICD-10-CM | POA: Diagnosis not present

## 2020-04-03 DIAGNOSIS — D631 Anemia in chronic kidney disease: Secondary | ICD-10-CM | POA: Diagnosis not present

## 2020-04-03 DIAGNOSIS — Z992 Dependence on renal dialysis: Secondary | ICD-10-CM | POA: Diagnosis not present

## 2020-04-03 DIAGNOSIS — D509 Iron deficiency anemia, unspecified: Secondary | ICD-10-CM | POA: Diagnosis not present

## 2020-04-03 DIAGNOSIS — N186 End stage renal disease: Secondary | ICD-10-CM | POA: Diagnosis not present

## 2020-04-05 DIAGNOSIS — D500 Iron deficiency anemia secondary to blood loss (chronic): Secondary | ICD-10-CM | POA: Diagnosis not present

## 2020-04-05 DIAGNOSIS — N186 End stage renal disease: Secondary | ICD-10-CM | POA: Diagnosis not present

## 2020-04-05 DIAGNOSIS — D509 Iron deficiency anemia, unspecified: Secondary | ICD-10-CM | POA: Diagnosis not present

## 2020-04-05 DIAGNOSIS — Z992 Dependence on renal dialysis: Secondary | ICD-10-CM | POA: Diagnosis not present

## 2020-04-05 DIAGNOSIS — N2581 Secondary hyperparathyroidism of renal origin: Secondary | ICD-10-CM | POA: Diagnosis not present

## 2020-04-05 DIAGNOSIS — E1129 Type 2 diabetes mellitus with other diabetic kidney complication: Secondary | ICD-10-CM | POA: Diagnosis not present

## 2020-04-05 DIAGNOSIS — D631 Anemia in chronic kidney disease: Secondary | ICD-10-CM | POA: Diagnosis not present

## 2020-04-07 DIAGNOSIS — Z992 Dependence on renal dialysis: Secondary | ICD-10-CM | POA: Diagnosis not present

## 2020-04-07 DIAGNOSIS — D500 Iron deficiency anemia secondary to blood loss (chronic): Secondary | ICD-10-CM | POA: Diagnosis not present

## 2020-04-07 DIAGNOSIS — N186 End stage renal disease: Secondary | ICD-10-CM | POA: Diagnosis not present

## 2020-04-07 DIAGNOSIS — D631 Anemia in chronic kidney disease: Secondary | ICD-10-CM | POA: Diagnosis not present

## 2020-04-07 DIAGNOSIS — D509 Iron deficiency anemia, unspecified: Secondary | ICD-10-CM | POA: Diagnosis not present

## 2020-04-07 DIAGNOSIS — N2581 Secondary hyperparathyroidism of renal origin: Secondary | ICD-10-CM | POA: Diagnosis not present

## 2020-04-09 DIAGNOSIS — Z992 Dependence on renal dialysis: Secondary | ICD-10-CM | POA: Diagnosis not present

## 2020-04-09 DIAGNOSIS — D500 Iron deficiency anemia secondary to blood loss (chronic): Secondary | ICD-10-CM | POA: Diagnosis not present

## 2020-04-09 DIAGNOSIS — N2581 Secondary hyperparathyroidism of renal origin: Secondary | ICD-10-CM | POA: Diagnosis not present

## 2020-04-09 DIAGNOSIS — D631 Anemia in chronic kidney disease: Secondary | ICD-10-CM | POA: Diagnosis not present

## 2020-04-09 DIAGNOSIS — D509 Iron deficiency anemia, unspecified: Secondary | ICD-10-CM | POA: Diagnosis not present

## 2020-04-09 DIAGNOSIS — N186 End stage renal disease: Secondary | ICD-10-CM | POA: Diagnosis not present

## 2020-04-11 DIAGNOSIS — L97512 Non-pressure chronic ulcer of other part of right foot with fat layer exposed: Secondary | ICD-10-CM | POA: Diagnosis not present

## 2020-04-11 DIAGNOSIS — M86171 Other acute osteomyelitis, right ankle and foot: Secondary | ICD-10-CM | POA: Diagnosis not present

## 2020-04-11 DIAGNOSIS — E1140 Type 2 diabetes mellitus with diabetic neuropathy, unspecified: Secondary | ICD-10-CM | POA: Diagnosis not present

## 2020-04-11 DIAGNOSIS — L97519 Non-pressure chronic ulcer of other part of right foot with unspecified severity: Secondary | ICD-10-CM | POA: Diagnosis not present

## 2020-04-11 DIAGNOSIS — Z89421 Acquired absence of other right toe(s): Secondary | ICD-10-CM | POA: Diagnosis not present

## 2020-04-11 DIAGNOSIS — I739 Peripheral vascular disease, unspecified: Secondary | ICD-10-CM | POA: Diagnosis not present

## 2020-04-11 DIAGNOSIS — Z992 Dependence on renal dialysis: Secondary | ICD-10-CM | POA: Diagnosis not present

## 2020-04-12 DIAGNOSIS — Z992 Dependence on renal dialysis: Secondary | ICD-10-CM | POA: Diagnosis not present

## 2020-04-12 DIAGNOSIS — N2581 Secondary hyperparathyroidism of renal origin: Secondary | ICD-10-CM | POA: Diagnosis not present

## 2020-04-12 DIAGNOSIS — D500 Iron deficiency anemia secondary to blood loss (chronic): Secondary | ICD-10-CM | POA: Diagnosis not present

## 2020-04-12 DIAGNOSIS — D631 Anemia in chronic kidney disease: Secondary | ICD-10-CM | POA: Diagnosis not present

## 2020-04-12 DIAGNOSIS — N186 End stage renal disease: Secondary | ICD-10-CM | POA: Diagnosis not present

## 2020-04-12 DIAGNOSIS — D509 Iron deficiency anemia, unspecified: Secondary | ICD-10-CM | POA: Diagnosis not present

## 2020-04-14 DIAGNOSIS — D631 Anemia in chronic kidney disease: Secondary | ICD-10-CM | POA: Diagnosis not present

## 2020-04-14 DIAGNOSIS — N186 End stage renal disease: Secondary | ICD-10-CM | POA: Diagnosis not present

## 2020-04-14 DIAGNOSIS — Z992 Dependence on renal dialysis: Secondary | ICD-10-CM | POA: Diagnosis not present

## 2020-04-14 DIAGNOSIS — N2581 Secondary hyperparathyroidism of renal origin: Secondary | ICD-10-CM | POA: Diagnosis not present

## 2020-04-14 DIAGNOSIS — D509 Iron deficiency anemia, unspecified: Secondary | ICD-10-CM | POA: Diagnosis not present

## 2020-04-14 DIAGNOSIS — D500 Iron deficiency anemia secondary to blood loss (chronic): Secondary | ICD-10-CM | POA: Diagnosis not present

## 2020-04-16 DIAGNOSIS — D631 Anemia in chronic kidney disease: Secondary | ICD-10-CM | POA: Diagnosis not present

## 2020-04-16 DIAGNOSIS — Z992 Dependence on renal dialysis: Secondary | ICD-10-CM | POA: Diagnosis not present

## 2020-04-16 DIAGNOSIS — D509 Iron deficiency anemia, unspecified: Secondary | ICD-10-CM | POA: Diagnosis not present

## 2020-04-16 DIAGNOSIS — N186 End stage renal disease: Secondary | ICD-10-CM | POA: Diagnosis not present

## 2020-04-16 DIAGNOSIS — N2581 Secondary hyperparathyroidism of renal origin: Secondary | ICD-10-CM | POA: Diagnosis not present

## 2020-04-16 DIAGNOSIS — D500 Iron deficiency anemia secondary to blood loss (chronic): Secondary | ICD-10-CM | POA: Diagnosis not present

## 2020-04-20 DIAGNOSIS — N186 End stage renal disease: Secondary | ICD-10-CM | POA: Diagnosis not present

## 2020-04-21 DIAGNOSIS — D509 Iron deficiency anemia, unspecified: Secondary | ICD-10-CM | POA: Diagnosis not present

## 2020-04-21 DIAGNOSIS — D500 Iron deficiency anemia secondary to blood loss (chronic): Secondary | ICD-10-CM | POA: Diagnosis not present

## 2020-04-21 DIAGNOSIS — N2581 Secondary hyperparathyroidism of renal origin: Secondary | ICD-10-CM | POA: Diagnosis not present

## 2020-04-21 DIAGNOSIS — D631 Anemia in chronic kidney disease: Secondary | ICD-10-CM | POA: Diagnosis not present

## 2020-04-21 DIAGNOSIS — N186 End stage renal disease: Secondary | ICD-10-CM | POA: Diagnosis not present

## 2020-04-21 DIAGNOSIS — Z992 Dependence on renal dialysis: Secondary | ICD-10-CM | POA: Diagnosis not present

## 2020-04-22 DIAGNOSIS — Z789 Other specified health status: Secondary | ICD-10-CM | POA: Diagnosis not present

## 2020-04-22 DIAGNOSIS — Z6824 Body mass index (BMI) 24.0-24.9, adult: Secondary | ICD-10-CM | POA: Diagnosis not present

## 2020-04-22 DIAGNOSIS — Z299 Encounter for prophylactic measures, unspecified: Secondary | ICD-10-CM | POA: Diagnosis not present

## 2020-04-22 DIAGNOSIS — I10 Essential (primary) hypertension: Secondary | ICD-10-CM | POA: Diagnosis not present

## 2020-04-22 DIAGNOSIS — E11621 Type 2 diabetes mellitus with foot ulcer: Secondary | ICD-10-CM | POA: Diagnosis not present

## 2020-04-22 DIAGNOSIS — L97509 Non-pressure chronic ulcer of other part of unspecified foot with unspecified severity: Secondary | ICD-10-CM | POA: Diagnosis not present

## 2020-04-22 DIAGNOSIS — E1165 Type 2 diabetes mellitus with hyperglycemia: Secondary | ICD-10-CM | POA: Diagnosis not present

## 2020-04-22 DIAGNOSIS — Z992 Dependence on renal dialysis: Secondary | ICD-10-CM | POA: Diagnosis not present

## 2020-04-23 DIAGNOSIS — N2581 Secondary hyperparathyroidism of renal origin: Secondary | ICD-10-CM | POA: Diagnosis not present

## 2020-04-23 DIAGNOSIS — D509 Iron deficiency anemia, unspecified: Secondary | ICD-10-CM | POA: Diagnosis not present

## 2020-04-23 DIAGNOSIS — D631 Anemia in chronic kidney disease: Secondary | ICD-10-CM | POA: Diagnosis not present

## 2020-04-23 DIAGNOSIS — Z992 Dependence on renal dialysis: Secondary | ICD-10-CM | POA: Diagnosis not present

## 2020-04-23 DIAGNOSIS — N186 End stage renal disease: Secondary | ICD-10-CM | POA: Diagnosis not present

## 2020-04-23 DIAGNOSIS — D500 Iron deficiency anemia secondary to blood loss (chronic): Secondary | ICD-10-CM | POA: Diagnosis not present

## 2020-04-25 DIAGNOSIS — M86171 Other acute osteomyelitis, right ankle and foot: Secondary | ICD-10-CM | POA: Diagnosis not present

## 2020-04-25 DIAGNOSIS — L97512 Non-pressure chronic ulcer of other part of right foot with fat layer exposed: Secondary | ICD-10-CM | POA: Diagnosis not present

## 2020-04-25 DIAGNOSIS — Z89421 Acquired absence of other right toe(s): Secondary | ICD-10-CM | POA: Diagnosis not present

## 2020-04-25 DIAGNOSIS — Z992 Dependence on renal dialysis: Secondary | ICD-10-CM | POA: Diagnosis not present

## 2020-04-25 DIAGNOSIS — I739 Peripheral vascular disease, unspecified: Secondary | ICD-10-CM | POA: Diagnosis not present

## 2020-04-25 DIAGNOSIS — E1140 Type 2 diabetes mellitus with diabetic neuropathy, unspecified: Secondary | ICD-10-CM | POA: Diagnosis not present

## 2020-04-26 DIAGNOSIS — D509 Iron deficiency anemia, unspecified: Secondary | ICD-10-CM | POA: Diagnosis not present

## 2020-04-26 DIAGNOSIS — D631 Anemia in chronic kidney disease: Secondary | ICD-10-CM | POA: Diagnosis not present

## 2020-04-26 DIAGNOSIS — N186 End stage renal disease: Secondary | ICD-10-CM | POA: Diagnosis not present

## 2020-04-26 DIAGNOSIS — Z992 Dependence on renal dialysis: Secondary | ICD-10-CM | POA: Diagnosis not present

## 2020-04-26 DIAGNOSIS — N2581 Secondary hyperparathyroidism of renal origin: Secondary | ICD-10-CM | POA: Diagnosis not present

## 2020-04-26 DIAGNOSIS — D500 Iron deficiency anemia secondary to blood loss (chronic): Secondary | ICD-10-CM | POA: Diagnosis not present

## 2020-04-28 DIAGNOSIS — N186 End stage renal disease: Secondary | ICD-10-CM | POA: Diagnosis not present

## 2020-04-28 DIAGNOSIS — Z992 Dependence on renal dialysis: Secondary | ICD-10-CM | POA: Diagnosis not present

## 2020-04-28 DIAGNOSIS — D509 Iron deficiency anemia, unspecified: Secondary | ICD-10-CM | POA: Diagnosis not present

## 2020-04-28 DIAGNOSIS — D500 Iron deficiency anemia secondary to blood loss (chronic): Secondary | ICD-10-CM | POA: Diagnosis not present

## 2020-04-28 DIAGNOSIS — N2581 Secondary hyperparathyroidism of renal origin: Secondary | ICD-10-CM | POA: Diagnosis not present

## 2020-04-28 DIAGNOSIS — D631 Anemia in chronic kidney disease: Secondary | ICD-10-CM | POA: Diagnosis not present

## 2020-04-30 DIAGNOSIS — Z992 Dependence on renal dialysis: Secondary | ICD-10-CM | POA: Diagnosis not present

## 2020-04-30 DIAGNOSIS — N186 End stage renal disease: Secondary | ICD-10-CM | POA: Diagnosis not present

## 2020-04-30 DIAGNOSIS — D500 Iron deficiency anemia secondary to blood loss (chronic): Secondary | ICD-10-CM | POA: Diagnosis not present

## 2020-04-30 DIAGNOSIS — D631 Anemia in chronic kidney disease: Secondary | ICD-10-CM | POA: Diagnosis not present

## 2020-04-30 DIAGNOSIS — D509 Iron deficiency anemia, unspecified: Secondary | ICD-10-CM | POA: Diagnosis not present

## 2020-04-30 DIAGNOSIS — N2581 Secondary hyperparathyroidism of renal origin: Secondary | ICD-10-CM | POA: Diagnosis not present

## 2020-05-02 DIAGNOSIS — Z992 Dependence on renal dialysis: Secondary | ICD-10-CM | POA: Diagnosis not present

## 2020-05-02 DIAGNOSIS — N186 End stage renal disease: Secondary | ICD-10-CM | POA: Diagnosis not present

## 2020-05-03 DIAGNOSIS — D631 Anemia in chronic kidney disease: Secondary | ICD-10-CM | POA: Diagnosis not present

## 2020-05-03 DIAGNOSIS — Z992 Dependence on renal dialysis: Secondary | ICD-10-CM | POA: Diagnosis not present

## 2020-05-03 DIAGNOSIS — N186 End stage renal disease: Secondary | ICD-10-CM | POA: Diagnosis not present

## 2020-05-03 DIAGNOSIS — N2581 Secondary hyperparathyroidism of renal origin: Secondary | ICD-10-CM | POA: Diagnosis not present

## 2020-05-03 DIAGNOSIS — D509 Iron deficiency anemia, unspecified: Secondary | ICD-10-CM | POA: Diagnosis not present

## 2020-05-03 DIAGNOSIS — D500 Iron deficiency anemia secondary to blood loss (chronic): Secondary | ICD-10-CM | POA: Diagnosis not present

## 2020-05-05 DIAGNOSIS — D500 Iron deficiency anemia secondary to blood loss (chronic): Secondary | ICD-10-CM | POA: Diagnosis not present

## 2020-05-05 DIAGNOSIS — Z992 Dependence on renal dialysis: Secondary | ICD-10-CM | POA: Diagnosis not present

## 2020-05-05 DIAGNOSIS — N2581 Secondary hyperparathyroidism of renal origin: Secondary | ICD-10-CM | POA: Diagnosis not present

## 2020-05-05 DIAGNOSIS — D631 Anemia in chronic kidney disease: Secondary | ICD-10-CM | POA: Diagnosis not present

## 2020-05-05 DIAGNOSIS — N186 End stage renal disease: Secondary | ICD-10-CM | POA: Diagnosis not present

## 2020-05-05 DIAGNOSIS — D509 Iron deficiency anemia, unspecified: Secondary | ICD-10-CM | POA: Diagnosis not present

## 2020-05-07 DIAGNOSIS — D631 Anemia in chronic kidney disease: Secondary | ICD-10-CM | POA: Diagnosis not present

## 2020-05-07 DIAGNOSIS — D500 Iron deficiency anemia secondary to blood loss (chronic): Secondary | ICD-10-CM | POA: Diagnosis not present

## 2020-05-07 DIAGNOSIS — N186 End stage renal disease: Secondary | ICD-10-CM | POA: Diagnosis not present

## 2020-05-07 DIAGNOSIS — Z992 Dependence on renal dialysis: Secondary | ICD-10-CM | POA: Diagnosis not present

## 2020-05-07 DIAGNOSIS — N2581 Secondary hyperparathyroidism of renal origin: Secondary | ICD-10-CM | POA: Diagnosis not present

## 2020-05-07 DIAGNOSIS — D509 Iron deficiency anemia, unspecified: Secondary | ICD-10-CM | POA: Diagnosis not present

## 2020-05-10 DIAGNOSIS — D631 Anemia in chronic kidney disease: Secondary | ICD-10-CM | POA: Diagnosis not present

## 2020-05-10 DIAGNOSIS — N2581 Secondary hyperparathyroidism of renal origin: Secondary | ICD-10-CM | POA: Diagnosis not present

## 2020-05-10 DIAGNOSIS — D500 Iron deficiency anemia secondary to blood loss (chronic): Secondary | ICD-10-CM | POA: Diagnosis not present

## 2020-05-10 DIAGNOSIS — N186 End stage renal disease: Secondary | ICD-10-CM | POA: Diagnosis not present

## 2020-05-10 DIAGNOSIS — D509 Iron deficiency anemia, unspecified: Secondary | ICD-10-CM | POA: Diagnosis not present

## 2020-05-10 DIAGNOSIS — Z992 Dependence on renal dialysis: Secondary | ICD-10-CM | POA: Diagnosis not present

## 2020-05-12 DIAGNOSIS — D500 Iron deficiency anemia secondary to blood loss (chronic): Secondary | ICD-10-CM | POA: Diagnosis not present

## 2020-05-12 DIAGNOSIS — N2581 Secondary hyperparathyroidism of renal origin: Secondary | ICD-10-CM | POA: Diagnosis not present

## 2020-05-12 DIAGNOSIS — N186 End stage renal disease: Secondary | ICD-10-CM | POA: Diagnosis not present

## 2020-05-12 DIAGNOSIS — Z992 Dependence on renal dialysis: Secondary | ICD-10-CM | POA: Diagnosis not present

## 2020-05-12 DIAGNOSIS — D509 Iron deficiency anemia, unspecified: Secondary | ICD-10-CM | POA: Diagnosis not present

## 2020-05-12 DIAGNOSIS — D631 Anemia in chronic kidney disease: Secondary | ICD-10-CM | POA: Diagnosis not present

## 2020-05-14 DIAGNOSIS — D631 Anemia in chronic kidney disease: Secondary | ICD-10-CM | POA: Diagnosis not present

## 2020-05-14 DIAGNOSIS — D509 Iron deficiency anemia, unspecified: Secondary | ICD-10-CM | POA: Diagnosis not present

## 2020-05-14 DIAGNOSIS — N2581 Secondary hyperparathyroidism of renal origin: Secondary | ICD-10-CM | POA: Diagnosis not present

## 2020-05-14 DIAGNOSIS — D500 Iron deficiency anemia secondary to blood loss (chronic): Secondary | ICD-10-CM | POA: Diagnosis not present

## 2020-05-14 DIAGNOSIS — N186 End stage renal disease: Secondary | ICD-10-CM | POA: Diagnosis not present

## 2020-05-14 DIAGNOSIS — Z992 Dependence on renal dialysis: Secondary | ICD-10-CM | POA: Diagnosis not present

## 2020-05-17 DIAGNOSIS — D509 Iron deficiency anemia, unspecified: Secondary | ICD-10-CM | POA: Diagnosis not present

## 2020-05-17 DIAGNOSIS — D500 Iron deficiency anemia secondary to blood loss (chronic): Secondary | ICD-10-CM | POA: Diagnosis not present

## 2020-05-17 DIAGNOSIS — D631 Anemia in chronic kidney disease: Secondary | ICD-10-CM | POA: Diagnosis not present

## 2020-05-17 DIAGNOSIS — N2581 Secondary hyperparathyroidism of renal origin: Secondary | ICD-10-CM | POA: Diagnosis not present

## 2020-05-17 DIAGNOSIS — Z992 Dependence on renal dialysis: Secondary | ICD-10-CM | POA: Diagnosis not present

## 2020-05-17 DIAGNOSIS — N186 End stage renal disease: Secondary | ICD-10-CM | POA: Diagnosis not present

## 2020-05-18 DIAGNOSIS — L97512 Non-pressure chronic ulcer of other part of right foot with fat layer exposed: Secondary | ICD-10-CM | POA: Diagnosis not present

## 2020-05-18 DIAGNOSIS — L089 Local infection of the skin and subcutaneous tissue, unspecified: Secondary | ICD-10-CM | POA: Diagnosis not present

## 2020-05-18 DIAGNOSIS — E1140 Type 2 diabetes mellitus with diabetic neuropathy, unspecified: Secondary | ICD-10-CM | POA: Diagnosis not present

## 2020-05-18 DIAGNOSIS — M86171 Other acute osteomyelitis, right ankle and foot: Secondary | ICD-10-CM | POA: Diagnosis not present

## 2020-05-18 DIAGNOSIS — Z992 Dependence on renal dialysis: Secondary | ICD-10-CM | POA: Diagnosis not present

## 2020-05-18 DIAGNOSIS — Z89421 Acquired absence of other right toe(s): Secondary | ICD-10-CM | POA: Diagnosis not present

## 2020-05-19 DIAGNOSIS — N186 End stage renal disease: Secondary | ICD-10-CM | POA: Diagnosis not present

## 2020-05-19 DIAGNOSIS — N2581 Secondary hyperparathyroidism of renal origin: Secondary | ICD-10-CM | POA: Diagnosis not present

## 2020-05-19 DIAGNOSIS — D500 Iron deficiency anemia secondary to blood loss (chronic): Secondary | ICD-10-CM | POA: Diagnosis not present

## 2020-05-19 DIAGNOSIS — D509 Iron deficiency anemia, unspecified: Secondary | ICD-10-CM | POA: Diagnosis not present

## 2020-05-19 DIAGNOSIS — Z992 Dependence on renal dialysis: Secondary | ICD-10-CM | POA: Diagnosis not present

## 2020-05-19 DIAGNOSIS — D631 Anemia in chronic kidney disease: Secondary | ICD-10-CM | POA: Diagnosis not present

## 2020-05-21 DIAGNOSIS — Z992 Dependence on renal dialysis: Secondary | ICD-10-CM | POA: Diagnosis not present

## 2020-05-21 DIAGNOSIS — N186 End stage renal disease: Secondary | ICD-10-CM | POA: Diagnosis not present

## 2020-05-21 DIAGNOSIS — N2581 Secondary hyperparathyroidism of renal origin: Secondary | ICD-10-CM | POA: Diagnosis not present

## 2020-05-21 DIAGNOSIS — D631 Anemia in chronic kidney disease: Secondary | ICD-10-CM | POA: Diagnosis not present

## 2020-05-21 DIAGNOSIS — D500 Iron deficiency anemia secondary to blood loss (chronic): Secondary | ICD-10-CM | POA: Diagnosis not present

## 2020-05-21 DIAGNOSIS — D509 Iron deficiency anemia, unspecified: Secondary | ICD-10-CM | POA: Diagnosis not present

## 2020-05-23 DIAGNOSIS — D509 Iron deficiency anemia, unspecified: Secondary | ICD-10-CM | POA: Diagnosis not present

## 2020-05-23 DIAGNOSIS — N2581 Secondary hyperparathyroidism of renal origin: Secondary | ICD-10-CM | POA: Diagnosis not present

## 2020-05-23 DIAGNOSIS — D631 Anemia in chronic kidney disease: Secondary | ICD-10-CM | POA: Diagnosis not present

## 2020-05-23 DIAGNOSIS — Z992 Dependence on renal dialysis: Secondary | ICD-10-CM | POA: Diagnosis not present

## 2020-05-23 DIAGNOSIS — N186 End stage renal disease: Secondary | ICD-10-CM | POA: Diagnosis not present

## 2020-05-23 DIAGNOSIS — D500 Iron deficiency anemia secondary to blood loss (chronic): Secondary | ICD-10-CM | POA: Diagnosis not present

## 2020-05-25 DIAGNOSIS — N2581 Secondary hyperparathyroidism of renal origin: Secondary | ICD-10-CM | POA: Diagnosis not present

## 2020-05-25 DIAGNOSIS — D509 Iron deficiency anemia, unspecified: Secondary | ICD-10-CM | POA: Diagnosis not present

## 2020-05-25 DIAGNOSIS — Z992 Dependence on renal dialysis: Secondary | ICD-10-CM | POA: Diagnosis not present

## 2020-05-25 DIAGNOSIS — N186 End stage renal disease: Secondary | ICD-10-CM | POA: Diagnosis not present

## 2020-05-25 DIAGNOSIS — D631 Anemia in chronic kidney disease: Secondary | ICD-10-CM | POA: Diagnosis not present

## 2020-05-25 DIAGNOSIS — D500 Iron deficiency anemia secondary to blood loss (chronic): Secondary | ICD-10-CM | POA: Diagnosis not present

## 2020-05-27 DIAGNOSIS — N2581 Secondary hyperparathyroidism of renal origin: Secondary | ICD-10-CM | POA: Diagnosis not present

## 2020-05-27 DIAGNOSIS — Z992 Dependence on renal dialysis: Secondary | ICD-10-CM | POA: Diagnosis not present

## 2020-05-27 DIAGNOSIS — D509 Iron deficiency anemia, unspecified: Secondary | ICD-10-CM | POA: Diagnosis not present

## 2020-05-27 DIAGNOSIS — N186 End stage renal disease: Secondary | ICD-10-CM | POA: Diagnosis not present

## 2020-05-27 DIAGNOSIS — D500 Iron deficiency anemia secondary to blood loss (chronic): Secondary | ICD-10-CM | POA: Diagnosis not present

## 2020-05-27 DIAGNOSIS — D631 Anemia in chronic kidney disease: Secondary | ICD-10-CM | POA: Diagnosis not present

## 2020-05-30 DIAGNOSIS — N2581 Secondary hyperparathyroidism of renal origin: Secondary | ICD-10-CM | POA: Diagnosis not present

## 2020-05-30 DIAGNOSIS — N186 End stage renal disease: Secondary | ICD-10-CM | POA: Diagnosis not present

## 2020-05-30 DIAGNOSIS — D631 Anemia in chronic kidney disease: Secondary | ICD-10-CM | POA: Diagnosis not present

## 2020-05-30 DIAGNOSIS — Z992 Dependence on renal dialysis: Secondary | ICD-10-CM | POA: Diagnosis not present

## 2020-05-30 DIAGNOSIS — D509 Iron deficiency anemia, unspecified: Secondary | ICD-10-CM | POA: Diagnosis not present

## 2020-05-30 DIAGNOSIS — D500 Iron deficiency anemia secondary to blood loss (chronic): Secondary | ICD-10-CM | POA: Diagnosis not present

## 2020-06-01 DIAGNOSIS — D631 Anemia in chronic kidney disease: Secondary | ICD-10-CM | POA: Diagnosis not present

## 2020-06-01 DIAGNOSIS — Z992 Dependence on renal dialysis: Secondary | ICD-10-CM | POA: Diagnosis not present

## 2020-06-01 DIAGNOSIS — N2581 Secondary hyperparathyroidism of renal origin: Secondary | ICD-10-CM | POA: Diagnosis not present

## 2020-06-01 DIAGNOSIS — D509 Iron deficiency anemia, unspecified: Secondary | ICD-10-CM | POA: Diagnosis not present

## 2020-06-01 DIAGNOSIS — D500 Iron deficiency anemia secondary to blood loss (chronic): Secondary | ICD-10-CM | POA: Diagnosis not present

## 2020-06-01 DIAGNOSIS — N186 End stage renal disease: Secondary | ICD-10-CM | POA: Diagnosis not present

## 2020-06-03 DIAGNOSIS — D500 Iron deficiency anemia secondary to blood loss (chronic): Secondary | ICD-10-CM | POA: Diagnosis not present

## 2020-06-03 DIAGNOSIS — N186 End stage renal disease: Secondary | ICD-10-CM | POA: Diagnosis not present

## 2020-06-03 DIAGNOSIS — D509 Iron deficiency anemia, unspecified: Secondary | ICD-10-CM | POA: Diagnosis not present

## 2020-06-03 DIAGNOSIS — Z992 Dependence on renal dialysis: Secondary | ICD-10-CM | POA: Diagnosis not present

## 2020-06-03 DIAGNOSIS — N2581 Secondary hyperparathyroidism of renal origin: Secondary | ICD-10-CM | POA: Diagnosis not present

## 2020-06-03 DIAGNOSIS — D631 Anemia in chronic kidney disease: Secondary | ICD-10-CM | POA: Diagnosis not present

## 2020-06-06 DIAGNOSIS — N2581 Secondary hyperparathyroidism of renal origin: Secondary | ICD-10-CM | POA: Diagnosis not present

## 2020-06-06 DIAGNOSIS — D500 Iron deficiency anemia secondary to blood loss (chronic): Secondary | ICD-10-CM | POA: Diagnosis not present

## 2020-06-06 DIAGNOSIS — N186 End stage renal disease: Secondary | ICD-10-CM | POA: Diagnosis not present

## 2020-06-06 DIAGNOSIS — Z992 Dependence on renal dialysis: Secondary | ICD-10-CM | POA: Diagnosis not present

## 2020-06-06 DIAGNOSIS — D631 Anemia in chronic kidney disease: Secondary | ICD-10-CM | POA: Diagnosis not present

## 2020-06-06 DIAGNOSIS — D509 Iron deficiency anemia, unspecified: Secondary | ICD-10-CM | POA: Diagnosis not present

## 2020-06-08 DIAGNOSIS — D631 Anemia in chronic kidney disease: Secondary | ICD-10-CM | POA: Diagnosis not present

## 2020-06-08 DIAGNOSIS — N186 End stage renal disease: Secondary | ICD-10-CM | POA: Diagnosis not present

## 2020-06-08 DIAGNOSIS — D509 Iron deficiency anemia, unspecified: Secondary | ICD-10-CM | POA: Diagnosis not present

## 2020-06-08 DIAGNOSIS — N2581 Secondary hyperparathyroidism of renal origin: Secondary | ICD-10-CM | POA: Diagnosis not present

## 2020-06-08 DIAGNOSIS — D500 Iron deficiency anemia secondary to blood loss (chronic): Secondary | ICD-10-CM | POA: Diagnosis not present

## 2020-06-08 DIAGNOSIS — Z992 Dependence on renal dialysis: Secondary | ICD-10-CM | POA: Diagnosis not present

## 2020-06-09 DIAGNOSIS — L97512 Non-pressure chronic ulcer of other part of right foot with fat layer exposed: Secondary | ICD-10-CM | POA: Diagnosis not present

## 2020-06-09 DIAGNOSIS — Z992 Dependence on renal dialysis: Secondary | ICD-10-CM | POA: Diagnosis not present

## 2020-06-09 DIAGNOSIS — E1140 Type 2 diabetes mellitus with diabetic neuropathy, unspecified: Secondary | ICD-10-CM | POA: Diagnosis not present

## 2020-06-09 DIAGNOSIS — I739 Peripheral vascular disease, unspecified: Secondary | ICD-10-CM | POA: Diagnosis not present

## 2020-06-10 DIAGNOSIS — Z992 Dependence on renal dialysis: Secondary | ICD-10-CM | POA: Diagnosis not present

## 2020-06-10 DIAGNOSIS — D509 Iron deficiency anemia, unspecified: Secondary | ICD-10-CM | POA: Diagnosis not present

## 2020-06-10 DIAGNOSIS — D631 Anemia in chronic kidney disease: Secondary | ICD-10-CM | POA: Diagnosis not present

## 2020-06-10 DIAGNOSIS — N2581 Secondary hyperparathyroidism of renal origin: Secondary | ICD-10-CM | POA: Diagnosis not present

## 2020-06-10 DIAGNOSIS — D500 Iron deficiency anemia secondary to blood loss (chronic): Secondary | ICD-10-CM | POA: Diagnosis not present

## 2020-06-10 DIAGNOSIS — N186 End stage renal disease: Secondary | ICD-10-CM | POA: Diagnosis not present

## 2020-06-13 DIAGNOSIS — N2581 Secondary hyperparathyroidism of renal origin: Secondary | ICD-10-CM | POA: Diagnosis not present

## 2020-06-13 DIAGNOSIS — D500 Iron deficiency anemia secondary to blood loss (chronic): Secondary | ICD-10-CM | POA: Diagnosis not present

## 2020-06-13 DIAGNOSIS — Z992 Dependence on renal dialysis: Secondary | ICD-10-CM | POA: Diagnosis not present

## 2020-06-13 DIAGNOSIS — N186 End stage renal disease: Secondary | ICD-10-CM | POA: Diagnosis not present

## 2020-06-13 DIAGNOSIS — D509 Iron deficiency anemia, unspecified: Secondary | ICD-10-CM | POA: Diagnosis not present

## 2020-06-13 DIAGNOSIS — D631 Anemia in chronic kidney disease: Secondary | ICD-10-CM | POA: Diagnosis not present

## 2020-06-15 DIAGNOSIS — D631 Anemia in chronic kidney disease: Secondary | ICD-10-CM | POA: Diagnosis not present

## 2020-06-15 DIAGNOSIS — D500 Iron deficiency anemia secondary to blood loss (chronic): Secondary | ICD-10-CM | POA: Diagnosis not present

## 2020-06-15 DIAGNOSIS — N186 End stage renal disease: Secondary | ICD-10-CM | POA: Diagnosis not present

## 2020-06-15 DIAGNOSIS — N2581 Secondary hyperparathyroidism of renal origin: Secondary | ICD-10-CM | POA: Diagnosis not present

## 2020-06-15 DIAGNOSIS — D509 Iron deficiency anemia, unspecified: Secondary | ICD-10-CM | POA: Diagnosis not present

## 2020-06-15 DIAGNOSIS — Z992 Dependence on renal dialysis: Secondary | ICD-10-CM | POA: Diagnosis not present

## 2020-06-17 DIAGNOSIS — N186 End stage renal disease: Secondary | ICD-10-CM | POA: Diagnosis not present

## 2020-06-17 DIAGNOSIS — Z992 Dependence on renal dialysis: Secondary | ICD-10-CM | POA: Diagnosis not present

## 2020-06-17 DIAGNOSIS — D500 Iron deficiency anemia secondary to blood loss (chronic): Secondary | ICD-10-CM | POA: Diagnosis not present

## 2020-06-17 DIAGNOSIS — D631 Anemia in chronic kidney disease: Secondary | ICD-10-CM | POA: Diagnosis not present

## 2020-06-17 DIAGNOSIS — D509 Iron deficiency anemia, unspecified: Secondary | ICD-10-CM | POA: Diagnosis not present

## 2020-06-17 DIAGNOSIS — N2581 Secondary hyperparathyroidism of renal origin: Secondary | ICD-10-CM | POA: Diagnosis not present

## 2020-06-20 DIAGNOSIS — D631 Anemia in chronic kidney disease: Secondary | ICD-10-CM | POA: Diagnosis not present

## 2020-06-20 DIAGNOSIS — N2581 Secondary hyperparathyroidism of renal origin: Secondary | ICD-10-CM | POA: Diagnosis not present

## 2020-06-20 DIAGNOSIS — Z992 Dependence on renal dialysis: Secondary | ICD-10-CM | POA: Diagnosis not present

## 2020-06-20 DIAGNOSIS — N186 End stage renal disease: Secondary | ICD-10-CM | POA: Diagnosis not present

## 2020-06-20 DIAGNOSIS — D509 Iron deficiency anemia, unspecified: Secondary | ICD-10-CM | POA: Diagnosis not present

## 2020-06-20 DIAGNOSIS — D500 Iron deficiency anemia secondary to blood loss (chronic): Secondary | ICD-10-CM | POA: Diagnosis not present

## 2020-06-22 DIAGNOSIS — N186 End stage renal disease: Secondary | ICD-10-CM | POA: Diagnosis not present

## 2020-06-22 DIAGNOSIS — D500 Iron deficiency anemia secondary to blood loss (chronic): Secondary | ICD-10-CM | POA: Diagnosis not present

## 2020-06-22 DIAGNOSIS — D509 Iron deficiency anemia, unspecified: Secondary | ICD-10-CM | POA: Diagnosis not present

## 2020-06-22 DIAGNOSIS — D631 Anemia in chronic kidney disease: Secondary | ICD-10-CM | POA: Diagnosis not present

## 2020-06-22 DIAGNOSIS — Z992 Dependence on renal dialysis: Secondary | ICD-10-CM | POA: Diagnosis not present

## 2020-06-22 DIAGNOSIS — N2581 Secondary hyperparathyroidism of renal origin: Secondary | ICD-10-CM | POA: Diagnosis not present

## 2020-06-24 DIAGNOSIS — Z992 Dependence on renal dialysis: Secondary | ICD-10-CM | POA: Diagnosis not present

## 2020-06-24 DIAGNOSIS — D509 Iron deficiency anemia, unspecified: Secondary | ICD-10-CM | POA: Diagnosis not present

## 2020-06-24 DIAGNOSIS — N2581 Secondary hyperparathyroidism of renal origin: Secondary | ICD-10-CM | POA: Diagnosis not present

## 2020-06-24 DIAGNOSIS — D631 Anemia in chronic kidney disease: Secondary | ICD-10-CM | POA: Diagnosis not present

## 2020-06-24 DIAGNOSIS — D500 Iron deficiency anemia secondary to blood loss (chronic): Secondary | ICD-10-CM | POA: Diagnosis not present

## 2020-06-24 DIAGNOSIS — N186 End stage renal disease: Secondary | ICD-10-CM | POA: Diagnosis not present

## 2020-06-27 DIAGNOSIS — D500 Iron deficiency anemia secondary to blood loss (chronic): Secondary | ICD-10-CM | POA: Diagnosis not present

## 2020-06-27 DIAGNOSIS — Z992 Dependence on renal dialysis: Secondary | ICD-10-CM | POA: Diagnosis not present

## 2020-06-27 DIAGNOSIS — D509 Iron deficiency anemia, unspecified: Secondary | ICD-10-CM | POA: Diagnosis not present

## 2020-06-27 DIAGNOSIS — N186 End stage renal disease: Secondary | ICD-10-CM | POA: Diagnosis not present

## 2020-06-27 DIAGNOSIS — D631 Anemia in chronic kidney disease: Secondary | ICD-10-CM | POA: Diagnosis not present

## 2020-06-27 DIAGNOSIS — N2581 Secondary hyperparathyroidism of renal origin: Secondary | ICD-10-CM | POA: Diagnosis not present

## 2020-06-29 DIAGNOSIS — D631 Anemia in chronic kidney disease: Secondary | ICD-10-CM | POA: Diagnosis not present

## 2020-06-29 DIAGNOSIS — D500 Iron deficiency anemia secondary to blood loss (chronic): Secondary | ICD-10-CM | POA: Diagnosis not present

## 2020-06-29 DIAGNOSIS — N2581 Secondary hyperparathyroidism of renal origin: Secondary | ICD-10-CM | POA: Diagnosis not present

## 2020-06-29 DIAGNOSIS — Z992 Dependence on renal dialysis: Secondary | ICD-10-CM | POA: Diagnosis not present

## 2020-06-29 DIAGNOSIS — N186 End stage renal disease: Secondary | ICD-10-CM | POA: Diagnosis not present

## 2020-06-29 DIAGNOSIS — D509 Iron deficiency anemia, unspecified: Secondary | ICD-10-CM | POA: Diagnosis not present

## 2020-06-30 DIAGNOSIS — E1140 Type 2 diabetes mellitus with diabetic neuropathy, unspecified: Secondary | ICD-10-CM | POA: Diagnosis not present

## 2020-06-30 DIAGNOSIS — Z992 Dependence on renal dialysis: Secondary | ICD-10-CM | POA: Diagnosis not present

## 2020-06-30 DIAGNOSIS — N186 End stage renal disease: Secondary | ICD-10-CM | POA: Diagnosis not present

## 2020-06-30 DIAGNOSIS — M86171 Other acute osteomyelitis, right ankle and foot: Secondary | ICD-10-CM | POA: Diagnosis not present

## 2020-06-30 DIAGNOSIS — L02611 Cutaneous abscess of right foot: Secondary | ICD-10-CM | POA: Diagnosis not present

## 2020-06-30 DIAGNOSIS — L97512 Non-pressure chronic ulcer of other part of right foot with fat layer exposed: Secondary | ICD-10-CM | POA: Diagnosis not present

## 2020-07-01 DIAGNOSIS — D509 Iron deficiency anemia, unspecified: Secondary | ICD-10-CM | POA: Diagnosis not present

## 2020-07-01 DIAGNOSIS — N186 End stage renal disease: Secondary | ICD-10-CM | POA: Diagnosis not present

## 2020-07-01 DIAGNOSIS — Z992 Dependence on renal dialysis: Secondary | ICD-10-CM | POA: Diagnosis not present

## 2020-07-01 DIAGNOSIS — N2581 Secondary hyperparathyroidism of renal origin: Secondary | ICD-10-CM | POA: Diagnosis not present

## 2020-07-01 DIAGNOSIS — D631 Anemia in chronic kidney disease: Secondary | ICD-10-CM | POA: Diagnosis not present

## 2020-07-01 DIAGNOSIS — D500 Iron deficiency anemia secondary to blood loss (chronic): Secondary | ICD-10-CM | POA: Diagnosis not present

## 2020-07-04 DIAGNOSIS — D509 Iron deficiency anemia, unspecified: Secondary | ICD-10-CM | POA: Diagnosis not present

## 2020-07-04 DIAGNOSIS — D631 Anemia in chronic kidney disease: Secondary | ICD-10-CM | POA: Diagnosis not present

## 2020-07-04 DIAGNOSIS — E1129 Type 2 diabetes mellitus with other diabetic kidney complication: Secondary | ICD-10-CM | POA: Diagnosis not present

## 2020-07-04 DIAGNOSIS — N186 End stage renal disease: Secondary | ICD-10-CM | POA: Diagnosis not present

## 2020-07-04 DIAGNOSIS — N2581 Secondary hyperparathyroidism of renal origin: Secondary | ICD-10-CM | POA: Diagnosis not present

## 2020-07-04 DIAGNOSIS — Z992 Dependence on renal dialysis: Secondary | ICD-10-CM | POA: Diagnosis not present

## 2020-07-04 DIAGNOSIS — D500 Iron deficiency anemia secondary to blood loss (chronic): Secondary | ICD-10-CM | POA: Diagnosis not present

## 2020-07-06 DIAGNOSIS — D631 Anemia in chronic kidney disease: Secondary | ICD-10-CM | POA: Diagnosis not present

## 2020-07-06 DIAGNOSIS — D509 Iron deficiency anemia, unspecified: Secondary | ICD-10-CM | POA: Diagnosis not present

## 2020-07-06 DIAGNOSIS — N2581 Secondary hyperparathyroidism of renal origin: Secondary | ICD-10-CM | POA: Diagnosis not present

## 2020-07-06 DIAGNOSIS — N186 End stage renal disease: Secondary | ICD-10-CM | POA: Diagnosis not present

## 2020-07-06 DIAGNOSIS — D500 Iron deficiency anemia secondary to blood loss (chronic): Secondary | ICD-10-CM | POA: Diagnosis not present

## 2020-07-06 DIAGNOSIS — Z992 Dependence on renal dialysis: Secondary | ICD-10-CM | POA: Diagnosis not present

## 2020-07-08 DIAGNOSIS — D631 Anemia in chronic kidney disease: Secondary | ICD-10-CM | POA: Diagnosis not present

## 2020-07-08 DIAGNOSIS — N2581 Secondary hyperparathyroidism of renal origin: Secondary | ICD-10-CM | POA: Diagnosis not present

## 2020-07-08 DIAGNOSIS — N186 End stage renal disease: Secondary | ICD-10-CM | POA: Diagnosis not present

## 2020-07-08 DIAGNOSIS — D509 Iron deficiency anemia, unspecified: Secondary | ICD-10-CM | POA: Diagnosis not present

## 2020-07-08 DIAGNOSIS — Z992 Dependence on renal dialysis: Secondary | ICD-10-CM | POA: Diagnosis not present

## 2020-07-08 DIAGNOSIS — D500 Iron deficiency anemia secondary to blood loss (chronic): Secondary | ICD-10-CM | POA: Diagnosis not present

## 2020-07-11 DIAGNOSIS — D509 Iron deficiency anemia, unspecified: Secondary | ICD-10-CM | POA: Diagnosis not present

## 2020-07-11 DIAGNOSIS — D500 Iron deficiency anemia secondary to blood loss (chronic): Secondary | ICD-10-CM | POA: Diagnosis not present

## 2020-07-11 DIAGNOSIS — Z992 Dependence on renal dialysis: Secondary | ICD-10-CM | POA: Diagnosis not present

## 2020-07-11 DIAGNOSIS — N2581 Secondary hyperparathyroidism of renal origin: Secondary | ICD-10-CM | POA: Diagnosis not present

## 2020-07-11 DIAGNOSIS — N186 End stage renal disease: Secondary | ICD-10-CM | POA: Diagnosis not present

## 2020-07-11 DIAGNOSIS — D631 Anemia in chronic kidney disease: Secondary | ICD-10-CM | POA: Diagnosis not present

## 2020-07-13 DIAGNOSIS — D631 Anemia in chronic kidney disease: Secondary | ICD-10-CM | POA: Diagnosis not present

## 2020-07-13 DIAGNOSIS — N2581 Secondary hyperparathyroidism of renal origin: Secondary | ICD-10-CM | POA: Diagnosis not present

## 2020-07-13 DIAGNOSIS — D509 Iron deficiency anemia, unspecified: Secondary | ICD-10-CM | POA: Diagnosis not present

## 2020-07-13 DIAGNOSIS — D500 Iron deficiency anemia secondary to blood loss (chronic): Secondary | ICD-10-CM | POA: Diagnosis not present

## 2020-07-13 DIAGNOSIS — Z992 Dependence on renal dialysis: Secondary | ICD-10-CM | POA: Diagnosis not present

## 2020-07-13 DIAGNOSIS — N186 End stage renal disease: Secondary | ICD-10-CM | POA: Diagnosis not present

## 2020-07-15 DIAGNOSIS — Z992 Dependence on renal dialysis: Secondary | ICD-10-CM | POA: Diagnosis not present

## 2020-07-15 DIAGNOSIS — D631 Anemia in chronic kidney disease: Secondary | ICD-10-CM | POA: Diagnosis not present

## 2020-07-15 DIAGNOSIS — D500 Iron deficiency anemia secondary to blood loss (chronic): Secondary | ICD-10-CM | POA: Diagnosis not present

## 2020-07-15 DIAGNOSIS — N2581 Secondary hyperparathyroidism of renal origin: Secondary | ICD-10-CM | POA: Diagnosis not present

## 2020-07-15 DIAGNOSIS — N186 End stage renal disease: Secondary | ICD-10-CM | POA: Diagnosis not present

## 2020-07-15 DIAGNOSIS — D509 Iron deficiency anemia, unspecified: Secondary | ICD-10-CM | POA: Diagnosis not present

## 2020-07-18 DIAGNOSIS — R52 Pain, unspecified: Secondary | ICD-10-CM | POA: Diagnosis not present

## 2020-07-18 DIAGNOSIS — Z992 Dependence on renal dialysis: Secondary | ICD-10-CM | POA: Diagnosis not present

## 2020-07-18 DIAGNOSIS — D631 Anemia in chronic kidney disease: Secondary | ICD-10-CM | POA: Diagnosis not present

## 2020-07-18 DIAGNOSIS — N186 End stage renal disease: Secondary | ICD-10-CM | POA: Diagnosis not present

## 2020-07-18 DIAGNOSIS — D500 Iron deficiency anemia secondary to blood loss (chronic): Secondary | ICD-10-CM | POA: Diagnosis not present

## 2020-07-18 DIAGNOSIS — T82898A Other specified complication of vascular prosthetic devices, implants and grafts, initial encounter: Secondary | ICD-10-CM | POA: Diagnosis not present

## 2020-07-18 DIAGNOSIS — D509 Iron deficiency anemia, unspecified: Secondary | ICD-10-CM | POA: Diagnosis not present

## 2020-07-18 DIAGNOSIS — E1122 Type 2 diabetes mellitus with diabetic chronic kidney disease: Secondary | ICD-10-CM | POA: Diagnosis not present

## 2020-07-18 DIAGNOSIS — T82858A Stenosis of vascular prosthetic devices, implants and grafts, initial encounter: Secondary | ICD-10-CM | POA: Diagnosis not present

## 2020-07-18 DIAGNOSIS — N2581 Secondary hyperparathyroidism of renal origin: Secondary | ICD-10-CM | POA: Diagnosis not present

## 2020-07-18 DIAGNOSIS — R110 Nausea: Secondary | ICD-10-CM | POA: Diagnosis not present

## 2020-07-20 DIAGNOSIS — D631 Anemia in chronic kidney disease: Secondary | ICD-10-CM | POA: Diagnosis not present

## 2020-07-20 DIAGNOSIS — D500 Iron deficiency anemia secondary to blood loss (chronic): Secondary | ICD-10-CM | POA: Diagnosis not present

## 2020-07-20 DIAGNOSIS — Z992 Dependence on renal dialysis: Secondary | ICD-10-CM | POA: Diagnosis not present

## 2020-07-20 DIAGNOSIS — N2581 Secondary hyperparathyroidism of renal origin: Secondary | ICD-10-CM | POA: Diagnosis not present

## 2020-07-20 DIAGNOSIS — D509 Iron deficiency anemia, unspecified: Secondary | ICD-10-CM | POA: Diagnosis not present

## 2020-07-20 DIAGNOSIS — N186 End stage renal disease: Secondary | ICD-10-CM | POA: Diagnosis not present

## 2020-07-21 DIAGNOSIS — M86171 Other acute osteomyelitis, right ankle and foot: Secondary | ICD-10-CM | POA: Diagnosis not present

## 2020-07-21 DIAGNOSIS — I739 Peripheral vascular disease, unspecified: Secondary | ICD-10-CM | POA: Diagnosis not present

## 2020-07-21 DIAGNOSIS — Z89421 Acquired absence of other right toe(s): Secondary | ICD-10-CM | POA: Diagnosis not present

## 2020-07-21 DIAGNOSIS — Z992 Dependence on renal dialysis: Secondary | ICD-10-CM | POA: Diagnosis not present

## 2020-07-21 DIAGNOSIS — L97512 Non-pressure chronic ulcer of other part of right foot with fat layer exposed: Secondary | ICD-10-CM | POA: Diagnosis not present

## 2020-07-21 DIAGNOSIS — E1140 Type 2 diabetes mellitus with diabetic neuropathy, unspecified: Secondary | ICD-10-CM | POA: Diagnosis not present

## 2020-07-22 DIAGNOSIS — N186 End stage renal disease: Secondary | ICD-10-CM | POA: Diagnosis not present

## 2020-07-22 DIAGNOSIS — D500 Iron deficiency anemia secondary to blood loss (chronic): Secondary | ICD-10-CM | POA: Diagnosis not present

## 2020-07-22 DIAGNOSIS — Z992 Dependence on renal dialysis: Secondary | ICD-10-CM | POA: Diagnosis not present

## 2020-07-22 DIAGNOSIS — D509 Iron deficiency anemia, unspecified: Secondary | ICD-10-CM | POA: Diagnosis not present

## 2020-07-22 DIAGNOSIS — N2581 Secondary hyperparathyroidism of renal origin: Secondary | ICD-10-CM | POA: Diagnosis not present

## 2020-07-22 DIAGNOSIS — D631 Anemia in chronic kidney disease: Secondary | ICD-10-CM | POA: Diagnosis not present

## 2020-07-25 DIAGNOSIS — D500 Iron deficiency anemia secondary to blood loss (chronic): Secondary | ICD-10-CM | POA: Diagnosis not present

## 2020-07-25 DIAGNOSIS — Z992 Dependence on renal dialysis: Secondary | ICD-10-CM | POA: Diagnosis not present

## 2020-07-25 DIAGNOSIS — D631 Anemia in chronic kidney disease: Secondary | ICD-10-CM | POA: Diagnosis not present

## 2020-07-25 DIAGNOSIS — N2581 Secondary hyperparathyroidism of renal origin: Secondary | ICD-10-CM | POA: Diagnosis not present

## 2020-07-25 DIAGNOSIS — D509 Iron deficiency anemia, unspecified: Secondary | ICD-10-CM | POA: Diagnosis not present

## 2020-07-25 DIAGNOSIS — N186 End stage renal disease: Secondary | ICD-10-CM | POA: Diagnosis not present

## 2020-07-27 DIAGNOSIS — D509 Iron deficiency anemia, unspecified: Secondary | ICD-10-CM | POA: Diagnosis not present

## 2020-07-27 DIAGNOSIS — D500 Iron deficiency anemia secondary to blood loss (chronic): Secondary | ICD-10-CM | POA: Diagnosis not present

## 2020-07-27 DIAGNOSIS — Z992 Dependence on renal dialysis: Secondary | ICD-10-CM | POA: Diagnosis not present

## 2020-07-27 DIAGNOSIS — N2581 Secondary hyperparathyroidism of renal origin: Secondary | ICD-10-CM | POA: Diagnosis not present

## 2020-07-27 DIAGNOSIS — N186 End stage renal disease: Secondary | ICD-10-CM | POA: Diagnosis not present

## 2020-07-27 DIAGNOSIS — D631 Anemia in chronic kidney disease: Secondary | ICD-10-CM | POA: Diagnosis not present

## 2020-07-29 DIAGNOSIS — N2581 Secondary hyperparathyroidism of renal origin: Secondary | ICD-10-CM | POA: Diagnosis not present

## 2020-07-29 DIAGNOSIS — N186 End stage renal disease: Secondary | ICD-10-CM | POA: Diagnosis not present

## 2020-07-29 DIAGNOSIS — D500 Iron deficiency anemia secondary to blood loss (chronic): Secondary | ICD-10-CM | POA: Diagnosis not present

## 2020-07-29 DIAGNOSIS — D509 Iron deficiency anemia, unspecified: Secondary | ICD-10-CM | POA: Diagnosis not present

## 2020-07-29 DIAGNOSIS — D631 Anemia in chronic kidney disease: Secondary | ICD-10-CM | POA: Diagnosis not present

## 2020-07-29 DIAGNOSIS — Z992 Dependence on renal dialysis: Secondary | ICD-10-CM | POA: Diagnosis not present

## 2020-07-30 DIAGNOSIS — N186 End stage renal disease: Secondary | ICD-10-CM | POA: Diagnosis not present

## 2020-07-30 DIAGNOSIS — Z992 Dependence on renal dialysis: Secondary | ICD-10-CM | POA: Diagnosis not present

## 2020-08-01 DIAGNOSIS — D509 Iron deficiency anemia, unspecified: Secondary | ICD-10-CM | POA: Diagnosis not present

## 2020-08-01 DIAGNOSIS — N2581 Secondary hyperparathyroidism of renal origin: Secondary | ICD-10-CM | POA: Diagnosis not present

## 2020-08-01 DIAGNOSIS — Z992 Dependence on renal dialysis: Secondary | ICD-10-CM | POA: Diagnosis not present

## 2020-08-01 DIAGNOSIS — D631 Anemia in chronic kidney disease: Secondary | ICD-10-CM | POA: Diagnosis not present

## 2020-08-01 DIAGNOSIS — N186 End stage renal disease: Secondary | ICD-10-CM | POA: Diagnosis not present

## 2020-08-03 DIAGNOSIS — D631 Anemia in chronic kidney disease: Secondary | ICD-10-CM | POA: Diagnosis not present

## 2020-08-03 DIAGNOSIS — N186 End stage renal disease: Secondary | ICD-10-CM | POA: Diagnosis not present

## 2020-08-03 DIAGNOSIS — D509 Iron deficiency anemia, unspecified: Secondary | ICD-10-CM | POA: Diagnosis not present

## 2020-08-03 DIAGNOSIS — N2581 Secondary hyperparathyroidism of renal origin: Secondary | ICD-10-CM | POA: Diagnosis not present

## 2020-08-03 DIAGNOSIS — Z992 Dependence on renal dialysis: Secondary | ICD-10-CM | POA: Diagnosis not present

## 2020-08-05 DIAGNOSIS — N186 End stage renal disease: Secondary | ICD-10-CM | POA: Diagnosis not present

## 2020-08-05 DIAGNOSIS — Z992 Dependence on renal dialysis: Secondary | ICD-10-CM | POA: Diagnosis not present

## 2020-08-05 DIAGNOSIS — D631 Anemia in chronic kidney disease: Secondary | ICD-10-CM | POA: Diagnosis not present

## 2020-08-05 DIAGNOSIS — N2581 Secondary hyperparathyroidism of renal origin: Secondary | ICD-10-CM | POA: Diagnosis not present

## 2020-08-05 DIAGNOSIS — D509 Iron deficiency anemia, unspecified: Secondary | ICD-10-CM | POA: Diagnosis not present

## 2020-08-08 DIAGNOSIS — Z992 Dependence on renal dialysis: Secondary | ICD-10-CM | POA: Diagnosis not present

## 2020-08-08 DIAGNOSIS — D509 Iron deficiency anemia, unspecified: Secondary | ICD-10-CM | POA: Diagnosis not present

## 2020-08-08 DIAGNOSIS — N2581 Secondary hyperparathyroidism of renal origin: Secondary | ICD-10-CM | POA: Diagnosis not present

## 2020-08-08 DIAGNOSIS — D631 Anemia in chronic kidney disease: Secondary | ICD-10-CM | POA: Diagnosis not present

## 2020-08-08 DIAGNOSIS — N186 End stage renal disease: Secondary | ICD-10-CM | POA: Diagnosis not present

## 2020-08-09 DIAGNOSIS — Z89421 Acquired absence of other right toe(s): Secondary | ICD-10-CM | POA: Diagnosis not present

## 2020-08-09 DIAGNOSIS — L97512 Non-pressure chronic ulcer of other part of right foot with fat layer exposed: Secondary | ICD-10-CM | POA: Diagnosis not present

## 2020-08-09 DIAGNOSIS — M2010 Hallux valgus (acquired), unspecified foot: Secondary | ICD-10-CM | POA: Diagnosis not present

## 2020-08-09 DIAGNOSIS — E1140 Type 2 diabetes mellitus with diabetic neuropathy, unspecified: Secondary | ICD-10-CM | POA: Diagnosis not present

## 2020-08-10 DIAGNOSIS — N186 End stage renal disease: Secondary | ICD-10-CM | POA: Diagnosis not present

## 2020-08-10 DIAGNOSIS — D509 Iron deficiency anemia, unspecified: Secondary | ICD-10-CM | POA: Diagnosis not present

## 2020-08-10 DIAGNOSIS — N2581 Secondary hyperparathyroidism of renal origin: Secondary | ICD-10-CM | POA: Diagnosis not present

## 2020-08-10 DIAGNOSIS — D631 Anemia in chronic kidney disease: Secondary | ICD-10-CM | POA: Diagnosis not present

## 2020-08-10 DIAGNOSIS — Z992 Dependence on renal dialysis: Secondary | ICD-10-CM | POA: Diagnosis not present

## 2020-08-12 DIAGNOSIS — D509 Iron deficiency anemia, unspecified: Secondary | ICD-10-CM | POA: Diagnosis not present

## 2020-08-12 DIAGNOSIS — N2581 Secondary hyperparathyroidism of renal origin: Secondary | ICD-10-CM | POA: Diagnosis not present

## 2020-08-12 DIAGNOSIS — D631 Anemia in chronic kidney disease: Secondary | ICD-10-CM | POA: Diagnosis not present

## 2020-08-12 DIAGNOSIS — N186 End stage renal disease: Secondary | ICD-10-CM | POA: Diagnosis not present

## 2020-08-12 DIAGNOSIS — Z992 Dependence on renal dialysis: Secondary | ICD-10-CM | POA: Diagnosis not present

## 2020-08-15 DIAGNOSIS — D509 Iron deficiency anemia, unspecified: Secondary | ICD-10-CM | POA: Diagnosis not present

## 2020-08-15 DIAGNOSIS — N186 End stage renal disease: Secondary | ICD-10-CM | POA: Diagnosis not present

## 2020-08-15 DIAGNOSIS — N2581 Secondary hyperparathyroidism of renal origin: Secondary | ICD-10-CM | POA: Diagnosis not present

## 2020-08-15 DIAGNOSIS — Z992 Dependence on renal dialysis: Secondary | ICD-10-CM | POA: Diagnosis not present

## 2020-08-15 DIAGNOSIS — D631 Anemia in chronic kidney disease: Secondary | ICD-10-CM | POA: Diagnosis not present

## 2020-08-17 DIAGNOSIS — N186 End stage renal disease: Secondary | ICD-10-CM | POA: Diagnosis not present

## 2020-08-17 DIAGNOSIS — Z992 Dependence on renal dialysis: Secondary | ICD-10-CM | POA: Diagnosis not present

## 2020-08-17 DIAGNOSIS — D631 Anemia in chronic kidney disease: Secondary | ICD-10-CM | POA: Diagnosis not present

## 2020-08-17 DIAGNOSIS — D509 Iron deficiency anemia, unspecified: Secondary | ICD-10-CM | POA: Diagnosis not present

## 2020-08-17 DIAGNOSIS — N2581 Secondary hyperparathyroidism of renal origin: Secondary | ICD-10-CM | POA: Diagnosis not present

## 2020-08-19 DIAGNOSIS — Z992 Dependence on renal dialysis: Secondary | ICD-10-CM | POA: Diagnosis not present

## 2020-08-19 DIAGNOSIS — D509 Iron deficiency anemia, unspecified: Secondary | ICD-10-CM | POA: Diagnosis not present

## 2020-08-19 DIAGNOSIS — N2581 Secondary hyperparathyroidism of renal origin: Secondary | ICD-10-CM | POA: Diagnosis not present

## 2020-08-19 DIAGNOSIS — N186 End stage renal disease: Secondary | ICD-10-CM | POA: Diagnosis not present

## 2020-08-19 DIAGNOSIS — D631 Anemia in chronic kidney disease: Secondary | ICD-10-CM | POA: Diagnosis not present

## 2020-08-22 DIAGNOSIS — Z992 Dependence on renal dialysis: Secondary | ICD-10-CM | POA: Diagnosis not present

## 2020-08-22 DIAGNOSIS — N2581 Secondary hyperparathyroidism of renal origin: Secondary | ICD-10-CM | POA: Diagnosis not present

## 2020-08-22 DIAGNOSIS — D631 Anemia in chronic kidney disease: Secondary | ICD-10-CM | POA: Diagnosis not present

## 2020-08-22 DIAGNOSIS — D509 Iron deficiency anemia, unspecified: Secondary | ICD-10-CM | POA: Diagnosis not present

## 2020-08-22 DIAGNOSIS — N186 End stage renal disease: Secondary | ICD-10-CM | POA: Diagnosis not present

## 2020-08-24 DIAGNOSIS — D509 Iron deficiency anemia, unspecified: Secondary | ICD-10-CM | POA: Diagnosis not present

## 2020-08-24 DIAGNOSIS — N2581 Secondary hyperparathyroidism of renal origin: Secondary | ICD-10-CM | POA: Diagnosis not present

## 2020-08-24 DIAGNOSIS — D631 Anemia in chronic kidney disease: Secondary | ICD-10-CM | POA: Diagnosis not present

## 2020-08-24 DIAGNOSIS — Z992 Dependence on renal dialysis: Secondary | ICD-10-CM | POA: Diagnosis not present

## 2020-08-24 DIAGNOSIS — N186 End stage renal disease: Secondary | ICD-10-CM | POA: Diagnosis not present

## 2020-08-26 DIAGNOSIS — N2581 Secondary hyperparathyroidism of renal origin: Secondary | ICD-10-CM | POA: Diagnosis not present

## 2020-08-26 DIAGNOSIS — N186 End stage renal disease: Secondary | ICD-10-CM | POA: Diagnosis not present

## 2020-08-26 DIAGNOSIS — Z992 Dependence on renal dialysis: Secondary | ICD-10-CM | POA: Diagnosis not present

## 2020-08-26 DIAGNOSIS — D509 Iron deficiency anemia, unspecified: Secondary | ICD-10-CM | POA: Diagnosis not present

## 2020-08-26 DIAGNOSIS — D631 Anemia in chronic kidney disease: Secondary | ICD-10-CM | POA: Diagnosis not present

## 2020-08-29 DIAGNOSIS — N2581 Secondary hyperparathyroidism of renal origin: Secondary | ICD-10-CM | POA: Diagnosis not present

## 2020-08-29 DIAGNOSIS — Z992 Dependence on renal dialysis: Secondary | ICD-10-CM | POA: Diagnosis not present

## 2020-08-29 DIAGNOSIS — D631 Anemia in chronic kidney disease: Secondary | ICD-10-CM | POA: Diagnosis not present

## 2020-08-29 DIAGNOSIS — N186 End stage renal disease: Secondary | ICD-10-CM | POA: Diagnosis not present

## 2020-08-29 DIAGNOSIS — D509 Iron deficiency anemia, unspecified: Secondary | ICD-10-CM | POA: Diagnosis not present

## 2020-08-30 DIAGNOSIS — I639 Cerebral infarction, unspecified: Secondary | ICD-10-CM | POA: Diagnosis not present

## 2020-08-30 DIAGNOSIS — N186 End stage renal disease: Secondary | ICD-10-CM | POA: Diagnosis not present

## 2020-08-30 DIAGNOSIS — G8194 Hemiplegia, unspecified affecting left nondominant side: Secondary | ICD-10-CM | POA: Diagnosis not present

## 2020-08-30 DIAGNOSIS — Z299 Encounter for prophylactic measures, unspecified: Secondary | ICD-10-CM | POA: Diagnosis not present

## 2020-08-30 DIAGNOSIS — I10 Essential (primary) hypertension: Secondary | ICD-10-CM | POA: Diagnosis not present

## 2020-08-30 DIAGNOSIS — E1165 Type 2 diabetes mellitus with hyperglycemia: Secondary | ICD-10-CM | POA: Diagnosis not present

## 2020-08-30 DIAGNOSIS — Z992 Dependence on renal dialysis: Secondary | ICD-10-CM | POA: Diagnosis not present

## 2020-08-31 DIAGNOSIS — D509 Iron deficiency anemia, unspecified: Secondary | ICD-10-CM | POA: Diagnosis not present

## 2020-08-31 DIAGNOSIS — N186 End stage renal disease: Secondary | ICD-10-CM | POA: Diagnosis not present

## 2020-08-31 DIAGNOSIS — N2581 Secondary hyperparathyroidism of renal origin: Secondary | ICD-10-CM | POA: Diagnosis not present

## 2020-08-31 DIAGNOSIS — Z992 Dependence on renal dialysis: Secondary | ICD-10-CM | POA: Diagnosis not present

## 2020-08-31 DIAGNOSIS — D631 Anemia in chronic kidney disease: Secondary | ICD-10-CM | POA: Diagnosis not present

## 2020-09-01 DIAGNOSIS — M2010 Hallux valgus (acquired), unspecified foot: Secondary | ICD-10-CM | POA: Diagnosis not present

## 2020-09-01 DIAGNOSIS — L97512 Non-pressure chronic ulcer of other part of right foot with fat layer exposed: Secondary | ICD-10-CM | POA: Diagnosis not present

## 2020-09-01 DIAGNOSIS — I739 Peripheral vascular disease, unspecified: Secondary | ICD-10-CM | POA: Diagnosis not present

## 2020-09-01 DIAGNOSIS — E1140 Type 2 diabetes mellitus with diabetic neuropathy, unspecified: Secondary | ICD-10-CM | POA: Diagnosis not present

## 2020-09-02 DIAGNOSIS — N2581 Secondary hyperparathyroidism of renal origin: Secondary | ICD-10-CM | POA: Diagnosis not present

## 2020-09-02 DIAGNOSIS — D509 Iron deficiency anemia, unspecified: Secondary | ICD-10-CM | POA: Diagnosis not present

## 2020-09-02 DIAGNOSIS — N186 End stage renal disease: Secondary | ICD-10-CM | POA: Diagnosis not present

## 2020-09-02 DIAGNOSIS — D631 Anemia in chronic kidney disease: Secondary | ICD-10-CM | POA: Diagnosis not present

## 2020-09-02 DIAGNOSIS — Z992 Dependence on renal dialysis: Secondary | ICD-10-CM | POA: Diagnosis not present

## 2020-09-05 DIAGNOSIS — D509 Iron deficiency anemia, unspecified: Secondary | ICD-10-CM | POA: Diagnosis not present

## 2020-09-05 DIAGNOSIS — N2581 Secondary hyperparathyroidism of renal origin: Secondary | ICD-10-CM | POA: Diagnosis not present

## 2020-09-05 DIAGNOSIS — N186 End stage renal disease: Secondary | ICD-10-CM | POA: Diagnosis not present

## 2020-09-05 DIAGNOSIS — Z992 Dependence on renal dialysis: Secondary | ICD-10-CM | POA: Diagnosis not present

## 2020-09-05 DIAGNOSIS — D631 Anemia in chronic kidney disease: Secondary | ICD-10-CM | POA: Diagnosis not present

## 2020-09-07 DIAGNOSIS — D631 Anemia in chronic kidney disease: Secondary | ICD-10-CM | POA: Diagnosis not present

## 2020-09-07 DIAGNOSIS — D509 Iron deficiency anemia, unspecified: Secondary | ICD-10-CM | POA: Diagnosis not present

## 2020-09-07 DIAGNOSIS — N2581 Secondary hyperparathyroidism of renal origin: Secondary | ICD-10-CM | POA: Diagnosis not present

## 2020-09-07 DIAGNOSIS — N186 End stage renal disease: Secondary | ICD-10-CM | POA: Diagnosis not present

## 2020-09-07 DIAGNOSIS — Z992 Dependence on renal dialysis: Secondary | ICD-10-CM | POA: Diagnosis not present

## 2020-09-09 DIAGNOSIS — D631 Anemia in chronic kidney disease: Secondary | ICD-10-CM | POA: Diagnosis not present

## 2020-09-09 DIAGNOSIS — Z992 Dependence on renal dialysis: Secondary | ICD-10-CM | POA: Diagnosis not present

## 2020-09-09 DIAGNOSIS — N2581 Secondary hyperparathyroidism of renal origin: Secondary | ICD-10-CM | POA: Diagnosis not present

## 2020-09-09 DIAGNOSIS — D509 Iron deficiency anemia, unspecified: Secondary | ICD-10-CM | POA: Diagnosis not present

## 2020-09-09 DIAGNOSIS — N186 End stage renal disease: Secondary | ICD-10-CM | POA: Diagnosis not present

## 2020-09-12 DIAGNOSIS — D509 Iron deficiency anemia, unspecified: Secondary | ICD-10-CM | POA: Diagnosis not present

## 2020-09-12 DIAGNOSIS — Z992 Dependence on renal dialysis: Secondary | ICD-10-CM | POA: Diagnosis not present

## 2020-09-12 DIAGNOSIS — D631 Anemia in chronic kidney disease: Secondary | ICD-10-CM | POA: Diagnosis not present

## 2020-09-12 DIAGNOSIS — N186 End stage renal disease: Secondary | ICD-10-CM | POA: Diagnosis not present

## 2020-09-12 DIAGNOSIS — N2581 Secondary hyperparathyroidism of renal origin: Secondary | ICD-10-CM | POA: Diagnosis not present

## 2020-09-14 DIAGNOSIS — N186 End stage renal disease: Secondary | ICD-10-CM | POA: Diagnosis not present

## 2020-09-14 DIAGNOSIS — D509 Iron deficiency anemia, unspecified: Secondary | ICD-10-CM | POA: Diagnosis not present

## 2020-09-14 DIAGNOSIS — N2581 Secondary hyperparathyroidism of renal origin: Secondary | ICD-10-CM | POA: Diagnosis not present

## 2020-09-14 DIAGNOSIS — Z992 Dependence on renal dialysis: Secondary | ICD-10-CM | POA: Diagnosis not present

## 2020-09-14 DIAGNOSIS — D631 Anemia in chronic kidney disease: Secondary | ICD-10-CM | POA: Diagnosis not present

## 2020-09-16 DIAGNOSIS — D509 Iron deficiency anemia, unspecified: Secondary | ICD-10-CM | POA: Diagnosis not present

## 2020-09-16 DIAGNOSIS — N186 End stage renal disease: Secondary | ICD-10-CM | POA: Diagnosis not present

## 2020-09-16 DIAGNOSIS — Z992 Dependence on renal dialysis: Secondary | ICD-10-CM | POA: Diagnosis not present

## 2020-09-16 DIAGNOSIS — N2581 Secondary hyperparathyroidism of renal origin: Secondary | ICD-10-CM | POA: Diagnosis not present

## 2020-09-16 DIAGNOSIS — D631 Anemia in chronic kidney disease: Secondary | ICD-10-CM | POA: Diagnosis not present

## 2020-09-19 DIAGNOSIS — D631 Anemia in chronic kidney disease: Secondary | ICD-10-CM | POA: Diagnosis not present

## 2020-09-19 DIAGNOSIS — Z992 Dependence on renal dialysis: Secondary | ICD-10-CM | POA: Diagnosis not present

## 2020-09-19 DIAGNOSIS — D509 Iron deficiency anemia, unspecified: Secondary | ICD-10-CM | POA: Diagnosis not present

## 2020-09-19 DIAGNOSIS — N2581 Secondary hyperparathyroidism of renal origin: Secondary | ICD-10-CM | POA: Diagnosis not present

## 2020-09-19 DIAGNOSIS — N186 End stage renal disease: Secondary | ICD-10-CM | POA: Diagnosis not present

## 2020-09-21 DIAGNOSIS — D631 Anemia in chronic kidney disease: Secondary | ICD-10-CM | POA: Diagnosis not present

## 2020-09-21 DIAGNOSIS — D509 Iron deficiency anemia, unspecified: Secondary | ICD-10-CM | POA: Diagnosis not present

## 2020-09-21 DIAGNOSIS — Z992 Dependence on renal dialysis: Secondary | ICD-10-CM | POA: Diagnosis not present

## 2020-09-21 DIAGNOSIS — N186 End stage renal disease: Secondary | ICD-10-CM | POA: Diagnosis not present

## 2020-09-21 DIAGNOSIS — N2581 Secondary hyperparathyroidism of renal origin: Secondary | ICD-10-CM | POA: Diagnosis not present

## 2020-09-22 DIAGNOSIS — Z992 Dependence on renal dialysis: Secondary | ICD-10-CM | POA: Diagnosis not present

## 2020-09-22 DIAGNOSIS — E1140 Type 2 diabetes mellitus with diabetic neuropathy, unspecified: Secondary | ICD-10-CM | POA: Diagnosis not present

## 2020-09-22 DIAGNOSIS — L97512 Non-pressure chronic ulcer of other part of right foot with fat layer exposed: Secondary | ICD-10-CM | POA: Diagnosis not present

## 2020-09-22 DIAGNOSIS — I739 Peripheral vascular disease, unspecified: Secondary | ICD-10-CM | POA: Diagnosis not present

## 2020-09-22 DIAGNOSIS — Z89421 Acquired absence of other right toe(s): Secondary | ICD-10-CM | POA: Diagnosis not present

## 2020-09-23 DIAGNOSIS — N2581 Secondary hyperparathyroidism of renal origin: Secondary | ICD-10-CM | POA: Diagnosis not present

## 2020-09-23 DIAGNOSIS — D509 Iron deficiency anemia, unspecified: Secondary | ICD-10-CM | POA: Diagnosis not present

## 2020-09-23 DIAGNOSIS — D631 Anemia in chronic kidney disease: Secondary | ICD-10-CM | POA: Diagnosis not present

## 2020-09-23 DIAGNOSIS — N186 End stage renal disease: Secondary | ICD-10-CM | POA: Diagnosis not present

## 2020-09-23 DIAGNOSIS — Z992 Dependence on renal dialysis: Secondary | ICD-10-CM | POA: Diagnosis not present

## 2020-09-26 DIAGNOSIS — N186 End stage renal disease: Secondary | ICD-10-CM | POA: Diagnosis not present

## 2020-09-26 DIAGNOSIS — D509 Iron deficiency anemia, unspecified: Secondary | ICD-10-CM | POA: Diagnosis not present

## 2020-09-26 DIAGNOSIS — Z992 Dependence on renal dialysis: Secondary | ICD-10-CM | POA: Diagnosis not present

## 2020-09-26 DIAGNOSIS — N2581 Secondary hyperparathyroidism of renal origin: Secondary | ICD-10-CM | POA: Diagnosis not present

## 2020-09-26 DIAGNOSIS — D631 Anemia in chronic kidney disease: Secondary | ICD-10-CM | POA: Diagnosis not present

## 2020-09-28 DIAGNOSIS — D631 Anemia in chronic kidney disease: Secondary | ICD-10-CM | POA: Diagnosis not present

## 2020-09-28 DIAGNOSIS — D509 Iron deficiency anemia, unspecified: Secondary | ICD-10-CM | POA: Diagnosis not present

## 2020-09-28 DIAGNOSIS — N186 End stage renal disease: Secondary | ICD-10-CM | POA: Diagnosis not present

## 2020-09-28 DIAGNOSIS — Z992 Dependence on renal dialysis: Secondary | ICD-10-CM | POA: Diagnosis not present

## 2020-09-28 DIAGNOSIS — N2581 Secondary hyperparathyroidism of renal origin: Secondary | ICD-10-CM | POA: Diagnosis not present

## 2020-09-29 DIAGNOSIS — N186 End stage renal disease: Secondary | ICD-10-CM | POA: Diagnosis not present

## 2020-09-29 DIAGNOSIS — Z992 Dependence on renal dialysis: Secondary | ICD-10-CM | POA: Diagnosis not present

## 2020-09-30 DIAGNOSIS — N2581 Secondary hyperparathyroidism of renal origin: Secondary | ICD-10-CM | POA: Diagnosis not present

## 2020-09-30 DIAGNOSIS — N186 End stage renal disease: Secondary | ICD-10-CM | POA: Diagnosis not present

## 2020-09-30 DIAGNOSIS — D631 Anemia in chronic kidney disease: Secondary | ICD-10-CM | POA: Diagnosis not present

## 2020-09-30 DIAGNOSIS — Z992 Dependence on renal dialysis: Secondary | ICD-10-CM | POA: Diagnosis not present

## 2020-09-30 DIAGNOSIS — D509 Iron deficiency anemia, unspecified: Secondary | ICD-10-CM | POA: Diagnosis not present

## 2020-10-03 DIAGNOSIS — N186 End stage renal disease: Secondary | ICD-10-CM | POA: Diagnosis not present

## 2020-10-03 DIAGNOSIS — N2581 Secondary hyperparathyroidism of renal origin: Secondary | ICD-10-CM | POA: Diagnosis not present

## 2020-10-03 DIAGNOSIS — D509 Iron deficiency anemia, unspecified: Secondary | ICD-10-CM | POA: Diagnosis not present

## 2020-10-03 DIAGNOSIS — Z992 Dependence on renal dialysis: Secondary | ICD-10-CM | POA: Diagnosis not present

## 2020-10-03 DIAGNOSIS — E1129 Type 2 diabetes mellitus with other diabetic kidney complication: Secondary | ICD-10-CM | POA: Diagnosis not present

## 2020-10-03 DIAGNOSIS — D631 Anemia in chronic kidney disease: Secondary | ICD-10-CM | POA: Diagnosis not present

## 2020-10-05 DIAGNOSIS — N2581 Secondary hyperparathyroidism of renal origin: Secondary | ICD-10-CM | POA: Diagnosis not present

## 2020-10-05 DIAGNOSIS — N186 End stage renal disease: Secondary | ICD-10-CM | POA: Diagnosis not present

## 2020-10-05 DIAGNOSIS — Z992 Dependence on renal dialysis: Secondary | ICD-10-CM | POA: Diagnosis not present

## 2020-10-05 DIAGNOSIS — D509 Iron deficiency anemia, unspecified: Secondary | ICD-10-CM | POA: Diagnosis not present

## 2020-10-05 DIAGNOSIS — D631 Anemia in chronic kidney disease: Secondary | ICD-10-CM | POA: Diagnosis not present

## 2020-10-07 DIAGNOSIS — D509 Iron deficiency anemia, unspecified: Secondary | ICD-10-CM | POA: Diagnosis not present

## 2020-10-07 DIAGNOSIS — Z992 Dependence on renal dialysis: Secondary | ICD-10-CM | POA: Diagnosis not present

## 2020-10-07 DIAGNOSIS — N2581 Secondary hyperparathyroidism of renal origin: Secondary | ICD-10-CM | POA: Diagnosis not present

## 2020-10-07 DIAGNOSIS — D631 Anemia in chronic kidney disease: Secondary | ICD-10-CM | POA: Diagnosis not present

## 2020-10-07 DIAGNOSIS — N186 End stage renal disease: Secondary | ICD-10-CM | POA: Diagnosis not present

## 2020-10-10 DIAGNOSIS — Z992 Dependence on renal dialysis: Secondary | ICD-10-CM | POA: Diagnosis not present

## 2020-10-10 DIAGNOSIS — N2581 Secondary hyperparathyroidism of renal origin: Secondary | ICD-10-CM | POA: Diagnosis not present

## 2020-10-10 DIAGNOSIS — D631 Anemia in chronic kidney disease: Secondary | ICD-10-CM | POA: Diagnosis not present

## 2020-10-10 DIAGNOSIS — D509 Iron deficiency anemia, unspecified: Secondary | ICD-10-CM | POA: Diagnosis not present

## 2020-10-10 DIAGNOSIS — N186 End stage renal disease: Secondary | ICD-10-CM | POA: Diagnosis not present

## 2020-10-12 DIAGNOSIS — N2581 Secondary hyperparathyroidism of renal origin: Secondary | ICD-10-CM | POA: Diagnosis not present

## 2020-10-12 DIAGNOSIS — D631 Anemia in chronic kidney disease: Secondary | ICD-10-CM | POA: Diagnosis not present

## 2020-10-12 DIAGNOSIS — N186 End stage renal disease: Secondary | ICD-10-CM | POA: Diagnosis not present

## 2020-10-12 DIAGNOSIS — D509 Iron deficiency anemia, unspecified: Secondary | ICD-10-CM | POA: Diagnosis not present

## 2020-10-12 DIAGNOSIS — Z992 Dependence on renal dialysis: Secondary | ICD-10-CM | POA: Diagnosis not present

## 2020-10-14 DIAGNOSIS — D509 Iron deficiency anemia, unspecified: Secondary | ICD-10-CM | POA: Diagnosis not present

## 2020-10-14 DIAGNOSIS — N2581 Secondary hyperparathyroidism of renal origin: Secondary | ICD-10-CM | POA: Diagnosis not present

## 2020-10-14 DIAGNOSIS — D631 Anemia in chronic kidney disease: Secondary | ICD-10-CM | POA: Diagnosis not present

## 2020-10-14 DIAGNOSIS — Z992 Dependence on renal dialysis: Secondary | ICD-10-CM | POA: Diagnosis not present

## 2020-10-14 DIAGNOSIS — N186 End stage renal disease: Secondary | ICD-10-CM | POA: Diagnosis not present

## 2020-10-17 DIAGNOSIS — Z992 Dependence on renal dialysis: Secondary | ICD-10-CM | POA: Diagnosis not present

## 2020-10-17 DIAGNOSIS — N186 End stage renal disease: Secondary | ICD-10-CM | POA: Diagnosis not present

## 2020-10-17 DIAGNOSIS — D509 Iron deficiency anemia, unspecified: Secondary | ICD-10-CM | POA: Diagnosis not present

## 2020-10-17 DIAGNOSIS — N2581 Secondary hyperparathyroidism of renal origin: Secondary | ICD-10-CM | POA: Diagnosis not present

## 2020-10-17 DIAGNOSIS — D631 Anemia in chronic kidney disease: Secondary | ICD-10-CM | POA: Diagnosis not present

## 2020-10-19 DIAGNOSIS — D631 Anemia in chronic kidney disease: Secondary | ICD-10-CM | POA: Diagnosis not present

## 2020-10-19 DIAGNOSIS — N186 End stage renal disease: Secondary | ICD-10-CM | POA: Diagnosis not present

## 2020-10-19 DIAGNOSIS — Z992 Dependence on renal dialysis: Secondary | ICD-10-CM | POA: Diagnosis not present

## 2020-10-19 DIAGNOSIS — N2581 Secondary hyperparathyroidism of renal origin: Secondary | ICD-10-CM | POA: Diagnosis not present

## 2020-10-19 DIAGNOSIS — D509 Iron deficiency anemia, unspecified: Secondary | ICD-10-CM | POA: Diagnosis not present

## 2020-10-21 DIAGNOSIS — N2581 Secondary hyperparathyroidism of renal origin: Secondary | ICD-10-CM | POA: Diagnosis not present

## 2020-10-21 DIAGNOSIS — N186 End stage renal disease: Secondary | ICD-10-CM | POA: Diagnosis not present

## 2020-10-21 DIAGNOSIS — D631 Anemia in chronic kidney disease: Secondary | ICD-10-CM | POA: Diagnosis not present

## 2020-10-21 DIAGNOSIS — D509 Iron deficiency anemia, unspecified: Secondary | ICD-10-CM | POA: Diagnosis not present

## 2020-10-21 DIAGNOSIS — Z992 Dependence on renal dialysis: Secondary | ICD-10-CM | POA: Diagnosis not present

## 2020-10-24 DIAGNOSIS — D631 Anemia in chronic kidney disease: Secondary | ICD-10-CM | POA: Diagnosis not present

## 2020-10-24 DIAGNOSIS — Z992 Dependence on renal dialysis: Secondary | ICD-10-CM | POA: Diagnosis not present

## 2020-10-24 DIAGNOSIS — N2581 Secondary hyperparathyroidism of renal origin: Secondary | ICD-10-CM | POA: Diagnosis not present

## 2020-10-24 DIAGNOSIS — N186 End stage renal disease: Secondary | ICD-10-CM | POA: Diagnosis not present

## 2020-10-24 DIAGNOSIS — D509 Iron deficiency anemia, unspecified: Secondary | ICD-10-CM | POA: Diagnosis not present

## 2020-10-26 DIAGNOSIS — D509 Iron deficiency anemia, unspecified: Secondary | ICD-10-CM | POA: Diagnosis not present

## 2020-10-26 DIAGNOSIS — D631 Anemia in chronic kidney disease: Secondary | ICD-10-CM | POA: Diagnosis not present

## 2020-10-26 DIAGNOSIS — N186 End stage renal disease: Secondary | ICD-10-CM | POA: Diagnosis not present

## 2020-10-26 DIAGNOSIS — N2581 Secondary hyperparathyroidism of renal origin: Secondary | ICD-10-CM | POA: Diagnosis not present

## 2020-10-26 DIAGNOSIS — Z992 Dependence on renal dialysis: Secondary | ICD-10-CM | POA: Diagnosis not present

## 2020-10-28 DIAGNOSIS — D631 Anemia in chronic kidney disease: Secondary | ICD-10-CM | POA: Diagnosis not present

## 2020-10-28 DIAGNOSIS — N186 End stage renal disease: Secondary | ICD-10-CM | POA: Diagnosis not present

## 2020-10-28 DIAGNOSIS — N2581 Secondary hyperparathyroidism of renal origin: Secondary | ICD-10-CM | POA: Diagnosis not present

## 2020-10-28 DIAGNOSIS — Z992 Dependence on renal dialysis: Secondary | ICD-10-CM | POA: Diagnosis not present

## 2020-10-28 DIAGNOSIS — D509 Iron deficiency anemia, unspecified: Secondary | ICD-10-CM | POA: Diagnosis not present

## 2020-10-30 DIAGNOSIS — Z992 Dependence on renal dialysis: Secondary | ICD-10-CM | POA: Diagnosis not present

## 2020-10-30 DIAGNOSIS — N186 End stage renal disease: Secondary | ICD-10-CM | POA: Diagnosis not present

## 2020-10-31 DIAGNOSIS — N186 End stage renal disease: Secondary | ICD-10-CM | POA: Diagnosis not present

## 2020-10-31 DIAGNOSIS — N2581 Secondary hyperparathyroidism of renal origin: Secondary | ICD-10-CM | POA: Diagnosis not present

## 2020-10-31 DIAGNOSIS — D509 Iron deficiency anemia, unspecified: Secondary | ICD-10-CM | POA: Diagnosis not present

## 2020-10-31 DIAGNOSIS — D631 Anemia in chronic kidney disease: Secondary | ICD-10-CM | POA: Diagnosis not present

## 2020-10-31 DIAGNOSIS — Z992 Dependence on renal dialysis: Secondary | ICD-10-CM | POA: Diagnosis not present

## 2020-11-01 DIAGNOSIS — I10 Essential (primary) hypertension: Secondary | ICD-10-CM | POA: Diagnosis not present

## 2020-11-01 DIAGNOSIS — N186 End stage renal disease: Secondary | ICD-10-CM | POA: Diagnosis not present

## 2020-11-01 DIAGNOSIS — Z992 Dependence on renal dialysis: Secondary | ICD-10-CM | POA: Diagnosis not present

## 2020-11-01 DIAGNOSIS — E1165 Type 2 diabetes mellitus with hyperglycemia: Secondary | ICD-10-CM | POA: Diagnosis not present

## 2020-11-01 DIAGNOSIS — Z299 Encounter for prophylactic measures, unspecified: Secondary | ICD-10-CM | POA: Diagnosis not present

## 2020-11-01 DIAGNOSIS — Z6821 Body mass index (BMI) 21.0-21.9, adult: Secondary | ICD-10-CM | POA: Diagnosis not present

## 2020-11-02 DIAGNOSIS — N186 End stage renal disease: Secondary | ICD-10-CM | POA: Diagnosis not present

## 2020-11-02 DIAGNOSIS — Z992 Dependence on renal dialysis: Secondary | ICD-10-CM | POA: Diagnosis not present

## 2020-11-02 DIAGNOSIS — N2581 Secondary hyperparathyroidism of renal origin: Secondary | ICD-10-CM | POA: Diagnosis not present

## 2020-11-02 DIAGNOSIS — D631 Anemia in chronic kidney disease: Secondary | ICD-10-CM | POA: Diagnosis not present

## 2020-11-02 DIAGNOSIS — D509 Iron deficiency anemia, unspecified: Secondary | ICD-10-CM | POA: Diagnosis not present

## 2020-11-04 DIAGNOSIS — D631 Anemia in chronic kidney disease: Secondary | ICD-10-CM | POA: Diagnosis not present

## 2020-11-04 DIAGNOSIS — N186 End stage renal disease: Secondary | ICD-10-CM | POA: Diagnosis not present

## 2020-11-04 DIAGNOSIS — N2581 Secondary hyperparathyroidism of renal origin: Secondary | ICD-10-CM | POA: Diagnosis not present

## 2020-11-04 DIAGNOSIS — D509 Iron deficiency anemia, unspecified: Secondary | ICD-10-CM | POA: Diagnosis not present

## 2020-11-04 DIAGNOSIS — Z992 Dependence on renal dialysis: Secondary | ICD-10-CM | POA: Diagnosis not present

## 2020-11-07 DIAGNOSIS — D631 Anemia in chronic kidney disease: Secondary | ICD-10-CM | POA: Diagnosis not present

## 2020-11-07 DIAGNOSIS — N2581 Secondary hyperparathyroidism of renal origin: Secondary | ICD-10-CM | POA: Diagnosis not present

## 2020-11-07 DIAGNOSIS — D509 Iron deficiency anemia, unspecified: Secondary | ICD-10-CM | POA: Diagnosis not present

## 2020-11-07 DIAGNOSIS — Z992 Dependence on renal dialysis: Secondary | ICD-10-CM | POA: Diagnosis not present

## 2020-11-07 DIAGNOSIS — N186 End stage renal disease: Secondary | ICD-10-CM | POA: Diagnosis not present

## 2020-11-09 DIAGNOSIS — D631 Anemia in chronic kidney disease: Secondary | ICD-10-CM | POA: Diagnosis not present

## 2020-11-09 DIAGNOSIS — N2581 Secondary hyperparathyroidism of renal origin: Secondary | ICD-10-CM | POA: Diagnosis not present

## 2020-11-09 DIAGNOSIS — N186 End stage renal disease: Secondary | ICD-10-CM | POA: Diagnosis not present

## 2020-11-09 DIAGNOSIS — D509 Iron deficiency anemia, unspecified: Secondary | ICD-10-CM | POA: Diagnosis not present

## 2020-11-09 DIAGNOSIS — Z992 Dependence on renal dialysis: Secondary | ICD-10-CM | POA: Diagnosis not present

## 2020-11-10 DIAGNOSIS — Z961 Presence of intraocular lens: Secondary | ICD-10-CM | POA: Diagnosis not present

## 2020-11-10 DIAGNOSIS — H43821 Vitreomacular adhesion, right eye: Secondary | ICD-10-CM | POA: Diagnosis not present

## 2020-11-10 DIAGNOSIS — E119 Type 2 diabetes mellitus without complications: Secondary | ICD-10-CM | POA: Diagnosis not present

## 2020-11-11 DIAGNOSIS — N186 End stage renal disease: Secondary | ICD-10-CM | POA: Diagnosis not present

## 2020-11-11 DIAGNOSIS — D631 Anemia in chronic kidney disease: Secondary | ICD-10-CM | POA: Diagnosis not present

## 2020-11-11 DIAGNOSIS — Z992 Dependence on renal dialysis: Secondary | ICD-10-CM | POA: Diagnosis not present

## 2020-11-11 DIAGNOSIS — D509 Iron deficiency anemia, unspecified: Secondary | ICD-10-CM | POA: Diagnosis not present

## 2020-11-11 DIAGNOSIS — N2581 Secondary hyperparathyroidism of renal origin: Secondary | ICD-10-CM | POA: Diagnosis not present

## 2020-11-14 DIAGNOSIS — N186 End stage renal disease: Secondary | ICD-10-CM | POA: Diagnosis not present

## 2020-11-14 DIAGNOSIS — N2581 Secondary hyperparathyroidism of renal origin: Secondary | ICD-10-CM | POA: Diagnosis not present

## 2020-11-14 DIAGNOSIS — Z992 Dependence on renal dialysis: Secondary | ICD-10-CM | POA: Diagnosis not present

## 2020-11-14 DIAGNOSIS — D631 Anemia in chronic kidney disease: Secondary | ICD-10-CM | POA: Diagnosis not present

## 2020-11-14 DIAGNOSIS — D509 Iron deficiency anemia, unspecified: Secondary | ICD-10-CM | POA: Diagnosis not present

## 2020-11-15 DIAGNOSIS — M2010 Hallux valgus (acquired), unspecified foot: Secondary | ICD-10-CM | POA: Diagnosis not present

## 2020-11-15 DIAGNOSIS — L97512 Non-pressure chronic ulcer of other part of right foot with fat layer exposed: Secondary | ICD-10-CM | POA: Diagnosis not present

## 2020-11-15 DIAGNOSIS — Z992 Dependence on renal dialysis: Secondary | ICD-10-CM | POA: Diagnosis not present

## 2020-11-15 DIAGNOSIS — M2040 Other hammer toe(s) (acquired), unspecified foot: Secondary | ICD-10-CM | POA: Diagnosis not present

## 2020-11-15 DIAGNOSIS — I739 Peripheral vascular disease, unspecified: Secondary | ICD-10-CM | POA: Diagnosis not present

## 2020-11-16 DIAGNOSIS — D509 Iron deficiency anemia, unspecified: Secondary | ICD-10-CM | POA: Diagnosis not present

## 2020-11-16 DIAGNOSIS — D631 Anemia in chronic kidney disease: Secondary | ICD-10-CM | POA: Diagnosis not present

## 2020-11-16 DIAGNOSIS — N2581 Secondary hyperparathyroidism of renal origin: Secondary | ICD-10-CM | POA: Diagnosis not present

## 2020-11-16 DIAGNOSIS — Z992 Dependence on renal dialysis: Secondary | ICD-10-CM | POA: Diagnosis not present

## 2020-11-16 DIAGNOSIS — N186 End stage renal disease: Secondary | ICD-10-CM | POA: Diagnosis not present

## 2020-11-18 DIAGNOSIS — D631 Anemia in chronic kidney disease: Secondary | ICD-10-CM | POA: Diagnosis not present

## 2020-11-18 DIAGNOSIS — Z992 Dependence on renal dialysis: Secondary | ICD-10-CM | POA: Diagnosis not present

## 2020-11-18 DIAGNOSIS — N2581 Secondary hyperparathyroidism of renal origin: Secondary | ICD-10-CM | POA: Diagnosis not present

## 2020-11-18 DIAGNOSIS — D509 Iron deficiency anemia, unspecified: Secondary | ICD-10-CM | POA: Diagnosis not present

## 2020-11-18 DIAGNOSIS — N186 End stage renal disease: Secondary | ICD-10-CM | POA: Diagnosis not present

## 2020-11-21 DIAGNOSIS — Z992 Dependence on renal dialysis: Secondary | ICD-10-CM | POA: Diagnosis not present

## 2020-11-21 DIAGNOSIS — D509 Iron deficiency anemia, unspecified: Secondary | ICD-10-CM | POA: Diagnosis not present

## 2020-11-21 DIAGNOSIS — N2581 Secondary hyperparathyroidism of renal origin: Secondary | ICD-10-CM | POA: Diagnosis not present

## 2020-11-21 DIAGNOSIS — N186 End stage renal disease: Secondary | ICD-10-CM | POA: Diagnosis not present

## 2020-11-21 DIAGNOSIS — D631 Anemia in chronic kidney disease: Secondary | ICD-10-CM | POA: Diagnosis not present

## 2020-11-23 DIAGNOSIS — Z992 Dependence on renal dialysis: Secondary | ICD-10-CM | POA: Diagnosis not present

## 2020-11-23 DIAGNOSIS — N186 End stage renal disease: Secondary | ICD-10-CM | POA: Diagnosis not present

## 2020-11-23 DIAGNOSIS — N2581 Secondary hyperparathyroidism of renal origin: Secondary | ICD-10-CM | POA: Diagnosis not present

## 2020-11-23 DIAGNOSIS — D631 Anemia in chronic kidney disease: Secondary | ICD-10-CM | POA: Diagnosis not present

## 2020-11-23 DIAGNOSIS — D509 Iron deficiency anemia, unspecified: Secondary | ICD-10-CM | POA: Diagnosis not present

## 2020-11-25 DIAGNOSIS — N186 End stage renal disease: Secondary | ICD-10-CM | POA: Diagnosis not present

## 2020-11-25 DIAGNOSIS — N2581 Secondary hyperparathyroidism of renal origin: Secondary | ICD-10-CM | POA: Diagnosis not present

## 2020-11-25 DIAGNOSIS — Z992 Dependence on renal dialysis: Secondary | ICD-10-CM | POA: Diagnosis not present

## 2020-11-25 DIAGNOSIS — D509 Iron deficiency anemia, unspecified: Secondary | ICD-10-CM | POA: Diagnosis not present

## 2020-11-25 DIAGNOSIS — D631 Anemia in chronic kidney disease: Secondary | ICD-10-CM | POA: Diagnosis not present

## 2020-11-28 DIAGNOSIS — N2581 Secondary hyperparathyroidism of renal origin: Secondary | ICD-10-CM | POA: Diagnosis not present

## 2020-11-28 DIAGNOSIS — Z992 Dependence on renal dialysis: Secondary | ICD-10-CM | POA: Diagnosis not present

## 2020-11-28 DIAGNOSIS — N186 End stage renal disease: Secondary | ICD-10-CM | POA: Diagnosis not present

## 2020-11-28 DIAGNOSIS — D631 Anemia in chronic kidney disease: Secondary | ICD-10-CM | POA: Diagnosis not present

## 2020-11-28 DIAGNOSIS — D509 Iron deficiency anemia, unspecified: Secondary | ICD-10-CM | POA: Diagnosis not present

## 2020-11-30 DIAGNOSIS — Z992 Dependence on renal dialysis: Secondary | ICD-10-CM | POA: Diagnosis not present

## 2020-11-30 DIAGNOSIS — N2581 Secondary hyperparathyroidism of renal origin: Secondary | ICD-10-CM | POA: Diagnosis not present

## 2020-11-30 DIAGNOSIS — N186 End stage renal disease: Secondary | ICD-10-CM | POA: Diagnosis not present

## 2020-11-30 DIAGNOSIS — D631 Anemia in chronic kidney disease: Secondary | ICD-10-CM | POA: Diagnosis not present

## 2020-11-30 DIAGNOSIS — D509 Iron deficiency anemia, unspecified: Secondary | ICD-10-CM | POA: Diagnosis not present

## 2020-12-02 DIAGNOSIS — Z992 Dependence on renal dialysis: Secondary | ICD-10-CM | POA: Diagnosis not present

## 2020-12-02 DIAGNOSIS — N2581 Secondary hyperparathyroidism of renal origin: Secondary | ICD-10-CM | POA: Diagnosis not present

## 2020-12-02 DIAGNOSIS — D509 Iron deficiency anemia, unspecified: Secondary | ICD-10-CM | POA: Diagnosis not present

## 2020-12-02 DIAGNOSIS — N186 End stage renal disease: Secondary | ICD-10-CM | POA: Diagnosis not present

## 2020-12-02 DIAGNOSIS — D631 Anemia in chronic kidney disease: Secondary | ICD-10-CM | POA: Diagnosis not present

## 2020-12-05 DIAGNOSIS — N186 End stage renal disease: Secondary | ICD-10-CM | POA: Diagnosis not present

## 2020-12-05 DIAGNOSIS — D509 Iron deficiency anemia, unspecified: Secondary | ICD-10-CM | POA: Diagnosis not present

## 2020-12-05 DIAGNOSIS — N2581 Secondary hyperparathyroidism of renal origin: Secondary | ICD-10-CM | POA: Diagnosis not present

## 2020-12-05 DIAGNOSIS — D631 Anemia in chronic kidney disease: Secondary | ICD-10-CM | POA: Diagnosis not present

## 2020-12-05 DIAGNOSIS — Z992 Dependence on renal dialysis: Secondary | ICD-10-CM | POA: Diagnosis not present

## 2020-12-07 DIAGNOSIS — N2581 Secondary hyperparathyroidism of renal origin: Secondary | ICD-10-CM | POA: Diagnosis not present

## 2020-12-07 DIAGNOSIS — Z992 Dependence on renal dialysis: Secondary | ICD-10-CM | POA: Diagnosis not present

## 2020-12-07 DIAGNOSIS — D631 Anemia in chronic kidney disease: Secondary | ICD-10-CM | POA: Diagnosis not present

## 2020-12-07 DIAGNOSIS — D509 Iron deficiency anemia, unspecified: Secondary | ICD-10-CM | POA: Diagnosis not present

## 2020-12-07 DIAGNOSIS — N186 End stage renal disease: Secondary | ICD-10-CM | POA: Diagnosis not present

## 2020-12-09 DIAGNOSIS — Z992 Dependence on renal dialysis: Secondary | ICD-10-CM | POA: Diagnosis not present

## 2020-12-09 DIAGNOSIS — N186 End stage renal disease: Secondary | ICD-10-CM | POA: Diagnosis not present

## 2020-12-09 DIAGNOSIS — D509 Iron deficiency anemia, unspecified: Secondary | ICD-10-CM | POA: Diagnosis not present

## 2020-12-09 DIAGNOSIS — N2581 Secondary hyperparathyroidism of renal origin: Secondary | ICD-10-CM | POA: Diagnosis not present

## 2020-12-09 DIAGNOSIS — D631 Anemia in chronic kidney disease: Secondary | ICD-10-CM | POA: Diagnosis not present

## 2020-12-12 DIAGNOSIS — N186 End stage renal disease: Secondary | ICD-10-CM | POA: Diagnosis not present

## 2020-12-12 DIAGNOSIS — Z992 Dependence on renal dialysis: Secondary | ICD-10-CM | POA: Diagnosis not present

## 2020-12-12 DIAGNOSIS — D631 Anemia in chronic kidney disease: Secondary | ICD-10-CM | POA: Diagnosis not present

## 2020-12-12 DIAGNOSIS — D509 Iron deficiency anemia, unspecified: Secondary | ICD-10-CM | POA: Diagnosis not present

## 2020-12-12 DIAGNOSIS — N2581 Secondary hyperparathyroidism of renal origin: Secondary | ICD-10-CM | POA: Diagnosis not present

## 2020-12-14 DIAGNOSIS — Z992 Dependence on renal dialysis: Secondary | ICD-10-CM | POA: Diagnosis not present

## 2020-12-14 DIAGNOSIS — N2581 Secondary hyperparathyroidism of renal origin: Secondary | ICD-10-CM | POA: Diagnosis not present

## 2020-12-14 DIAGNOSIS — N186 End stage renal disease: Secondary | ICD-10-CM | POA: Diagnosis not present

## 2020-12-14 DIAGNOSIS — D509 Iron deficiency anemia, unspecified: Secondary | ICD-10-CM | POA: Diagnosis not present

## 2020-12-14 DIAGNOSIS — D631 Anemia in chronic kidney disease: Secondary | ICD-10-CM | POA: Diagnosis not present

## 2020-12-16 DIAGNOSIS — D509 Iron deficiency anemia, unspecified: Secondary | ICD-10-CM | POA: Diagnosis not present

## 2020-12-16 DIAGNOSIS — Z992 Dependence on renal dialysis: Secondary | ICD-10-CM | POA: Diagnosis not present

## 2020-12-16 DIAGNOSIS — N186 End stage renal disease: Secondary | ICD-10-CM | POA: Diagnosis not present

## 2020-12-16 DIAGNOSIS — N2581 Secondary hyperparathyroidism of renal origin: Secondary | ICD-10-CM | POA: Diagnosis not present

## 2020-12-16 DIAGNOSIS — D631 Anemia in chronic kidney disease: Secondary | ICD-10-CM | POA: Diagnosis not present

## 2020-12-19 DIAGNOSIS — D509 Iron deficiency anemia, unspecified: Secondary | ICD-10-CM | POA: Diagnosis not present

## 2020-12-19 DIAGNOSIS — Z992 Dependence on renal dialysis: Secondary | ICD-10-CM | POA: Diagnosis not present

## 2020-12-19 DIAGNOSIS — D631 Anemia in chronic kidney disease: Secondary | ICD-10-CM | POA: Diagnosis not present

## 2020-12-19 DIAGNOSIS — N2581 Secondary hyperparathyroidism of renal origin: Secondary | ICD-10-CM | POA: Diagnosis not present

## 2020-12-19 DIAGNOSIS — N186 End stage renal disease: Secondary | ICD-10-CM | POA: Diagnosis not present

## 2020-12-21 DIAGNOSIS — N186 End stage renal disease: Secondary | ICD-10-CM | POA: Diagnosis not present

## 2020-12-21 DIAGNOSIS — D509 Iron deficiency anemia, unspecified: Secondary | ICD-10-CM | POA: Diagnosis not present

## 2020-12-21 DIAGNOSIS — D631 Anemia in chronic kidney disease: Secondary | ICD-10-CM | POA: Diagnosis not present

## 2020-12-21 DIAGNOSIS — N2581 Secondary hyperparathyroidism of renal origin: Secondary | ICD-10-CM | POA: Diagnosis not present

## 2020-12-21 DIAGNOSIS — Z992 Dependence on renal dialysis: Secondary | ICD-10-CM | POA: Diagnosis not present

## 2020-12-23 DIAGNOSIS — Z992 Dependence on renal dialysis: Secondary | ICD-10-CM | POA: Diagnosis not present

## 2020-12-23 DIAGNOSIS — N186 End stage renal disease: Secondary | ICD-10-CM | POA: Diagnosis not present

## 2020-12-23 DIAGNOSIS — D631 Anemia in chronic kidney disease: Secondary | ICD-10-CM | POA: Diagnosis not present

## 2020-12-23 DIAGNOSIS — D509 Iron deficiency anemia, unspecified: Secondary | ICD-10-CM | POA: Diagnosis not present

## 2020-12-23 DIAGNOSIS — N2581 Secondary hyperparathyroidism of renal origin: Secondary | ICD-10-CM | POA: Diagnosis not present

## 2020-12-26 DIAGNOSIS — Z992 Dependence on renal dialysis: Secondary | ICD-10-CM | POA: Diagnosis not present

## 2020-12-26 DIAGNOSIS — N2581 Secondary hyperparathyroidism of renal origin: Secondary | ICD-10-CM | POA: Diagnosis not present

## 2020-12-26 DIAGNOSIS — D631 Anemia in chronic kidney disease: Secondary | ICD-10-CM | POA: Diagnosis not present

## 2020-12-26 DIAGNOSIS — D509 Iron deficiency anemia, unspecified: Secondary | ICD-10-CM | POA: Diagnosis not present

## 2020-12-26 DIAGNOSIS — N186 End stage renal disease: Secondary | ICD-10-CM | POA: Diagnosis not present

## 2020-12-28 DIAGNOSIS — D631 Anemia in chronic kidney disease: Secondary | ICD-10-CM | POA: Diagnosis not present

## 2020-12-28 DIAGNOSIS — Z992 Dependence on renal dialysis: Secondary | ICD-10-CM | POA: Diagnosis not present

## 2020-12-28 DIAGNOSIS — N2581 Secondary hyperparathyroidism of renal origin: Secondary | ICD-10-CM | POA: Diagnosis not present

## 2020-12-28 DIAGNOSIS — N186 End stage renal disease: Secondary | ICD-10-CM | POA: Diagnosis not present

## 2020-12-28 DIAGNOSIS — D509 Iron deficiency anemia, unspecified: Secondary | ICD-10-CM | POA: Diagnosis not present

## 2020-12-30 DIAGNOSIS — Z992 Dependence on renal dialysis: Secondary | ICD-10-CM | POA: Diagnosis not present

## 2020-12-30 DIAGNOSIS — D631 Anemia in chronic kidney disease: Secondary | ICD-10-CM | POA: Diagnosis not present

## 2020-12-30 DIAGNOSIS — D509 Iron deficiency anemia, unspecified: Secondary | ICD-10-CM | POA: Diagnosis not present

## 2020-12-30 DIAGNOSIS — N2581 Secondary hyperparathyroidism of renal origin: Secondary | ICD-10-CM | POA: Diagnosis not present

## 2020-12-30 DIAGNOSIS — N186 End stage renal disease: Secondary | ICD-10-CM | POA: Diagnosis not present

## 2021-01-02 DIAGNOSIS — N186 End stage renal disease: Secondary | ICD-10-CM | POA: Diagnosis not present

## 2021-01-02 DIAGNOSIS — N2581 Secondary hyperparathyroidism of renal origin: Secondary | ICD-10-CM | POA: Diagnosis not present

## 2021-01-02 DIAGNOSIS — D509 Iron deficiency anemia, unspecified: Secondary | ICD-10-CM | POA: Diagnosis not present

## 2021-01-02 DIAGNOSIS — Z992 Dependence on renal dialysis: Secondary | ICD-10-CM | POA: Diagnosis not present

## 2021-01-02 DIAGNOSIS — Z23 Encounter for immunization: Secondary | ICD-10-CM | POA: Diagnosis not present

## 2021-01-02 DIAGNOSIS — D631 Anemia in chronic kidney disease: Secondary | ICD-10-CM | POA: Diagnosis not present

## 2021-01-04 DIAGNOSIS — Z992 Dependence on renal dialysis: Secondary | ICD-10-CM | POA: Diagnosis not present

## 2021-01-04 DIAGNOSIS — Z23 Encounter for immunization: Secondary | ICD-10-CM | POA: Diagnosis not present

## 2021-01-04 DIAGNOSIS — D631 Anemia in chronic kidney disease: Secondary | ICD-10-CM | POA: Diagnosis not present

## 2021-01-04 DIAGNOSIS — N186 End stage renal disease: Secondary | ICD-10-CM | POA: Diagnosis not present

## 2021-01-04 DIAGNOSIS — N2581 Secondary hyperparathyroidism of renal origin: Secondary | ICD-10-CM | POA: Diagnosis not present

## 2021-01-04 DIAGNOSIS — D509 Iron deficiency anemia, unspecified: Secondary | ICD-10-CM | POA: Diagnosis not present

## 2021-01-06 DIAGNOSIS — N186 End stage renal disease: Secondary | ICD-10-CM | POA: Diagnosis not present

## 2021-01-06 DIAGNOSIS — Z992 Dependence on renal dialysis: Secondary | ICD-10-CM | POA: Diagnosis not present

## 2021-01-06 DIAGNOSIS — D509 Iron deficiency anemia, unspecified: Secondary | ICD-10-CM | POA: Diagnosis not present

## 2021-01-06 DIAGNOSIS — D631 Anemia in chronic kidney disease: Secondary | ICD-10-CM | POA: Diagnosis not present

## 2021-01-06 DIAGNOSIS — Z23 Encounter for immunization: Secondary | ICD-10-CM | POA: Diagnosis not present

## 2021-01-06 DIAGNOSIS — N2581 Secondary hyperparathyroidism of renal origin: Secondary | ICD-10-CM | POA: Diagnosis not present

## 2021-01-09 DIAGNOSIS — D631 Anemia in chronic kidney disease: Secondary | ICD-10-CM | POA: Diagnosis not present

## 2021-01-09 DIAGNOSIS — N2581 Secondary hyperparathyroidism of renal origin: Secondary | ICD-10-CM | POA: Diagnosis not present

## 2021-01-09 DIAGNOSIS — N186 End stage renal disease: Secondary | ICD-10-CM | POA: Diagnosis not present

## 2021-01-09 DIAGNOSIS — Z23 Encounter for immunization: Secondary | ICD-10-CM | POA: Diagnosis not present

## 2021-01-09 DIAGNOSIS — D509 Iron deficiency anemia, unspecified: Secondary | ICD-10-CM | POA: Diagnosis not present

## 2021-01-09 DIAGNOSIS — Z992 Dependence on renal dialysis: Secondary | ICD-10-CM | POA: Diagnosis not present

## 2021-01-11 DIAGNOSIS — Z992 Dependence on renal dialysis: Secondary | ICD-10-CM | POA: Diagnosis not present

## 2021-01-11 DIAGNOSIS — N186 End stage renal disease: Secondary | ICD-10-CM | POA: Diagnosis not present

## 2021-01-11 DIAGNOSIS — Z23 Encounter for immunization: Secondary | ICD-10-CM | POA: Diagnosis not present

## 2021-01-11 DIAGNOSIS — D631 Anemia in chronic kidney disease: Secondary | ICD-10-CM | POA: Diagnosis not present

## 2021-01-11 DIAGNOSIS — D509 Iron deficiency anemia, unspecified: Secondary | ICD-10-CM | POA: Diagnosis not present

## 2021-01-11 DIAGNOSIS — N2581 Secondary hyperparathyroidism of renal origin: Secondary | ICD-10-CM | POA: Diagnosis not present

## 2021-01-13 DIAGNOSIS — N186 End stage renal disease: Secondary | ICD-10-CM | POA: Diagnosis not present

## 2021-01-13 DIAGNOSIS — D509 Iron deficiency anemia, unspecified: Secondary | ICD-10-CM | POA: Diagnosis not present

## 2021-01-13 DIAGNOSIS — Z23 Encounter for immunization: Secondary | ICD-10-CM | POA: Diagnosis not present

## 2021-01-13 DIAGNOSIS — N2581 Secondary hyperparathyroidism of renal origin: Secondary | ICD-10-CM | POA: Diagnosis not present

## 2021-01-13 DIAGNOSIS — D631 Anemia in chronic kidney disease: Secondary | ICD-10-CM | POA: Diagnosis not present

## 2021-01-13 DIAGNOSIS — Z992 Dependence on renal dialysis: Secondary | ICD-10-CM | POA: Diagnosis not present

## 2021-01-16 DIAGNOSIS — Z23 Encounter for immunization: Secondary | ICD-10-CM | POA: Diagnosis not present

## 2021-01-16 DIAGNOSIS — D509 Iron deficiency anemia, unspecified: Secondary | ICD-10-CM | POA: Diagnosis not present

## 2021-01-16 DIAGNOSIS — N2581 Secondary hyperparathyroidism of renal origin: Secondary | ICD-10-CM | POA: Diagnosis not present

## 2021-01-16 DIAGNOSIS — D631 Anemia in chronic kidney disease: Secondary | ICD-10-CM | POA: Diagnosis not present

## 2021-01-16 DIAGNOSIS — N186 End stage renal disease: Secondary | ICD-10-CM | POA: Diagnosis not present

## 2021-01-16 DIAGNOSIS — Z992 Dependence on renal dialysis: Secondary | ICD-10-CM | POA: Diagnosis not present

## 2021-01-18 DIAGNOSIS — N2581 Secondary hyperparathyroidism of renal origin: Secondary | ICD-10-CM | POA: Diagnosis not present

## 2021-01-18 DIAGNOSIS — D509 Iron deficiency anemia, unspecified: Secondary | ICD-10-CM | POA: Diagnosis not present

## 2021-01-18 DIAGNOSIS — N186 End stage renal disease: Secondary | ICD-10-CM | POA: Diagnosis not present

## 2021-01-18 DIAGNOSIS — Z992 Dependence on renal dialysis: Secondary | ICD-10-CM | POA: Diagnosis not present

## 2021-01-18 DIAGNOSIS — Z23 Encounter for immunization: Secondary | ICD-10-CM | POA: Diagnosis not present

## 2021-01-18 DIAGNOSIS — D631 Anemia in chronic kidney disease: Secondary | ICD-10-CM | POA: Diagnosis not present

## 2021-01-20 DIAGNOSIS — D631 Anemia in chronic kidney disease: Secondary | ICD-10-CM | POA: Diagnosis not present

## 2021-01-20 DIAGNOSIS — N2581 Secondary hyperparathyroidism of renal origin: Secondary | ICD-10-CM | POA: Diagnosis not present

## 2021-01-20 DIAGNOSIS — Z992 Dependence on renal dialysis: Secondary | ICD-10-CM | POA: Diagnosis not present

## 2021-01-20 DIAGNOSIS — N186 End stage renal disease: Secondary | ICD-10-CM | POA: Diagnosis not present

## 2021-01-20 DIAGNOSIS — D509 Iron deficiency anemia, unspecified: Secondary | ICD-10-CM | POA: Diagnosis not present

## 2021-01-20 DIAGNOSIS — Z23 Encounter for immunization: Secondary | ICD-10-CM | POA: Diagnosis not present

## 2021-01-23 DIAGNOSIS — D509 Iron deficiency anemia, unspecified: Secondary | ICD-10-CM | POA: Diagnosis not present

## 2021-01-23 DIAGNOSIS — D631 Anemia in chronic kidney disease: Secondary | ICD-10-CM | POA: Diagnosis not present

## 2021-01-23 DIAGNOSIS — Z992 Dependence on renal dialysis: Secondary | ICD-10-CM | POA: Diagnosis not present

## 2021-01-23 DIAGNOSIS — N186 End stage renal disease: Secondary | ICD-10-CM | POA: Diagnosis not present

## 2021-01-23 DIAGNOSIS — N2581 Secondary hyperparathyroidism of renal origin: Secondary | ICD-10-CM | POA: Diagnosis not present

## 2021-01-23 DIAGNOSIS — Z23 Encounter for immunization: Secondary | ICD-10-CM | POA: Diagnosis not present

## 2021-01-25 DIAGNOSIS — Z992 Dependence on renal dialysis: Secondary | ICD-10-CM | POA: Diagnosis not present

## 2021-01-25 DIAGNOSIS — Z23 Encounter for immunization: Secondary | ICD-10-CM | POA: Diagnosis not present

## 2021-01-25 DIAGNOSIS — N2581 Secondary hyperparathyroidism of renal origin: Secondary | ICD-10-CM | POA: Diagnosis not present

## 2021-01-25 DIAGNOSIS — D509 Iron deficiency anemia, unspecified: Secondary | ICD-10-CM | POA: Diagnosis not present

## 2021-01-25 DIAGNOSIS — N186 End stage renal disease: Secondary | ICD-10-CM | POA: Diagnosis not present

## 2021-01-25 DIAGNOSIS — D631 Anemia in chronic kidney disease: Secondary | ICD-10-CM | POA: Diagnosis not present

## 2021-01-27 DIAGNOSIS — D509 Iron deficiency anemia, unspecified: Secondary | ICD-10-CM | POA: Diagnosis not present

## 2021-01-27 DIAGNOSIS — N186 End stage renal disease: Secondary | ICD-10-CM | POA: Diagnosis not present

## 2021-01-27 DIAGNOSIS — Z23 Encounter for immunization: Secondary | ICD-10-CM | POA: Diagnosis not present

## 2021-01-27 DIAGNOSIS — Z992 Dependence on renal dialysis: Secondary | ICD-10-CM | POA: Diagnosis not present

## 2021-01-27 DIAGNOSIS — N2581 Secondary hyperparathyroidism of renal origin: Secondary | ICD-10-CM | POA: Diagnosis not present

## 2021-01-27 DIAGNOSIS — D631 Anemia in chronic kidney disease: Secondary | ICD-10-CM | POA: Diagnosis not present

## 2021-01-30 DIAGNOSIS — D631 Anemia in chronic kidney disease: Secondary | ICD-10-CM | POA: Diagnosis not present

## 2021-01-30 DIAGNOSIS — Z23 Encounter for immunization: Secondary | ICD-10-CM | POA: Diagnosis not present

## 2021-01-30 DIAGNOSIS — D509 Iron deficiency anemia, unspecified: Secondary | ICD-10-CM | POA: Diagnosis not present

## 2021-01-30 DIAGNOSIS — Z992 Dependence on renal dialysis: Secondary | ICD-10-CM | POA: Diagnosis not present

## 2021-01-30 DIAGNOSIS — N2581 Secondary hyperparathyroidism of renal origin: Secondary | ICD-10-CM | POA: Diagnosis not present

## 2021-01-30 DIAGNOSIS — N186 End stage renal disease: Secondary | ICD-10-CM | POA: Diagnosis not present

## 2021-02-01 DIAGNOSIS — D631 Anemia in chronic kidney disease: Secondary | ICD-10-CM | POA: Diagnosis not present

## 2021-02-01 DIAGNOSIS — N2581 Secondary hyperparathyroidism of renal origin: Secondary | ICD-10-CM | POA: Diagnosis not present

## 2021-02-01 DIAGNOSIS — D509 Iron deficiency anemia, unspecified: Secondary | ICD-10-CM | POA: Diagnosis not present

## 2021-02-01 DIAGNOSIS — Z992 Dependence on renal dialysis: Secondary | ICD-10-CM | POA: Diagnosis not present

## 2021-02-01 DIAGNOSIS — N186 End stage renal disease: Secondary | ICD-10-CM | POA: Diagnosis not present

## 2021-02-03 DIAGNOSIS — D631 Anemia in chronic kidney disease: Secondary | ICD-10-CM | POA: Diagnosis not present

## 2021-02-03 DIAGNOSIS — N2581 Secondary hyperparathyroidism of renal origin: Secondary | ICD-10-CM | POA: Diagnosis not present

## 2021-02-03 DIAGNOSIS — Z992 Dependence on renal dialysis: Secondary | ICD-10-CM | POA: Diagnosis not present

## 2021-02-03 DIAGNOSIS — N186 End stage renal disease: Secondary | ICD-10-CM | POA: Diagnosis not present

## 2021-02-03 DIAGNOSIS — D509 Iron deficiency anemia, unspecified: Secondary | ICD-10-CM | POA: Diagnosis not present

## 2021-02-06 DIAGNOSIS — N186 End stage renal disease: Secondary | ICD-10-CM | POA: Diagnosis not present

## 2021-02-06 DIAGNOSIS — D631 Anemia in chronic kidney disease: Secondary | ICD-10-CM | POA: Diagnosis not present

## 2021-02-06 DIAGNOSIS — D509 Iron deficiency anemia, unspecified: Secondary | ICD-10-CM | POA: Diagnosis not present

## 2021-02-06 DIAGNOSIS — N2581 Secondary hyperparathyroidism of renal origin: Secondary | ICD-10-CM | POA: Diagnosis not present

## 2021-02-06 DIAGNOSIS — Z992 Dependence on renal dialysis: Secondary | ICD-10-CM | POA: Diagnosis not present

## 2021-02-07 DIAGNOSIS — I10 Essential (primary) hypertension: Secondary | ICD-10-CM | POA: Diagnosis not present

## 2021-02-07 DIAGNOSIS — Z299 Encounter for prophylactic measures, unspecified: Secondary | ICD-10-CM | POA: Diagnosis not present

## 2021-02-07 DIAGNOSIS — E1165 Type 2 diabetes mellitus with hyperglycemia: Secondary | ICD-10-CM | POA: Diagnosis not present

## 2021-02-07 DIAGNOSIS — E1122 Type 2 diabetes mellitus with diabetic chronic kidney disease: Secondary | ICD-10-CM | POA: Diagnosis not present

## 2021-02-07 DIAGNOSIS — N186 End stage renal disease: Secondary | ICD-10-CM | POA: Diagnosis not present

## 2021-02-08 DIAGNOSIS — D631 Anemia in chronic kidney disease: Secondary | ICD-10-CM | POA: Diagnosis not present

## 2021-02-08 DIAGNOSIS — Z992 Dependence on renal dialysis: Secondary | ICD-10-CM | POA: Diagnosis not present

## 2021-02-08 DIAGNOSIS — N2581 Secondary hyperparathyroidism of renal origin: Secondary | ICD-10-CM | POA: Diagnosis not present

## 2021-02-08 DIAGNOSIS — N186 End stage renal disease: Secondary | ICD-10-CM | POA: Diagnosis not present

## 2021-02-08 DIAGNOSIS — D509 Iron deficiency anemia, unspecified: Secondary | ICD-10-CM | POA: Diagnosis not present

## 2021-02-10 DIAGNOSIS — D509 Iron deficiency anemia, unspecified: Secondary | ICD-10-CM | POA: Diagnosis not present

## 2021-02-10 DIAGNOSIS — Z992 Dependence on renal dialysis: Secondary | ICD-10-CM | POA: Diagnosis not present

## 2021-02-10 DIAGNOSIS — D631 Anemia in chronic kidney disease: Secondary | ICD-10-CM | POA: Diagnosis not present

## 2021-02-10 DIAGNOSIS — N2581 Secondary hyperparathyroidism of renal origin: Secondary | ICD-10-CM | POA: Diagnosis not present

## 2021-02-10 DIAGNOSIS — N186 End stage renal disease: Secondary | ICD-10-CM | POA: Diagnosis not present

## 2021-02-13 DIAGNOSIS — D631 Anemia in chronic kidney disease: Secondary | ICD-10-CM | POA: Diagnosis not present

## 2021-02-13 DIAGNOSIS — N2581 Secondary hyperparathyroidism of renal origin: Secondary | ICD-10-CM | POA: Diagnosis not present

## 2021-02-13 DIAGNOSIS — N186 End stage renal disease: Secondary | ICD-10-CM | POA: Diagnosis not present

## 2021-02-13 DIAGNOSIS — Z992 Dependence on renal dialysis: Secondary | ICD-10-CM | POA: Diagnosis not present

## 2021-02-13 DIAGNOSIS — D509 Iron deficiency anemia, unspecified: Secondary | ICD-10-CM | POA: Diagnosis not present

## 2021-02-15 DIAGNOSIS — D631 Anemia in chronic kidney disease: Secondary | ICD-10-CM | POA: Diagnosis not present

## 2021-02-15 DIAGNOSIS — N2581 Secondary hyperparathyroidism of renal origin: Secondary | ICD-10-CM | POA: Diagnosis not present

## 2021-02-15 DIAGNOSIS — N186 End stage renal disease: Secondary | ICD-10-CM | POA: Diagnosis not present

## 2021-02-15 DIAGNOSIS — Z992 Dependence on renal dialysis: Secondary | ICD-10-CM | POA: Diagnosis not present

## 2021-02-15 DIAGNOSIS — D509 Iron deficiency anemia, unspecified: Secondary | ICD-10-CM | POA: Diagnosis not present

## 2021-02-16 DIAGNOSIS — E1140 Type 2 diabetes mellitus with diabetic neuropathy, unspecified: Secondary | ICD-10-CM | POA: Diagnosis not present

## 2021-02-16 DIAGNOSIS — Z992 Dependence on renal dialysis: Secondary | ICD-10-CM | POA: Diagnosis not present

## 2021-02-16 DIAGNOSIS — Z89421 Acquired absence of other right toe(s): Secondary | ICD-10-CM | POA: Diagnosis not present

## 2021-02-16 DIAGNOSIS — I739 Peripheral vascular disease, unspecified: Secondary | ICD-10-CM | POA: Diagnosis not present

## 2021-02-17 DIAGNOSIS — N186 End stage renal disease: Secondary | ICD-10-CM | POA: Diagnosis not present

## 2021-02-17 DIAGNOSIS — Z992 Dependence on renal dialysis: Secondary | ICD-10-CM | POA: Diagnosis not present

## 2021-02-17 DIAGNOSIS — D509 Iron deficiency anemia, unspecified: Secondary | ICD-10-CM | POA: Diagnosis not present

## 2021-02-17 DIAGNOSIS — N2581 Secondary hyperparathyroidism of renal origin: Secondary | ICD-10-CM | POA: Diagnosis not present

## 2021-02-17 DIAGNOSIS — D631 Anemia in chronic kidney disease: Secondary | ICD-10-CM | POA: Diagnosis not present

## 2021-02-20 DIAGNOSIS — N2581 Secondary hyperparathyroidism of renal origin: Secondary | ICD-10-CM | POA: Diagnosis not present

## 2021-02-20 DIAGNOSIS — D631 Anemia in chronic kidney disease: Secondary | ICD-10-CM | POA: Diagnosis not present

## 2021-02-20 DIAGNOSIS — Z992 Dependence on renal dialysis: Secondary | ICD-10-CM | POA: Diagnosis not present

## 2021-02-20 DIAGNOSIS — N186 End stage renal disease: Secondary | ICD-10-CM | POA: Diagnosis not present

## 2021-02-20 DIAGNOSIS — D509 Iron deficiency anemia, unspecified: Secondary | ICD-10-CM | POA: Diagnosis not present

## 2021-02-22 DIAGNOSIS — D631 Anemia in chronic kidney disease: Secondary | ICD-10-CM | POA: Diagnosis not present

## 2021-02-22 DIAGNOSIS — D509 Iron deficiency anemia, unspecified: Secondary | ICD-10-CM | POA: Diagnosis not present

## 2021-02-22 DIAGNOSIS — N2581 Secondary hyperparathyroidism of renal origin: Secondary | ICD-10-CM | POA: Diagnosis not present

## 2021-02-22 DIAGNOSIS — Z992 Dependence on renal dialysis: Secondary | ICD-10-CM | POA: Diagnosis not present

## 2021-02-22 DIAGNOSIS — N186 End stage renal disease: Secondary | ICD-10-CM | POA: Diagnosis not present

## 2021-02-24 DIAGNOSIS — D631 Anemia in chronic kidney disease: Secondary | ICD-10-CM | POA: Diagnosis not present

## 2021-02-24 DIAGNOSIS — N186 End stage renal disease: Secondary | ICD-10-CM | POA: Diagnosis not present

## 2021-02-24 DIAGNOSIS — D509 Iron deficiency anemia, unspecified: Secondary | ICD-10-CM | POA: Diagnosis not present

## 2021-02-24 DIAGNOSIS — Z992 Dependence on renal dialysis: Secondary | ICD-10-CM | POA: Diagnosis not present

## 2021-02-24 DIAGNOSIS — N2581 Secondary hyperparathyroidism of renal origin: Secondary | ICD-10-CM | POA: Diagnosis not present

## 2021-02-27 DIAGNOSIS — D631 Anemia in chronic kidney disease: Secondary | ICD-10-CM | POA: Diagnosis not present

## 2021-02-27 DIAGNOSIS — Z992 Dependence on renal dialysis: Secondary | ICD-10-CM | POA: Diagnosis not present

## 2021-02-27 DIAGNOSIS — N2581 Secondary hyperparathyroidism of renal origin: Secondary | ICD-10-CM | POA: Diagnosis not present

## 2021-02-27 DIAGNOSIS — N186 End stage renal disease: Secondary | ICD-10-CM | POA: Diagnosis not present

## 2021-02-27 DIAGNOSIS — D509 Iron deficiency anemia, unspecified: Secondary | ICD-10-CM | POA: Diagnosis not present

## 2021-03-01 DIAGNOSIS — N2581 Secondary hyperparathyroidism of renal origin: Secondary | ICD-10-CM | POA: Diagnosis not present

## 2021-03-01 DIAGNOSIS — D509 Iron deficiency anemia, unspecified: Secondary | ICD-10-CM | POA: Diagnosis not present

## 2021-03-01 DIAGNOSIS — D631 Anemia in chronic kidney disease: Secondary | ICD-10-CM | POA: Diagnosis not present

## 2021-03-01 DIAGNOSIS — Z992 Dependence on renal dialysis: Secondary | ICD-10-CM | POA: Diagnosis not present

## 2021-03-01 DIAGNOSIS — N186 End stage renal disease: Secondary | ICD-10-CM | POA: Diagnosis not present

## 2021-03-03 DIAGNOSIS — D509 Iron deficiency anemia, unspecified: Secondary | ICD-10-CM | POA: Diagnosis not present

## 2021-03-03 DIAGNOSIS — Z992 Dependence on renal dialysis: Secondary | ICD-10-CM | POA: Diagnosis not present

## 2021-03-03 DIAGNOSIS — N2581 Secondary hyperparathyroidism of renal origin: Secondary | ICD-10-CM | POA: Diagnosis not present

## 2021-03-03 DIAGNOSIS — N186 End stage renal disease: Secondary | ICD-10-CM | POA: Diagnosis not present

## 2021-03-03 DIAGNOSIS — D631 Anemia in chronic kidney disease: Secondary | ICD-10-CM | POA: Diagnosis not present

## 2021-03-06 DIAGNOSIS — N186 End stage renal disease: Secondary | ICD-10-CM | POA: Diagnosis not present

## 2021-03-06 DIAGNOSIS — Z992 Dependence on renal dialysis: Secondary | ICD-10-CM | POA: Diagnosis not present

## 2021-03-06 DIAGNOSIS — D631 Anemia in chronic kidney disease: Secondary | ICD-10-CM | POA: Diagnosis not present

## 2021-03-06 DIAGNOSIS — N2581 Secondary hyperparathyroidism of renal origin: Secondary | ICD-10-CM | POA: Diagnosis not present

## 2021-03-06 DIAGNOSIS — D509 Iron deficiency anemia, unspecified: Secondary | ICD-10-CM | POA: Diagnosis not present

## 2021-03-07 DIAGNOSIS — E1122 Type 2 diabetes mellitus with diabetic chronic kidney disease: Secondary | ICD-10-CM | POA: Diagnosis not present

## 2021-03-07 DIAGNOSIS — M79622 Pain in left upper arm: Secondary | ICD-10-CM | POA: Diagnosis not present

## 2021-03-07 DIAGNOSIS — R110 Nausea: Secondary | ICD-10-CM | POA: Diagnosis not present

## 2021-03-07 DIAGNOSIS — T82898A Other specified complication of vascular prosthetic devices, implants and grafts, initial encounter: Secondary | ICD-10-CM | POA: Diagnosis not present

## 2021-03-07 DIAGNOSIS — T82858A Stenosis of vascular prosthetic devices, implants and grafts, initial encounter: Secondary | ICD-10-CM | POA: Diagnosis not present

## 2021-03-07 DIAGNOSIS — R52 Pain, unspecified: Secondary | ICD-10-CM | POA: Diagnosis not present

## 2021-03-07 DIAGNOSIS — Z79899 Other long term (current) drug therapy: Secondary | ICD-10-CM | POA: Diagnosis not present

## 2021-03-08 DIAGNOSIS — N186 End stage renal disease: Secondary | ICD-10-CM | POA: Diagnosis not present

## 2021-03-08 DIAGNOSIS — D631 Anemia in chronic kidney disease: Secondary | ICD-10-CM | POA: Diagnosis not present

## 2021-03-08 DIAGNOSIS — Z992 Dependence on renal dialysis: Secondary | ICD-10-CM | POA: Diagnosis not present

## 2021-03-08 DIAGNOSIS — N2581 Secondary hyperparathyroidism of renal origin: Secondary | ICD-10-CM | POA: Diagnosis not present

## 2021-03-08 DIAGNOSIS — D509 Iron deficiency anemia, unspecified: Secondary | ICD-10-CM | POA: Diagnosis not present

## 2021-03-10 DIAGNOSIS — N186 End stage renal disease: Secondary | ICD-10-CM | POA: Diagnosis not present

## 2021-03-10 DIAGNOSIS — Z992 Dependence on renal dialysis: Secondary | ICD-10-CM | POA: Diagnosis not present

## 2021-03-10 DIAGNOSIS — D631 Anemia in chronic kidney disease: Secondary | ICD-10-CM | POA: Diagnosis not present

## 2021-03-10 DIAGNOSIS — D509 Iron deficiency anemia, unspecified: Secondary | ICD-10-CM | POA: Diagnosis not present

## 2021-03-10 DIAGNOSIS — N2581 Secondary hyperparathyroidism of renal origin: Secondary | ICD-10-CM | POA: Diagnosis not present

## 2021-03-13 DIAGNOSIS — Z992 Dependence on renal dialysis: Secondary | ICD-10-CM | POA: Diagnosis not present

## 2021-03-13 DIAGNOSIS — D631 Anemia in chronic kidney disease: Secondary | ICD-10-CM | POA: Diagnosis not present

## 2021-03-13 DIAGNOSIS — D509 Iron deficiency anemia, unspecified: Secondary | ICD-10-CM | POA: Diagnosis not present

## 2021-03-13 DIAGNOSIS — N186 End stage renal disease: Secondary | ICD-10-CM | POA: Diagnosis not present

## 2021-03-13 DIAGNOSIS — N2581 Secondary hyperparathyroidism of renal origin: Secondary | ICD-10-CM | POA: Diagnosis not present

## 2021-03-15 DIAGNOSIS — D631 Anemia in chronic kidney disease: Secondary | ICD-10-CM | POA: Diagnosis not present

## 2021-03-15 DIAGNOSIS — D509 Iron deficiency anemia, unspecified: Secondary | ICD-10-CM | POA: Diagnosis not present

## 2021-03-15 DIAGNOSIS — N186 End stage renal disease: Secondary | ICD-10-CM | POA: Diagnosis not present

## 2021-03-15 DIAGNOSIS — Z992 Dependence on renal dialysis: Secondary | ICD-10-CM | POA: Diagnosis not present

## 2021-03-15 DIAGNOSIS — N2581 Secondary hyperparathyroidism of renal origin: Secondary | ICD-10-CM | POA: Diagnosis not present

## 2021-03-17 DIAGNOSIS — D631 Anemia in chronic kidney disease: Secondary | ICD-10-CM | POA: Diagnosis not present

## 2021-03-17 DIAGNOSIS — N2581 Secondary hyperparathyroidism of renal origin: Secondary | ICD-10-CM | POA: Diagnosis not present

## 2021-03-17 DIAGNOSIS — D509 Iron deficiency anemia, unspecified: Secondary | ICD-10-CM | POA: Diagnosis not present

## 2021-03-17 DIAGNOSIS — N186 End stage renal disease: Secondary | ICD-10-CM | POA: Diagnosis not present

## 2021-03-17 DIAGNOSIS — Z992 Dependence on renal dialysis: Secondary | ICD-10-CM | POA: Diagnosis not present

## 2021-03-20 DIAGNOSIS — D631 Anemia in chronic kidney disease: Secondary | ICD-10-CM | POA: Diagnosis not present

## 2021-03-20 DIAGNOSIS — Z992 Dependence on renal dialysis: Secondary | ICD-10-CM | POA: Diagnosis not present

## 2021-03-20 DIAGNOSIS — N186 End stage renal disease: Secondary | ICD-10-CM | POA: Diagnosis not present

## 2021-03-20 DIAGNOSIS — N2581 Secondary hyperparathyroidism of renal origin: Secondary | ICD-10-CM | POA: Diagnosis not present

## 2021-03-20 DIAGNOSIS — D509 Iron deficiency anemia, unspecified: Secondary | ICD-10-CM | POA: Diagnosis not present

## 2021-03-22 DIAGNOSIS — Z992 Dependence on renal dialysis: Secondary | ICD-10-CM | POA: Diagnosis not present

## 2021-03-22 DIAGNOSIS — N2581 Secondary hyperparathyroidism of renal origin: Secondary | ICD-10-CM | POA: Diagnosis not present

## 2021-03-22 DIAGNOSIS — D509 Iron deficiency anemia, unspecified: Secondary | ICD-10-CM | POA: Diagnosis not present

## 2021-03-22 DIAGNOSIS — D631 Anemia in chronic kidney disease: Secondary | ICD-10-CM | POA: Diagnosis not present

## 2021-03-22 DIAGNOSIS — N186 End stage renal disease: Secondary | ICD-10-CM | POA: Diagnosis not present

## 2021-03-24 DIAGNOSIS — N2581 Secondary hyperparathyroidism of renal origin: Secondary | ICD-10-CM | POA: Diagnosis not present

## 2021-03-24 DIAGNOSIS — Z992 Dependence on renal dialysis: Secondary | ICD-10-CM | POA: Diagnosis not present

## 2021-03-24 DIAGNOSIS — D631 Anemia in chronic kidney disease: Secondary | ICD-10-CM | POA: Diagnosis not present

## 2021-03-24 DIAGNOSIS — N186 End stage renal disease: Secondary | ICD-10-CM | POA: Diagnosis not present

## 2021-03-24 DIAGNOSIS — D509 Iron deficiency anemia, unspecified: Secondary | ICD-10-CM | POA: Diagnosis not present

## 2021-03-29 DIAGNOSIS — N186 End stage renal disease: Secondary | ICD-10-CM | POA: Diagnosis not present

## 2021-03-29 DIAGNOSIS — Z992 Dependence on renal dialysis: Secondary | ICD-10-CM | POA: Diagnosis not present

## 2021-03-29 DIAGNOSIS — N2581 Secondary hyperparathyroidism of renal origin: Secondary | ICD-10-CM | POA: Diagnosis not present

## 2021-03-29 DIAGNOSIS — D509 Iron deficiency anemia, unspecified: Secondary | ICD-10-CM | POA: Diagnosis not present

## 2021-03-29 DIAGNOSIS — D631 Anemia in chronic kidney disease: Secondary | ICD-10-CM | POA: Diagnosis not present

## 2021-03-31 DIAGNOSIS — N2581 Secondary hyperparathyroidism of renal origin: Secondary | ICD-10-CM | POA: Diagnosis not present

## 2021-03-31 DIAGNOSIS — D631 Anemia in chronic kidney disease: Secondary | ICD-10-CM | POA: Diagnosis not present

## 2021-03-31 DIAGNOSIS — Z992 Dependence on renal dialysis: Secondary | ICD-10-CM | POA: Diagnosis not present

## 2021-03-31 DIAGNOSIS — N186 End stage renal disease: Secondary | ICD-10-CM | POA: Diagnosis not present

## 2021-03-31 DIAGNOSIS — D509 Iron deficiency anemia, unspecified: Secondary | ICD-10-CM | POA: Diagnosis not present

## 2021-04-01 DIAGNOSIS — N186 End stage renal disease: Secondary | ICD-10-CM | POA: Diagnosis not present

## 2021-04-01 DIAGNOSIS — Z992 Dependence on renal dialysis: Secondary | ICD-10-CM | POA: Diagnosis not present

## 2021-04-03 DIAGNOSIS — D509 Iron deficiency anemia, unspecified: Secondary | ICD-10-CM | POA: Diagnosis not present

## 2021-04-03 DIAGNOSIS — Z992 Dependence on renal dialysis: Secondary | ICD-10-CM | POA: Diagnosis not present

## 2021-04-03 DIAGNOSIS — N186 End stage renal disease: Secondary | ICD-10-CM | POA: Diagnosis not present

## 2021-04-03 DIAGNOSIS — D631 Anemia in chronic kidney disease: Secondary | ICD-10-CM | POA: Diagnosis not present

## 2021-04-03 DIAGNOSIS — N2581 Secondary hyperparathyroidism of renal origin: Secondary | ICD-10-CM | POA: Diagnosis not present

## 2021-04-05 DIAGNOSIS — N186 End stage renal disease: Secondary | ICD-10-CM | POA: Diagnosis not present

## 2021-04-05 DIAGNOSIS — D509 Iron deficiency anemia, unspecified: Secondary | ICD-10-CM | POA: Diagnosis not present

## 2021-04-05 DIAGNOSIS — N2581 Secondary hyperparathyroidism of renal origin: Secondary | ICD-10-CM | POA: Diagnosis not present

## 2021-04-05 DIAGNOSIS — D631 Anemia in chronic kidney disease: Secondary | ICD-10-CM | POA: Diagnosis not present

## 2021-04-05 DIAGNOSIS — Z992 Dependence on renal dialysis: Secondary | ICD-10-CM | POA: Diagnosis not present

## 2021-04-07 DIAGNOSIS — N186 End stage renal disease: Secondary | ICD-10-CM | POA: Diagnosis not present

## 2021-04-07 DIAGNOSIS — D509 Iron deficiency anemia, unspecified: Secondary | ICD-10-CM | POA: Diagnosis not present

## 2021-04-07 DIAGNOSIS — D631 Anemia in chronic kidney disease: Secondary | ICD-10-CM | POA: Diagnosis not present

## 2021-04-07 DIAGNOSIS — N2581 Secondary hyperparathyroidism of renal origin: Secondary | ICD-10-CM | POA: Diagnosis not present

## 2021-04-07 DIAGNOSIS — Z992 Dependence on renal dialysis: Secondary | ICD-10-CM | POA: Diagnosis not present

## 2021-04-10 DIAGNOSIS — D631 Anemia in chronic kidney disease: Secondary | ICD-10-CM | POA: Diagnosis not present

## 2021-04-10 DIAGNOSIS — D509 Iron deficiency anemia, unspecified: Secondary | ICD-10-CM | POA: Diagnosis not present

## 2021-04-10 DIAGNOSIS — N186 End stage renal disease: Secondary | ICD-10-CM | POA: Diagnosis not present

## 2021-04-10 DIAGNOSIS — N2581 Secondary hyperparathyroidism of renal origin: Secondary | ICD-10-CM | POA: Diagnosis not present

## 2021-04-10 DIAGNOSIS — Z992 Dependence on renal dialysis: Secondary | ICD-10-CM | POA: Diagnosis not present

## 2021-04-12 DIAGNOSIS — D631 Anemia in chronic kidney disease: Secondary | ICD-10-CM | POA: Diagnosis not present

## 2021-04-12 DIAGNOSIS — D509 Iron deficiency anemia, unspecified: Secondary | ICD-10-CM | POA: Diagnosis not present

## 2021-04-12 DIAGNOSIS — N186 End stage renal disease: Secondary | ICD-10-CM | POA: Diagnosis not present

## 2021-04-12 DIAGNOSIS — Z992 Dependence on renal dialysis: Secondary | ICD-10-CM | POA: Diagnosis not present

## 2021-04-12 DIAGNOSIS — N2581 Secondary hyperparathyroidism of renal origin: Secondary | ICD-10-CM | POA: Diagnosis not present

## 2021-04-14 DIAGNOSIS — Z992 Dependence on renal dialysis: Secondary | ICD-10-CM | POA: Diagnosis not present

## 2021-04-14 DIAGNOSIS — N186 End stage renal disease: Secondary | ICD-10-CM | POA: Diagnosis not present

## 2021-04-14 DIAGNOSIS — D631 Anemia in chronic kidney disease: Secondary | ICD-10-CM | POA: Diagnosis not present

## 2021-04-14 DIAGNOSIS — D509 Iron deficiency anemia, unspecified: Secondary | ICD-10-CM | POA: Diagnosis not present

## 2021-04-14 DIAGNOSIS — N2581 Secondary hyperparathyroidism of renal origin: Secondary | ICD-10-CM | POA: Diagnosis not present

## 2021-04-17 DIAGNOSIS — N186 End stage renal disease: Secondary | ICD-10-CM | POA: Diagnosis not present

## 2021-04-17 DIAGNOSIS — Z992 Dependence on renal dialysis: Secondary | ICD-10-CM | POA: Diagnosis not present

## 2021-04-17 DIAGNOSIS — N2581 Secondary hyperparathyroidism of renal origin: Secondary | ICD-10-CM | POA: Diagnosis not present

## 2021-04-17 DIAGNOSIS — D631 Anemia in chronic kidney disease: Secondary | ICD-10-CM | POA: Diagnosis not present

## 2021-04-17 DIAGNOSIS — D509 Iron deficiency anemia, unspecified: Secondary | ICD-10-CM | POA: Diagnosis not present

## 2021-04-19 DIAGNOSIS — N2581 Secondary hyperparathyroidism of renal origin: Secondary | ICD-10-CM | POA: Diagnosis not present

## 2021-04-19 DIAGNOSIS — Z992 Dependence on renal dialysis: Secondary | ICD-10-CM | POA: Diagnosis not present

## 2021-04-19 DIAGNOSIS — D509 Iron deficiency anemia, unspecified: Secondary | ICD-10-CM | POA: Diagnosis not present

## 2021-04-19 DIAGNOSIS — N186 End stage renal disease: Secondary | ICD-10-CM | POA: Diagnosis not present

## 2021-04-19 DIAGNOSIS — D631 Anemia in chronic kidney disease: Secondary | ICD-10-CM | POA: Diagnosis not present

## 2021-04-21 DIAGNOSIS — Z992 Dependence on renal dialysis: Secondary | ICD-10-CM | POA: Diagnosis not present

## 2021-04-21 DIAGNOSIS — N2581 Secondary hyperparathyroidism of renal origin: Secondary | ICD-10-CM | POA: Diagnosis not present

## 2021-04-21 DIAGNOSIS — D631 Anemia in chronic kidney disease: Secondary | ICD-10-CM | POA: Diagnosis not present

## 2021-04-21 DIAGNOSIS — D509 Iron deficiency anemia, unspecified: Secondary | ICD-10-CM | POA: Diagnosis not present

## 2021-04-21 DIAGNOSIS — N186 End stage renal disease: Secondary | ICD-10-CM | POA: Diagnosis not present

## 2021-04-24 DIAGNOSIS — D509 Iron deficiency anemia, unspecified: Secondary | ICD-10-CM | POA: Diagnosis not present

## 2021-04-24 DIAGNOSIS — D631 Anemia in chronic kidney disease: Secondary | ICD-10-CM | POA: Diagnosis not present

## 2021-04-24 DIAGNOSIS — Z992 Dependence on renal dialysis: Secondary | ICD-10-CM | POA: Diagnosis not present

## 2021-04-24 DIAGNOSIS — N186 End stage renal disease: Secondary | ICD-10-CM | POA: Diagnosis not present

## 2021-04-24 DIAGNOSIS — N2581 Secondary hyperparathyroidism of renal origin: Secondary | ICD-10-CM | POA: Diagnosis not present

## 2021-04-26 DIAGNOSIS — Z992 Dependence on renal dialysis: Secondary | ICD-10-CM | POA: Diagnosis not present

## 2021-04-26 DIAGNOSIS — N2581 Secondary hyperparathyroidism of renal origin: Secondary | ICD-10-CM | POA: Diagnosis not present

## 2021-04-26 DIAGNOSIS — N186 End stage renal disease: Secondary | ICD-10-CM | POA: Diagnosis not present

## 2021-04-26 DIAGNOSIS — D509 Iron deficiency anemia, unspecified: Secondary | ICD-10-CM | POA: Diagnosis not present

## 2021-04-26 DIAGNOSIS — D631 Anemia in chronic kidney disease: Secondary | ICD-10-CM | POA: Diagnosis not present

## 2021-04-28 DIAGNOSIS — D631 Anemia in chronic kidney disease: Secondary | ICD-10-CM | POA: Diagnosis not present

## 2021-04-28 DIAGNOSIS — D509 Iron deficiency anemia, unspecified: Secondary | ICD-10-CM | POA: Diagnosis not present

## 2021-04-28 DIAGNOSIS — Z992 Dependence on renal dialysis: Secondary | ICD-10-CM | POA: Diagnosis not present

## 2021-04-28 DIAGNOSIS — N2581 Secondary hyperparathyroidism of renal origin: Secondary | ICD-10-CM | POA: Diagnosis not present

## 2021-04-28 DIAGNOSIS — N186 End stage renal disease: Secondary | ICD-10-CM | POA: Diagnosis not present

## 2021-05-01 DIAGNOSIS — N2581 Secondary hyperparathyroidism of renal origin: Secondary | ICD-10-CM | POA: Diagnosis not present

## 2021-05-01 DIAGNOSIS — E1129 Type 2 diabetes mellitus with other diabetic kidney complication: Secondary | ICD-10-CM | POA: Diagnosis not present

## 2021-05-01 DIAGNOSIS — Z992 Dependence on renal dialysis: Secondary | ICD-10-CM | POA: Diagnosis not present

## 2021-05-01 DIAGNOSIS — N186 End stage renal disease: Secondary | ICD-10-CM | POA: Diagnosis not present

## 2021-05-01 DIAGNOSIS — D631 Anemia in chronic kidney disease: Secondary | ICD-10-CM | POA: Diagnosis not present

## 2021-05-01 DIAGNOSIS — D509 Iron deficiency anemia, unspecified: Secondary | ICD-10-CM | POA: Diagnosis not present

## 2021-05-02 DIAGNOSIS — Z992 Dependence on renal dialysis: Secondary | ICD-10-CM | POA: Diagnosis not present

## 2021-05-02 DIAGNOSIS — N186 End stage renal disease: Secondary | ICD-10-CM | POA: Diagnosis not present

## 2021-05-03 DIAGNOSIS — Z992 Dependence on renal dialysis: Secondary | ICD-10-CM | POA: Diagnosis not present

## 2021-05-03 DIAGNOSIS — D631 Anemia in chronic kidney disease: Secondary | ICD-10-CM | POA: Diagnosis not present

## 2021-05-03 DIAGNOSIS — D509 Iron deficiency anemia, unspecified: Secondary | ICD-10-CM | POA: Diagnosis not present

## 2021-05-03 DIAGNOSIS — N186 End stage renal disease: Secondary | ICD-10-CM | POA: Diagnosis not present

## 2021-05-03 DIAGNOSIS — N2581 Secondary hyperparathyroidism of renal origin: Secondary | ICD-10-CM | POA: Diagnosis not present

## 2021-05-05 DIAGNOSIS — N186 End stage renal disease: Secondary | ICD-10-CM | POA: Diagnosis not present

## 2021-05-05 DIAGNOSIS — D509 Iron deficiency anemia, unspecified: Secondary | ICD-10-CM | POA: Diagnosis not present

## 2021-05-05 DIAGNOSIS — N2581 Secondary hyperparathyroidism of renal origin: Secondary | ICD-10-CM | POA: Diagnosis not present

## 2021-05-05 DIAGNOSIS — D631 Anemia in chronic kidney disease: Secondary | ICD-10-CM | POA: Diagnosis not present

## 2021-05-05 DIAGNOSIS — Z992 Dependence on renal dialysis: Secondary | ICD-10-CM | POA: Diagnosis not present

## 2021-05-08 DIAGNOSIS — D509 Iron deficiency anemia, unspecified: Secondary | ICD-10-CM | POA: Diagnosis not present

## 2021-05-08 DIAGNOSIS — D631 Anemia in chronic kidney disease: Secondary | ICD-10-CM | POA: Diagnosis not present

## 2021-05-08 DIAGNOSIS — N186 End stage renal disease: Secondary | ICD-10-CM | POA: Diagnosis not present

## 2021-05-08 DIAGNOSIS — N2581 Secondary hyperparathyroidism of renal origin: Secondary | ICD-10-CM | POA: Diagnosis not present

## 2021-05-08 DIAGNOSIS — Z992 Dependence on renal dialysis: Secondary | ICD-10-CM | POA: Diagnosis not present

## 2021-05-10 DIAGNOSIS — D631 Anemia in chronic kidney disease: Secondary | ICD-10-CM | POA: Diagnosis not present

## 2021-05-10 DIAGNOSIS — Z992 Dependence on renal dialysis: Secondary | ICD-10-CM | POA: Diagnosis not present

## 2021-05-10 DIAGNOSIS — N186 End stage renal disease: Secondary | ICD-10-CM | POA: Diagnosis not present

## 2021-05-10 DIAGNOSIS — N2581 Secondary hyperparathyroidism of renal origin: Secondary | ICD-10-CM | POA: Diagnosis not present

## 2021-05-10 DIAGNOSIS — D509 Iron deficiency anemia, unspecified: Secondary | ICD-10-CM | POA: Diagnosis not present

## 2021-05-12 DIAGNOSIS — N186 End stage renal disease: Secondary | ICD-10-CM | POA: Diagnosis not present

## 2021-05-12 DIAGNOSIS — D509 Iron deficiency anemia, unspecified: Secondary | ICD-10-CM | POA: Diagnosis not present

## 2021-05-12 DIAGNOSIS — Z992 Dependence on renal dialysis: Secondary | ICD-10-CM | POA: Diagnosis not present

## 2021-05-12 DIAGNOSIS — N2581 Secondary hyperparathyroidism of renal origin: Secondary | ICD-10-CM | POA: Diagnosis not present

## 2021-05-12 DIAGNOSIS — D631 Anemia in chronic kidney disease: Secondary | ICD-10-CM | POA: Diagnosis not present

## 2021-05-15 DIAGNOSIS — N2581 Secondary hyperparathyroidism of renal origin: Secondary | ICD-10-CM | POA: Diagnosis not present

## 2021-05-15 DIAGNOSIS — Z992 Dependence on renal dialysis: Secondary | ICD-10-CM | POA: Diagnosis not present

## 2021-05-15 DIAGNOSIS — D631 Anemia in chronic kidney disease: Secondary | ICD-10-CM | POA: Diagnosis not present

## 2021-05-15 DIAGNOSIS — D509 Iron deficiency anemia, unspecified: Secondary | ICD-10-CM | POA: Diagnosis not present

## 2021-05-15 DIAGNOSIS — N186 End stage renal disease: Secondary | ICD-10-CM | POA: Diagnosis not present

## 2021-05-16 DIAGNOSIS — E1122 Type 2 diabetes mellitus with diabetic chronic kidney disease: Secondary | ICD-10-CM | POA: Diagnosis not present

## 2021-05-16 DIAGNOSIS — E1165 Type 2 diabetes mellitus with hyperglycemia: Secondary | ICD-10-CM | POA: Diagnosis not present

## 2021-05-16 DIAGNOSIS — Z6822 Body mass index (BMI) 22.0-22.9, adult: Secondary | ICD-10-CM | POA: Diagnosis not present

## 2021-05-16 DIAGNOSIS — Z992 Dependence on renal dialysis: Secondary | ICD-10-CM | POA: Diagnosis not present

## 2021-05-16 DIAGNOSIS — N186 End stage renal disease: Secondary | ICD-10-CM | POA: Diagnosis not present

## 2021-05-16 DIAGNOSIS — Z299 Encounter for prophylactic measures, unspecified: Secondary | ICD-10-CM | POA: Diagnosis not present

## 2021-05-16 DIAGNOSIS — I10 Essential (primary) hypertension: Secondary | ICD-10-CM | POA: Diagnosis not present

## 2021-05-17 DIAGNOSIS — Z992 Dependence on renal dialysis: Secondary | ICD-10-CM | POA: Diagnosis not present

## 2021-05-17 DIAGNOSIS — N2581 Secondary hyperparathyroidism of renal origin: Secondary | ICD-10-CM | POA: Diagnosis not present

## 2021-05-17 DIAGNOSIS — N186 End stage renal disease: Secondary | ICD-10-CM | POA: Diagnosis not present

## 2021-05-17 DIAGNOSIS — D509 Iron deficiency anemia, unspecified: Secondary | ICD-10-CM | POA: Diagnosis not present

## 2021-05-17 DIAGNOSIS — D631 Anemia in chronic kidney disease: Secondary | ICD-10-CM | POA: Diagnosis not present

## 2021-05-18 DIAGNOSIS — H43821 Vitreomacular adhesion, right eye: Secondary | ICD-10-CM | POA: Diagnosis not present

## 2021-05-18 DIAGNOSIS — H26493 Other secondary cataract, bilateral: Secondary | ICD-10-CM | POA: Diagnosis not present

## 2021-05-18 DIAGNOSIS — E113211 Type 2 diabetes mellitus with mild nonproliferative diabetic retinopathy with macular edema, right eye: Secondary | ICD-10-CM | POA: Diagnosis not present

## 2021-05-19 DIAGNOSIS — D631 Anemia in chronic kidney disease: Secondary | ICD-10-CM | POA: Diagnosis not present

## 2021-05-19 DIAGNOSIS — Z992 Dependence on renal dialysis: Secondary | ICD-10-CM | POA: Diagnosis not present

## 2021-05-19 DIAGNOSIS — N186 End stage renal disease: Secondary | ICD-10-CM | POA: Diagnosis not present

## 2021-05-19 DIAGNOSIS — D509 Iron deficiency anemia, unspecified: Secondary | ICD-10-CM | POA: Diagnosis not present

## 2021-05-19 DIAGNOSIS — N2581 Secondary hyperparathyroidism of renal origin: Secondary | ICD-10-CM | POA: Diagnosis not present

## 2021-05-22 DIAGNOSIS — D631 Anemia in chronic kidney disease: Secondary | ICD-10-CM | POA: Diagnosis not present

## 2021-05-22 DIAGNOSIS — Z992 Dependence on renal dialysis: Secondary | ICD-10-CM | POA: Diagnosis not present

## 2021-05-22 DIAGNOSIS — N2581 Secondary hyperparathyroidism of renal origin: Secondary | ICD-10-CM | POA: Diagnosis not present

## 2021-05-22 DIAGNOSIS — N186 End stage renal disease: Secondary | ICD-10-CM | POA: Diagnosis not present

## 2021-05-22 DIAGNOSIS — D509 Iron deficiency anemia, unspecified: Secondary | ICD-10-CM | POA: Diagnosis not present

## 2021-05-24 DIAGNOSIS — Z992 Dependence on renal dialysis: Secondary | ICD-10-CM | POA: Diagnosis not present

## 2021-05-24 DIAGNOSIS — N2581 Secondary hyperparathyroidism of renal origin: Secondary | ICD-10-CM | POA: Diagnosis not present

## 2021-05-24 DIAGNOSIS — D509 Iron deficiency anemia, unspecified: Secondary | ICD-10-CM | POA: Diagnosis not present

## 2021-05-24 DIAGNOSIS — D631 Anemia in chronic kidney disease: Secondary | ICD-10-CM | POA: Diagnosis not present

## 2021-05-24 DIAGNOSIS — N186 End stage renal disease: Secondary | ICD-10-CM | POA: Diagnosis not present

## 2021-05-25 DIAGNOSIS — M2010 Hallux valgus (acquired), unspecified foot: Secondary | ICD-10-CM | POA: Diagnosis not present

## 2021-05-25 DIAGNOSIS — Z89421 Acquired absence of other right toe(s): Secondary | ICD-10-CM | POA: Diagnosis not present

## 2021-05-25 DIAGNOSIS — I739 Peripheral vascular disease, unspecified: Secondary | ICD-10-CM | POA: Diagnosis not present

## 2021-05-25 DIAGNOSIS — L600 Ingrowing nail: Secondary | ICD-10-CM | POA: Diagnosis not present

## 2021-05-25 DIAGNOSIS — M2040 Other hammer toe(s) (acquired), unspecified foot: Secondary | ICD-10-CM | POA: Diagnosis not present

## 2021-05-25 DIAGNOSIS — E1140 Type 2 diabetes mellitus with diabetic neuropathy, unspecified: Secondary | ICD-10-CM | POA: Diagnosis not present

## 2021-05-25 DIAGNOSIS — Z992 Dependence on renal dialysis: Secondary | ICD-10-CM | POA: Diagnosis not present

## 2021-05-26 DIAGNOSIS — Z992 Dependence on renal dialysis: Secondary | ICD-10-CM | POA: Diagnosis not present

## 2021-05-26 DIAGNOSIS — D509 Iron deficiency anemia, unspecified: Secondary | ICD-10-CM | POA: Diagnosis not present

## 2021-05-26 DIAGNOSIS — D631 Anemia in chronic kidney disease: Secondary | ICD-10-CM | POA: Diagnosis not present

## 2021-05-26 DIAGNOSIS — N2581 Secondary hyperparathyroidism of renal origin: Secondary | ICD-10-CM | POA: Diagnosis not present

## 2021-05-26 DIAGNOSIS — N186 End stage renal disease: Secondary | ICD-10-CM | POA: Diagnosis not present

## 2021-05-29 DIAGNOSIS — N2581 Secondary hyperparathyroidism of renal origin: Secondary | ICD-10-CM | POA: Diagnosis not present

## 2021-05-29 DIAGNOSIS — D509 Iron deficiency anemia, unspecified: Secondary | ICD-10-CM | POA: Diagnosis not present

## 2021-05-29 DIAGNOSIS — D631 Anemia in chronic kidney disease: Secondary | ICD-10-CM | POA: Diagnosis not present

## 2021-05-29 DIAGNOSIS — N186 End stage renal disease: Secondary | ICD-10-CM | POA: Diagnosis not present

## 2021-05-29 DIAGNOSIS — Z992 Dependence on renal dialysis: Secondary | ICD-10-CM | POA: Diagnosis not present

## 2021-05-30 DIAGNOSIS — N186 End stage renal disease: Secondary | ICD-10-CM | POA: Diagnosis not present

## 2021-05-30 DIAGNOSIS — Z992 Dependence on renal dialysis: Secondary | ICD-10-CM | POA: Diagnosis not present

## 2021-05-31 DIAGNOSIS — N186 End stage renal disease: Secondary | ICD-10-CM | POA: Diagnosis not present

## 2021-05-31 DIAGNOSIS — D509 Iron deficiency anemia, unspecified: Secondary | ICD-10-CM | POA: Diagnosis not present

## 2021-05-31 DIAGNOSIS — D631 Anemia in chronic kidney disease: Secondary | ICD-10-CM | POA: Diagnosis not present

## 2021-05-31 DIAGNOSIS — Z992 Dependence on renal dialysis: Secondary | ICD-10-CM | POA: Diagnosis not present

## 2021-05-31 DIAGNOSIS — N2581 Secondary hyperparathyroidism of renal origin: Secondary | ICD-10-CM | POA: Diagnosis not present

## 2021-06-01 DIAGNOSIS — E1122 Type 2 diabetes mellitus with diabetic chronic kidney disease: Secondary | ICD-10-CM | POA: Diagnosis not present

## 2021-06-01 DIAGNOSIS — T82858A Stenosis of vascular prosthetic devices, implants and grafts, initial encounter: Secondary | ICD-10-CM | POA: Diagnosis not present

## 2021-06-02 DIAGNOSIS — D631 Anemia in chronic kidney disease: Secondary | ICD-10-CM | POA: Diagnosis not present

## 2021-06-02 DIAGNOSIS — D509 Iron deficiency anemia, unspecified: Secondary | ICD-10-CM | POA: Diagnosis not present

## 2021-06-02 DIAGNOSIS — N2581 Secondary hyperparathyroidism of renal origin: Secondary | ICD-10-CM | POA: Diagnosis not present

## 2021-06-02 DIAGNOSIS — Z992 Dependence on renal dialysis: Secondary | ICD-10-CM | POA: Diagnosis not present

## 2021-06-02 DIAGNOSIS — N186 End stage renal disease: Secondary | ICD-10-CM | POA: Diagnosis not present

## 2021-06-05 DIAGNOSIS — D509 Iron deficiency anemia, unspecified: Secondary | ICD-10-CM | POA: Diagnosis not present

## 2021-06-05 DIAGNOSIS — N186 End stage renal disease: Secondary | ICD-10-CM | POA: Diagnosis not present

## 2021-06-05 DIAGNOSIS — Z992 Dependence on renal dialysis: Secondary | ICD-10-CM | POA: Diagnosis not present

## 2021-06-05 DIAGNOSIS — D631 Anemia in chronic kidney disease: Secondary | ICD-10-CM | POA: Diagnosis not present

## 2021-06-05 DIAGNOSIS — N2581 Secondary hyperparathyroidism of renal origin: Secondary | ICD-10-CM | POA: Diagnosis not present

## 2021-06-07 DIAGNOSIS — D631 Anemia in chronic kidney disease: Secondary | ICD-10-CM | POA: Diagnosis not present

## 2021-06-07 DIAGNOSIS — Z992 Dependence on renal dialysis: Secondary | ICD-10-CM | POA: Diagnosis not present

## 2021-06-07 DIAGNOSIS — D509 Iron deficiency anemia, unspecified: Secondary | ICD-10-CM | POA: Diagnosis not present

## 2021-06-07 DIAGNOSIS — N186 End stage renal disease: Secondary | ICD-10-CM | POA: Diagnosis not present

## 2021-06-07 DIAGNOSIS — N2581 Secondary hyperparathyroidism of renal origin: Secondary | ICD-10-CM | POA: Diagnosis not present

## 2021-06-09 DIAGNOSIS — N186 End stage renal disease: Secondary | ICD-10-CM | POA: Diagnosis not present

## 2021-06-09 DIAGNOSIS — Z992 Dependence on renal dialysis: Secondary | ICD-10-CM | POA: Diagnosis not present

## 2021-06-09 DIAGNOSIS — N2581 Secondary hyperparathyroidism of renal origin: Secondary | ICD-10-CM | POA: Diagnosis not present

## 2021-06-09 DIAGNOSIS — D509 Iron deficiency anemia, unspecified: Secondary | ICD-10-CM | POA: Diagnosis not present

## 2021-06-09 DIAGNOSIS — D631 Anemia in chronic kidney disease: Secondary | ICD-10-CM | POA: Diagnosis not present

## 2021-06-12 DIAGNOSIS — D631 Anemia in chronic kidney disease: Secondary | ICD-10-CM | POA: Diagnosis not present

## 2021-06-12 DIAGNOSIS — N186 End stage renal disease: Secondary | ICD-10-CM | POA: Diagnosis not present

## 2021-06-12 DIAGNOSIS — Z992 Dependence on renal dialysis: Secondary | ICD-10-CM | POA: Diagnosis not present

## 2021-06-12 DIAGNOSIS — D509 Iron deficiency anemia, unspecified: Secondary | ICD-10-CM | POA: Diagnosis not present

## 2021-06-12 DIAGNOSIS — N2581 Secondary hyperparathyroidism of renal origin: Secondary | ICD-10-CM | POA: Diagnosis not present

## 2021-06-14 DIAGNOSIS — D509 Iron deficiency anemia, unspecified: Secondary | ICD-10-CM | POA: Diagnosis not present

## 2021-06-14 DIAGNOSIS — D631 Anemia in chronic kidney disease: Secondary | ICD-10-CM | POA: Diagnosis not present

## 2021-06-14 DIAGNOSIS — Z992 Dependence on renal dialysis: Secondary | ICD-10-CM | POA: Diagnosis not present

## 2021-06-14 DIAGNOSIS — N186 End stage renal disease: Secondary | ICD-10-CM | POA: Diagnosis not present

## 2021-06-14 DIAGNOSIS — N2581 Secondary hyperparathyroidism of renal origin: Secondary | ICD-10-CM | POA: Diagnosis not present

## 2021-06-16 DIAGNOSIS — D509 Iron deficiency anemia, unspecified: Secondary | ICD-10-CM | POA: Diagnosis not present

## 2021-06-16 DIAGNOSIS — Z992 Dependence on renal dialysis: Secondary | ICD-10-CM | POA: Diagnosis not present

## 2021-06-16 DIAGNOSIS — D631 Anemia in chronic kidney disease: Secondary | ICD-10-CM | POA: Diagnosis not present

## 2021-06-16 DIAGNOSIS — N186 End stage renal disease: Secondary | ICD-10-CM | POA: Diagnosis not present

## 2021-06-16 DIAGNOSIS — N2581 Secondary hyperparathyroidism of renal origin: Secondary | ICD-10-CM | POA: Diagnosis not present

## 2021-06-19 DIAGNOSIS — N186 End stage renal disease: Secondary | ICD-10-CM | POA: Diagnosis not present

## 2021-06-19 DIAGNOSIS — Z992 Dependence on renal dialysis: Secondary | ICD-10-CM | POA: Diagnosis not present

## 2021-06-19 DIAGNOSIS — D631 Anemia in chronic kidney disease: Secondary | ICD-10-CM | POA: Diagnosis not present

## 2021-06-19 DIAGNOSIS — D509 Iron deficiency anemia, unspecified: Secondary | ICD-10-CM | POA: Diagnosis not present

## 2021-06-19 DIAGNOSIS — N2581 Secondary hyperparathyroidism of renal origin: Secondary | ICD-10-CM | POA: Diagnosis not present

## 2021-06-21 DIAGNOSIS — N2581 Secondary hyperparathyroidism of renal origin: Secondary | ICD-10-CM | POA: Diagnosis not present

## 2021-06-21 DIAGNOSIS — Z992 Dependence on renal dialysis: Secondary | ICD-10-CM | POA: Diagnosis not present

## 2021-06-21 DIAGNOSIS — D631 Anemia in chronic kidney disease: Secondary | ICD-10-CM | POA: Diagnosis not present

## 2021-06-21 DIAGNOSIS — D509 Iron deficiency anemia, unspecified: Secondary | ICD-10-CM | POA: Diagnosis not present

## 2021-06-21 DIAGNOSIS — N186 End stage renal disease: Secondary | ICD-10-CM | POA: Diagnosis not present

## 2021-06-23 DIAGNOSIS — D631 Anemia in chronic kidney disease: Secondary | ICD-10-CM | POA: Diagnosis not present

## 2021-06-23 DIAGNOSIS — N2581 Secondary hyperparathyroidism of renal origin: Secondary | ICD-10-CM | POA: Diagnosis not present

## 2021-06-23 DIAGNOSIS — N186 End stage renal disease: Secondary | ICD-10-CM | POA: Diagnosis not present

## 2021-06-23 DIAGNOSIS — D509 Iron deficiency anemia, unspecified: Secondary | ICD-10-CM | POA: Diagnosis not present

## 2021-06-23 DIAGNOSIS — Z992 Dependence on renal dialysis: Secondary | ICD-10-CM | POA: Diagnosis not present

## 2021-06-26 DIAGNOSIS — D509 Iron deficiency anemia, unspecified: Secondary | ICD-10-CM | POA: Diagnosis not present

## 2021-06-26 DIAGNOSIS — N186 End stage renal disease: Secondary | ICD-10-CM | POA: Diagnosis not present

## 2021-06-26 DIAGNOSIS — N2581 Secondary hyperparathyroidism of renal origin: Secondary | ICD-10-CM | POA: Diagnosis not present

## 2021-06-26 DIAGNOSIS — Z992 Dependence on renal dialysis: Secondary | ICD-10-CM | POA: Diagnosis not present

## 2021-06-26 DIAGNOSIS — D631 Anemia in chronic kidney disease: Secondary | ICD-10-CM | POA: Diagnosis not present

## 2021-06-28 DIAGNOSIS — N2581 Secondary hyperparathyroidism of renal origin: Secondary | ICD-10-CM | POA: Diagnosis not present

## 2021-06-28 DIAGNOSIS — D509 Iron deficiency anemia, unspecified: Secondary | ICD-10-CM | POA: Diagnosis not present

## 2021-06-28 DIAGNOSIS — N186 End stage renal disease: Secondary | ICD-10-CM | POA: Diagnosis not present

## 2021-06-28 DIAGNOSIS — Z992 Dependence on renal dialysis: Secondary | ICD-10-CM | POA: Diagnosis not present

## 2021-06-28 DIAGNOSIS — D631 Anemia in chronic kidney disease: Secondary | ICD-10-CM | POA: Diagnosis not present

## 2021-06-30 DIAGNOSIS — N2581 Secondary hyperparathyroidism of renal origin: Secondary | ICD-10-CM | POA: Diagnosis not present

## 2021-06-30 DIAGNOSIS — N186 End stage renal disease: Secondary | ICD-10-CM | POA: Diagnosis not present

## 2021-06-30 DIAGNOSIS — D509 Iron deficiency anemia, unspecified: Secondary | ICD-10-CM | POA: Diagnosis not present

## 2021-06-30 DIAGNOSIS — D631 Anemia in chronic kidney disease: Secondary | ICD-10-CM | POA: Diagnosis not present

## 2021-06-30 DIAGNOSIS — Z992 Dependence on renal dialysis: Secondary | ICD-10-CM | POA: Diagnosis not present

## 2021-07-03 DIAGNOSIS — N186 End stage renal disease: Secondary | ICD-10-CM | POA: Diagnosis not present

## 2021-07-03 DIAGNOSIS — N2581 Secondary hyperparathyroidism of renal origin: Secondary | ICD-10-CM | POA: Diagnosis not present

## 2021-07-03 DIAGNOSIS — D631 Anemia in chronic kidney disease: Secondary | ICD-10-CM | POA: Diagnosis not present

## 2021-07-03 DIAGNOSIS — D509 Iron deficiency anemia, unspecified: Secondary | ICD-10-CM | POA: Diagnosis not present

## 2021-07-03 DIAGNOSIS — Z992 Dependence on renal dialysis: Secondary | ICD-10-CM | POA: Diagnosis not present

## 2021-07-03 DIAGNOSIS — E1129 Type 2 diabetes mellitus with other diabetic kidney complication: Secondary | ICD-10-CM | POA: Diagnosis not present

## 2021-07-05 DIAGNOSIS — D631 Anemia in chronic kidney disease: Secondary | ICD-10-CM | POA: Diagnosis not present

## 2021-07-05 DIAGNOSIS — D509 Iron deficiency anemia, unspecified: Secondary | ICD-10-CM | POA: Diagnosis not present

## 2021-07-05 DIAGNOSIS — N2581 Secondary hyperparathyroidism of renal origin: Secondary | ICD-10-CM | POA: Diagnosis not present

## 2021-07-05 DIAGNOSIS — Z992 Dependence on renal dialysis: Secondary | ICD-10-CM | POA: Diagnosis not present

## 2021-07-05 DIAGNOSIS — N186 End stage renal disease: Secondary | ICD-10-CM | POA: Diagnosis not present

## 2021-07-07 DIAGNOSIS — N2581 Secondary hyperparathyroidism of renal origin: Secondary | ICD-10-CM | POA: Diagnosis not present

## 2021-07-07 DIAGNOSIS — D509 Iron deficiency anemia, unspecified: Secondary | ICD-10-CM | POA: Diagnosis not present

## 2021-07-07 DIAGNOSIS — N186 End stage renal disease: Secondary | ICD-10-CM | POA: Diagnosis not present

## 2021-07-07 DIAGNOSIS — D631 Anemia in chronic kidney disease: Secondary | ICD-10-CM | POA: Diagnosis not present

## 2021-07-07 DIAGNOSIS — Z992 Dependence on renal dialysis: Secondary | ICD-10-CM | POA: Diagnosis not present

## 2021-07-10 DIAGNOSIS — N186 End stage renal disease: Secondary | ICD-10-CM | POA: Diagnosis not present

## 2021-07-10 DIAGNOSIS — Z992 Dependence on renal dialysis: Secondary | ICD-10-CM | POA: Diagnosis not present

## 2021-07-10 DIAGNOSIS — D631 Anemia in chronic kidney disease: Secondary | ICD-10-CM | POA: Diagnosis not present

## 2021-07-10 DIAGNOSIS — D509 Iron deficiency anemia, unspecified: Secondary | ICD-10-CM | POA: Diagnosis not present

## 2021-07-10 DIAGNOSIS — N2581 Secondary hyperparathyroidism of renal origin: Secondary | ICD-10-CM | POA: Diagnosis not present

## 2021-07-11 DIAGNOSIS — E113311 Type 2 diabetes mellitus with moderate nonproliferative diabetic retinopathy with macular edema, right eye: Secondary | ICD-10-CM | POA: Diagnosis not present

## 2021-07-12 DIAGNOSIS — N2581 Secondary hyperparathyroidism of renal origin: Secondary | ICD-10-CM | POA: Diagnosis not present

## 2021-07-12 DIAGNOSIS — N186 End stage renal disease: Secondary | ICD-10-CM | POA: Diagnosis not present

## 2021-07-12 DIAGNOSIS — D631 Anemia in chronic kidney disease: Secondary | ICD-10-CM | POA: Diagnosis not present

## 2021-07-12 DIAGNOSIS — D509 Iron deficiency anemia, unspecified: Secondary | ICD-10-CM | POA: Diagnosis not present

## 2021-07-12 DIAGNOSIS — Z992 Dependence on renal dialysis: Secondary | ICD-10-CM | POA: Diagnosis not present

## 2021-07-14 DIAGNOSIS — N186 End stage renal disease: Secondary | ICD-10-CM | POA: Diagnosis not present

## 2021-07-14 DIAGNOSIS — D631 Anemia in chronic kidney disease: Secondary | ICD-10-CM | POA: Diagnosis not present

## 2021-07-14 DIAGNOSIS — N2581 Secondary hyperparathyroidism of renal origin: Secondary | ICD-10-CM | POA: Diagnosis not present

## 2021-07-14 DIAGNOSIS — Z992 Dependence on renal dialysis: Secondary | ICD-10-CM | POA: Diagnosis not present

## 2021-07-14 DIAGNOSIS — D509 Iron deficiency anemia, unspecified: Secondary | ICD-10-CM | POA: Diagnosis not present

## 2021-07-17 DIAGNOSIS — Z992 Dependence on renal dialysis: Secondary | ICD-10-CM | POA: Diagnosis not present

## 2021-07-17 DIAGNOSIS — N186 End stage renal disease: Secondary | ICD-10-CM | POA: Diagnosis not present

## 2021-07-17 DIAGNOSIS — D509 Iron deficiency anemia, unspecified: Secondary | ICD-10-CM | POA: Diagnosis not present

## 2021-07-17 DIAGNOSIS — D631 Anemia in chronic kidney disease: Secondary | ICD-10-CM | POA: Diagnosis not present

## 2021-07-17 DIAGNOSIS — N2581 Secondary hyperparathyroidism of renal origin: Secondary | ICD-10-CM | POA: Diagnosis not present

## 2021-07-19 DIAGNOSIS — N186 End stage renal disease: Secondary | ICD-10-CM | POA: Diagnosis not present

## 2021-07-19 DIAGNOSIS — N2581 Secondary hyperparathyroidism of renal origin: Secondary | ICD-10-CM | POA: Diagnosis not present

## 2021-07-19 DIAGNOSIS — D631 Anemia in chronic kidney disease: Secondary | ICD-10-CM | POA: Diagnosis not present

## 2021-07-19 DIAGNOSIS — Z992 Dependence on renal dialysis: Secondary | ICD-10-CM | POA: Diagnosis not present

## 2021-07-19 DIAGNOSIS — D509 Iron deficiency anemia, unspecified: Secondary | ICD-10-CM | POA: Diagnosis not present

## 2021-07-21 DIAGNOSIS — N186 End stage renal disease: Secondary | ICD-10-CM | POA: Diagnosis not present

## 2021-07-21 DIAGNOSIS — Z992 Dependence on renal dialysis: Secondary | ICD-10-CM | POA: Diagnosis not present

## 2021-07-21 DIAGNOSIS — D509 Iron deficiency anemia, unspecified: Secondary | ICD-10-CM | POA: Diagnosis not present

## 2021-07-21 DIAGNOSIS — N2581 Secondary hyperparathyroidism of renal origin: Secondary | ICD-10-CM | POA: Diagnosis not present

## 2021-07-21 DIAGNOSIS — D631 Anemia in chronic kidney disease: Secondary | ICD-10-CM | POA: Diagnosis not present

## 2021-07-24 DIAGNOSIS — D509 Iron deficiency anemia, unspecified: Secondary | ICD-10-CM | POA: Diagnosis not present

## 2021-07-24 DIAGNOSIS — N186 End stage renal disease: Secondary | ICD-10-CM | POA: Diagnosis not present

## 2021-07-24 DIAGNOSIS — N2581 Secondary hyperparathyroidism of renal origin: Secondary | ICD-10-CM | POA: Diagnosis not present

## 2021-07-24 DIAGNOSIS — Z992 Dependence on renal dialysis: Secondary | ICD-10-CM | POA: Diagnosis not present

## 2021-07-24 DIAGNOSIS — D631 Anemia in chronic kidney disease: Secondary | ICD-10-CM | POA: Diagnosis not present

## 2021-07-26 DIAGNOSIS — N2581 Secondary hyperparathyroidism of renal origin: Secondary | ICD-10-CM | POA: Diagnosis not present

## 2021-07-26 DIAGNOSIS — D631 Anemia in chronic kidney disease: Secondary | ICD-10-CM | POA: Diagnosis not present

## 2021-07-26 DIAGNOSIS — Z992 Dependence on renal dialysis: Secondary | ICD-10-CM | POA: Diagnosis not present

## 2021-07-26 DIAGNOSIS — D509 Iron deficiency anemia, unspecified: Secondary | ICD-10-CM | POA: Diagnosis not present

## 2021-07-26 DIAGNOSIS — N186 End stage renal disease: Secondary | ICD-10-CM | POA: Diagnosis not present

## 2021-07-28 DIAGNOSIS — D509 Iron deficiency anemia, unspecified: Secondary | ICD-10-CM | POA: Diagnosis not present

## 2021-07-28 DIAGNOSIS — N186 End stage renal disease: Secondary | ICD-10-CM | POA: Diagnosis not present

## 2021-07-28 DIAGNOSIS — Z992 Dependence on renal dialysis: Secondary | ICD-10-CM | POA: Diagnosis not present

## 2021-07-28 DIAGNOSIS — D631 Anemia in chronic kidney disease: Secondary | ICD-10-CM | POA: Diagnosis not present

## 2021-07-28 DIAGNOSIS — N2581 Secondary hyperparathyroidism of renal origin: Secondary | ICD-10-CM | POA: Diagnosis not present

## 2021-07-30 DIAGNOSIS — N186 End stage renal disease: Secondary | ICD-10-CM | POA: Diagnosis not present

## 2021-07-30 DIAGNOSIS — Z992 Dependence on renal dialysis: Secondary | ICD-10-CM | POA: Diagnosis not present

## 2021-07-31 DIAGNOSIS — D631 Anemia in chronic kidney disease: Secondary | ICD-10-CM | POA: Diagnosis not present

## 2021-07-31 DIAGNOSIS — N186 End stage renal disease: Secondary | ICD-10-CM | POA: Diagnosis not present

## 2021-07-31 DIAGNOSIS — Z992 Dependence on renal dialysis: Secondary | ICD-10-CM | POA: Diagnosis not present

## 2021-07-31 DIAGNOSIS — N2581 Secondary hyperparathyroidism of renal origin: Secondary | ICD-10-CM | POA: Diagnosis not present

## 2021-07-31 DIAGNOSIS — D509 Iron deficiency anemia, unspecified: Secondary | ICD-10-CM | POA: Diagnosis not present

## 2021-08-02 DIAGNOSIS — Z992 Dependence on renal dialysis: Secondary | ICD-10-CM | POA: Diagnosis not present

## 2021-08-02 DIAGNOSIS — D631 Anemia in chronic kidney disease: Secondary | ICD-10-CM | POA: Diagnosis not present

## 2021-08-02 DIAGNOSIS — N2581 Secondary hyperparathyroidism of renal origin: Secondary | ICD-10-CM | POA: Diagnosis not present

## 2021-08-02 DIAGNOSIS — D509 Iron deficiency anemia, unspecified: Secondary | ICD-10-CM | POA: Diagnosis not present

## 2021-08-02 DIAGNOSIS — N186 End stage renal disease: Secondary | ICD-10-CM | POA: Diagnosis not present

## 2021-08-04 DIAGNOSIS — Z992 Dependence on renal dialysis: Secondary | ICD-10-CM | POA: Diagnosis not present

## 2021-08-04 DIAGNOSIS — N186 End stage renal disease: Secondary | ICD-10-CM | POA: Diagnosis not present

## 2021-08-04 DIAGNOSIS — D509 Iron deficiency anemia, unspecified: Secondary | ICD-10-CM | POA: Diagnosis not present

## 2021-08-04 DIAGNOSIS — N2581 Secondary hyperparathyroidism of renal origin: Secondary | ICD-10-CM | POA: Diagnosis not present

## 2021-08-04 DIAGNOSIS — D631 Anemia in chronic kidney disease: Secondary | ICD-10-CM | POA: Diagnosis not present

## 2021-08-07 DIAGNOSIS — Z992 Dependence on renal dialysis: Secondary | ICD-10-CM | POA: Diagnosis not present

## 2021-08-07 DIAGNOSIS — D509 Iron deficiency anemia, unspecified: Secondary | ICD-10-CM | POA: Diagnosis not present

## 2021-08-07 DIAGNOSIS — N2581 Secondary hyperparathyroidism of renal origin: Secondary | ICD-10-CM | POA: Diagnosis not present

## 2021-08-07 DIAGNOSIS — D631 Anemia in chronic kidney disease: Secondary | ICD-10-CM | POA: Diagnosis not present

## 2021-08-07 DIAGNOSIS — N186 End stage renal disease: Secondary | ICD-10-CM | POA: Diagnosis not present

## 2021-08-09 DIAGNOSIS — N186 End stage renal disease: Secondary | ICD-10-CM | POA: Diagnosis not present

## 2021-08-09 DIAGNOSIS — D509 Iron deficiency anemia, unspecified: Secondary | ICD-10-CM | POA: Diagnosis not present

## 2021-08-09 DIAGNOSIS — N2581 Secondary hyperparathyroidism of renal origin: Secondary | ICD-10-CM | POA: Diagnosis not present

## 2021-08-09 DIAGNOSIS — Z992 Dependence on renal dialysis: Secondary | ICD-10-CM | POA: Diagnosis not present

## 2021-08-09 DIAGNOSIS — D631 Anemia in chronic kidney disease: Secondary | ICD-10-CM | POA: Diagnosis not present

## 2021-08-11 DIAGNOSIS — D631 Anemia in chronic kidney disease: Secondary | ICD-10-CM | POA: Diagnosis not present

## 2021-08-11 DIAGNOSIS — D509 Iron deficiency anemia, unspecified: Secondary | ICD-10-CM | POA: Diagnosis not present

## 2021-08-11 DIAGNOSIS — N186 End stage renal disease: Secondary | ICD-10-CM | POA: Diagnosis not present

## 2021-08-11 DIAGNOSIS — Z992 Dependence on renal dialysis: Secondary | ICD-10-CM | POA: Diagnosis not present

## 2021-08-11 DIAGNOSIS — N2581 Secondary hyperparathyroidism of renal origin: Secondary | ICD-10-CM | POA: Diagnosis not present

## 2021-08-14 DIAGNOSIS — N2581 Secondary hyperparathyroidism of renal origin: Secondary | ICD-10-CM | POA: Diagnosis not present

## 2021-08-14 DIAGNOSIS — D631 Anemia in chronic kidney disease: Secondary | ICD-10-CM | POA: Diagnosis not present

## 2021-08-14 DIAGNOSIS — Z992 Dependence on renal dialysis: Secondary | ICD-10-CM | POA: Diagnosis not present

## 2021-08-14 DIAGNOSIS — N186 End stage renal disease: Secondary | ICD-10-CM | POA: Diagnosis not present

## 2021-08-14 DIAGNOSIS — D509 Iron deficiency anemia, unspecified: Secondary | ICD-10-CM | POA: Diagnosis not present

## 2021-08-16 DIAGNOSIS — N2581 Secondary hyperparathyroidism of renal origin: Secondary | ICD-10-CM | POA: Diagnosis not present

## 2021-08-16 DIAGNOSIS — D631 Anemia in chronic kidney disease: Secondary | ICD-10-CM | POA: Diagnosis not present

## 2021-08-16 DIAGNOSIS — Z992 Dependence on renal dialysis: Secondary | ICD-10-CM | POA: Diagnosis not present

## 2021-08-16 DIAGNOSIS — N186 End stage renal disease: Secondary | ICD-10-CM | POA: Diagnosis not present

## 2021-08-16 DIAGNOSIS — D509 Iron deficiency anemia, unspecified: Secondary | ICD-10-CM | POA: Diagnosis not present

## 2021-08-18 DIAGNOSIS — D631 Anemia in chronic kidney disease: Secondary | ICD-10-CM | POA: Diagnosis not present

## 2021-08-18 DIAGNOSIS — N186 End stage renal disease: Secondary | ICD-10-CM | POA: Diagnosis not present

## 2021-08-18 DIAGNOSIS — D509 Iron deficiency anemia, unspecified: Secondary | ICD-10-CM | POA: Diagnosis not present

## 2021-08-18 DIAGNOSIS — N2581 Secondary hyperparathyroidism of renal origin: Secondary | ICD-10-CM | POA: Diagnosis not present

## 2021-08-18 DIAGNOSIS — Z992 Dependence on renal dialysis: Secondary | ICD-10-CM | POA: Diagnosis not present

## 2021-08-21 DIAGNOSIS — D509 Iron deficiency anemia, unspecified: Secondary | ICD-10-CM | POA: Diagnosis not present

## 2021-08-21 DIAGNOSIS — N186 End stage renal disease: Secondary | ICD-10-CM | POA: Diagnosis not present

## 2021-08-21 DIAGNOSIS — D631 Anemia in chronic kidney disease: Secondary | ICD-10-CM | POA: Diagnosis not present

## 2021-08-21 DIAGNOSIS — N2581 Secondary hyperparathyroidism of renal origin: Secondary | ICD-10-CM | POA: Diagnosis not present

## 2021-08-21 DIAGNOSIS — Z992 Dependence on renal dialysis: Secondary | ICD-10-CM | POA: Diagnosis not present

## 2021-08-22 DIAGNOSIS — E113311 Type 2 diabetes mellitus with moderate nonproliferative diabetic retinopathy with macular edema, right eye: Secondary | ICD-10-CM | POA: Diagnosis not present

## 2021-08-23 DIAGNOSIS — D509 Iron deficiency anemia, unspecified: Secondary | ICD-10-CM | POA: Diagnosis not present

## 2021-08-23 DIAGNOSIS — N2581 Secondary hyperparathyroidism of renal origin: Secondary | ICD-10-CM | POA: Diagnosis not present

## 2021-08-23 DIAGNOSIS — Z992 Dependence on renal dialysis: Secondary | ICD-10-CM | POA: Diagnosis not present

## 2021-08-23 DIAGNOSIS — N186 End stage renal disease: Secondary | ICD-10-CM | POA: Diagnosis not present

## 2021-08-23 DIAGNOSIS — D631 Anemia in chronic kidney disease: Secondary | ICD-10-CM | POA: Diagnosis not present

## 2021-08-25 DIAGNOSIS — N2581 Secondary hyperparathyroidism of renal origin: Secondary | ICD-10-CM | POA: Diagnosis not present

## 2021-08-25 DIAGNOSIS — Z992 Dependence on renal dialysis: Secondary | ICD-10-CM | POA: Diagnosis not present

## 2021-08-25 DIAGNOSIS — D631 Anemia in chronic kidney disease: Secondary | ICD-10-CM | POA: Diagnosis not present

## 2021-08-25 DIAGNOSIS — N186 End stage renal disease: Secondary | ICD-10-CM | POA: Diagnosis not present

## 2021-08-25 DIAGNOSIS — D509 Iron deficiency anemia, unspecified: Secondary | ICD-10-CM | POA: Diagnosis not present

## 2021-08-28 DIAGNOSIS — D509 Iron deficiency anemia, unspecified: Secondary | ICD-10-CM | POA: Diagnosis not present

## 2021-08-28 DIAGNOSIS — D631 Anemia in chronic kidney disease: Secondary | ICD-10-CM | POA: Diagnosis not present

## 2021-08-28 DIAGNOSIS — N186 End stage renal disease: Secondary | ICD-10-CM | POA: Diagnosis not present

## 2021-08-28 DIAGNOSIS — Z992 Dependence on renal dialysis: Secondary | ICD-10-CM | POA: Diagnosis not present

## 2021-08-28 DIAGNOSIS — N2581 Secondary hyperparathyroidism of renal origin: Secondary | ICD-10-CM | POA: Diagnosis not present

## 2021-08-30 DIAGNOSIS — Z992 Dependence on renal dialysis: Secondary | ICD-10-CM | POA: Diagnosis not present

## 2021-08-30 DIAGNOSIS — N2581 Secondary hyperparathyroidism of renal origin: Secondary | ICD-10-CM | POA: Diagnosis not present

## 2021-08-30 DIAGNOSIS — N186 End stage renal disease: Secondary | ICD-10-CM | POA: Diagnosis not present

## 2021-08-30 DIAGNOSIS — D631 Anemia in chronic kidney disease: Secondary | ICD-10-CM | POA: Diagnosis not present

## 2021-08-30 DIAGNOSIS — D509 Iron deficiency anemia, unspecified: Secondary | ICD-10-CM | POA: Diagnosis not present

## 2021-09-01 DIAGNOSIS — Z992 Dependence on renal dialysis: Secondary | ICD-10-CM | POA: Diagnosis not present

## 2021-09-01 DIAGNOSIS — D631 Anemia in chronic kidney disease: Secondary | ICD-10-CM | POA: Diagnosis not present

## 2021-09-01 DIAGNOSIS — N2581 Secondary hyperparathyroidism of renal origin: Secondary | ICD-10-CM | POA: Diagnosis not present

## 2021-09-01 DIAGNOSIS — N186 End stage renal disease: Secondary | ICD-10-CM | POA: Diagnosis not present

## 2021-09-04 DIAGNOSIS — N186 End stage renal disease: Secondary | ICD-10-CM | POA: Diagnosis not present

## 2021-09-04 DIAGNOSIS — N2581 Secondary hyperparathyroidism of renal origin: Secondary | ICD-10-CM | POA: Diagnosis not present

## 2021-09-04 DIAGNOSIS — D631 Anemia in chronic kidney disease: Secondary | ICD-10-CM | POA: Diagnosis not present

## 2021-09-04 DIAGNOSIS — Z992 Dependence on renal dialysis: Secondary | ICD-10-CM | POA: Diagnosis not present

## 2021-09-05 DIAGNOSIS — Z0000 Encounter for general adult medical examination without abnormal findings: Secondary | ICD-10-CM | POA: Diagnosis not present

## 2021-09-05 DIAGNOSIS — E1122 Type 2 diabetes mellitus with diabetic chronic kidney disease: Secondary | ICD-10-CM | POA: Diagnosis not present

## 2021-09-05 DIAGNOSIS — N186 End stage renal disease: Secondary | ICD-10-CM | POA: Diagnosis not present

## 2021-09-05 DIAGNOSIS — Z299 Encounter for prophylactic measures, unspecified: Secondary | ICD-10-CM | POA: Diagnosis not present

## 2021-09-05 DIAGNOSIS — I10 Essential (primary) hypertension: Secondary | ICD-10-CM | POA: Diagnosis not present

## 2021-09-05 DIAGNOSIS — Z6822 Body mass index (BMI) 22.0-22.9, adult: Secondary | ICD-10-CM | POA: Diagnosis not present

## 2021-09-05 DIAGNOSIS — Z1331 Encounter for screening for depression: Secondary | ICD-10-CM | POA: Diagnosis not present

## 2021-09-05 DIAGNOSIS — Z1339 Encounter for screening examination for other mental health and behavioral disorders: Secondary | ICD-10-CM | POA: Diagnosis not present

## 2021-09-05 DIAGNOSIS — Z7189 Other specified counseling: Secondary | ICD-10-CM | POA: Diagnosis not present

## 2021-09-06 DIAGNOSIS — N2581 Secondary hyperparathyroidism of renal origin: Secondary | ICD-10-CM | POA: Diagnosis not present

## 2021-09-06 DIAGNOSIS — Z992 Dependence on renal dialysis: Secondary | ICD-10-CM | POA: Diagnosis not present

## 2021-09-06 DIAGNOSIS — D631 Anemia in chronic kidney disease: Secondary | ICD-10-CM | POA: Diagnosis not present

## 2021-09-06 DIAGNOSIS — N186 End stage renal disease: Secondary | ICD-10-CM | POA: Diagnosis not present

## 2021-09-08 DIAGNOSIS — D631 Anemia in chronic kidney disease: Secondary | ICD-10-CM | POA: Diagnosis not present

## 2021-09-08 DIAGNOSIS — N186 End stage renal disease: Secondary | ICD-10-CM | POA: Diagnosis not present

## 2021-09-08 DIAGNOSIS — Z992 Dependence on renal dialysis: Secondary | ICD-10-CM | POA: Diagnosis not present

## 2021-09-08 DIAGNOSIS — N2581 Secondary hyperparathyroidism of renal origin: Secondary | ICD-10-CM | POA: Diagnosis not present

## 2021-09-11 DIAGNOSIS — Z992 Dependence on renal dialysis: Secondary | ICD-10-CM | POA: Diagnosis not present

## 2021-09-11 DIAGNOSIS — N2581 Secondary hyperparathyroidism of renal origin: Secondary | ICD-10-CM | POA: Diagnosis not present

## 2021-09-11 DIAGNOSIS — D631 Anemia in chronic kidney disease: Secondary | ICD-10-CM | POA: Diagnosis not present

## 2021-09-11 DIAGNOSIS — N186 End stage renal disease: Secondary | ICD-10-CM | POA: Diagnosis not present

## 2021-09-12 DIAGNOSIS — E1140 Type 2 diabetes mellitus with diabetic neuropathy, unspecified: Secondary | ICD-10-CM | POA: Diagnosis not present

## 2021-09-12 DIAGNOSIS — L600 Ingrowing nail: Secondary | ICD-10-CM | POA: Diagnosis not present

## 2021-09-12 DIAGNOSIS — Z992 Dependence on renal dialysis: Secondary | ICD-10-CM | POA: Diagnosis not present

## 2021-09-12 DIAGNOSIS — M79671 Pain in right foot: Secondary | ICD-10-CM | POA: Diagnosis not present

## 2021-09-13 DIAGNOSIS — N186 End stage renal disease: Secondary | ICD-10-CM | POA: Diagnosis not present

## 2021-09-13 DIAGNOSIS — D631 Anemia in chronic kidney disease: Secondary | ICD-10-CM | POA: Diagnosis not present

## 2021-09-13 DIAGNOSIS — N2581 Secondary hyperparathyroidism of renal origin: Secondary | ICD-10-CM | POA: Diagnosis not present

## 2021-09-13 DIAGNOSIS — Z992 Dependence on renal dialysis: Secondary | ICD-10-CM | POA: Diagnosis not present

## 2021-09-15 DIAGNOSIS — Z992 Dependence on renal dialysis: Secondary | ICD-10-CM | POA: Diagnosis not present

## 2021-09-15 DIAGNOSIS — N186 End stage renal disease: Secondary | ICD-10-CM | POA: Diagnosis not present

## 2021-09-15 DIAGNOSIS — N2581 Secondary hyperparathyroidism of renal origin: Secondary | ICD-10-CM | POA: Diagnosis not present

## 2021-09-15 DIAGNOSIS — D631 Anemia in chronic kidney disease: Secondary | ICD-10-CM | POA: Diagnosis not present

## 2021-09-18 DIAGNOSIS — N186 End stage renal disease: Secondary | ICD-10-CM | POA: Diagnosis not present

## 2021-09-18 DIAGNOSIS — N2581 Secondary hyperparathyroidism of renal origin: Secondary | ICD-10-CM | POA: Diagnosis not present

## 2021-09-18 DIAGNOSIS — D631 Anemia in chronic kidney disease: Secondary | ICD-10-CM | POA: Diagnosis not present

## 2021-09-18 DIAGNOSIS — Z992 Dependence on renal dialysis: Secondary | ICD-10-CM | POA: Diagnosis not present

## 2021-09-20 DIAGNOSIS — N186 End stage renal disease: Secondary | ICD-10-CM | POA: Diagnosis not present

## 2021-09-20 DIAGNOSIS — N2581 Secondary hyperparathyroidism of renal origin: Secondary | ICD-10-CM | POA: Diagnosis not present

## 2021-09-20 DIAGNOSIS — Z992 Dependence on renal dialysis: Secondary | ICD-10-CM | POA: Diagnosis not present

## 2021-09-20 DIAGNOSIS — D631 Anemia in chronic kidney disease: Secondary | ICD-10-CM | POA: Diagnosis not present

## 2021-09-22 DIAGNOSIS — N2581 Secondary hyperparathyroidism of renal origin: Secondary | ICD-10-CM | POA: Diagnosis not present

## 2021-09-22 DIAGNOSIS — Z992 Dependence on renal dialysis: Secondary | ICD-10-CM | POA: Diagnosis not present

## 2021-09-22 DIAGNOSIS — N186 End stage renal disease: Secondary | ICD-10-CM | POA: Diagnosis not present

## 2021-09-22 DIAGNOSIS — D631 Anemia in chronic kidney disease: Secondary | ICD-10-CM | POA: Diagnosis not present

## 2021-09-25 DIAGNOSIS — N186 End stage renal disease: Secondary | ICD-10-CM | POA: Diagnosis not present

## 2021-09-25 DIAGNOSIS — Z992 Dependence on renal dialysis: Secondary | ICD-10-CM | POA: Diagnosis not present

## 2021-09-25 DIAGNOSIS — D631 Anemia in chronic kidney disease: Secondary | ICD-10-CM | POA: Diagnosis not present

## 2021-09-25 DIAGNOSIS — N2581 Secondary hyperparathyroidism of renal origin: Secondary | ICD-10-CM | POA: Diagnosis not present

## 2021-09-27 DIAGNOSIS — D631 Anemia in chronic kidney disease: Secondary | ICD-10-CM | POA: Diagnosis not present

## 2021-09-27 DIAGNOSIS — N2581 Secondary hyperparathyroidism of renal origin: Secondary | ICD-10-CM | POA: Diagnosis not present

## 2021-09-27 DIAGNOSIS — N186 End stage renal disease: Secondary | ICD-10-CM | POA: Diagnosis not present

## 2021-09-27 DIAGNOSIS — Z992 Dependence on renal dialysis: Secondary | ICD-10-CM | POA: Diagnosis not present

## 2021-09-29 DIAGNOSIS — N186 End stage renal disease: Secondary | ICD-10-CM | POA: Diagnosis not present

## 2021-09-29 DIAGNOSIS — N2581 Secondary hyperparathyroidism of renal origin: Secondary | ICD-10-CM | POA: Diagnosis not present

## 2021-09-29 DIAGNOSIS — Z992 Dependence on renal dialysis: Secondary | ICD-10-CM | POA: Diagnosis not present

## 2021-09-29 DIAGNOSIS — D631 Anemia in chronic kidney disease: Secondary | ICD-10-CM | POA: Diagnosis not present

## 2021-10-02 DIAGNOSIS — D509 Iron deficiency anemia, unspecified: Secondary | ICD-10-CM | POA: Diagnosis not present

## 2021-10-02 DIAGNOSIS — D631 Anemia in chronic kidney disease: Secondary | ICD-10-CM | POA: Diagnosis not present

## 2021-10-02 DIAGNOSIS — N2581 Secondary hyperparathyroidism of renal origin: Secondary | ICD-10-CM | POA: Diagnosis not present

## 2021-10-02 DIAGNOSIS — Z992 Dependence on renal dialysis: Secondary | ICD-10-CM | POA: Diagnosis not present

## 2021-10-02 DIAGNOSIS — N186 End stage renal disease: Secondary | ICD-10-CM | POA: Diagnosis not present

## 2021-10-04 DIAGNOSIS — Z992 Dependence on renal dialysis: Secondary | ICD-10-CM | POA: Diagnosis not present

## 2021-10-04 DIAGNOSIS — N186 End stage renal disease: Secondary | ICD-10-CM | POA: Diagnosis not present

## 2021-10-04 DIAGNOSIS — D509 Iron deficiency anemia, unspecified: Secondary | ICD-10-CM | POA: Diagnosis not present

## 2021-10-04 DIAGNOSIS — D631 Anemia in chronic kidney disease: Secondary | ICD-10-CM | POA: Diagnosis not present

## 2021-10-04 DIAGNOSIS — N2581 Secondary hyperparathyroidism of renal origin: Secondary | ICD-10-CM | POA: Diagnosis not present

## 2021-10-04 DIAGNOSIS — E1129 Type 2 diabetes mellitus with other diabetic kidney complication: Secondary | ICD-10-CM | POA: Diagnosis not present

## 2021-10-06 DIAGNOSIS — N186 End stage renal disease: Secondary | ICD-10-CM | POA: Diagnosis not present

## 2021-10-06 DIAGNOSIS — N2581 Secondary hyperparathyroidism of renal origin: Secondary | ICD-10-CM | POA: Diagnosis not present

## 2021-10-06 DIAGNOSIS — D509 Iron deficiency anemia, unspecified: Secondary | ICD-10-CM | POA: Diagnosis not present

## 2021-10-06 DIAGNOSIS — D631 Anemia in chronic kidney disease: Secondary | ICD-10-CM | POA: Diagnosis not present

## 2021-10-06 DIAGNOSIS — Z992 Dependence on renal dialysis: Secondary | ICD-10-CM | POA: Diagnosis not present

## 2021-10-09 DIAGNOSIS — D631 Anemia in chronic kidney disease: Secondary | ICD-10-CM | POA: Diagnosis not present

## 2021-10-09 DIAGNOSIS — D509 Iron deficiency anemia, unspecified: Secondary | ICD-10-CM | POA: Diagnosis not present

## 2021-10-09 DIAGNOSIS — N186 End stage renal disease: Secondary | ICD-10-CM | POA: Diagnosis not present

## 2021-10-09 DIAGNOSIS — Z992 Dependence on renal dialysis: Secondary | ICD-10-CM | POA: Diagnosis not present

## 2021-10-09 DIAGNOSIS — N2581 Secondary hyperparathyroidism of renal origin: Secondary | ICD-10-CM | POA: Diagnosis not present

## 2021-10-11 DIAGNOSIS — Z992 Dependence on renal dialysis: Secondary | ICD-10-CM | POA: Diagnosis not present

## 2021-10-11 DIAGNOSIS — D631 Anemia in chronic kidney disease: Secondary | ICD-10-CM | POA: Diagnosis not present

## 2021-10-11 DIAGNOSIS — D509 Iron deficiency anemia, unspecified: Secondary | ICD-10-CM | POA: Diagnosis not present

## 2021-10-11 DIAGNOSIS — N2581 Secondary hyperparathyroidism of renal origin: Secondary | ICD-10-CM | POA: Diagnosis not present

## 2021-10-11 DIAGNOSIS — N186 End stage renal disease: Secondary | ICD-10-CM | POA: Diagnosis not present

## 2021-10-13 DIAGNOSIS — D631 Anemia in chronic kidney disease: Secondary | ICD-10-CM | POA: Diagnosis not present

## 2021-10-13 DIAGNOSIS — N186 End stage renal disease: Secondary | ICD-10-CM | POA: Diagnosis not present

## 2021-10-13 DIAGNOSIS — N2581 Secondary hyperparathyroidism of renal origin: Secondary | ICD-10-CM | POA: Diagnosis not present

## 2021-10-13 DIAGNOSIS — D509 Iron deficiency anemia, unspecified: Secondary | ICD-10-CM | POA: Diagnosis not present

## 2021-10-13 DIAGNOSIS — Z992 Dependence on renal dialysis: Secondary | ICD-10-CM | POA: Diagnosis not present

## 2021-10-16 DIAGNOSIS — D509 Iron deficiency anemia, unspecified: Secondary | ICD-10-CM | POA: Diagnosis not present

## 2021-10-16 DIAGNOSIS — N2581 Secondary hyperparathyroidism of renal origin: Secondary | ICD-10-CM | POA: Diagnosis not present

## 2021-10-16 DIAGNOSIS — N186 End stage renal disease: Secondary | ICD-10-CM | POA: Diagnosis not present

## 2021-10-16 DIAGNOSIS — D631 Anemia in chronic kidney disease: Secondary | ICD-10-CM | POA: Diagnosis not present

## 2021-10-16 DIAGNOSIS — Z992 Dependence on renal dialysis: Secondary | ICD-10-CM | POA: Diagnosis not present

## 2021-10-17 DIAGNOSIS — Z299 Encounter for prophylactic measures, unspecified: Secondary | ICD-10-CM | POA: Diagnosis not present

## 2021-10-17 DIAGNOSIS — N186 End stage renal disease: Secondary | ICD-10-CM | POA: Diagnosis not present

## 2021-10-17 DIAGNOSIS — E1165 Type 2 diabetes mellitus with hyperglycemia: Secondary | ICD-10-CM | POA: Diagnosis not present

## 2021-10-17 DIAGNOSIS — I10 Essential (primary) hypertension: Secondary | ICD-10-CM | POA: Diagnosis not present

## 2021-10-17 DIAGNOSIS — Z992 Dependence on renal dialysis: Secondary | ICD-10-CM | POA: Diagnosis not present

## 2021-10-18 DIAGNOSIS — Z992 Dependence on renal dialysis: Secondary | ICD-10-CM | POA: Diagnosis not present

## 2021-10-18 DIAGNOSIS — N186 End stage renal disease: Secondary | ICD-10-CM | POA: Diagnosis not present

## 2021-10-18 DIAGNOSIS — D509 Iron deficiency anemia, unspecified: Secondary | ICD-10-CM | POA: Diagnosis not present

## 2021-10-18 DIAGNOSIS — N2581 Secondary hyperparathyroidism of renal origin: Secondary | ICD-10-CM | POA: Diagnosis not present

## 2021-10-18 DIAGNOSIS — D631 Anemia in chronic kidney disease: Secondary | ICD-10-CM | POA: Diagnosis not present

## 2021-10-20 DIAGNOSIS — Z992 Dependence on renal dialysis: Secondary | ICD-10-CM | POA: Diagnosis not present

## 2021-10-20 DIAGNOSIS — N2581 Secondary hyperparathyroidism of renal origin: Secondary | ICD-10-CM | POA: Diagnosis not present

## 2021-10-20 DIAGNOSIS — D509 Iron deficiency anemia, unspecified: Secondary | ICD-10-CM | POA: Diagnosis not present

## 2021-10-20 DIAGNOSIS — D631 Anemia in chronic kidney disease: Secondary | ICD-10-CM | POA: Diagnosis not present

## 2021-10-20 DIAGNOSIS — N186 End stage renal disease: Secondary | ICD-10-CM | POA: Diagnosis not present

## 2021-10-23 DIAGNOSIS — D631 Anemia in chronic kidney disease: Secondary | ICD-10-CM | POA: Diagnosis not present

## 2021-10-23 DIAGNOSIS — N2581 Secondary hyperparathyroidism of renal origin: Secondary | ICD-10-CM | POA: Diagnosis not present

## 2021-10-23 DIAGNOSIS — D509 Iron deficiency anemia, unspecified: Secondary | ICD-10-CM | POA: Diagnosis not present

## 2021-10-23 DIAGNOSIS — Z992 Dependence on renal dialysis: Secondary | ICD-10-CM | POA: Diagnosis not present

## 2021-10-23 DIAGNOSIS — N186 End stage renal disease: Secondary | ICD-10-CM | POA: Diagnosis not present

## 2021-10-25 DIAGNOSIS — D631 Anemia in chronic kidney disease: Secondary | ICD-10-CM | POA: Diagnosis not present

## 2021-10-25 DIAGNOSIS — N2581 Secondary hyperparathyroidism of renal origin: Secondary | ICD-10-CM | POA: Diagnosis not present

## 2021-10-25 DIAGNOSIS — N186 End stage renal disease: Secondary | ICD-10-CM | POA: Diagnosis not present

## 2021-10-25 DIAGNOSIS — D509 Iron deficiency anemia, unspecified: Secondary | ICD-10-CM | POA: Diagnosis not present

## 2021-10-25 DIAGNOSIS — Z992 Dependence on renal dialysis: Secondary | ICD-10-CM | POA: Diagnosis not present

## 2021-10-27 DIAGNOSIS — N2581 Secondary hyperparathyroidism of renal origin: Secondary | ICD-10-CM | POA: Diagnosis not present

## 2021-10-27 DIAGNOSIS — D631 Anemia in chronic kidney disease: Secondary | ICD-10-CM | POA: Diagnosis not present

## 2021-10-27 DIAGNOSIS — N186 End stage renal disease: Secondary | ICD-10-CM | POA: Diagnosis not present

## 2021-10-27 DIAGNOSIS — D509 Iron deficiency anemia, unspecified: Secondary | ICD-10-CM | POA: Diagnosis not present

## 2021-10-27 DIAGNOSIS — Z992 Dependence on renal dialysis: Secondary | ICD-10-CM | POA: Diagnosis not present

## 2021-10-30 DIAGNOSIS — D509 Iron deficiency anemia, unspecified: Secondary | ICD-10-CM | POA: Diagnosis not present

## 2021-10-30 DIAGNOSIS — D631 Anemia in chronic kidney disease: Secondary | ICD-10-CM | POA: Diagnosis not present

## 2021-10-30 DIAGNOSIS — N2581 Secondary hyperparathyroidism of renal origin: Secondary | ICD-10-CM | POA: Diagnosis not present

## 2021-10-30 DIAGNOSIS — N186 End stage renal disease: Secondary | ICD-10-CM | POA: Diagnosis not present

## 2021-10-30 DIAGNOSIS — Z992 Dependence on renal dialysis: Secondary | ICD-10-CM | POA: Diagnosis not present

## 2021-11-01 DIAGNOSIS — D509 Iron deficiency anemia, unspecified: Secondary | ICD-10-CM | POA: Diagnosis not present

## 2021-11-01 DIAGNOSIS — N2581 Secondary hyperparathyroidism of renal origin: Secondary | ICD-10-CM | POA: Diagnosis not present

## 2021-11-01 DIAGNOSIS — Z992 Dependence on renal dialysis: Secondary | ICD-10-CM | POA: Diagnosis not present

## 2021-11-01 DIAGNOSIS — D631 Anemia in chronic kidney disease: Secondary | ICD-10-CM | POA: Diagnosis not present

## 2021-11-01 DIAGNOSIS — N186 End stage renal disease: Secondary | ICD-10-CM | POA: Diagnosis not present

## 2021-11-03 DIAGNOSIS — N2581 Secondary hyperparathyroidism of renal origin: Secondary | ICD-10-CM | POA: Diagnosis not present

## 2021-11-03 DIAGNOSIS — N186 End stage renal disease: Secondary | ICD-10-CM | POA: Diagnosis not present

## 2021-11-03 DIAGNOSIS — D631 Anemia in chronic kidney disease: Secondary | ICD-10-CM | POA: Diagnosis not present

## 2021-11-03 DIAGNOSIS — D509 Iron deficiency anemia, unspecified: Secondary | ICD-10-CM | POA: Diagnosis not present

## 2021-11-03 DIAGNOSIS — Z992 Dependence on renal dialysis: Secondary | ICD-10-CM | POA: Diagnosis not present

## 2021-11-06 DIAGNOSIS — D509 Iron deficiency anemia, unspecified: Secondary | ICD-10-CM | POA: Diagnosis not present

## 2021-11-06 DIAGNOSIS — D631 Anemia in chronic kidney disease: Secondary | ICD-10-CM | POA: Diagnosis not present

## 2021-11-06 DIAGNOSIS — Z992 Dependence on renal dialysis: Secondary | ICD-10-CM | POA: Diagnosis not present

## 2021-11-06 DIAGNOSIS — N186 End stage renal disease: Secondary | ICD-10-CM | POA: Diagnosis not present

## 2021-11-06 DIAGNOSIS — N2581 Secondary hyperparathyroidism of renal origin: Secondary | ICD-10-CM | POA: Diagnosis not present

## 2021-11-08 DIAGNOSIS — D631 Anemia in chronic kidney disease: Secondary | ICD-10-CM | POA: Diagnosis not present

## 2021-11-08 DIAGNOSIS — D509 Iron deficiency anemia, unspecified: Secondary | ICD-10-CM | POA: Diagnosis not present

## 2021-11-08 DIAGNOSIS — Z992 Dependence on renal dialysis: Secondary | ICD-10-CM | POA: Diagnosis not present

## 2021-11-08 DIAGNOSIS — N2581 Secondary hyperparathyroidism of renal origin: Secondary | ICD-10-CM | POA: Diagnosis not present

## 2021-11-08 DIAGNOSIS — N186 End stage renal disease: Secondary | ICD-10-CM | POA: Diagnosis not present

## 2021-11-10 DIAGNOSIS — D509 Iron deficiency anemia, unspecified: Secondary | ICD-10-CM | POA: Diagnosis not present

## 2021-11-10 DIAGNOSIS — N2581 Secondary hyperparathyroidism of renal origin: Secondary | ICD-10-CM | POA: Diagnosis not present

## 2021-11-10 DIAGNOSIS — Z992 Dependence on renal dialysis: Secondary | ICD-10-CM | POA: Diagnosis not present

## 2021-11-10 DIAGNOSIS — D631 Anemia in chronic kidney disease: Secondary | ICD-10-CM | POA: Diagnosis not present

## 2021-11-10 DIAGNOSIS — N186 End stage renal disease: Secondary | ICD-10-CM | POA: Diagnosis not present

## 2021-11-13 DIAGNOSIS — Z992 Dependence on renal dialysis: Secondary | ICD-10-CM | POA: Diagnosis not present

## 2021-11-13 DIAGNOSIS — D509 Iron deficiency anemia, unspecified: Secondary | ICD-10-CM | POA: Diagnosis not present

## 2021-11-13 DIAGNOSIS — N2581 Secondary hyperparathyroidism of renal origin: Secondary | ICD-10-CM | POA: Diagnosis not present

## 2021-11-13 DIAGNOSIS — D631 Anemia in chronic kidney disease: Secondary | ICD-10-CM | POA: Diagnosis not present

## 2021-11-13 DIAGNOSIS — N186 End stage renal disease: Secondary | ICD-10-CM | POA: Diagnosis not present

## 2021-11-15 DIAGNOSIS — N186 End stage renal disease: Secondary | ICD-10-CM | POA: Diagnosis not present

## 2021-11-15 DIAGNOSIS — D631 Anemia in chronic kidney disease: Secondary | ICD-10-CM | POA: Diagnosis not present

## 2021-11-15 DIAGNOSIS — N2581 Secondary hyperparathyroidism of renal origin: Secondary | ICD-10-CM | POA: Diagnosis not present

## 2021-11-15 DIAGNOSIS — D509 Iron deficiency anemia, unspecified: Secondary | ICD-10-CM | POA: Diagnosis not present

## 2021-11-15 DIAGNOSIS — Z992 Dependence on renal dialysis: Secondary | ICD-10-CM | POA: Diagnosis not present

## 2021-11-17 DIAGNOSIS — D631 Anemia in chronic kidney disease: Secondary | ICD-10-CM | POA: Diagnosis not present

## 2021-11-17 DIAGNOSIS — N186 End stage renal disease: Secondary | ICD-10-CM | POA: Diagnosis not present

## 2021-11-17 DIAGNOSIS — D509 Iron deficiency anemia, unspecified: Secondary | ICD-10-CM | POA: Diagnosis not present

## 2021-11-17 DIAGNOSIS — Z992 Dependence on renal dialysis: Secondary | ICD-10-CM | POA: Diagnosis not present

## 2021-11-17 DIAGNOSIS — N2581 Secondary hyperparathyroidism of renal origin: Secondary | ICD-10-CM | POA: Diagnosis not present

## 2021-11-20 DIAGNOSIS — N2581 Secondary hyperparathyroidism of renal origin: Secondary | ICD-10-CM | POA: Diagnosis not present

## 2021-11-20 DIAGNOSIS — D509 Iron deficiency anemia, unspecified: Secondary | ICD-10-CM | POA: Diagnosis not present

## 2021-11-20 DIAGNOSIS — Z992 Dependence on renal dialysis: Secondary | ICD-10-CM | POA: Diagnosis not present

## 2021-11-20 DIAGNOSIS — D631 Anemia in chronic kidney disease: Secondary | ICD-10-CM | POA: Diagnosis not present

## 2021-11-20 DIAGNOSIS — N186 End stage renal disease: Secondary | ICD-10-CM | POA: Diagnosis not present

## 2021-11-22 DIAGNOSIS — D631 Anemia in chronic kidney disease: Secondary | ICD-10-CM | POA: Diagnosis not present

## 2021-11-22 DIAGNOSIS — D509 Iron deficiency anemia, unspecified: Secondary | ICD-10-CM | POA: Diagnosis not present

## 2021-11-22 DIAGNOSIS — N186 End stage renal disease: Secondary | ICD-10-CM | POA: Diagnosis not present

## 2021-11-22 DIAGNOSIS — Z992 Dependence on renal dialysis: Secondary | ICD-10-CM | POA: Diagnosis not present

## 2021-11-22 DIAGNOSIS — N2581 Secondary hyperparathyroidism of renal origin: Secondary | ICD-10-CM | POA: Diagnosis not present

## 2021-11-24 DIAGNOSIS — Z992 Dependence on renal dialysis: Secondary | ICD-10-CM | POA: Diagnosis not present

## 2021-11-24 DIAGNOSIS — N186 End stage renal disease: Secondary | ICD-10-CM | POA: Diagnosis not present

## 2021-11-24 DIAGNOSIS — D509 Iron deficiency anemia, unspecified: Secondary | ICD-10-CM | POA: Diagnosis not present

## 2021-11-24 DIAGNOSIS — N2581 Secondary hyperparathyroidism of renal origin: Secondary | ICD-10-CM | POA: Diagnosis not present

## 2021-11-24 DIAGNOSIS — D631 Anemia in chronic kidney disease: Secondary | ICD-10-CM | POA: Diagnosis not present

## 2021-11-27 DIAGNOSIS — N2581 Secondary hyperparathyroidism of renal origin: Secondary | ICD-10-CM | POA: Diagnosis not present

## 2021-11-27 DIAGNOSIS — D631 Anemia in chronic kidney disease: Secondary | ICD-10-CM | POA: Diagnosis not present

## 2021-11-27 DIAGNOSIS — D509 Iron deficiency anemia, unspecified: Secondary | ICD-10-CM | POA: Diagnosis not present

## 2021-11-27 DIAGNOSIS — Z992 Dependence on renal dialysis: Secondary | ICD-10-CM | POA: Diagnosis not present

## 2021-11-27 DIAGNOSIS — N186 End stage renal disease: Secondary | ICD-10-CM | POA: Diagnosis not present

## 2021-11-28 DIAGNOSIS — H26491 Other secondary cataract, right eye: Secondary | ICD-10-CM | POA: Diagnosis not present

## 2021-11-28 DIAGNOSIS — E113393 Type 2 diabetes mellitus with moderate nonproliferative diabetic retinopathy without macular edema, bilateral: Secondary | ICD-10-CM | POA: Diagnosis not present

## 2021-11-29 DIAGNOSIS — D509 Iron deficiency anemia, unspecified: Secondary | ICD-10-CM | POA: Diagnosis not present

## 2021-11-29 DIAGNOSIS — N2581 Secondary hyperparathyroidism of renal origin: Secondary | ICD-10-CM | POA: Diagnosis not present

## 2021-11-29 DIAGNOSIS — Z992 Dependence on renal dialysis: Secondary | ICD-10-CM | POA: Diagnosis not present

## 2021-11-29 DIAGNOSIS — D631 Anemia in chronic kidney disease: Secondary | ICD-10-CM | POA: Diagnosis not present

## 2021-11-29 DIAGNOSIS — N186 End stage renal disease: Secondary | ICD-10-CM | POA: Diagnosis not present

## 2021-11-30 DIAGNOSIS — N186 End stage renal disease: Secondary | ICD-10-CM | POA: Diagnosis not present

## 2021-11-30 DIAGNOSIS — Z992 Dependence on renal dialysis: Secondary | ICD-10-CM | POA: Diagnosis not present

## 2021-12-01 DIAGNOSIS — N186 End stage renal disease: Secondary | ICD-10-CM | POA: Diagnosis not present

## 2021-12-01 DIAGNOSIS — D631 Anemia in chronic kidney disease: Secondary | ICD-10-CM | POA: Diagnosis not present

## 2021-12-01 DIAGNOSIS — N2581 Secondary hyperparathyroidism of renal origin: Secondary | ICD-10-CM | POA: Diagnosis not present

## 2021-12-01 DIAGNOSIS — Z992 Dependence on renal dialysis: Secondary | ICD-10-CM | POA: Diagnosis not present

## 2021-12-01 DIAGNOSIS — D509 Iron deficiency anemia, unspecified: Secondary | ICD-10-CM | POA: Diagnosis not present

## 2021-12-04 DIAGNOSIS — N186 End stage renal disease: Secondary | ICD-10-CM | POA: Diagnosis not present

## 2021-12-04 DIAGNOSIS — Z992 Dependence on renal dialysis: Secondary | ICD-10-CM | POA: Diagnosis not present

## 2021-12-04 DIAGNOSIS — D509 Iron deficiency anemia, unspecified: Secondary | ICD-10-CM | POA: Diagnosis not present

## 2021-12-04 DIAGNOSIS — N2581 Secondary hyperparathyroidism of renal origin: Secondary | ICD-10-CM | POA: Diagnosis not present

## 2021-12-04 DIAGNOSIS — D631 Anemia in chronic kidney disease: Secondary | ICD-10-CM | POA: Diagnosis not present

## 2021-12-06 DIAGNOSIS — D631 Anemia in chronic kidney disease: Secondary | ICD-10-CM | POA: Diagnosis not present

## 2021-12-06 DIAGNOSIS — N2581 Secondary hyperparathyroidism of renal origin: Secondary | ICD-10-CM | POA: Diagnosis not present

## 2021-12-06 DIAGNOSIS — D509 Iron deficiency anemia, unspecified: Secondary | ICD-10-CM | POA: Diagnosis not present

## 2021-12-06 DIAGNOSIS — Z992 Dependence on renal dialysis: Secondary | ICD-10-CM | POA: Diagnosis not present

## 2021-12-06 DIAGNOSIS — N186 End stage renal disease: Secondary | ICD-10-CM | POA: Diagnosis not present

## 2021-12-08 DIAGNOSIS — N186 End stage renal disease: Secondary | ICD-10-CM | POA: Diagnosis not present

## 2021-12-08 DIAGNOSIS — N2581 Secondary hyperparathyroidism of renal origin: Secondary | ICD-10-CM | POA: Diagnosis not present

## 2021-12-08 DIAGNOSIS — D509 Iron deficiency anemia, unspecified: Secondary | ICD-10-CM | POA: Diagnosis not present

## 2021-12-08 DIAGNOSIS — Z992 Dependence on renal dialysis: Secondary | ICD-10-CM | POA: Diagnosis not present

## 2021-12-08 DIAGNOSIS — D631 Anemia in chronic kidney disease: Secondary | ICD-10-CM | POA: Diagnosis not present

## 2021-12-11 DIAGNOSIS — D631 Anemia in chronic kidney disease: Secondary | ICD-10-CM | POA: Diagnosis not present

## 2021-12-11 DIAGNOSIS — D509 Iron deficiency anemia, unspecified: Secondary | ICD-10-CM | POA: Diagnosis not present

## 2021-12-11 DIAGNOSIS — N186 End stage renal disease: Secondary | ICD-10-CM | POA: Diagnosis not present

## 2021-12-11 DIAGNOSIS — Z992 Dependence on renal dialysis: Secondary | ICD-10-CM | POA: Diagnosis not present

## 2021-12-11 DIAGNOSIS — N2581 Secondary hyperparathyroidism of renal origin: Secondary | ICD-10-CM | POA: Diagnosis not present

## 2021-12-13 DIAGNOSIS — D509 Iron deficiency anemia, unspecified: Secondary | ICD-10-CM | POA: Diagnosis not present

## 2021-12-13 DIAGNOSIS — N186 End stage renal disease: Secondary | ICD-10-CM | POA: Diagnosis not present

## 2021-12-13 DIAGNOSIS — D631 Anemia in chronic kidney disease: Secondary | ICD-10-CM | POA: Diagnosis not present

## 2021-12-13 DIAGNOSIS — N2581 Secondary hyperparathyroidism of renal origin: Secondary | ICD-10-CM | POA: Diagnosis not present

## 2021-12-13 DIAGNOSIS — Z992 Dependence on renal dialysis: Secondary | ICD-10-CM | POA: Diagnosis not present

## 2021-12-14 DIAGNOSIS — M79671 Pain in right foot: Secondary | ICD-10-CM | POA: Diagnosis not present

## 2021-12-14 DIAGNOSIS — Z992 Dependence on renal dialysis: Secondary | ICD-10-CM | POA: Diagnosis not present

## 2021-12-14 DIAGNOSIS — I739 Peripheral vascular disease, unspecified: Secondary | ICD-10-CM | POA: Diagnosis not present

## 2021-12-14 DIAGNOSIS — E1140 Type 2 diabetes mellitus with diabetic neuropathy, unspecified: Secondary | ICD-10-CM | POA: Diagnosis not present

## 2021-12-15 DIAGNOSIS — D509 Iron deficiency anemia, unspecified: Secondary | ICD-10-CM | POA: Diagnosis not present

## 2021-12-15 DIAGNOSIS — N2581 Secondary hyperparathyroidism of renal origin: Secondary | ICD-10-CM | POA: Diagnosis not present

## 2021-12-15 DIAGNOSIS — D631 Anemia in chronic kidney disease: Secondary | ICD-10-CM | POA: Diagnosis not present

## 2021-12-15 DIAGNOSIS — Z992 Dependence on renal dialysis: Secondary | ICD-10-CM | POA: Diagnosis not present

## 2021-12-15 DIAGNOSIS — N186 End stage renal disease: Secondary | ICD-10-CM | POA: Diagnosis not present

## 2021-12-18 DIAGNOSIS — D509 Iron deficiency anemia, unspecified: Secondary | ICD-10-CM | POA: Diagnosis not present

## 2021-12-18 DIAGNOSIS — N186 End stage renal disease: Secondary | ICD-10-CM | POA: Diagnosis not present

## 2021-12-18 DIAGNOSIS — N2581 Secondary hyperparathyroidism of renal origin: Secondary | ICD-10-CM | POA: Diagnosis not present

## 2021-12-18 DIAGNOSIS — Z992 Dependence on renal dialysis: Secondary | ICD-10-CM | POA: Diagnosis not present

## 2021-12-18 DIAGNOSIS — D631 Anemia in chronic kidney disease: Secondary | ICD-10-CM | POA: Diagnosis not present

## 2021-12-20 DIAGNOSIS — N186 End stage renal disease: Secondary | ICD-10-CM | POA: Diagnosis not present

## 2021-12-20 DIAGNOSIS — N2581 Secondary hyperparathyroidism of renal origin: Secondary | ICD-10-CM | POA: Diagnosis not present

## 2021-12-20 DIAGNOSIS — D509 Iron deficiency anemia, unspecified: Secondary | ICD-10-CM | POA: Diagnosis not present

## 2021-12-20 DIAGNOSIS — Z992 Dependence on renal dialysis: Secondary | ICD-10-CM | POA: Diagnosis not present

## 2021-12-20 DIAGNOSIS — D631 Anemia in chronic kidney disease: Secondary | ICD-10-CM | POA: Diagnosis not present

## 2021-12-22 DIAGNOSIS — N2581 Secondary hyperparathyroidism of renal origin: Secondary | ICD-10-CM | POA: Diagnosis not present

## 2021-12-22 DIAGNOSIS — D631 Anemia in chronic kidney disease: Secondary | ICD-10-CM | POA: Diagnosis not present

## 2021-12-22 DIAGNOSIS — Z992 Dependence on renal dialysis: Secondary | ICD-10-CM | POA: Diagnosis not present

## 2021-12-22 DIAGNOSIS — D509 Iron deficiency anemia, unspecified: Secondary | ICD-10-CM | POA: Diagnosis not present

## 2021-12-22 DIAGNOSIS — N186 End stage renal disease: Secondary | ICD-10-CM | POA: Diagnosis not present

## 2021-12-25 DIAGNOSIS — D631 Anemia in chronic kidney disease: Secondary | ICD-10-CM | POA: Diagnosis not present

## 2021-12-25 DIAGNOSIS — Z992 Dependence on renal dialysis: Secondary | ICD-10-CM | POA: Diagnosis not present

## 2021-12-25 DIAGNOSIS — D509 Iron deficiency anemia, unspecified: Secondary | ICD-10-CM | POA: Diagnosis not present

## 2021-12-25 DIAGNOSIS — N186 End stage renal disease: Secondary | ICD-10-CM | POA: Diagnosis not present

## 2021-12-25 DIAGNOSIS — N2581 Secondary hyperparathyroidism of renal origin: Secondary | ICD-10-CM | POA: Diagnosis not present

## 2021-12-27 DIAGNOSIS — Z992 Dependence on renal dialysis: Secondary | ICD-10-CM | POA: Diagnosis not present

## 2021-12-27 DIAGNOSIS — N186 End stage renal disease: Secondary | ICD-10-CM | POA: Diagnosis not present

## 2021-12-27 DIAGNOSIS — D509 Iron deficiency anemia, unspecified: Secondary | ICD-10-CM | POA: Diagnosis not present

## 2021-12-27 DIAGNOSIS — N2581 Secondary hyperparathyroidism of renal origin: Secondary | ICD-10-CM | POA: Diagnosis not present

## 2021-12-27 DIAGNOSIS — D631 Anemia in chronic kidney disease: Secondary | ICD-10-CM | POA: Diagnosis not present

## 2021-12-29 DIAGNOSIS — D631 Anemia in chronic kidney disease: Secondary | ICD-10-CM | POA: Diagnosis not present

## 2021-12-29 DIAGNOSIS — N2581 Secondary hyperparathyroidism of renal origin: Secondary | ICD-10-CM | POA: Diagnosis not present

## 2021-12-29 DIAGNOSIS — D509 Iron deficiency anemia, unspecified: Secondary | ICD-10-CM | POA: Diagnosis not present

## 2021-12-29 DIAGNOSIS — Z992 Dependence on renal dialysis: Secondary | ICD-10-CM | POA: Diagnosis not present

## 2021-12-29 DIAGNOSIS — N186 End stage renal disease: Secondary | ICD-10-CM | POA: Diagnosis not present

## 2021-12-30 DIAGNOSIS — Z992 Dependence on renal dialysis: Secondary | ICD-10-CM | POA: Diagnosis not present

## 2021-12-30 DIAGNOSIS — N186 End stage renal disease: Secondary | ICD-10-CM | POA: Diagnosis not present

## 2022-01-01 DIAGNOSIS — N2581 Secondary hyperparathyroidism of renal origin: Secondary | ICD-10-CM | POA: Diagnosis not present

## 2022-01-01 DIAGNOSIS — D509 Iron deficiency anemia, unspecified: Secondary | ICD-10-CM | POA: Diagnosis not present

## 2022-01-01 DIAGNOSIS — N186 End stage renal disease: Secondary | ICD-10-CM | POA: Diagnosis not present

## 2022-01-01 DIAGNOSIS — Z992 Dependence on renal dialysis: Secondary | ICD-10-CM | POA: Diagnosis not present

## 2022-01-01 DIAGNOSIS — D631 Anemia in chronic kidney disease: Secondary | ICD-10-CM | POA: Diagnosis not present

## 2022-01-01 DIAGNOSIS — E1129 Type 2 diabetes mellitus with other diabetic kidney complication: Secondary | ICD-10-CM | POA: Diagnosis not present

## 2022-01-03 DIAGNOSIS — N186 End stage renal disease: Secondary | ICD-10-CM | POA: Diagnosis not present

## 2022-01-03 DIAGNOSIS — Z992 Dependence on renal dialysis: Secondary | ICD-10-CM | POA: Diagnosis not present

## 2022-01-03 DIAGNOSIS — D509 Iron deficiency anemia, unspecified: Secondary | ICD-10-CM | POA: Diagnosis not present

## 2022-01-03 DIAGNOSIS — N2581 Secondary hyperparathyroidism of renal origin: Secondary | ICD-10-CM | POA: Diagnosis not present

## 2022-01-03 DIAGNOSIS — D631 Anemia in chronic kidney disease: Secondary | ICD-10-CM | POA: Diagnosis not present

## 2022-01-05 DIAGNOSIS — N186 End stage renal disease: Secondary | ICD-10-CM | POA: Diagnosis not present

## 2022-01-05 DIAGNOSIS — N2581 Secondary hyperparathyroidism of renal origin: Secondary | ICD-10-CM | POA: Diagnosis not present

## 2022-01-05 DIAGNOSIS — Z992 Dependence on renal dialysis: Secondary | ICD-10-CM | POA: Diagnosis not present

## 2022-01-05 DIAGNOSIS — D509 Iron deficiency anemia, unspecified: Secondary | ICD-10-CM | POA: Diagnosis not present

## 2022-01-05 DIAGNOSIS — D631 Anemia in chronic kidney disease: Secondary | ICD-10-CM | POA: Diagnosis not present

## 2022-01-08 DIAGNOSIS — D509 Iron deficiency anemia, unspecified: Secondary | ICD-10-CM | POA: Diagnosis not present

## 2022-01-08 DIAGNOSIS — Z992 Dependence on renal dialysis: Secondary | ICD-10-CM | POA: Diagnosis not present

## 2022-01-08 DIAGNOSIS — N2581 Secondary hyperparathyroidism of renal origin: Secondary | ICD-10-CM | POA: Diagnosis not present

## 2022-01-08 DIAGNOSIS — D631 Anemia in chronic kidney disease: Secondary | ICD-10-CM | POA: Diagnosis not present

## 2022-01-08 DIAGNOSIS — N186 End stage renal disease: Secondary | ICD-10-CM | POA: Diagnosis not present

## 2022-01-10 DIAGNOSIS — D509 Iron deficiency anemia, unspecified: Secondary | ICD-10-CM | POA: Diagnosis not present

## 2022-01-10 DIAGNOSIS — D631 Anemia in chronic kidney disease: Secondary | ICD-10-CM | POA: Diagnosis not present

## 2022-01-10 DIAGNOSIS — N2581 Secondary hyperparathyroidism of renal origin: Secondary | ICD-10-CM | POA: Diagnosis not present

## 2022-01-10 DIAGNOSIS — N186 End stage renal disease: Secondary | ICD-10-CM | POA: Diagnosis not present

## 2022-01-10 DIAGNOSIS — Z992 Dependence on renal dialysis: Secondary | ICD-10-CM | POA: Diagnosis not present

## 2022-01-12 DIAGNOSIS — D631 Anemia in chronic kidney disease: Secondary | ICD-10-CM | POA: Diagnosis not present

## 2022-01-12 DIAGNOSIS — N2581 Secondary hyperparathyroidism of renal origin: Secondary | ICD-10-CM | POA: Diagnosis not present

## 2022-01-12 DIAGNOSIS — N186 End stage renal disease: Secondary | ICD-10-CM | POA: Diagnosis not present

## 2022-01-12 DIAGNOSIS — D509 Iron deficiency anemia, unspecified: Secondary | ICD-10-CM | POA: Diagnosis not present

## 2022-01-12 DIAGNOSIS — Z992 Dependence on renal dialysis: Secondary | ICD-10-CM | POA: Diagnosis not present

## 2022-01-15 DIAGNOSIS — D631 Anemia in chronic kidney disease: Secondary | ICD-10-CM | POA: Diagnosis not present

## 2022-01-15 DIAGNOSIS — N2581 Secondary hyperparathyroidism of renal origin: Secondary | ICD-10-CM | POA: Diagnosis not present

## 2022-01-15 DIAGNOSIS — D509 Iron deficiency anemia, unspecified: Secondary | ICD-10-CM | POA: Diagnosis not present

## 2022-01-15 DIAGNOSIS — Z992 Dependence on renal dialysis: Secondary | ICD-10-CM | POA: Diagnosis not present

## 2022-01-15 DIAGNOSIS — N186 End stage renal disease: Secondary | ICD-10-CM | POA: Diagnosis not present

## 2022-01-16 DIAGNOSIS — Z23 Encounter for immunization: Secondary | ICD-10-CM | POA: Diagnosis not present

## 2022-01-16 DIAGNOSIS — E1165 Type 2 diabetes mellitus with hyperglycemia: Secondary | ICD-10-CM | POA: Diagnosis not present

## 2022-01-16 DIAGNOSIS — N186 End stage renal disease: Secondary | ICD-10-CM | POA: Diagnosis not present

## 2022-01-16 DIAGNOSIS — Z299 Encounter for prophylactic measures, unspecified: Secondary | ICD-10-CM | POA: Diagnosis not present

## 2022-01-16 DIAGNOSIS — Z6821 Body mass index (BMI) 21.0-21.9, adult: Secondary | ICD-10-CM | POA: Diagnosis not present

## 2022-01-16 DIAGNOSIS — Z992 Dependence on renal dialysis: Secondary | ICD-10-CM | POA: Diagnosis not present

## 2022-01-16 DIAGNOSIS — E7800 Pure hypercholesterolemia, unspecified: Secondary | ICD-10-CM | POA: Diagnosis not present

## 2022-01-17 DIAGNOSIS — D631 Anemia in chronic kidney disease: Secondary | ICD-10-CM | POA: Diagnosis not present

## 2022-01-17 DIAGNOSIS — Z992 Dependence on renal dialysis: Secondary | ICD-10-CM | POA: Diagnosis not present

## 2022-01-17 DIAGNOSIS — N2581 Secondary hyperparathyroidism of renal origin: Secondary | ICD-10-CM | POA: Diagnosis not present

## 2022-01-17 DIAGNOSIS — N186 End stage renal disease: Secondary | ICD-10-CM | POA: Diagnosis not present

## 2022-01-17 DIAGNOSIS — D509 Iron deficiency anemia, unspecified: Secondary | ICD-10-CM | POA: Diagnosis not present

## 2022-01-19 DIAGNOSIS — D509 Iron deficiency anemia, unspecified: Secondary | ICD-10-CM | POA: Diagnosis not present

## 2022-01-19 DIAGNOSIS — N2581 Secondary hyperparathyroidism of renal origin: Secondary | ICD-10-CM | POA: Diagnosis not present

## 2022-01-19 DIAGNOSIS — N186 End stage renal disease: Secondary | ICD-10-CM | POA: Diagnosis not present

## 2022-01-19 DIAGNOSIS — D631 Anemia in chronic kidney disease: Secondary | ICD-10-CM | POA: Diagnosis not present

## 2022-01-19 DIAGNOSIS — Z992 Dependence on renal dialysis: Secondary | ICD-10-CM | POA: Diagnosis not present

## 2022-01-22 DIAGNOSIS — D509 Iron deficiency anemia, unspecified: Secondary | ICD-10-CM | POA: Diagnosis not present

## 2022-01-22 DIAGNOSIS — Z992 Dependence on renal dialysis: Secondary | ICD-10-CM | POA: Diagnosis not present

## 2022-01-22 DIAGNOSIS — N186 End stage renal disease: Secondary | ICD-10-CM | POA: Diagnosis not present

## 2022-01-22 DIAGNOSIS — N2581 Secondary hyperparathyroidism of renal origin: Secondary | ICD-10-CM | POA: Diagnosis not present

## 2022-01-22 DIAGNOSIS — D631 Anemia in chronic kidney disease: Secondary | ICD-10-CM | POA: Diagnosis not present

## 2022-01-24 DIAGNOSIS — D631 Anemia in chronic kidney disease: Secondary | ICD-10-CM | POA: Diagnosis not present

## 2022-01-24 DIAGNOSIS — D509 Iron deficiency anemia, unspecified: Secondary | ICD-10-CM | POA: Diagnosis not present

## 2022-01-24 DIAGNOSIS — N186 End stage renal disease: Secondary | ICD-10-CM | POA: Diagnosis not present

## 2022-01-24 DIAGNOSIS — Z992 Dependence on renal dialysis: Secondary | ICD-10-CM | POA: Diagnosis not present

## 2022-01-24 DIAGNOSIS — N2581 Secondary hyperparathyroidism of renal origin: Secondary | ICD-10-CM | POA: Diagnosis not present

## 2022-01-26 DIAGNOSIS — Z992 Dependence on renal dialysis: Secondary | ICD-10-CM | POA: Diagnosis not present

## 2022-01-26 DIAGNOSIS — D509 Iron deficiency anemia, unspecified: Secondary | ICD-10-CM | POA: Diagnosis not present

## 2022-01-26 DIAGNOSIS — N186 End stage renal disease: Secondary | ICD-10-CM | POA: Diagnosis not present

## 2022-01-26 DIAGNOSIS — D631 Anemia in chronic kidney disease: Secondary | ICD-10-CM | POA: Diagnosis not present

## 2022-01-26 DIAGNOSIS — N2581 Secondary hyperparathyroidism of renal origin: Secondary | ICD-10-CM | POA: Diagnosis not present

## 2022-01-29 DIAGNOSIS — N186 End stage renal disease: Secondary | ICD-10-CM | POA: Diagnosis not present

## 2022-01-29 DIAGNOSIS — D631 Anemia in chronic kidney disease: Secondary | ICD-10-CM | POA: Diagnosis not present

## 2022-01-29 DIAGNOSIS — N2581 Secondary hyperparathyroidism of renal origin: Secondary | ICD-10-CM | POA: Diagnosis not present

## 2022-01-29 DIAGNOSIS — Z992 Dependence on renal dialysis: Secondary | ICD-10-CM | POA: Diagnosis not present

## 2022-01-29 DIAGNOSIS — D509 Iron deficiency anemia, unspecified: Secondary | ICD-10-CM | POA: Diagnosis not present

## 2022-01-30 DIAGNOSIS — N186 End stage renal disease: Secondary | ICD-10-CM | POA: Diagnosis not present

## 2022-01-30 DIAGNOSIS — Z992 Dependence on renal dialysis: Secondary | ICD-10-CM | POA: Diagnosis not present

## 2022-01-31 DIAGNOSIS — Z992 Dependence on renal dialysis: Secondary | ICD-10-CM | POA: Diagnosis not present

## 2022-01-31 DIAGNOSIS — D509 Iron deficiency anemia, unspecified: Secondary | ICD-10-CM | POA: Diagnosis not present

## 2022-01-31 DIAGNOSIS — N2581 Secondary hyperparathyroidism of renal origin: Secondary | ICD-10-CM | POA: Diagnosis not present

## 2022-01-31 DIAGNOSIS — D631 Anemia in chronic kidney disease: Secondary | ICD-10-CM | POA: Diagnosis not present

## 2022-01-31 DIAGNOSIS — N186 End stage renal disease: Secondary | ICD-10-CM | POA: Diagnosis not present

## 2022-02-02 DIAGNOSIS — Z992 Dependence on renal dialysis: Secondary | ICD-10-CM | POA: Diagnosis not present

## 2022-02-02 DIAGNOSIS — D509 Iron deficiency anemia, unspecified: Secondary | ICD-10-CM | POA: Diagnosis not present

## 2022-02-02 DIAGNOSIS — N186 End stage renal disease: Secondary | ICD-10-CM | POA: Diagnosis not present

## 2022-02-02 DIAGNOSIS — D631 Anemia in chronic kidney disease: Secondary | ICD-10-CM | POA: Diagnosis not present

## 2022-02-02 DIAGNOSIS — N2581 Secondary hyperparathyroidism of renal origin: Secondary | ICD-10-CM | POA: Diagnosis not present

## 2022-02-05 DIAGNOSIS — D509 Iron deficiency anemia, unspecified: Secondary | ICD-10-CM | POA: Diagnosis not present

## 2022-02-05 DIAGNOSIS — Z992 Dependence on renal dialysis: Secondary | ICD-10-CM | POA: Diagnosis not present

## 2022-02-05 DIAGNOSIS — N2581 Secondary hyperparathyroidism of renal origin: Secondary | ICD-10-CM | POA: Diagnosis not present

## 2022-02-05 DIAGNOSIS — D631 Anemia in chronic kidney disease: Secondary | ICD-10-CM | POA: Diagnosis not present

## 2022-02-05 DIAGNOSIS — N186 End stage renal disease: Secondary | ICD-10-CM | POA: Diagnosis not present

## 2022-02-07 DIAGNOSIS — N2581 Secondary hyperparathyroidism of renal origin: Secondary | ICD-10-CM | POA: Diagnosis not present

## 2022-02-07 DIAGNOSIS — Z992 Dependence on renal dialysis: Secondary | ICD-10-CM | POA: Diagnosis not present

## 2022-02-07 DIAGNOSIS — D631 Anemia in chronic kidney disease: Secondary | ICD-10-CM | POA: Diagnosis not present

## 2022-02-07 DIAGNOSIS — N186 End stage renal disease: Secondary | ICD-10-CM | POA: Diagnosis not present

## 2022-02-07 DIAGNOSIS — D509 Iron deficiency anemia, unspecified: Secondary | ICD-10-CM | POA: Diagnosis not present

## 2022-02-09 DIAGNOSIS — D631 Anemia in chronic kidney disease: Secondary | ICD-10-CM | POA: Diagnosis not present

## 2022-02-09 DIAGNOSIS — Z992 Dependence on renal dialysis: Secondary | ICD-10-CM | POA: Diagnosis not present

## 2022-02-09 DIAGNOSIS — N186 End stage renal disease: Secondary | ICD-10-CM | POA: Diagnosis not present

## 2022-02-09 DIAGNOSIS — D509 Iron deficiency anemia, unspecified: Secondary | ICD-10-CM | POA: Diagnosis not present

## 2022-02-09 DIAGNOSIS — N2581 Secondary hyperparathyroidism of renal origin: Secondary | ICD-10-CM | POA: Diagnosis not present

## 2022-02-12 DIAGNOSIS — D631 Anemia in chronic kidney disease: Secondary | ICD-10-CM | POA: Diagnosis not present

## 2022-02-12 DIAGNOSIS — Z992 Dependence on renal dialysis: Secondary | ICD-10-CM | POA: Diagnosis not present

## 2022-02-12 DIAGNOSIS — N186 End stage renal disease: Secondary | ICD-10-CM | POA: Diagnosis not present

## 2022-02-12 DIAGNOSIS — D509 Iron deficiency anemia, unspecified: Secondary | ICD-10-CM | POA: Diagnosis not present

## 2022-02-12 DIAGNOSIS — N2581 Secondary hyperparathyroidism of renal origin: Secondary | ICD-10-CM | POA: Diagnosis not present

## 2022-02-13 DIAGNOSIS — T82858A Stenosis of vascular prosthetic devices, implants and grafts, initial encounter: Secondary | ICD-10-CM | POA: Diagnosis not present

## 2022-02-13 DIAGNOSIS — E1122 Type 2 diabetes mellitus with diabetic chronic kidney disease: Secondary | ICD-10-CM | POA: Diagnosis not present

## 2022-02-14 DIAGNOSIS — Z992 Dependence on renal dialysis: Secondary | ICD-10-CM | POA: Diagnosis not present

## 2022-02-14 DIAGNOSIS — D631 Anemia in chronic kidney disease: Secondary | ICD-10-CM | POA: Diagnosis not present

## 2022-02-14 DIAGNOSIS — N2581 Secondary hyperparathyroidism of renal origin: Secondary | ICD-10-CM | POA: Diagnosis not present

## 2022-02-14 DIAGNOSIS — D509 Iron deficiency anemia, unspecified: Secondary | ICD-10-CM | POA: Diagnosis not present

## 2022-02-14 DIAGNOSIS — N186 End stage renal disease: Secondary | ICD-10-CM | POA: Diagnosis not present

## 2022-02-16 DIAGNOSIS — Z992 Dependence on renal dialysis: Secondary | ICD-10-CM | POA: Diagnosis not present

## 2022-02-16 DIAGNOSIS — D509 Iron deficiency anemia, unspecified: Secondary | ICD-10-CM | POA: Diagnosis not present

## 2022-02-16 DIAGNOSIS — N186 End stage renal disease: Secondary | ICD-10-CM | POA: Diagnosis not present

## 2022-02-16 DIAGNOSIS — D631 Anemia in chronic kidney disease: Secondary | ICD-10-CM | POA: Diagnosis not present

## 2022-02-16 DIAGNOSIS — N2581 Secondary hyperparathyroidism of renal origin: Secondary | ICD-10-CM | POA: Diagnosis not present

## 2022-02-19 DIAGNOSIS — D631 Anemia in chronic kidney disease: Secondary | ICD-10-CM | POA: Diagnosis not present

## 2022-02-19 DIAGNOSIS — N2581 Secondary hyperparathyroidism of renal origin: Secondary | ICD-10-CM | POA: Diagnosis not present

## 2022-02-19 DIAGNOSIS — Z992 Dependence on renal dialysis: Secondary | ICD-10-CM | POA: Diagnosis not present

## 2022-02-19 DIAGNOSIS — D509 Iron deficiency anemia, unspecified: Secondary | ICD-10-CM | POA: Diagnosis not present

## 2022-02-19 DIAGNOSIS — N186 End stage renal disease: Secondary | ICD-10-CM | POA: Diagnosis not present

## 2022-02-21 DIAGNOSIS — N186 End stage renal disease: Secondary | ICD-10-CM | POA: Diagnosis not present

## 2022-02-21 DIAGNOSIS — D509 Iron deficiency anemia, unspecified: Secondary | ICD-10-CM | POA: Diagnosis not present

## 2022-02-21 DIAGNOSIS — N2581 Secondary hyperparathyroidism of renal origin: Secondary | ICD-10-CM | POA: Diagnosis not present

## 2022-02-21 DIAGNOSIS — D631 Anemia in chronic kidney disease: Secondary | ICD-10-CM | POA: Diagnosis not present

## 2022-02-21 DIAGNOSIS — Z992 Dependence on renal dialysis: Secondary | ICD-10-CM | POA: Diagnosis not present

## 2022-02-23 DIAGNOSIS — Z992 Dependence on renal dialysis: Secondary | ICD-10-CM | POA: Diagnosis not present

## 2022-02-23 DIAGNOSIS — N2581 Secondary hyperparathyroidism of renal origin: Secondary | ICD-10-CM | POA: Diagnosis not present

## 2022-02-23 DIAGNOSIS — N186 End stage renal disease: Secondary | ICD-10-CM | POA: Diagnosis not present

## 2022-02-23 DIAGNOSIS — D631 Anemia in chronic kidney disease: Secondary | ICD-10-CM | POA: Diagnosis not present

## 2022-02-23 DIAGNOSIS — D509 Iron deficiency anemia, unspecified: Secondary | ICD-10-CM | POA: Diagnosis not present

## 2022-02-26 DIAGNOSIS — N186 End stage renal disease: Secondary | ICD-10-CM | POA: Diagnosis not present

## 2022-02-26 DIAGNOSIS — D631 Anemia in chronic kidney disease: Secondary | ICD-10-CM | POA: Diagnosis not present

## 2022-02-26 DIAGNOSIS — Z992 Dependence on renal dialysis: Secondary | ICD-10-CM | POA: Diagnosis not present

## 2022-02-26 DIAGNOSIS — D509 Iron deficiency anemia, unspecified: Secondary | ICD-10-CM | POA: Diagnosis not present

## 2022-02-26 DIAGNOSIS — N2581 Secondary hyperparathyroidism of renal origin: Secondary | ICD-10-CM | POA: Diagnosis not present

## 2022-02-28 DIAGNOSIS — D631 Anemia in chronic kidney disease: Secondary | ICD-10-CM | POA: Diagnosis not present

## 2022-02-28 DIAGNOSIS — N2581 Secondary hyperparathyroidism of renal origin: Secondary | ICD-10-CM | POA: Diagnosis not present

## 2022-02-28 DIAGNOSIS — Z992 Dependence on renal dialysis: Secondary | ICD-10-CM | POA: Diagnosis not present

## 2022-02-28 DIAGNOSIS — D509 Iron deficiency anemia, unspecified: Secondary | ICD-10-CM | POA: Diagnosis not present

## 2022-02-28 DIAGNOSIS — N186 End stage renal disease: Secondary | ICD-10-CM | POA: Diagnosis not present

## 2022-03-01 DIAGNOSIS — N186 End stage renal disease: Secondary | ICD-10-CM | POA: Diagnosis not present

## 2022-03-01 DIAGNOSIS — Z992 Dependence on renal dialysis: Secondary | ICD-10-CM | POA: Diagnosis not present

## 2022-03-02 DIAGNOSIS — D509 Iron deficiency anemia, unspecified: Secondary | ICD-10-CM | POA: Diagnosis not present

## 2022-03-02 DIAGNOSIS — N2581 Secondary hyperparathyroidism of renal origin: Secondary | ICD-10-CM | POA: Diagnosis not present

## 2022-03-02 DIAGNOSIS — Z992 Dependence on renal dialysis: Secondary | ICD-10-CM | POA: Diagnosis not present

## 2022-03-02 DIAGNOSIS — N186 End stage renal disease: Secondary | ICD-10-CM | POA: Diagnosis not present

## 2022-03-02 DIAGNOSIS — D631 Anemia in chronic kidney disease: Secondary | ICD-10-CM | POA: Diagnosis not present

## 2022-03-05 DIAGNOSIS — Z992 Dependence on renal dialysis: Secondary | ICD-10-CM | POA: Diagnosis not present

## 2022-03-05 DIAGNOSIS — D631 Anemia in chronic kidney disease: Secondary | ICD-10-CM | POA: Diagnosis not present

## 2022-03-05 DIAGNOSIS — N186 End stage renal disease: Secondary | ICD-10-CM | POA: Diagnosis not present

## 2022-03-05 DIAGNOSIS — D509 Iron deficiency anemia, unspecified: Secondary | ICD-10-CM | POA: Diagnosis not present

## 2022-03-05 DIAGNOSIS — N2581 Secondary hyperparathyroidism of renal origin: Secondary | ICD-10-CM | POA: Diagnosis not present

## 2022-03-07 DIAGNOSIS — N2581 Secondary hyperparathyroidism of renal origin: Secondary | ICD-10-CM | POA: Diagnosis not present

## 2022-03-07 DIAGNOSIS — D509 Iron deficiency anemia, unspecified: Secondary | ICD-10-CM | POA: Diagnosis not present

## 2022-03-07 DIAGNOSIS — Z992 Dependence on renal dialysis: Secondary | ICD-10-CM | POA: Diagnosis not present

## 2022-03-07 DIAGNOSIS — N186 End stage renal disease: Secondary | ICD-10-CM | POA: Diagnosis not present

## 2022-03-07 DIAGNOSIS — D631 Anemia in chronic kidney disease: Secondary | ICD-10-CM | POA: Diagnosis not present

## 2022-03-09 DIAGNOSIS — D631 Anemia in chronic kidney disease: Secondary | ICD-10-CM | POA: Diagnosis not present

## 2022-03-09 DIAGNOSIS — N186 End stage renal disease: Secondary | ICD-10-CM | POA: Diagnosis not present

## 2022-03-09 DIAGNOSIS — Z992 Dependence on renal dialysis: Secondary | ICD-10-CM | POA: Diagnosis not present

## 2022-03-09 DIAGNOSIS — N2581 Secondary hyperparathyroidism of renal origin: Secondary | ICD-10-CM | POA: Diagnosis not present

## 2022-03-09 DIAGNOSIS — D509 Iron deficiency anemia, unspecified: Secondary | ICD-10-CM | POA: Diagnosis not present

## 2022-03-12 DIAGNOSIS — Z992 Dependence on renal dialysis: Secondary | ICD-10-CM | POA: Diagnosis not present

## 2022-03-12 DIAGNOSIS — N186 End stage renal disease: Secondary | ICD-10-CM | POA: Diagnosis not present

## 2022-03-12 DIAGNOSIS — D509 Iron deficiency anemia, unspecified: Secondary | ICD-10-CM | POA: Diagnosis not present

## 2022-03-12 DIAGNOSIS — D631 Anemia in chronic kidney disease: Secondary | ICD-10-CM | POA: Diagnosis not present

## 2022-03-12 DIAGNOSIS — N2581 Secondary hyperparathyroidism of renal origin: Secondary | ICD-10-CM | POA: Diagnosis not present

## 2022-03-14 DIAGNOSIS — N186 End stage renal disease: Secondary | ICD-10-CM | POA: Diagnosis not present

## 2022-03-14 DIAGNOSIS — D631 Anemia in chronic kidney disease: Secondary | ICD-10-CM | POA: Diagnosis not present

## 2022-03-14 DIAGNOSIS — N2581 Secondary hyperparathyroidism of renal origin: Secondary | ICD-10-CM | POA: Diagnosis not present

## 2022-03-14 DIAGNOSIS — D509 Iron deficiency anemia, unspecified: Secondary | ICD-10-CM | POA: Diagnosis not present

## 2022-03-14 DIAGNOSIS — Z992 Dependence on renal dialysis: Secondary | ICD-10-CM | POA: Diagnosis not present

## 2022-03-15 DIAGNOSIS — E1140 Type 2 diabetes mellitus with diabetic neuropathy, unspecified: Secondary | ICD-10-CM | POA: Diagnosis not present

## 2022-03-15 DIAGNOSIS — I739 Peripheral vascular disease, unspecified: Secondary | ICD-10-CM | POA: Diagnosis not present

## 2022-03-15 DIAGNOSIS — Z992 Dependence on renal dialysis: Secondary | ICD-10-CM | POA: Diagnosis not present

## 2022-03-15 DIAGNOSIS — M79671 Pain in right foot: Secondary | ICD-10-CM | POA: Diagnosis not present

## 2022-03-16 DIAGNOSIS — D509 Iron deficiency anemia, unspecified: Secondary | ICD-10-CM | POA: Diagnosis not present

## 2022-03-16 DIAGNOSIS — D631 Anemia in chronic kidney disease: Secondary | ICD-10-CM | POA: Diagnosis not present

## 2022-03-16 DIAGNOSIS — N186 End stage renal disease: Secondary | ICD-10-CM | POA: Diagnosis not present

## 2022-03-16 DIAGNOSIS — N2581 Secondary hyperparathyroidism of renal origin: Secondary | ICD-10-CM | POA: Diagnosis not present

## 2022-03-16 DIAGNOSIS — Z992 Dependence on renal dialysis: Secondary | ICD-10-CM | POA: Diagnosis not present

## 2022-03-19 DIAGNOSIS — D631 Anemia in chronic kidney disease: Secondary | ICD-10-CM | POA: Diagnosis not present

## 2022-03-19 DIAGNOSIS — D509 Iron deficiency anemia, unspecified: Secondary | ICD-10-CM | POA: Diagnosis not present

## 2022-03-19 DIAGNOSIS — N186 End stage renal disease: Secondary | ICD-10-CM | POA: Diagnosis not present

## 2022-03-19 DIAGNOSIS — N2581 Secondary hyperparathyroidism of renal origin: Secondary | ICD-10-CM | POA: Diagnosis not present

## 2022-03-19 DIAGNOSIS — Z992 Dependence on renal dialysis: Secondary | ICD-10-CM | POA: Diagnosis not present

## 2022-03-21 DIAGNOSIS — D631 Anemia in chronic kidney disease: Secondary | ICD-10-CM | POA: Diagnosis not present

## 2022-03-21 DIAGNOSIS — N2581 Secondary hyperparathyroidism of renal origin: Secondary | ICD-10-CM | POA: Diagnosis not present

## 2022-03-21 DIAGNOSIS — Z992 Dependence on renal dialysis: Secondary | ICD-10-CM | POA: Diagnosis not present

## 2022-03-21 DIAGNOSIS — N186 End stage renal disease: Secondary | ICD-10-CM | POA: Diagnosis not present

## 2022-03-21 DIAGNOSIS — D509 Iron deficiency anemia, unspecified: Secondary | ICD-10-CM | POA: Diagnosis not present

## 2022-03-23 DIAGNOSIS — Z992 Dependence on renal dialysis: Secondary | ICD-10-CM | POA: Diagnosis not present

## 2022-03-23 DIAGNOSIS — N2581 Secondary hyperparathyroidism of renal origin: Secondary | ICD-10-CM | POA: Diagnosis not present

## 2022-03-23 DIAGNOSIS — N186 End stage renal disease: Secondary | ICD-10-CM | POA: Diagnosis not present

## 2022-03-23 DIAGNOSIS — D509 Iron deficiency anemia, unspecified: Secondary | ICD-10-CM | POA: Diagnosis not present

## 2022-03-23 DIAGNOSIS — D631 Anemia in chronic kidney disease: Secondary | ICD-10-CM | POA: Diagnosis not present

## 2022-03-25 DIAGNOSIS — D509 Iron deficiency anemia, unspecified: Secondary | ICD-10-CM | POA: Diagnosis not present

## 2022-03-25 DIAGNOSIS — N2581 Secondary hyperparathyroidism of renal origin: Secondary | ICD-10-CM | POA: Diagnosis not present

## 2022-03-25 DIAGNOSIS — N186 End stage renal disease: Secondary | ICD-10-CM | POA: Diagnosis not present

## 2022-03-25 DIAGNOSIS — D631 Anemia in chronic kidney disease: Secondary | ICD-10-CM | POA: Diagnosis not present

## 2022-03-25 DIAGNOSIS — Z992 Dependence on renal dialysis: Secondary | ICD-10-CM | POA: Diagnosis not present

## 2022-03-28 DIAGNOSIS — Z992 Dependence on renal dialysis: Secondary | ICD-10-CM | POA: Diagnosis not present

## 2022-03-28 DIAGNOSIS — N186 End stage renal disease: Secondary | ICD-10-CM | POA: Diagnosis not present

## 2022-03-28 DIAGNOSIS — D509 Iron deficiency anemia, unspecified: Secondary | ICD-10-CM | POA: Diagnosis not present

## 2022-03-28 DIAGNOSIS — D631 Anemia in chronic kidney disease: Secondary | ICD-10-CM | POA: Diagnosis not present

## 2022-03-28 DIAGNOSIS — N2581 Secondary hyperparathyroidism of renal origin: Secondary | ICD-10-CM | POA: Diagnosis not present

## 2022-03-30 DIAGNOSIS — D631 Anemia in chronic kidney disease: Secondary | ICD-10-CM | POA: Diagnosis not present

## 2022-03-30 DIAGNOSIS — D509 Iron deficiency anemia, unspecified: Secondary | ICD-10-CM | POA: Diagnosis not present

## 2022-03-30 DIAGNOSIS — N2581 Secondary hyperparathyroidism of renal origin: Secondary | ICD-10-CM | POA: Diagnosis not present

## 2022-03-30 DIAGNOSIS — Z992 Dependence on renal dialysis: Secondary | ICD-10-CM | POA: Diagnosis not present

## 2022-03-30 DIAGNOSIS — N186 End stage renal disease: Secondary | ICD-10-CM | POA: Diagnosis not present

## 2022-04-01 DIAGNOSIS — N186 End stage renal disease: Secondary | ICD-10-CM | POA: Diagnosis not present

## 2022-04-01 DIAGNOSIS — D631 Anemia in chronic kidney disease: Secondary | ICD-10-CM | POA: Diagnosis not present

## 2022-04-01 DIAGNOSIS — N2581 Secondary hyperparathyroidism of renal origin: Secondary | ICD-10-CM | POA: Diagnosis not present

## 2022-04-01 DIAGNOSIS — D509 Iron deficiency anemia, unspecified: Secondary | ICD-10-CM | POA: Diagnosis not present

## 2022-04-01 DIAGNOSIS — Z992 Dependence on renal dialysis: Secondary | ICD-10-CM | POA: Diagnosis not present

## 2022-04-04 DIAGNOSIS — N186 End stage renal disease: Secondary | ICD-10-CM | POA: Diagnosis not present

## 2022-04-04 DIAGNOSIS — D509 Iron deficiency anemia, unspecified: Secondary | ICD-10-CM | POA: Diagnosis not present

## 2022-04-04 DIAGNOSIS — D631 Anemia in chronic kidney disease: Secondary | ICD-10-CM | POA: Diagnosis not present

## 2022-04-04 DIAGNOSIS — N2581 Secondary hyperparathyroidism of renal origin: Secondary | ICD-10-CM | POA: Diagnosis not present

## 2022-04-04 DIAGNOSIS — Z992 Dependence on renal dialysis: Secondary | ICD-10-CM | POA: Diagnosis not present

## 2022-04-06 DIAGNOSIS — N186 End stage renal disease: Secondary | ICD-10-CM | POA: Diagnosis not present

## 2022-04-06 DIAGNOSIS — D509 Iron deficiency anemia, unspecified: Secondary | ICD-10-CM | POA: Diagnosis not present

## 2022-04-06 DIAGNOSIS — D631 Anemia in chronic kidney disease: Secondary | ICD-10-CM | POA: Diagnosis not present

## 2022-04-06 DIAGNOSIS — N2581 Secondary hyperparathyroidism of renal origin: Secondary | ICD-10-CM | POA: Diagnosis not present

## 2022-04-06 DIAGNOSIS — Z992 Dependence on renal dialysis: Secondary | ICD-10-CM | POA: Diagnosis not present

## 2022-04-09 DIAGNOSIS — D509 Iron deficiency anemia, unspecified: Secondary | ICD-10-CM | POA: Diagnosis not present

## 2022-04-09 DIAGNOSIS — D631 Anemia in chronic kidney disease: Secondary | ICD-10-CM | POA: Diagnosis not present

## 2022-04-09 DIAGNOSIS — E1129 Type 2 diabetes mellitus with other diabetic kidney complication: Secondary | ICD-10-CM | POA: Diagnosis not present

## 2022-04-09 DIAGNOSIS — Z992 Dependence on renal dialysis: Secondary | ICD-10-CM | POA: Diagnosis not present

## 2022-04-09 DIAGNOSIS — N2581 Secondary hyperparathyroidism of renal origin: Secondary | ICD-10-CM | POA: Diagnosis not present

## 2022-04-09 DIAGNOSIS — N186 End stage renal disease: Secondary | ICD-10-CM | POA: Diagnosis not present

## 2022-04-11 DIAGNOSIS — D631 Anemia in chronic kidney disease: Secondary | ICD-10-CM | POA: Diagnosis not present

## 2022-04-11 DIAGNOSIS — N186 End stage renal disease: Secondary | ICD-10-CM | POA: Diagnosis not present

## 2022-04-11 DIAGNOSIS — Z992 Dependence on renal dialysis: Secondary | ICD-10-CM | POA: Diagnosis not present

## 2022-04-11 DIAGNOSIS — D509 Iron deficiency anemia, unspecified: Secondary | ICD-10-CM | POA: Diagnosis not present

## 2022-04-11 DIAGNOSIS — N2581 Secondary hyperparathyroidism of renal origin: Secondary | ICD-10-CM | POA: Diagnosis not present

## 2022-04-12 DIAGNOSIS — M79671 Pain in right foot: Secondary | ICD-10-CM | POA: Diagnosis not present

## 2022-04-12 DIAGNOSIS — Z89421 Acquired absence of other right toe(s): Secondary | ICD-10-CM | POA: Diagnosis not present

## 2022-04-12 DIAGNOSIS — E1140 Type 2 diabetes mellitus with diabetic neuropathy, unspecified: Secondary | ICD-10-CM | POA: Diagnosis not present

## 2022-04-12 DIAGNOSIS — Z992 Dependence on renal dialysis: Secondary | ICD-10-CM | POA: Diagnosis not present

## 2022-04-13 DIAGNOSIS — N186 End stage renal disease: Secondary | ICD-10-CM | POA: Diagnosis not present

## 2022-04-13 DIAGNOSIS — Z992 Dependence on renal dialysis: Secondary | ICD-10-CM | POA: Diagnosis not present

## 2022-04-13 DIAGNOSIS — N2581 Secondary hyperparathyroidism of renal origin: Secondary | ICD-10-CM | POA: Diagnosis not present

## 2022-04-13 DIAGNOSIS — D509 Iron deficiency anemia, unspecified: Secondary | ICD-10-CM | POA: Diagnosis not present

## 2022-04-13 DIAGNOSIS — D631 Anemia in chronic kidney disease: Secondary | ICD-10-CM | POA: Diagnosis not present

## 2022-04-16 DIAGNOSIS — D631 Anemia in chronic kidney disease: Secondary | ICD-10-CM | POA: Diagnosis not present

## 2022-04-16 DIAGNOSIS — N2581 Secondary hyperparathyroidism of renal origin: Secondary | ICD-10-CM | POA: Diagnosis not present

## 2022-04-16 DIAGNOSIS — D509 Iron deficiency anemia, unspecified: Secondary | ICD-10-CM | POA: Diagnosis not present

## 2022-04-16 DIAGNOSIS — N186 End stage renal disease: Secondary | ICD-10-CM | POA: Diagnosis not present

## 2022-04-16 DIAGNOSIS — Z992 Dependence on renal dialysis: Secondary | ICD-10-CM | POA: Diagnosis not present

## 2022-04-18 DIAGNOSIS — Z992 Dependence on renal dialysis: Secondary | ICD-10-CM | POA: Diagnosis not present

## 2022-04-18 DIAGNOSIS — N2581 Secondary hyperparathyroidism of renal origin: Secondary | ICD-10-CM | POA: Diagnosis not present

## 2022-04-18 DIAGNOSIS — D631 Anemia in chronic kidney disease: Secondary | ICD-10-CM | POA: Diagnosis not present

## 2022-04-18 DIAGNOSIS — N186 End stage renal disease: Secondary | ICD-10-CM | POA: Diagnosis not present

## 2022-04-18 DIAGNOSIS — D509 Iron deficiency anemia, unspecified: Secondary | ICD-10-CM | POA: Diagnosis not present

## 2022-04-20 DIAGNOSIS — Z992 Dependence on renal dialysis: Secondary | ICD-10-CM | POA: Diagnosis not present

## 2022-04-20 DIAGNOSIS — N186 End stage renal disease: Secondary | ICD-10-CM | POA: Diagnosis not present

## 2022-04-20 DIAGNOSIS — D631 Anemia in chronic kidney disease: Secondary | ICD-10-CM | POA: Diagnosis not present

## 2022-04-20 DIAGNOSIS — D509 Iron deficiency anemia, unspecified: Secondary | ICD-10-CM | POA: Diagnosis not present

## 2022-04-20 DIAGNOSIS — N2581 Secondary hyperparathyroidism of renal origin: Secondary | ICD-10-CM | POA: Diagnosis not present

## 2022-04-23 DIAGNOSIS — Z992 Dependence on renal dialysis: Secondary | ICD-10-CM | POA: Diagnosis not present

## 2022-04-23 DIAGNOSIS — D509 Iron deficiency anemia, unspecified: Secondary | ICD-10-CM | POA: Diagnosis not present

## 2022-04-23 DIAGNOSIS — D631 Anemia in chronic kidney disease: Secondary | ICD-10-CM | POA: Diagnosis not present

## 2022-04-23 DIAGNOSIS — N2581 Secondary hyperparathyroidism of renal origin: Secondary | ICD-10-CM | POA: Diagnosis not present

## 2022-04-23 DIAGNOSIS — N186 End stage renal disease: Secondary | ICD-10-CM | POA: Diagnosis not present

## 2022-04-24 DIAGNOSIS — N186 End stage renal disease: Secondary | ICD-10-CM | POA: Diagnosis not present

## 2022-04-24 DIAGNOSIS — I10 Essential (primary) hypertension: Secondary | ICD-10-CM | POA: Diagnosis not present

## 2022-04-24 DIAGNOSIS — E1122 Type 2 diabetes mellitus with diabetic chronic kidney disease: Secondary | ICD-10-CM | POA: Diagnosis not present

## 2022-04-24 DIAGNOSIS — Z299 Encounter for prophylactic measures, unspecified: Secondary | ICD-10-CM | POA: Diagnosis not present

## 2022-04-24 DIAGNOSIS — E1165 Type 2 diabetes mellitus with hyperglycemia: Secondary | ICD-10-CM | POA: Diagnosis not present

## 2022-04-25 DIAGNOSIS — D509 Iron deficiency anemia, unspecified: Secondary | ICD-10-CM | POA: Diagnosis not present

## 2022-04-25 DIAGNOSIS — D631 Anemia in chronic kidney disease: Secondary | ICD-10-CM | POA: Diagnosis not present

## 2022-04-25 DIAGNOSIS — Z992 Dependence on renal dialysis: Secondary | ICD-10-CM | POA: Diagnosis not present

## 2022-04-25 DIAGNOSIS — N186 End stage renal disease: Secondary | ICD-10-CM | POA: Diagnosis not present

## 2022-04-25 DIAGNOSIS — N2581 Secondary hyperparathyroidism of renal origin: Secondary | ICD-10-CM | POA: Diagnosis not present

## 2022-04-27 DIAGNOSIS — N186 End stage renal disease: Secondary | ICD-10-CM | POA: Diagnosis not present

## 2022-04-27 DIAGNOSIS — N2581 Secondary hyperparathyroidism of renal origin: Secondary | ICD-10-CM | POA: Diagnosis not present

## 2022-04-27 DIAGNOSIS — Z992 Dependence on renal dialysis: Secondary | ICD-10-CM | POA: Diagnosis not present

## 2022-04-27 DIAGNOSIS — D509 Iron deficiency anemia, unspecified: Secondary | ICD-10-CM | POA: Diagnosis not present

## 2022-04-27 DIAGNOSIS — D631 Anemia in chronic kidney disease: Secondary | ICD-10-CM | POA: Diagnosis not present

## 2022-04-30 DIAGNOSIS — D509 Iron deficiency anemia, unspecified: Secondary | ICD-10-CM | POA: Diagnosis not present

## 2022-04-30 DIAGNOSIS — N2581 Secondary hyperparathyroidism of renal origin: Secondary | ICD-10-CM | POA: Diagnosis not present

## 2022-04-30 DIAGNOSIS — Z992 Dependence on renal dialysis: Secondary | ICD-10-CM | POA: Diagnosis not present

## 2022-04-30 DIAGNOSIS — D631 Anemia in chronic kidney disease: Secondary | ICD-10-CM | POA: Diagnosis not present

## 2022-04-30 DIAGNOSIS — N186 End stage renal disease: Secondary | ICD-10-CM | POA: Diagnosis not present

## 2022-05-02 DIAGNOSIS — D509 Iron deficiency anemia, unspecified: Secondary | ICD-10-CM | POA: Diagnosis not present

## 2022-05-02 DIAGNOSIS — N2581 Secondary hyperparathyroidism of renal origin: Secondary | ICD-10-CM | POA: Diagnosis not present

## 2022-05-02 DIAGNOSIS — N186 End stage renal disease: Secondary | ICD-10-CM | POA: Diagnosis not present

## 2022-05-02 DIAGNOSIS — D631 Anemia in chronic kidney disease: Secondary | ICD-10-CM | POA: Diagnosis not present

## 2022-05-02 DIAGNOSIS — Z992 Dependence on renal dialysis: Secondary | ICD-10-CM | POA: Diagnosis not present

## 2022-05-04 DIAGNOSIS — D631 Anemia in chronic kidney disease: Secondary | ICD-10-CM | POA: Diagnosis not present

## 2022-05-04 DIAGNOSIS — Z992 Dependence on renal dialysis: Secondary | ICD-10-CM | POA: Diagnosis not present

## 2022-05-04 DIAGNOSIS — D509 Iron deficiency anemia, unspecified: Secondary | ICD-10-CM | POA: Diagnosis not present

## 2022-05-04 DIAGNOSIS — N2581 Secondary hyperparathyroidism of renal origin: Secondary | ICD-10-CM | POA: Diagnosis not present

## 2022-05-04 DIAGNOSIS — N186 End stage renal disease: Secondary | ICD-10-CM | POA: Diagnosis not present

## 2022-05-07 DIAGNOSIS — N2581 Secondary hyperparathyroidism of renal origin: Secondary | ICD-10-CM | POA: Diagnosis not present

## 2022-05-07 DIAGNOSIS — D509 Iron deficiency anemia, unspecified: Secondary | ICD-10-CM | POA: Diagnosis not present

## 2022-05-07 DIAGNOSIS — Z992 Dependence on renal dialysis: Secondary | ICD-10-CM | POA: Diagnosis not present

## 2022-05-07 DIAGNOSIS — N186 End stage renal disease: Secondary | ICD-10-CM | POA: Diagnosis not present

## 2022-05-07 DIAGNOSIS — D631 Anemia in chronic kidney disease: Secondary | ICD-10-CM | POA: Diagnosis not present

## 2022-05-09 DIAGNOSIS — N2581 Secondary hyperparathyroidism of renal origin: Secondary | ICD-10-CM | POA: Diagnosis not present

## 2022-05-09 DIAGNOSIS — N186 End stage renal disease: Secondary | ICD-10-CM | POA: Diagnosis not present

## 2022-05-09 DIAGNOSIS — Z992 Dependence on renal dialysis: Secondary | ICD-10-CM | POA: Diagnosis not present

## 2022-05-09 DIAGNOSIS — D631 Anemia in chronic kidney disease: Secondary | ICD-10-CM | POA: Diagnosis not present

## 2022-05-09 DIAGNOSIS — D509 Iron deficiency anemia, unspecified: Secondary | ICD-10-CM | POA: Diagnosis not present

## 2022-05-11 DIAGNOSIS — Z992 Dependence on renal dialysis: Secondary | ICD-10-CM | POA: Diagnosis not present

## 2022-05-11 DIAGNOSIS — D509 Iron deficiency anemia, unspecified: Secondary | ICD-10-CM | POA: Diagnosis not present

## 2022-05-11 DIAGNOSIS — N186 End stage renal disease: Secondary | ICD-10-CM | POA: Diagnosis not present

## 2022-05-11 DIAGNOSIS — D631 Anemia in chronic kidney disease: Secondary | ICD-10-CM | POA: Diagnosis not present

## 2022-05-11 DIAGNOSIS — N2581 Secondary hyperparathyroidism of renal origin: Secondary | ICD-10-CM | POA: Diagnosis not present

## 2022-05-14 DIAGNOSIS — D631 Anemia in chronic kidney disease: Secondary | ICD-10-CM | POA: Diagnosis not present

## 2022-05-14 DIAGNOSIS — Z992 Dependence on renal dialysis: Secondary | ICD-10-CM | POA: Diagnosis not present

## 2022-05-14 DIAGNOSIS — D509 Iron deficiency anemia, unspecified: Secondary | ICD-10-CM | POA: Diagnosis not present

## 2022-05-14 DIAGNOSIS — N186 End stage renal disease: Secondary | ICD-10-CM | POA: Diagnosis not present

## 2022-05-14 DIAGNOSIS — N2581 Secondary hyperparathyroidism of renal origin: Secondary | ICD-10-CM | POA: Diagnosis not present

## 2022-05-16 DIAGNOSIS — N2581 Secondary hyperparathyroidism of renal origin: Secondary | ICD-10-CM | POA: Diagnosis not present

## 2022-05-16 DIAGNOSIS — N186 End stage renal disease: Secondary | ICD-10-CM | POA: Diagnosis not present

## 2022-05-16 DIAGNOSIS — Z992 Dependence on renal dialysis: Secondary | ICD-10-CM | POA: Diagnosis not present

## 2022-05-16 DIAGNOSIS — D631 Anemia in chronic kidney disease: Secondary | ICD-10-CM | POA: Diagnosis not present

## 2022-05-16 DIAGNOSIS — D509 Iron deficiency anemia, unspecified: Secondary | ICD-10-CM | POA: Diagnosis not present

## 2022-05-17 DIAGNOSIS — E1140 Type 2 diabetes mellitus with diabetic neuropathy, unspecified: Secondary | ICD-10-CM | POA: Diagnosis not present

## 2022-05-17 DIAGNOSIS — M2041 Other hammer toe(s) (acquired), right foot: Secondary | ICD-10-CM | POA: Diagnosis not present

## 2022-05-17 DIAGNOSIS — Z89421 Acquired absence of other right toe(s): Secondary | ICD-10-CM | POA: Diagnosis not present

## 2022-05-17 DIAGNOSIS — L600 Ingrowing nail: Secondary | ICD-10-CM | POA: Diagnosis not present

## 2022-05-18 DIAGNOSIS — N186 End stage renal disease: Secondary | ICD-10-CM | POA: Diagnosis not present

## 2022-05-18 DIAGNOSIS — D631 Anemia in chronic kidney disease: Secondary | ICD-10-CM | POA: Diagnosis not present

## 2022-05-18 DIAGNOSIS — D509 Iron deficiency anemia, unspecified: Secondary | ICD-10-CM | POA: Diagnosis not present

## 2022-05-18 DIAGNOSIS — N2581 Secondary hyperparathyroidism of renal origin: Secondary | ICD-10-CM | POA: Diagnosis not present

## 2022-05-18 DIAGNOSIS — Z992 Dependence on renal dialysis: Secondary | ICD-10-CM | POA: Diagnosis not present

## 2022-05-21 DIAGNOSIS — N186 End stage renal disease: Secondary | ICD-10-CM | POA: Diagnosis not present

## 2022-05-21 DIAGNOSIS — D631 Anemia in chronic kidney disease: Secondary | ICD-10-CM | POA: Diagnosis not present

## 2022-05-21 DIAGNOSIS — N2581 Secondary hyperparathyroidism of renal origin: Secondary | ICD-10-CM | POA: Diagnosis not present

## 2022-05-21 DIAGNOSIS — D509 Iron deficiency anemia, unspecified: Secondary | ICD-10-CM | POA: Diagnosis not present

## 2022-05-21 DIAGNOSIS — Z992 Dependence on renal dialysis: Secondary | ICD-10-CM | POA: Diagnosis not present

## 2022-05-23 DIAGNOSIS — N2581 Secondary hyperparathyroidism of renal origin: Secondary | ICD-10-CM | POA: Diagnosis not present

## 2022-05-23 DIAGNOSIS — Z992 Dependence on renal dialysis: Secondary | ICD-10-CM | POA: Diagnosis not present

## 2022-05-23 DIAGNOSIS — N186 End stage renal disease: Secondary | ICD-10-CM | POA: Diagnosis not present

## 2022-05-23 DIAGNOSIS — D509 Iron deficiency anemia, unspecified: Secondary | ICD-10-CM | POA: Diagnosis not present

## 2022-05-23 DIAGNOSIS — D631 Anemia in chronic kidney disease: Secondary | ICD-10-CM | POA: Diagnosis not present

## 2022-05-25 DIAGNOSIS — N186 End stage renal disease: Secondary | ICD-10-CM | POA: Diagnosis not present

## 2022-05-25 DIAGNOSIS — D631 Anemia in chronic kidney disease: Secondary | ICD-10-CM | POA: Diagnosis not present

## 2022-05-25 DIAGNOSIS — Z992 Dependence on renal dialysis: Secondary | ICD-10-CM | POA: Diagnosis not present

## 2022-05-25 DIAGNOSIS — D509 Iron deficiency anemia, unspecified: Secondary | ICD-10-CM | POA: Diagnosis not present

## 2022-05-25 DIAGNOSIS — N2581 Secondary hyperparathyroidism of renal origin: Secondary | ICD-10-CM | POA: Diagnosis not present

## 2022-05-28 DIAGNOSIS — N2581 Secondary hyperparathyroidism of renal origin: Secondary | ICD-10-CM | POA: Diagnosis not present

## 2022-05-28 DIAGNOSIS — D631 Anemia in chronic kidney disease: Secondary | ICD-10-CM | POA: Diagnosis not present

## 2022-05-28 DIAGNOSIS — N186 End stage renal disease: Secondary | ICD-10-CM | POA: Diagnosis not present

## 2022-05-28 DIAGNOSIS — D509 Iron deficiency anemia, unspecified: Secondary | ICD-10-CM | POA: Diagnosis not present

## 2022-05-28 DIAGNOSIS — Z992 Dependence on renal dialysis: Secondary | ICD-10-CM | POA: Diagnosis not present

## 2022-05-30 DIAGNOSIS — N2581 Secondary hyperparathyroidism of renal origin: Secondary | ICD-10-CM | POA: Diagnosis not present

## 2022-05-30 DIAGNOSIS — N186 End stage renal disease: Secondary | ICD-10-CM | POA: Diagnosis not present

## 2022-05-30 DIAGNOSIS — D509 Iron deficiency anemia, unspecified: Secondary | ICD-10-CM | POA: Diagnosis not present

## 2022-05-30 DIAGNOSIS — Z992 Dependence on renal dialysis: Secondary | ICD-10-CM | POA: Diagnosis not present

## 2022-05-30 DIAGNOSIS — D631 Anemia in chronic kidney disease: Secondary | ICD-10-CM | POA: Diagnosis not present

## 2022-05-31 DIAGNOSIS — N186 End stage renal disease: Secondary | ICD-10-CM | POA: Diagnosis not present

## 2022-05-31 DIAGNOSIS — Z992 Dependence on renal dialysis: Secondary | ICD-10-CM | POA: Diagnosis not present

## 2022-06-01 DIAGNOSIS — N2581 Secondary hyperparathyroidism of renal origin: Secondary | ICD-10-CM | POA: Diagnosis not present

## 2022-06-01 DIAGNOSIS — Z992 Dependence on renal dialysis: Secondary | ICD-10-CM | POA: Diagnosis not present

## 2022-06-01 DIAGNOSIS — N186 End stage renal disease: Secondary | ICD-10-CM | POA: Diagnosis not present

## 2022-06-01 DIAGNOSIS — D631 Anemia in chronic kidney disease: Secondary | ICD-10-CM | POA: Diagnosis not present

## 2022-06-01 DIAGNOSIS — D509 Iron deficiency anemia, unspecified: Secondary | ICD-10-CM | POA: Diagnosis not present

## 2022-06-04 DIAGNOSIS — N2581 Secondary hyperparathyroidism of renal origin: Secondary | ICD-10-CM | POA: Diagnosis not present

## 2022-06-04 DIAGNOSIS — Z992 Dependence on renal dialysis: Secondary | ICD-10-CM | POA: Diagnosis not present

## 2022-06-04 DIAGNOSIS — N186 End stage renal disease: Secondary | ICD-10-CM | POA: Diagnosis not present

## 2022-06-04 DIAGNOSIS — D509 Iron deficiency anemia, unspecified: Secondary | ICD-10-CM | POA: Diagnosis not present

## 2022-06-04 DIAGNOSIS — D631 Anemia in chronic kidney disease: Secondary | ICD-10-CM | POA: Diagnosis not present

## 2022-06-06 DIAGNOSIS — Z992 Dependence on renal dialysis: Secondary | ICD-10-CM | POA: Diagnosis not present

## 2022-06-06 DIAGNOSIS — N186 End stage renal disease: Secondary | ICD-10-CM | POA: Diagnosis not present

## 2022-06-06 DIAGNOSIS — D509 Iron deficiency anemia, unspecified: Secondary | ICD-10-CM | POA: Diagnosis not present

## 2022-06-06 DIAGNOSIS — N2581 Secondary hyperparathyroidism of renal origin: Secondary | ICD-10-CM | POA: Diagnosis not present

## 2022-06-06 DIAGNOSIS — D631 Anemia in chronic kidney disease: Secondary | ICD-10-CM | POA: Diagnosis not present

## 2022-06-08 DIAGNOSIS — D631 Anemia in chronic kidney disease: Secondary | ICD-10-CM | POA: Diagnosis not present

## 2022-06-08 DIAGNOSIS — D509 Iron deficiency anemia, unspecified: Secondary | ICD-10-CM | POA: Diagnosis not present

## 2022-06-08 DIAGNOSIS — N186 End stage renal disease: Secondary | ICD-10-CM | POA: Diagnosis not present

## 2022-06-08 DIAGNOSIS — Z992 Dependence on renal dialysis: Secondary | ICD-10-CM | POA: Diagnosis not present

## 2022-06-08 DIAGNOSIS — N2581 Secondary hyperparathyroidism of renal origin: Secondary | ICD-10-CM | POA: Diagnosis not present

## 2022-06-11 DIAGNOSIS — D631 Anemia in chronic kidney disease: Secondary | ICD-10-CM | POA: Diagnosis not present

## 2022-06-11 DIAGNOSIS — N2581 Secondary hyperparathyroidism of renal origin: Secondary | ICD-10-CM | POA: Diagnosis not present

## 2022-06-11 DIAGNOSIS — D509 Iron deficiency anemia, unspecified: Secondary | ICD-10-CM | POA: Diagnosis not present

## 2022-06-11 DIAGNOSIS — Z992 Dependence on renal dialysis: Secondary | ICD-10-CM | POA: Diagnosis not present

## 2022-06-11 DIAGNOSIS — N186 End stage renal disease: Secondary | ICD-10-CM | POA: Diagnosis not present

## 2022-06-13 DIAGNOSIS — N2581 Secondary hyperparathyroidism of renal origin: Secondary | ICD-10-CM | POA: Diagnosis not present

## 2022-06-13 DIAGNOSIS — D509 Iron deficiency anemia, unspecified: Secondary | ICD-10-CM | POA: Diagnosis not present

## 2022-06-13 DIAGNOSIS — N186 End stage renal disease: Secondary | ICD-10-CM | POA: Diagnosis not present

## 2022-06-13 DIAGNOSIS — D631 Anemia in chronic kidney disease: Secondary | ICD-10-CM | POA: Diagnosis not present

## 2022-06-13 DIAGNOSIS — Z992 Dependence on renal dialysis: Secondary | ICD-10-CM | POA: Diagnosis not present

## 2022-06-15 DIAGNOSIS — D631 Anemia in chronic kidney disease: Secondary | ICD-10-CM | POA: Diagnosis not present

## 2022-06-15 DIAGNOSIS — N2581 Secondary hyperparathyroidism of renal origin: Secondary | ICD-10-CM | POA: Diagnosis not present

## 2022-06-15 DIAGNOSIS — Z992 Dependence on renal dialysis: Secondary | ICD-10-CM | POA: Diagnosis not present

## 2022-06-15 DIAGNOSIS — N186 End stage renal disease: Secondary | ICD-10-CM | POA: Diagnosis not present

## 2022-06-15 DIAGNOSIS — D509 Iron deficiency anemia, unspecified: Secondary | ICD-10-CM | POA: Diagnosis not present

## 2022-06-18 DIAGNOSIS — Z992 Dependence on renal dialysis: Secondary | ICD-10-CM | POA: Diagnosis not present

## 2022-06-18 DIAGNOSIS — N186 End stage renal disease: Secondary | ICD-10-CM | POA: Diagnosis not present

## 2022-06-18 DIAGNOSIS — N2581 Secondary hyperparathyroidism of renal origin: Secondary | ICD-10-CM | POA: Diagnosis not present

## 2022-06-18 DIAGNOSIS — D631 Anemia in chronic kidney disease: Secondary | ICD-10-CM | POA: Diagnosis not present

## 2022-06-18 DIAGNOSIS — D509 Iron deficiency anemia, unspecified: Secondary | ICD-10-CM | POA: Diagnosis not present

## 2022-06-20 DIAGNOSIS — N186 End stage renal disease: Secondary | ICD-10-CM | POA: Diagnosis not present

## 2022-06-20 DIAGNOSIS — Z992 Dependence on renal dialysis: Secondary | ICD-10-CM | POA: Diagnosis not present

## 2022-06-20 DIAGNOSIS — N2581 Secondary hyperparathyroidism of renal origin: Secondary | ICD-10-CM | POA: Diagnosis not present

## 2022-06-20 DIAGNOSIS — D631 Anemia in chronic kidney disease: Secondary | ICD-10-CM | POA: Diagnosis not present

## 2022-06-20 DIAGNOSIS — D509 Iron deficiency anemia, unspecified: Secondary | ICD-10-CM | POA: Diagnosis not present

## 2022-06-22 DIAGNOSIS — D509 Iron deficiency anemia, unspecified: Secondary | ICD-10-CM | POA: Diagnosis not present

## 2022-06-22 DIAGNOSIS — N186 End stage renal disease: Secondary | ICD-10-CM | POA: Diagnosis not present

## 2022-06-22 DIAGNOSIS — N2581 Secondary hyperparathyroidism of renal origin: Secondary | ICD-10-CM | POA: Diagnosis not present

## 2022-06-22 DIAGNOSIS — Z992 Dependence on renal dialysis: Secondary | ICD-10-CM | POA: Diagnosis not present

## 2022-06-22 DIAGNOSIS — D631 Anemia in chronic kidney disease: Secondary | ICD-10-CM | POA: Diagnosis not present

## 2022-06-25 DIAGNOSIS — N2581 Secondary hyperparathyroidism of renal origin: Secondary | ICD-10-CM | POA: Diagnosis not present

## 2022-06-25 DIAGNOSIS — N186 End stage renal disease: Secondary | ICD-10-CM | POA: Diagnosis not present

## 2022-06-25 DIAGNOSIS — D631 Anemia in chronic kidney disease: Secondary | ICD-10-CM | POA: Diagnosis not present

## 2022-06-25 DIAGNOSIS — D509 Iron deficiency anemia, unspecified: Secondary | ICD-10-CM | POA: Diagnosis not present

## 2022-06-25 DIAGNOSIS — Z992 Dependence on renal dialysis: Secondary | ICD-10-CM | POA: Diagnosis not present

## 2022-06-27 DIAGNOSIS — N2581 Secondary hyperparathyroidism of renal origin: Secondary | ICD-10-CM | POA: Diagnosis not present

## 2022-06-27 DIAGNOSIS — N186 End stage renal disease: Secondary | ICD-10-CM | POA: Diagnosis not present

## 2022-06-27 DIAGNOSIS — D631 Anemia in chronic kidney disease: Secondary | ICD-10-CM | POA: Diagnosis not present

## 2022-06-27 DIAGNOSIS — Z992 Dependence on renal dialysis: Secondary | ICD-10-CM | POA: Diagnosis not present

## 2022-06-27 DIAGNOSIS — D509 Iron deficiency anemia, unspecified: Secondary | ICD-10-CM | POA: Diagnosis not present

## 2022-06-29 DIAGNOSIS — D509 Iron deficiency anemia, unspecified: Secondary | ICD-10-CM | POA: Diagnosis not present

## 2022-06-29 DIAGNOSIS — N186 End stage renal disease: Secondary | ICD-10-CM | POA: Diagnosis not present

## 2022-06-29 DIAGNOSIS — Z992 Dependence on renal dialysis: Secondary | ICD-10-CM | POA: Diagnosis not present

## 2022-06-29 DIAGNOSIS — D631 Anemia in chronic kidney disease: Secondary | ICD-10-CM | POA: Diagnosis not present

## 2022-06-29 DIAGNOSIS — N2581 Secondary hyperparathyroidism of renal origin: Secondary | ICD-10-CM | POA: Diagnosis not present

## 2022-07-01 DIAGNOSIS — N186 End stage renal disease: Secondary | ICD-10-CM | POA: Diagnosis not present

## 2022-07-01 DIAGNOSIS — Z992 Dependence on renal dialysis: Secondary | ICD-10-CM | POA: Diagnosis not present

## 2022-07-02 DIAGNOSIS — N186 End stage renal disease: Secondary | ICD-10-CM | POA: Diagnosis not present

## 2022-07-02 DIAGNOSIS — E1129 Type 2 diabetes mellitus with other diabetic kidney complication: Secondary | ICD-10-CM | POA: Diagnosis not present

## 2022-07-02 DIAGNOSIS — N2581 Secondary hyperparathyroidism of renal origin: Secondary | ICD-10-CM | POA: Diagnosis not present

## 2022-07-02 DIAGNOSIS — D509 Iron deficiency anemia, unspecified: Secondary | ICD-10-CM | POA: Diagnosis not present

## 2022-07-02 DIAGNOSIS — D631 Anemia in chronic kidney disease: Secondary | ICD-10-CM | POA: Diagnosis not present

## 2022-07-02 DIAGNOSIS — Z992 Dependence on renal dialysis: Secondary | ICD-10-CM | POA: Diagnosis not present

## 2022-07-04 DIAGNOSIS — Z992 Dependence on renal dialysis: Secondary | ICD-10-CM | POA: Diagnosis not present

## 2022-07-04 DIAGNOSIS — D631 Anemia in chronic kidney disease: Secondary | ICD-10-CM | POA: Diagnosis not present

## 2022-07-04 DIAGNOSIS — N186 End stage renal disease: Secondary | ICD-10-CM | POA: Diagnosis not present

## 2022-07-04 DIAGNOSIS — N2581 Secondary hyperparathyroidism of renal origin: Secondary | ICD-10-CM | POA: Diagnosis not present

## 2022-07-04 DIAGNOSIS — D509 Iron deficiency anemia, unspecified: Secondary | ICD-10-CM | POA: Diagnosis not present

## 2022-07-06 DIAGNOSIS — D631 Anemia in chronic kidney disease: Secondary | ICD-10-CM | POA: Diagnosis not present

## 2022-07-06 DIAGNOSIS — N2581 Secondary hyperparathyroidism of renal origin: Secondary | ICD-10-CM | POA: Diagnosis not present

## 2022-07-06 DIAGNOSIS — N186 End stage renal disease: Secondary | ICD-10-CM | POA: Diagnosis not present

## 2022-07-06 DIAGNOSIS — Z992 Dependence on renal dialysis: Secondary | ICD-10-CM | POA: Diagnosis not present

## 2022-07-06 DIAGNOSIS — D509 Iron deficiency anemia, unspecified: Secondary | ICD-10-CM | POA: Diagnosis not present

## 2022-07-09 DIAGNOSIS — D509 Iron deficiency anemia, unspecified: Secondary | ICD-10-CM | POA: Diagnosis not present

## 2022-07-09 DIAGNOSIS — N186 End stage renal disease: Secondary | ICD-10-CM | POA: Diagnosis not present

## 2022-07-09 DIAGNOSIS — N2581 Secondary hyperparathyroidism of renal origin: Secondary | ICD-10-CM | POA: Diagnosis not present

## 2022-07-09 DIAGNOSIS — D631 Anemia in chronic kidney disease: Secondary | ICD-10-CM | POA: Diagnosis not present

## 2022-07-09 DIAGNOSIS — Z992 Dependence on renal dialysis: Secondary | ICD-10-CM | POA: Diagnosis not present

## 2022-07-11 DIAGNOSIS — N186 End stage renal disease: Secondary | ICD-10-CM | POA: Diagnosis not present

## 2022-07-11 DIAGNOSIS — D509 Iron deficiency anemia, unspecified: Secondary | ICD-10-CM | POA: Diagnosis not present

## 2022-07-11 DIAGNOSIS — N2581 Secondary hyperparathyroidism of renal origin: Secondary | ICD-10-CM | POA: Diagnosis not present

## 2022-07-11 DIAGNOSIS — D631 Anemia in chronic kidney disease: Secondary | ICD-10-CM | POA: Diagnosis not present

## 2022-07-11 DIAGNOSIS — Z992 Dependence on renal dialysis: Secondary | ICD-10-CM | POA: Diagnosis not present

## 2022-07-13 DIAGNOSIS — N186 End stage renal disease: Secondary | ICD-10-CM | POA: Diagnosis not present

## 2022-07-13 DIAGNOSIS — N2581 Secondary hyperparathyroidism of renal origin: Secondary | ICD-10-CM | POA: Diagnosis not present

## 2022-07-13 DIAGNOSIS — D509 Iron deficiency anemia, unspecified: Secondary | ICD-10-CM | POA: Diagnosis not present

## 2022-07-13 DIAGNOSIS — Z992 Dependence on renal dialysis: Secondary | ICD-10-CM | POA: Diagnosis not present

## 2022-07-13 DIAGNOSIS — D631 Anemia in chronic kidney disease: Secondary | ICD-10-CM | POA: Diagnosis not present

## 2022-07-16 DIAGNOSIS — Z992 Dependence on renal dialysis: Secondary | ICD-10-CM | POA: Diagnosis not present

## 2022-07-16 DIAGNOSIS — D631 Anemia in chronic kidney disease: Secondary | ICD-10-CM | POA: Diagnosis not present

## 2022-07-16 DIAGNOSIS — D509 Iron deficiency anemia, unspecified: Secondary | ICD-10-CM | POA: Diagnosis not present

## 2022-07-16 DIAGNOSIS — N186 End stage renal disease: Secondary | ICD-10-CM | POA: Diagnosis not present

## 2022-07-16 DIAGNOSIS — N2581 Secondary hyperparathyroidism of renal origin: Secondary | ICD-10-CM | POA: Diagnosis not present

## 2022-07-18 DIAGNOSIS — Z992 Dependence on renal dialysis: Secondary | ICD-10-CM | POA: Diagnosis not present

## 2022-07-18 DIAGNOSIS — N186 End stage renal disease: Secondary | ICD-10-CM | POA: Diagnosis not present

## 2022-07-18 DIAGNOSIS — D509 Iron deficiency anemia, unspecified: Secondary | ICD-10-CM | POA: Diagnosis not present

## 2022-07-18 DIAGNOSIS — N2581 Secondary hyperparathyroidism of renal origin: Secondary | ICD-10-CM | POA: Diagnosis not present

## 2022-07-18 DIAGNOSIS — D631 Anemia in chronic kidney disease: Secondary | ICD-10-CM | POA: Diagnosis not present

## 2022-07-20 DIAGNOSIS — N186 End stage renal disease: Secondary | ICD-10-CM | POA: Diagnosis not present

## 2022-07-20 DIAGNOSIS — D509 Iron deficiency anemia, unspecified: Secondary | ICD-10-CM | POA: Diagnosis not present

## 2022-07-20 DIAGNOSIS — Z992 Dependence on renal dialysis: Secondary | ICD-10-CM | POA: Diagnosis not present

## 2022-07-20 DIAGNOSIS — N2581 Secondary hyperparathyroidism of renal origin: Secondary | ICD-10-CM | POA: Diagnosis not present

## 2022-07-20 DIAGNOSIS — D631 Anemia in chronic kidney disease: Secondary | ICD-10-CM | POA: Diagnosis not present

## 2022-07-23 DIAGNOSIS — N186 End stage renal disease: Secondary | ICD-10-CM | POA: Diagnosis not present

## 2022-07-23 DIAGNOSIS — D631 Anemia in chronic kidney disease: Secondary | ICD-10-CM | POA: Diagnosis not present

## 2022-07-23 DIAGNOSIS — D509 Iron deficiency anemia, unspecified: Secondary | ICD-10-CM | POA: Diagnosis not present

## 2022-07-23 DIAGNOSIS — N2581 Secondary hyperparathyroidism of renal origin: Secondary | ICD-10-CM | POA: Diagnosis not present

## 2022-07-23 DIAGNOSIS — Z992 Dependence on renal dialysis: Secondary | ICD-10-CM | POA: Diagnosis not present

## 2022-07-25 DIAGNOSIS — D631 Anemia in chronic kidney disease: Secondary | ICD-10-CM | POA: Diagnosis not present

## 2022-07-25 DIAGNOSIS — D509 Iron deficiency anemia, unspecified: Secondary | ICD-10-CM | POA: Diagnosis not present

## 2022-07-25 DIAGNOSIS — N186 End stage renal disease: Secondary | ICD-10-CM | POA: Diagnosis not present

## 2022-07-25 DIAGNOSIS — Z992 Dependence on renal dialysis: Secondary | ICD-10-CM | POA: Diagnosis not present

## 2022-07-25 DIAGNOSIS — N2581 Secondary hyperparathyroidism of renal origin: Secondary | ICD-10-CM | POA: Diagnosis not present

## 2022-07-27 DIAGNOSIS — Z992 Dependence on renal dialysis: Secondary | ICD-10-CM | POA: Diagnosis not present

## 2022-07-27 DIAGNOSIS — N2581 Secondary hyperparathyroidism of renal origin: Secondary | ICD-10-CM | POA: Diagnosis not present

## 2022-07-27 DIAGNOSIS — D509 Iron deficiency anemia, unspecified: Secondary | ICD-10-CM | POA: Diagnosis not present

## 2022-07-27 DIAGNOSIS — N186 End stage renal disease: Secondary | ICD-10-CM | POA: Diagnosis not present

## 2022-07-27 DIAGNOSIS — D631 Anemia in chronic kidney disease: Secondary | ICD-10-CM | POA: Diagnosis not present

## 2022-07-30 DIAGNOSIS — D509 Iron deficiency anemia, unspecified: Secondary | ICD-10-CM | POA: Diagnosis not present

## 2022-07-30 DIAGNOSIS — D631 Anemia in chronic kidney disease: Secondary | ICD-10-CM | POA: Diagnosis not present

## 2022-07-30 DIAGNOSIS — Z992 Dependence on renal dialysis: Secondary | ICD-10-CM | POA: Diagnosis not present

## 2022-07-30 DIAGNOSIS — N186 End stage renal disease: Secondary | ICD-10-CM | POA: Diagnosis not present

## 2022-07-30 DIAGNOSIS — N2581 Secondary hyperparathyroidism of renal origin: Secondary | ICD-10-CM | POA: Diagnosis not present

## 2022-07-31 DIAGNOSIS — Z992 Dependence on renal dialysis: Secondary | ICD-10-CM | POA: Diagnosis not present

## 2022-07-31 DIAGNOSIS — N186 End stage renal disease: Secondary | ICD-10-CM | POA: Diagnosis not present

## 2022-08-01 DIAGNOSIS — N2581 Secondary hyperparathyroidism of renal origin: Secondary | ICD-10-CM | POA: Diagnosis not present

## 2022-08-01 DIAGNOSIS — N186 End stage renal disease: Secondary | ICD-10-CM | POA: Diagnosis not present

## 2022-08-01 DIAGNOSIS — D509 Iron deficiency anemia, unspecified: Secondary | ICD-10-CM | POA: Diagnosis not present

## 2022-08-01 DIAGNOSIS — Z992 Dependence on renal dialysis: Secondary | ICD-10-CM | POA: Diagnosis not present

## 2022-08-01 DIAGNOSIS — D631 Anemia in chronic kidney disease: Secondary | ICD-10-CM | POA: Diagnosis not present

## 2022-08-03 DIAGNOSIS — N2581 Secondary hyperparathyroidism of renal origin: Secondary | ICD-10-CM | POA: Diagnosis not present

## 2022-08-03 DIAGNOSIS — D509 Iron deficiency anemia, unspecified: Secondary | ICD-10-CM | POA: Diagnosis not present

## 2022-08-03 DIAGNOSIS — D631 Anemia in chronic kidney disease: Secondary | ICD-10-CM | POA: Diagnosis not present

## 2022-08-03 DIAGNOSIS — Z992 Dependence on renal dialysis: Secondary | ICD-10-CM | POA: Diagnosis not present

## 2022-08-03 DIAGNOSIS — N186 End stage renal disease: Secondary | ICD-10-CM | POA: Diagnosis not present

## 2022-08-06 DIAGNOSIS — Z992 Dependence on renal dialysis: Secondary | ICD-10-CM | POA: Diagnosis not present

## 2022-08-06 DIAGNOSIS — N2581 Secondary hyperparathyroidism of renal origin: Secondary | ICD-10-CM | POA: Diagnosis not present

## 2022-08-06 DIAGNOSIS — D509 Iron deficiency anemia, unspecified: Secondary | ICD-10-CM | POA: Diagnosis not present

## 2022-08-06 DIAGNOSIS — D631 Anemia in chronic kidney disease: Secondary | ICD-10-CM | POA: Diagnosis not present

## 2022-08-06 DIAGNOSIS — N186 End stage renal disease: Secondary | ICD-10-CM | POA: Diagnosis not present

## 2022-08-08 DIAGNOSIS — N2581 Secondary hyperparathyroidism of renal origin: Secondary | ICD-10-CM | POA: Diagnosis not present

## 2022-08-08 DIAGNOSIS — Z992 Dependence on renal dialysis: Secondary | ICD-10-CM | POA: Diagnosis not present

## 2022-08-08 DIAGNOSIS — D631 Anemia in chronic kidney disease: Secondary | ICD-10-CM | POA: Diagnosis not present

## 2022-08-08 DIAGNOSIS — D509 Iron deficiency anemia, unspecified: Secondary | ICD-10-CM | POA: Diagnosis not present

## 2022-08-08 DIAGNOSIS — N186 End stage renal disease: Secondary | ICD-10-CM | POA: Diagnosis not present

## 2022-08-09 DIAGNOSIS — L0231 Cutaneous abscess of buttock: Secondary | ICD-10-CM | POA: Diagnosis not present

## 2022-08-09 DIAGNOSIS — N186 End stage renal disease: Secondary | ICD-10-CM | POA: Diagnosis not present

## 2022-08-09 DIAGNOSIS — I10 Essential (primary) hypertension: Secondary | ICD-10-CM | POA: Diagnosis not present

## 2022-08-09 DIAGNOSIS — E1165 Type 2 diabetes mellitus with hyperglycemia: Secondary | ICD-10-CM | POA: Diagnosis not present

## 2022-08-09 DIAGNOSIS — E1122 Type 2 diabetes mellitus with diabetic chronic kidney disease: Secondary | ICD-10-CM | POA: Diagnosis not present

## 2022-08-09 DIAGNOSIS — Z299 Encounter for prophylactic measures, unspecified: Secondary | ICD-10-CM | POA: Diagnosis not present

## 2022-08-10 DIAGNOSIS — Z992 Dependence on renal dialysis: Secondary | ICD-10-CM | POA: Diagnosis not present

## 2022-08-10 DIAGNOSIS — D631 Anemia in chronic kidney disease: Secondary | ICD-10-CM | POA: Diagnosis not present

## 2022-08-10 DIAGNOSIS — D509 Iron deficiency anemia, unspecified: Secondary | ICD-10-CM | POA: Diagnosis not present

## 2022-08-10 DIAGNOSIS — N2581 Secondary hyperparathyroidism of renal origin: Secondary | ICD-10-CM | POA: Diagnosis not present

## 2022-08-10 DIAGNOSIS — N186 End stage renal disease: Secondary | ICD-10-CM | POA: Diagnosis not present

## 2022-08-13 DIAGNOSIS — D631 Anemia in chronic kidney disease: Secondary | ICD-10-CM | POA: Diagnosis not present

## 2022-08-13 DIAGNOSIS — N186 End stage renal disease: Secondary | ICD-10-CM | POA: Diagnosis not present

## 2022-08-13 DIAGNOSIS — D509 Iron deficiency anemia, unspecified: Secondary | ICD-10-CM | POA: Diagnosis not present

## 2022-08-13 DIAGNOSIS — Z992 Dependence on renal dialysis: Secondary | ICD-10-CM | POA: Diagnosis not present

## 2022-08-13 DIAGNOSIS — N2581 Secondary hyperparathyroidism of renal origin: Secondary | ICD-10-CM | POA: Diagnosis not present

## 2022-08-15 DIAGNOSIS — Z992 Dependence on renal dialysis: Secondary | ICD-10-CM | POA: Diagnosis not present

## 2022-08-15 DIAGNOSIS — D631 Anemia in chronic kidney disease: Secondary | ICD-10-CM | POA: Diagnosis not present

## 2022-08-15 DIAGNOSIS — N186 End stage renal disease: Secondary | ICD-10-CM | POA: Diagnosis not present

## 2022-08-15 DIAGNOSIS — N2581 Secondary hyperparathyroidism of renal origin: Secondary | ICD-10-CM | POA: Diagnosis not present

## 2022-08-15 DIAGNOSIS — D509 Iron deficiency anemia, unspecified: Secondary | ICD-10-CM | POA: Diagnosis not present

## 2022-08-16 DIAGNOSIS — Z89421 Acquired absence of other right toe(s): Secondary | ICD-10-CM | POA: Diagnosis not present

## 2022-08-16 DIAGNOSIS — E1140 Type 2 diabetes mellitus with diabetic neuropathy, unspecified: Secondary | ICD-10-CM | POA: Diagnosis not present

## 2022-08-16 DIAGNOSIS — M79671 Pain in right foot: Secondary | ICD-10-CM | POA: Diagnosis not present

## 2022-08-16 DIAGNOSIS — L600 Ingrowing nail: Secondary | ICD-10-CM | POA: Diagnosis not present

## 2022-08-16 DIAGNOSIS — Z992 Dependence on renal dialysis: Secondary | ICD-10-CM | POA: Diagnosis not present

## 2022-08-17 DIAGNOSIS — N2581 Secondary hyperparathyroidism of renal origin: Secondary | ICD-10-CM | POA: Diagnosis not present

## 2022-08-17 DIAGNOSIS — D631 Anemia in chronic kidney disease: Secondary | ICD-10-CM | POA: Diagnosis not present

## 2022-08-17 DIAGNOSIS — D509 Iron deficiency anemia, unspecified: Secondary | ICD-10-CM | POA: Diagnosis not present

## 2022-08-17 DIAGNOSIS — N186 End stage renal disease: Secondary | ICD-10-CM | POA: Diagnosis not present

## 2022-08-17 DIAGNOSIS — Z992 Dependence on renal dialysis: Secondary | ICD-10-CM | POA: Diagnosis not present

## 2022-08-20 DIAGNOSIS — N186 End stage renal disease: Secondary | ICD-10-CM | POA: Diagnosis not present

## 2022-08-20 DIAGNOSIS — D631 Anemia in chronic kidney disease: Secondary | ICD-10-CM | POA: Diagnosis not present

## 2022-08-20 DIAGNOSIS — N2581 Secondary hyperparathyroidism of renal origin: Secondary | ICD-10-CM | POA: Diagnosis not present

## 2022-08-20 DIAGNOSIS — Z992 Dependence on renal dialysis: Secondary | ICD-10-CM | POA: Diagnosis not present

## 2022-08-20 DIAGNOSIS — D509 Iron deficiency anemia, unspecified: Secondary | ICD-10-CM | POA: Diagnosis not present

## 2022-08-22 DIAGNOSIS — D509 Iron deficiency anemia, unspecified: Secondary | ICD-10-CM | POA: Diagnosis not present

## 2022-08-22 DIAGNOSIS — Z992 Dependence on renal dialysis: Secondary | ICD-10-CM | POA: Diagnosis not present

## 2022-08-22 DIAGNOSIS — D631 Anemia in chronic kidney disease: Secondary | ICD-10-CM | POA: Diagnosis not present

## 2022-08-22 DIAGNOSIS — N186 End stage renal disease: Secondary | ICD-10-CM | POA: Diagnosis not present

## 2022-08-22 DIAGNOSIS — N2581 Secondary hyperparathyroidism of renal origin: Secondary | ICD-10-CM | POA: Diagnosis not present

## 2022-08-24 DIAGNOSIS — N2581 Secondary hyperparathyroidism of renal origin: Secondary | ICD-10-CM | POA: Diagnosis not present

## 2022-08-24 DIAGNOSIS — Z992 Dependence on renal dialysis: Secondary | ICD-10-CM | POA: Diagnosis not present

## 2022-08-24 DIAGNOSIS — D631 Anemia in chronic kidney disease: Secondary | ICD-10-CM | POA: Diagnosis not present

## 2022-08-24 DIAGNOSIS — D509 Iron deficiency anemia, unspecified: Secondary | ICD-10-CM | POA: Diagnosis not present

## 2022-08-24 DIAGNOSIS — N186 End stage renal disease: Secondary | ICD-10-CM | POA: Diagnosis not present

## 2022-08-27 DIAGNOSIS — Z992 Dependence on renal dialysis: Secondary | ICD-10-CM | POA: Diagnosis not present

## 2022-08-27 DIAGNOSIS — N186 End stage renal disease: Secondary | ICD-10-CM | POA: Diagnosis not present

## 2022-08-27 DIAGNOSIS — D631 Anemia in chronic kidney disease: Secondary | ICD-10-CM | POA: Diagnosis not present

## 2022-08-27 DIAGNOSIS — D509 Iron deficiency anemia, unspecified: Secondary | ICD-10-CM | POA: Diagnosis not present

## 2022-08-27 DIAGNOSIS — N2581 Secondary hyperparathyroidism of renal origin: Secondary | ICD-10-CM | POA: Diagnosis not present

## 2022-08-29 DIAGNOSIS — N2581 Secondary hyperparathyroidism of renal origin: Secondary | ICD-10-CM | POA: Diagnosis not present

## 2022-08-29 DIAGNOSIS — N186 End stage renal disease: Secondary | ICD-10-CM | POA: Diagnosis not present

## 2022-08-29 DIAGNOSIS — D509 Iron deficiency anemia, unspecified: Secondary | ICD-10-CM | POA: Diagnosis not present

## 2022-08-29 DIAGNOSIS — Z992 Dependence on renal dialysis: Secondary | ICD-10-CM | POA: Diagnosis not present

## 2022-08-29 DIAGNOSIS — D631 Anemia in chronic kidney disease: Secondary | ICD-10-CM | POA: Diagnosis not present

## 2022-08-30 DIAGNOSIS — E1122 Type 2 diabetes mellitus with diabetic chronic kidney disease: Secondary | ICD-10-CM | POA: Diagnosis not present

## 2022-08-30 DIAGNOSIS — T82858A Stenosis of vascular prosthetic devices, implants and grafts, initial encounter: Secondary | ICD-10-CM | POA: Diagnosis not present

## 2022-08-31 DIAGNOSIS — D631 Anemia in chronic kidney disease: Secondary | ICD-10-CM | POA: Diagnosis not present

## 2022-08-31 DIAGNOSIS — N186 End stage renal disease: Secondary | ICD-10-CM | POA: Diagnosis not present

## 2022-08-31 DIAGNOSIS — Z992 Dependence on renal dialysis: Secondary | ICD-10-CM | POA: Diagnosis not present

## 2022-08-31 DIAGNOSIS — N2581 Secondary hyperparathyroidism of renal origin: Secondary | ICD-10-CM | POA: Diagnosis not present

## 2022-08-31 DIAGNOSIS — D509 Iron deficiency anemia, unspecified: Secondary | ICD-10-CM | POA: Diagnosis not present

## 2022-09-03 DIAGNOSIS — Z992 Dependence on renal dialysis: Secondary | ICD-10-CM | POA: Diagnosis not present

## 2022-09-03 DIAGNOSIS — D509 Iron deficiency anemia, unspecified: Secondary | ICD-10-CM | POA: Diagnosis not present

## 2022-09-03 DIAGNOSIS — N186 End stage renal disease: Secondary | ICD-10-CM | POA: Diagnosis not present

## 2022-09-03 DIAGNOSIS — D631 Anemia in chronic kidney disease: Secondary | ICD-10-CM | POA: Diagnosis not present

## 2022-09-03 DIAGNOSIS — N2581 Secondary hyperparathyroidism of renal origin: Secondary | ICD-10-CM | POA: Diagnosis not present

## 2022-09-05 DIAGNOSIS — N2581 Secondary hyperparathyroidism of renal origin: Secondary | ICD-10-CM | POA: Diagnosis not present

## 2022-09-05 DIAGNOSIS — N186 End stage renal disease: Secondary | ICD-10-CM | POA: Diagnosis not present

## 2022-09-05 DIAGNOSIS — Z992 Dependence on renal dialysis: Secondary | ICD-10-CM | POA: Diagnosis not present

## 2022-09-05 DIAGNOSIS — D509 Iron deficiency anemia, unspecified: Secondary | ICD-10-CM | POA: Diagnosis not present

## 2022-09-05 DIAGNOSIS — D631 Anemia in chronic kidney disease: Secondary | ICD-10-CM | POA: Diagnosis not present

## 2022-09-07 DIAGNOSIS — Z992 Dependence on renal dialysis: Secondary | ICD-10-CM | POA: Diagnosis not present

## 2022-09-07 DIAGNOSIS — D631 Anemia in chronic kidney disease: Secondary | ICD-10-CM | POA: Diagnosis not present

## 2022-09-07 DIAGNOSIS — N186 End stage renal disease: Secondary | ICD-10-CM | POA: Diagnosis not present

## 2022-09-07 DIAGNOSIS — N2581 Secondary hyperparathyroidism of renal origin: Secondary | ICD-10-CM | POA: Diagnosis not present

## 2022-09-07 DIAGNOSIS — D509 Iron deficiency anemia, unspecified: Secondary | ICD-10-CM | POA: Diagnosis not present

## 2022-09-10 DIAGNOSIS — Z992 Dependence on renal dialysis: Secondary | ICD-10-CM | POA: Diagnosis not present

## 2022-09-10 DIAGNOSIS — N186 End stage renal disease: Secondary | ICD-10-CM | POA: Diagnosis not present

## 2022-09-10 DIAGNOSIS — N2581 Secondary hyperparathyroidism of renal origin: Secondary | ICD-10-CM | POA: Diagnosis not present

## 2022-09-10 DIAGNOSIS — D631 Anemia in chronic kidney disease: Secondary | ICD-10-CM | POA: Diagnosis not present

## 2022-09-10 DIAGNOSIS — D509 Iron deficiency anemia, unspecified: Secondary | ICD-10-CM | POA: Diagnosis not present

## 2022-09-12 DIAGNOSIS — Z992 Dependence on renal dialysis: Secondary | ICD-10-CM | POA: Diagnosis not present

## 2022-09-12 DIAGNOSIS — D631 Anemia in chronic kidney disease: Secondary | ICD-10-CM | POA: Diagnosis not present

## 2022-09-12 DIAGNOSIS — N186 End stage renal disease: Secondary | ICD-10-CM | POA: Diagnosis not present

## 2022-09-12 DIAGNOSIS — D509 Iron deficiency anemia, unspecified: Secondary | ICD-10-CM | POA: Diagnosis not present

## 2022-09-12 DIAGNOSIS — N2581 Secondary hyperparathyroidism of renal origin: Secondary | ICD-10-CM | POA: Diagnosis not present

## 2022-09-13 DIAGNOSIS — E1122 Type 2 diabetes mellitus with diabetic chronic kidney disease: Secondary | ICD-10-CM | POA: Diagnosis not present

## 2022-09-13 DIAGNOSIS — I10 Essential (primary) hypertension: Secondary | ICD-10-CM | POA: Diagnosis not present

## 2022-09-13 DIAGNOSIS — G8194 Hemiplegia, unspecified affecting left nondominant side: Secondary | ICD-10-CM | POA: Diagnosis not present

## 2022-09-13 DIAGNOSIS — Z299 Encounter for prophylactic measures, unspecified: Secondary | ICD-10-CM | POA: Diagnosis not present

## 2022-09-14 DIAGNOSIS — Z992 Dependence on renal dialysis: Secondary | ICD-10-CM | POA: Diagnosis not present

## 2022-09-14 DIAGNOSIS — N186 End stage renal disease: Secondary | ICD-10-CM | POA: Diagnosis not present

## 2022-09-14 DIAGNOSIS — D631 Anemia in chronic kidney disease: Secondary | ICD-10-CM | POA: Diagnosis not present

## 2022-09-14 DIAGNOSIS — D509 Iron deficiency anemia, unspecified: Secondary | ICD-10-CM | POA: Diagnosis not present

## 2022-09-14 DIAGNOSIS — N2581 Secondary hyperparathyroidism of renal origin: Secondary | ICD-10-CM | POA: Diagnosis not present

## 2022-09-17 DIAGNOSIS — Z992 Dependence on renal dialysis: Secondary | ICD-10-CM | POA: Diagnosis not present

## 2022-09-17 DIAGNOSIS — N186 End stage renal disease: Secondary | ICD-10-CM | POA: Diagnosis not present

## 2022-09-17 DIAGNOSIS — D631 Anemia in chronic kidney disease: Secondary | ICD-10-CM | POA: Diagnosis not present

## 2022-09-17 DIAGNOSIS — N2581 Secondary hyperparathyroidism of renal origin: Secondary | ICD-10-CM | POA: Diagnosis not present

## 2022-09-17 DIAGNOSIS — D509 Iron deficiency anemia, unspecified: Secondary | ICD-10-CM | POA: Diagnosis not present

## 2022-09-19 DIAGNOSIS — D631 Anemia in chronic kidney disease: Secondary | ICD-10-CM | POA: Diagnosis not present

## 2022-09-19 DIAGNOSIS — D509 Iron deficiency anemia, unspecified: Secondary | ICD-10-CM | POA: Diagnosis not present

## 2022-09-19 DIAGNOSIS — Z992 Dependence on renal dialysis: Secondary | ICD-10-CM | POA: Diagnosis not present

## 2022-09-19 DIAGNOSIS — N2581 Secondary hyperparathyroidism of renal origin: Secondary | ICD-10-CM | POA: Diagnosis not present

## 2022-09-19 DIAGNOSIS — N186 End stage renal disease: Secondary | ICD-10-CM | POA: Diagnosis not present

## 2022-09-21 DIAGNOSIS — N2581 Secondary hyperparathyroidism of renal origin: Secondary | ICD-10-CM | POA: Diagnosis not present

## 2022-09-21 DIAGNOSIS — D631 Anemia in chronic kidney disease: Secondary | ICD-10-CM | POA: Diagnosis not present

## 2022-09-21 DIAGNOSIS — D509 Iron deficiency anemia, unspecified: Secondary | ICD-10-CM | POA: Diagnosis not present

## 2022-09-21 DIAGNOSIS — Z992 Dependence on renal dialysis: Secondary | ICD-10-CM | POA: Diagnosis not present

## 2022-09-21 DIAGNOSIS — N186 End stage renal disease: Secondary | ICD-10-CM | POA: Diagnosis not present

## 2022-09-24 DIAGNOSIS — N2581 Secondary hyperparathyroidism of renal origin: Secondary | ICD-10-CM | POA: Diagnosis not present

## 2022-09-24 DIAGNOSIS — D631 Anemia in chronic kidney disease: Secondary | ICD-10-CM | POA: Diagnosis not present

## 2022-09-24 DIAGNOSIS — Z992 Dependence on renal dialysis: Secondary | ICD-10-CM | POA: Diagnosis not present

## 2022-09-24 DIAGNOSIS — D509 Iron deficiency anemia, unspecified: Secondary | ICD-10-CM | POA: Diagnosis not present

## 2022-09-24 DIAGNOSIS — N186 End stage renal disease: Secondary | ICD-10-CM | POA: Diagnosis not present

## 2022-09-26 DIAGNOSIS — D631 Anemia in chronic kidney disease: Secondary | ICD-10-CM | POA: Diagnosis not present

## 2022-09-26 DIAGNOSIS — D509 Iron deficiency anemia, unspecified: Secondary | ICD-10-CM | POA: Diagnosis not present

## 2022-09-26 DIAGNOSIS — N2581 Secondary hyperparathyroidism of renal origin: Secondary | ICD-10-CM | POA: Diagnosis not present

## 2022-09-26 DIAGNOSIS — Z992 Dependence on renal dialysis: Secondary | ICD-10-CM | POA: Diagnosis not present

## 2022-09-26 DIAGNOSIS — N186 End stage renal disease: Secondary | ICD-10-CM | POA: Diagnosis not present

## 2022-09-28 DIAGNOSIS — N2581 Secondary hyperparathyroidism of renal origin: Secondary | ICD-10-CM | POA: Diagnosis not present

## 2022-09-28 DIAGNOSIS — D509 Iron deficiency anemia, unspecified: Secondary | ICD-10-CM | POA: Diagnosis not present

## 2022-09-28 DIAGNOSIS — Z992 Dependence on renal dialysis: Secondary | ICD-10-CM | POA: Diagnosis not present

## 2022-09-28 DIAGNOSIS — N186 End stage renal disease: Secondary | ICD-10-CM | POA: Diagnosis not present

## 2022-09-28 DIAGNOSIS — D631 Anemia in chronic kidney disease: Secondary | ICD-10-CM | POA: Diagnosis not present

## 2022-09-30 DIAGNOSIS — Z992 Dependence on renal dialysis: Secondary | ICD-10-CM | POA: Diagnosis not present

## 2022-09-30 DIAGNOSIS — N186 End stage renal disease: Secondary | ICD-10-CM | POA: Diagnosis not present

## 2022-10-01 DIAGNOSIS — N2581 Secondary hyperparathyroidism of renal origin: Secondary | ICD-10-CM | POA: Diagnosis not present

## 2022-10-01 DIAGNOSIS — N186 End stage renal disease: Secondary | ICD-10-CM | POA: Diagnosis not present

## 2022-10-01 DIAGNOSIS — D631 Anemia in chronic kidney disease: Secondary | ICD-10-CM | POA: Diagnosis not present

## 2022-10-01 DIAGNOSIS — D509 Iron deficiency anemia, unspecified: Secondary | ICD-10-CM | POA: Diagnosis not present

## 2022-10-01 DIAGNOSIS — Z992 Dependence on renal dialysis: Secondary | ICD-10-CM | POA: Diagnosis not present

## 2022-10-03 DIAGNOSIS — Z992 Dependence on renal dialysis: Secondary | ICD-10-CM | POA: Diagnosis not present

## 2022-10-03 DIAGNOSIS — N2581 Secondary hyperparathyroidism of renal origin: Secondary | ICD-10-CM | POA: Diagnosis not present

## 2022-10-03 DIAGNOSIS — N186 End stage renal disease: Secondary | ICD-10-CM | POA: Diagnosis not present

## 2022-10-03 DIAGNOSIS — D631 Anemia in chronic kidney disease: Secondary | ICD-10-CM | POA: Diagnosis not present

## 2022-10-03 DIAGNOSIS — D509 Iron deficiency anemia, unspecified: Secondary | ICD-10-CM | POA: Diagnosis not present

## 2022-10-05 DIAGNOSIS — D631 Anemia in chronic kidney disease: Secondary | ICD-10-CM | POA: Diagnosis not present

## 2022-10-05 DIAGNOSIS — D509 Iron deficiency anemia, unspecified: Secondary | ICD-10-CM | POA: Diagnosis not present

## 2022-10-05 DIAGNOSIS — N186 End stage renal disease: Secondary | ICD-10-CM | POA: Diagnosis not present

## 2022-10-05 DIAGNOSIS — Z992 Dependence on renal dialysis: Secondary | ICD-10-CM | POA: Diagnosis not present

## 2022-10-05 DIAGNOSIS — N2581 Secondary hyperparathyroidism of renal origin: Secondary | ICD-10-CM | POA: Diagnosis not present

## 2022-10-08 DIAGNOSIS — Z992 Dependence on renal dialysis: Secondary | ICD-10-CM | POA: Diagnosis not present

## 2022-10-08 DIAGNOSIS — D509 Iron deficiency anemia, unspecified: Secondary | ICD-10-CM | POA: Diagnosis not present

## 2022-10-08 DIAGNOSIS — N186 End stage renal disease: Secondary | ICD-10-CM | POA: Diagnosis not present

## 2022-10-08 DIAGNOSIS — D631 Anemia in chronic kidney disease: Secondary | ICD-10-CM | POA: Diagnosis not present

## 2022-10-08 DIAGNOSIS — N2581 Secondary hyperparathyroidism of renal origin: Secondary | ICD-10-CM | POA: Diagnosis not present

## 2022-10-10 DIAGNOSIS — N2581 Secondary hyperparathyroidism of renal origin: Secondary | ICD-10-CM | POA: Diagnosis not present

## 2022-10-10 DIAGNOSIS — N186 End stage renal disease: Secondary | ICD-10-CM | POA: Diagnosis not present

## 2022-10-10 DIAGNOSIS — Z992 Dependence on renal dialysis: Secondary | ICD-10-CM | POA: Diagnosis not present

## 2022-10-10 DIAGNOSIS — D631 Anemia in chronic kidney disease: Secondary | ICD-10-CM | POA: Diagnosis not present

## 2022-10-10 DIAGNOSIS — D509 Iron deficiency anemia, unspecified: Secondary | ICD-10-CM | POA: Diagnosis not present

## 2022-10-12 DIAGNOSIS — Z992 Dependence on renal dialysis: Secondary | ICD-10-CM | POA: Diagnosis not present

## 2022-10-12 DIAGNOSIS — D509 Iron deficiency anemia, unspecified: Secondary | ICD-10-CM | POA: Diagnosis not present

## 2022-10-12 DIAGNOSIS — N2581 Secondary hyperparathyroidism of renal origin: Secondary | ICD-10-CM | POA: Diagnosis not present

## 2022-10-12 DIAGNOSIS — D631 Anemia in chronic kidney disease: Secondary | ICD-10-CM | POA: Diagnosis not present

## 2022-10-12 DIAGNOSIS — N186 End stage renal disease: Secondary | ICD-10-CM | POA: Diagnosis not present

## 2022-10-15 DIAGNOSIS — N2581 Secondary hyperparathyroidism of renal origin: Secondary | ICD-10-CM | POA: Diagnosis not present

## 2022-10-15 DIAGNOSIS — N186 End stage renal disease: Secondary | ICD-10-CM | POA: Diagnosis not present

## 2022-10-15 DIAGNOSIS — Z992 Dependence on renal dialysis: Secondary | ICD-10-CM | POA: Diagnosis not present

## 2022-10-15 DIAGNOSIS — D509 Iron deficiency anemia, unspecified: Secondary | ICD-10-CM | POA: Diagnosis not present

## 2022-10-15 DIAGNOSIS — D631 Anemia in chronic kidney disease: Secondary | ICD-10-CM | POA: Diagnosis not present

## 2022-10-17 DIAGNOSIS — Z992 Dependence on renal dialysis: Secondary | ICD-10-CM | POA: Diagnosis not present

## 2022-10-17 DIAGNOSIS — D509 Iron deficiency anemia, unspecified: Secondary | ICD-10-CM | POA: Diagnosis not present

## 2022-10-17 DIAGNOSIS — N186 End stage renal disease: Secondary | ICD-10-CM | POA: Diagnosis not present

## 2022-10-17 DIAGNOSIS — N2581 Secondary hyperparathyroidism of renal origin: Secondary | ICD-10-CM | POA: Diagnosis not present

## 2022-10-17 DIAGNOSIS — D631 Anemia in chronic kidney disease: Secondary | ICD-10-CM | POA: Diagnosis not present

## 2022-10-18 DIAGNOSIS — N186 End stage renal disease: Secondary | ICD-10-CM | POA: Diagnosis not present

## 2022-10-19 DIAGNOSIS — N2581 Secondary hyperparathyroidism of renal origin: Secondary | ICD-10-CM | POA: Diagnosis not present

## 2022-10-19 DIAGNOSIS — Z992 Dependence on renal dialysis: Secondary | ICD-10-CM | POA: Diagnosis not present

## 2022-10-19 DIAGNOSIS — N186 End stage renal disease: Secondary | ICD-10-CM | POA: Diagnosis not present

## 2022-10-19 DIAGNOSIS — D509 Iron deficiency anemia, unspecified: Secondary | ICD-10-CM | POA: Diagnosis not present

## 2022-10-19 DIAGNOSIS — D631 Anemia in chronic kidney disease: Secondary | ICD-10-CM | POA: Diagnosis not present

## 2022-10-22 DIAGNOSIS — N2581 Secondary hyperparathyroidism of renal origin: Secondary | ICD-10-CM | POA: Diagnosis not present

## 2022-10-22 DIAGNOSIS — D631 Anemia in chronic kidney disease: Secondary | ICD-10-CM | POA: Diagnosis not present

## 2022-10-22 DIAGNOSIS — D509 Iron deficiency anemia, unspecified: Secondary | ICD-10-CM | POA: Diagnosis not present

## 2022-10-22 DIAGNOSIS — N186 End stage renal disease: Secondary | ICD-10-CM | POA: Diagnosis not present

## 2022-10-22 DIAGNOSIS — Z992 Dependence on renal dialysis: Secondary | ICD-10-CM | POA: Diagnosis not present

## 2022-10-24 DIAGNOSIS — Z992 Dependence on renal dialysis: Secondary | ICD-10-CM | POA: Diagnosis not present

## 2022-10-24 DIAGNOSIS — D509 Iron deficiency anemia, unspecified: Secondary | ICD-10-CM | POA: Diagnosis not present

## 2022-10-24 DIAGNOSIS — N2581 Secondary hyperparathyroidism of renal origin: Secondary | ICD-10-CM | POA: Diagnosis not present

## 2022-10-24 DIAGNOSIS — D631 Anemia in chronic kidney disease: Secondary | ICD-10-CM | POA: Diagnosis not present

## 2022-10-24 DIAGNOSIS — N186 End stage renal disease: Secondary | ICD-10-CM | POA: Diagnosis not present

## 2022-10-26 DIAGNOSIS — D631 Anemia in chronic kidney disease: Secondary | ICD-10-CM | POA: Diagnosis not present

## 2022-10-26 DIAGNOSIS — N186 End stage renal disease: Secondary | ICD-10-CM | POA: Diagnosis not present

## 2022-10-26 DIAGNOSIS — N2581 Secondary hyperparathyroidism of renal origin: Secondary | ICD-10-CM | POA: Diagnosis not present

## 2022-10-26 DIAGNOSIS — Z992 Dependence on renal dialysis: Secondary | ICD-10-CM | POA: Diagnosis not present

## 2022-10-26 DIAGNOSIS — D509 Iron deficiency anemia, unspecified: Secondary | ICD-10-CM | POA: Diagnosis not present

## 2022-10-29 DIAGNOSIS — D631 Anemia in chronic kidney disease: Secondary | ICD-10-CM | POA: Diagnosis not present

## 2022-10-29 DIAGNOSIS — N186 End stage renal disease: Secondary | ICD-10-CM | POA: Diagnosis not present

## 2022-10-29 DIAGNOSIS — Z992 Dependence on renal dialysis: Secondary | ICD-10-CM | POA: Diagnosis not present

## 2022-10-29 DIAGNOSIS — N2581 Secondary hyperparathyroidism of renal origin: Secondary | ICD-10-CM | POA: Diagnosis not present

## 2022-10-29 DIAGNOSIS — D509 Iron deficiency anemia, unspecified: Secondary | ICD-10-CM | POA: Diagnosis not present

## 2022-10-31 DIAGNOSIS — D509 Iron deficiency anemia, unspecified: Secondary | ICD-10-CM | POA: Diagnosis not present

## 2022-10-31 DIAGNOSIS — N2581 Secondary hyperparathyroidism of renal origin: Secondary | ICD-10-CM | POA: Diagnosis not present

## 2022-10-31 DIAGNOSIS — D631 Anemia in chronic kidney disease: Secondary | ICD-10-CM | POA: Diagnosis not present

## 2022-10-31 DIAGNOSIS — N186 End stage renal disease: Secondary | ICD-10-CM | POA: Diagnosis not present

## 2022-10-31 DIAGNOSIS — Z992 Dependence on renal dialysis: Secondary | ICD-10-CM | POA: Diagnosis not present

## 2022-11-02 DIAGNOSIS — Z992 Dependence on renal dialysis: Secondary | ICD-10-CM | POA: Diagnosis not present

## 2022-11-02 DIAGNOSIS — N2581 Secondary hyperparathyroidism of renal origin: Secondary | ICD-10-CM | POA: Diagnosis not present

## 2022-11-02 DIAGNOSIS — D631 Anemia in chronic kidney disease: Secondary | ICD-10-CM | POA: Diagnosis not present

## 2022-11-02 DIAGNOSIS — N186 End stage renal disease: Secondary | ICD-10-CM | POA: Diagnosis not present

## 2022-11-05 DIAGNOSIS — N186 End stage renal disease: Secondary | ICD-10-CM | POA: Diagnosis not present

## 2022-11-05 DIAGNOSIS — N2581 Secondary hyperparathyroidism of renal origin: Secondary | ICD-10-CM | POA: Diagnosis not present

## 2022-11-05 DIAGNOSIS — D631 Anemia in chronic kidney disease: Secondary | ICD-10-CM | POA: Diagnosis not present

## 2022-11-05 DIAGNOSIS — Z992 Dependence on renal dialysis: Secondary | ICD-10-CM | POA: Diagnosis not present

## 2022-11-07 DIAGNOSIS — D631 Anemia in chronic kidney disease: Secondary | ICD-10-CM | POA: Diagnosis not present

## 2022-11-07 DIAGNOSIS — N2581 Secondary hyperparathyroidism of renal origin: Secondary | ICD-10-CM | POA: Diagnosis not present

## 2022-11-07 DIAGNOSIS — N186 End stage renal disease: Secondary | ICD-10-CM | POA: Diagnosis not present

## 2022-11-07 DIAGNOSIS — Z992 Dependence on renal dialysis: Secondary | ICD-10-CM | POA: Diagnosis not present

## 2022-11-08 DIAGNOSIS — Z299 Encounter for prophylactic measures, unspecified: Secondary | ICD-10-CM | POA: Diagnosis not present

## 2022-11-08 DIAGNOSIS — N186 End stage renal disease: Secondary | ICD-10-CM | POA: Diagnosis not present

## 2022-11-08 DIAGNOSIS — I10 Essential (primary) hypertension: Secondary | ICD-10-CM | POA: Diagnosis not present

## 2022-11-08 DIAGNOSIS — E1165 Type 2 diabetes mellitus with hyperglycemia: Secondary | ICD-10-CM | POA: Diagnosis not present

## 2022-11-08 DIAGNOSIS — E1122 Type 2 diabetes mellitus with diabetic chronic kidney disease: Secondary | ICD-10-CM | POA: Diagnosis not present

## 2022-11-09 DIAGNOSIS — D631 Anemia in chronic kidney disease: Secondary | ICD-10-CM | POA: Diagnosis not present

## 2022-11-09 DIAGNOSIS — Z992 Dependence on renal dialysis: Secondary | ICD-10-CM | POA: Diagnosis not present

## 2022-11-09 DIAGNOSIS — N2581 Secondary hyperparathyroidism of renal origin: Secondary | ICD-10-CM | POA: Diagnosis not present

## 2022-11-09 DIAGNOSIS — N186 End stage renal disease: Secondary | ICD-10-CM | POA: Diagnosis not present

## 2022-11-12 DIAGNOSIS — D631 Anemia in chronic kidney disease: Secondary | ICD-10-CM | POA: Diagnosis not present

## 2022-11-12 DIAGNOSIS — N2581 Secondary hyperparathyroidism of renal origin: Secondary | ICD-10-CM | POA: Diagnosis not present

## 2022-11-12 DIAGNOSIS — Z992 Dependence on renal dialysis: Secondary | ICD-10-CM | POA: Diagnosis not present

## 2022-11-12 DIAGNOSIS — N186 End stage renal disease: Secondary | ICD-10-CM | POA: Diagnosis not present

## 2022-11-14 DIAGNOSIS — Z992 Dependence on renal dialysis: Secondary | ICD-10-CM | POA: Diagnosis not present

## 2022-11-14 DIAGNOSIS — N186 End stage renal disease: Secondary | ICD-10-CM | POA: Diagnosis not present

## 2022-11-14 DIAGNOSIS — N2581 Secondary hyperparathyroidism of renal origin: Secondary | ICD-10-CM | POA: Diagnosis not present

## 2022-11-14 DIAGNOSIS — D631 Anemia in chronic kidney disease: Secondary | ICD-10-CM | POA: Diagnosis not present

## 2022-11-16 DIAGNOSIS — N186 End stage renal disease: Secondary | ICD-10-CM | POA: Diagnosis not present

## 2022-11-16 DIAGNOSIS — Z992 Dependence on renal dialysis: Secondary | ICD-10-CM | POA: Diagnosis not present

## 2022-11-16 DIAGNOSIS — N2581 Secondary hyperparathyroidism of renal origin: Secondary | ICD-10-CM | POA: Diagnosis not present

## 2022-11-16 DIAGNOSIS — D631 Anemia in chronic kidney disease: Secondary | ICD-10-CM | POA: Diagnosis not present

## 2022-11-19 DIAGNOSIS — D631 Anemia in chronic kidney disease: Secondary | ICD-10-CM | POA: Diagnosis not present

## 2022-11-19 DIAGNOSIS — N2581 Secondary hyperparathyroidism of renal origin: Secondary | ICD-10-CM | POA: Diagnosis not present

## 2022-11-19 DIAGNOSIS — N186 End stage renal disease: Secondary | ICD-10-CM | POA: Diagnosis not present

## 2022-11-19 DIAGNOSIS — Z992 Dependence on renal dialysis: Secondary | ICD-10-CM | POA: Diagnosis not present

## 2022-11-21 DIAGNOSIS — Z992 Dependence on renal dialysis: Secondary | ICD-10-CM | POA: Diagnosis not present

## 2022-11-21 DIAGNOSIS — D631 Anemia in chronic kidney disease: Secondary | ICD-10-CM | POA: Diagnosis not present

## 2022-11-21 DIAGNOSIS — N2581 Secondary hyperparathyroidism of renal origin: Secondary | ICD-10-CM | POA: Diagnosis not present

## 2022-11-21 DIAGNOSIS — N186 End stage renal disease: Secondary | ICD-10-CM | POA: Diagnosis not present

## 2022-11-23 DIAGNOSIS — Z992 Dependence on renal dialysis: Secondary | ICD-10-CM | POA: Diagnosis not present

## 2022-11-23 DIAGNOSIS — N2581 Secondary hyperparathyroidism of renal origin: Secondary | ICD-10-CM | POA: Diagnosis not present

## 2022-11-23 DIAGNOSIS — N186 End stage renal disease: Secondary | ICD-10-CM | POA: Diagnosis not present

## 2022-11-23 DIAGNOSIS — D631 Anemia in chronic kidney disease: Secondary | ICD-10-CM | POA: Diagnosis not present

## 2022-11-26 DIAGNOSIS — N186 End stage renal disease: Secondary | ICD-10-CM | POA: Diagnosis not present

## 2022-11-26 DIAGNOSIS — N2581 Secondary hyperparathyroidism of renal origin: Secondary | ICD-10-CM | POA: Diagnosis not present

## 2022-11-26 DIAGNOSIS — D631 Anemia in chronic kidney disease: Secondary | ICD-10-CM | POA: Diagnosis not present

## 2022-11-26 DIAGNOSIS — Z992 Dependence on renal dialysis: Secondary | ICD-10-CM | POA: Diagnosis not present

## 2022-11-28 DIAGNOSIS — D631 Anemia in chronic kidney disease: Secondary | ICD-10-CM | POA: Diagnosis not present

## 2022-11-28 DIAGNOSIS — Z992 Dependence on renal dialysis: Secondary | ICD-10-CM | POA: Diagnosis not present

## 2022-11-28 DIAGNOSIS — N2581 Secondary hyperparathyroidism of renal origin: Secondary | ICD-10-CM | POA: Diagnosis not present

## 2022-11-28 DIAGNOSIS — N186 End stage renal disease: Secondary | ICD-10-CM | POA: Diagnosis not present

## 2022-11-30 DIAGNOSIS — Z992 Dependence on renal dialysis: Secondary | ICD-10-CM | POA: Diagnosis not present

## 2022-11-30 DIAGNOSIS — N186 End stage renal disease: Secondary | ICD-10-CM | POA: Diagnosis not present

## 2022-11-30 DIAGNOSIS — N2581 Secondary hyperparathyroidism of renal origin: Secondary | ICD-10-CM | POA: Diagnosis not present

## 2022-11-30 DIAGNOSIS — D631 Anemia in chronic kidney disease: Secondary | ICD-10-CM | POA: Diagnosis not present

## 2022-12-01 DIAGNOSIS — N186 End stage renal disease: Secondary | ICD-10-CM | POA: Diagnosis not present

## 2022-12-01 DIAGNOSIS — Z992 Dependence on renal dialysis: Secondary | ICD-10-CM | POA: Diagnosis not present

## 2022-12-03 DIAGNOSIS — N186 End stage renal disease: Secondary | ICD-10-CM | POA: Diagnosis not present

## 2022-12-03 DIAGNOSIS — D509 Iron deficiency anemia, unspecified: Secondary | ICD-10-CM | POA: Diagnosis not present

## 2022-12-03 DIAGNOSIS — D631 Anemia in chronic kidney disease: Secondary | ICD-10-CM | POA: Diagnosis not present

## 2022-12-03 DIAGNOSIS — Z992 Dependence on renal dialysis: Secondary | ICD-10-CM | POA: Diagnosis not present

## 2022-12-03 DIAGNOSIS — N2581 Secondary hyperparathyroidism of renal origin: Secondary | ICD-10-CM | POA: Diagnosis not present

## 2022-12-03 DIAGNOSIS — T82590A Other mechanical complication of surgically created arteriovenous fistula, initial encounter: Secondary | ICD-10-CM | POA: Diagnosis not present

## 2022-12-05 DIAGNOSIS — D631 Anemia in chronic kidney disease: Secondary | ICD-10-CM | POA: Diagnosis not present

## 2022-12-05 DIAGNOSIS — Z992 Dependence on renal dialysis: Secondary | ICD-10-CM | POA: Diagnosis not present

## 2022-12-05 DIAGNOSIS — D509 Iron deficiency anemia, unspecified: Secondary | ICD-10-CM | POA: Diagnosis not present

## 2022-12-05 DIAGNOSIS — T82590A Other mechanical complication of surgically created arteriovenous fistula, initial encounter: Secondary | ICD-10-CM | POA: Diagnosis not present

## 2022-12-05 DIAGNOSIS — N186 End stage renal disease: Secondary | ICD-10-CM | POA: Diagnosis not present

## 2022-12-05 DIAGNOSIS — N2581 Secondary hyperparathyroidism of renal origin: Secondary | ICD-10-CM | POA: Diagnosis not present

## 2022-12-07 DIAGNOSIS — N2581 Secondary hyperparathyroidism of renal origin: Secondary | ICD-10-CM | POA: Diagnosis not present

## 2022-12-07 DIAGNOSIS — D631 Anemia in chronic kidney disease: Secondary | ICD-10-CM | POA: Diagnosis not present

## 2022-12-07 DIAGNOSIS — N186 End stage renal disease: Secondary | ICD-10-CM | POA: Diagnosis not present

## 2022-12-07 DIAGNOSIS — D509 Iron deficiency anemia, unspecified: Secondary | ICD-10-CM | POA: Diagnosis not present

## 2022-12-07 DIAGNOSIS — Z992 Dependence on renal dialysis: Secondary | ICD-10-CM | POA: Diagnosis not present

## 2022-12-07 DIAGNOSIS — T82590A Other mechanical complication of surgically created arteriovenous fistula, initial encounter: Secondary | ICD-10-CM | POA: Diagnosis not present

## 2022-12-10 DIAGNOSIS — T82590A Other mechanical complication of surgically created arteriovenous fistula, initial encounter: Secondary | ICD-10-CM | POA: Diagnosis not present

## 2022-12-10 DIAGNOSIS — D509 Iron deficiency anemia, unspecified: Secondary | ICD-10-CM | POA: Diagnosis not present

## 2022-12-10 DIAGNOSIS — Z992 Dependence on renal dialysis: Secondary | ICD-10-CM | POA: Diagnosis not present

## 2022-12-10 DIAGNOSIS — N2581 Secondary hyperparathyroidism of renal origin: Secondary | ICD-10-CM | POA: Diagnosis not present

## 2022-12-10 DIAGNOSIS — D631 Anemia in chronic kidney disease: Secondary | ICD-10-CM | POA: Diagnosis not present

## 2022-12-10 DIAGNOSIS — N186 End stage renal disease: Secondary | ICD-10-CM | POA: Diagnosis not present

## 2022-12-12 DIAGNOSIS — T82590A Other mechanical complication of surgically created arteriovenous fistula, initial encounter: Secondary | ICD-10-CM | POA: Diagnosis not present

## 2022-12-12 DIAGNOSIS — N2581 Secondary hyperparathyroidism of renal origin: Secondary | ICD-10-CM | POA: Diagnosis not present

## 2022-12-12 DIAGNOSIS — D631 Anemia in chronic kidney disease: Secondary | ICD-10-CM | POA: Diagnosis not present

## 2022-12-12 DIAGNOSIS — N186 End stage renal disease: Secondary | ICD-10-CM | POA: Diagnosis not present

## 2022-12-12 DIAGNOSIS — D509 Iron deficiency anemia, unspecified: Secondary | ICD-10-CM | POA: Diagnosis not present

## 2022-12-12 DIAGNOSIS — Z992 Dependence on renal dialysis: Secondary | ICD-10-CM | POA: Diagnosis not present

## 2022-12-14 DIAGNOSIS — D631 Anemia in chronic kidney disease: Secondary | ICD-10-CM | POA: Diagnosis not present

## 2022-12-14 DIAGNOSIS — Z992 Dependence on renal dialysis: Secondary | ICD-10-CM | POA: Diagnosis not present

## 2022-12-14 DIAGNOSIS — N186 End stage renal disease: Secondary | ICD-10-CM | POA: Diagnosis not present

## 2022-12-14 DIAGNOSIS — D509 Iron deficiency anemia, unspecified: Secondary | ICD-10-CM | POA: Diagnosis not present

## 2022-12-14 DIAGNOSIS — T82590A Other mechanical complication of surgically created arteriovenous fistula, initial encounter: Secondary | ICD-10-CM | POA: Diagnosis not present

## 2022-12-14 DIAGNOSIS — N2581 Secondary hyperparathyroidism of renal origin: Secondary | ICD-10-CM | POA: Diagnosis not present

## 2022-12-17 DIAGNOSIS — D509 Iron deficiency anemia, unspecified: Secondary | ICD-10-CM | POA: Diagnosis not present

## 2022-12-17 DIAGNOSIS — T82590A Other mechanical complication of surgically created arteriovenous fistula, initial encounter: Secondary | ICD-10-CM | POA: Diagnosis not present

## 2022-12-17 DIAGNOSIS — D631 Anemia in chronic kidney disease: Secondary | ICD-10-CM | POA: Diagnosis not present

## 2022-12-17 DIAGNOSIS — Z992 Dependence on renal dialysis: Secondary | ICD-10-CM | POA: Diagnosis not present

## 2022-12-17 DIAGNOSIS — N186 End stage renal disease: Secondary | ICD-10-CM | POA: Diagnosis not present

## 2022-12-17 DIAGNOSIS — N2581 Secondary hyperparathyroidism of renal origin: Secondary | ICD-10-CM | POA: Diagnosis not present

## 2022-12-18 DIAGNOSIS — Z992 Dependence on renal dialysis: Secondary | ICD-10-CM | POA: Diagnosis not present

## 2022-12-18 DIAGNOSIS — L600 Ingrowing nail: Secondary | ICD-10-CM | POA: Diagnosis not present

## 2022-12-18 DIAGNOSIS — E1140 Type 2 diabetes mellitus with diabetic neuropathy, unspecified: Secondary | ICD-10-CM | POA: Diagnosis not present

## 2022-12-19 DIAGNOSIS — T82590A Other mechanical complication of surgically created arteriovenous fistula, initial encounter: Secondary | ICD-10-CM | POA: Diagnosis not present

## 2022-12-19 DIAGNOSIS — D509 Iron deficiency anemia, unspecified: Secondary | ICD-10-CM | POA: Diagnosis not present

## 2022-12-19 DIAGNOSIS — N186 End stage renal disease: Secondary | ICD-10-CM | POA: Diagnosis not present

## 2022-12-19 DIAGNOSIS — Z992 Dependence on renal dialysis: Secondary | ICD-10-CM | POA: Diagnosis not present

## 2022-12-19 DIAGNOSIS — N2581 Secondary hyperparathyroidism of renal origin: Secondary | ICD-10-CM | POA: Diagnosis not present

## 2022-12-19 DIAGNOSIS — D631 Anemia in chronic kidney disease: Secondary | ICD-10-CM | POA: Diagnosis not present

## 2022-12-21 DIAGNOSIS — D509 Iron deficiency anemia, unspecified: Secondary | ICD-10-CM | POA: Diagnosis not present

## 2022-12-21 DIAGNOSIS — T82590A Other mechanical complication of surgically created arteriovenous fistula, initial encounter: Secondary | ICD-10-CM | POA: Diagnosis not present

## 2022-12-21 DIAGNOSIS — D631 Anemia in chronic kidney disease: Secondary | ICD-10-CM | POA: Diagnosis not present

## 2022-12-21 DIAGNOSIS — N186 End stage renal disease: Secondary | ICD-10-CM | POA: Diagnosis not present

## 2022-12-21 DIAGNOSIS — Z992 Dependence on renal dialysis: Secondary | ICD-10-CM | POA: Diagnosis not present

## 2022-12-21 DIAGNOSIS — N2581 Secondary hyperparathyroidism of renal origin: Secondary | ICD-10-CM | POA: Diagnosis not present

## 2022-12-24 DIAGNOSIS — N2581 Secondary hyperparathyroidism of renal origin: Secondary | ICD-10-CM | POA: Diagnosis not present

## 2022-12-24 DIAGNOSIS — D509 Iron deficiency anemia, unspecified: Secondary | ICD-10-CM | POA: Diagnosis not present

## 2022-12-24 DIAGNOSIS — N186 End stage renal disease: Secondary | ICD-10-CM | POA: Diagnosis not present

## 2022-12-24 DIAGNOSIS — Z992 Dependence on renal dialysis: Secondary | ICD-10-CM | POA: Diagnosis not present

## 2022-12-24 DIAGNOSIS — T82590A Other mechanical complication of surgically created arteriovenous fistula, initial encounter: Secondary | ICD-10-CM | POA: Diagnosis not present

## 2022-12-24 DIAGNOSIS — D631 Anemia in chronic kidney disease: Secondary | ICD-10-CM | POA: Diagnosis not present

## 2022-12-26 DIAGNOSIS — N186 End stage renal disease: Secondary | ICD-10-CM | POA: Diagnosis not present

## 2022-12-26 DIAGNOSIS — D509 Iron deficiency anemia, unspecified: Secondary | ICD-10-CM | POA: Diagnosis not present

## 2022-12-26 DIAGNOSIS — D631 Anemia in chronic kidney disease: Secondary | ICD-10-CM | POA: Diagnosis not present

## 2022-12-26 DIAGNOSIS — T82590A Other mechanical complication of surgically created arteriovenous fistula, initial encounter: Secondary | ICD-10-CM | POA: Diagnosis not present

## 2022-12-26 DIAGNOSIS — Z992 Dependence on renal dialysis: Secondary | ICD-10-CM | POA: Diagnosis not present

## 2022-12-26 DIAGNOSIS — N2581 Secondary hyperparathyroidism of renal origin: Secondary | ICD-10-CM | POA: Diagnosis not present

## 2022-12-27 DIAGNOSIS — N186 End stage renal disease: Secondary | ICD-10-CM | POA: Diagnosis not present

## 2022-12-27 DIAGNOSIS — I10 Essential (primary) hypertension: Secondary | ICD-10-CM | POA: Diagnosis not present

## 2022-12-27 DIAGNOSIS — Z01810 Encounter for preprocedural cardiovascular examination: Secondary | ICD-10-CM | POA: Diagnosis not present

## 2022-12-28 DIAGNOSIS — N186 End stage renal disease: Secondary | ICD-10-CM | POA: Diagnosis not present

## 2022-12-28 DIAGNOSIS — D509 Iron deficiency anemia, unspecified: Secondary | ICD-10-CM | POA: Diagnosis not present

## 2022-12-28 DIAGNOSIS — N2581 Secondary hyperparathyroidism of renal origin: Secondary | ICD-10-CM | POA: Diagnosis not present

## 2022-12-28 DIAGNOSIS — T82590A Other mechanical complication of surgically created arteriovenous fistula, initial encounter: Secondary | ICD-10-CM | POA: Diagnosis not present

## 2022-12-28 DIAGNOSIS — Z992 Dependence on renal dialysis: Secondary | ICD-10-CM | POA: Diagnosis not present

## 2022-12-28 DIAGNOSIS — D631 Anemia in chronic kidney disease: Secondary | ICD-10-CM | POA: Diagnosis not present

## 2022-12-31 DIAGNOSIS — N2581 Secondary hyperparathyroidism of renal origin: Secondary | ICD-10-CM | POA: Diagnosis not present

## 2022-12-31 DIAGNOSIS — N186 End stage renal disease: Secondary | ICD-10-CM | POA: Diagnosis not present

## 2022-12-31 DIAGNOSIS — D509 Iron deficiency anemia, unspecified: Secondary | ICD-10-CM | POA: Diagnosis not present

## 2022-12-31 DIAGNOSIS — T82590A Other mechanical complication of surgically created arteriovenous fistula, initial encounter: Secondary | ICD-10-CM | POA: Diagnosis not present

## 2022-12-31 DIAGNOSIS — D631 Anemia in chronic kidney disease: Secondary | ICD-10-CM | POA: Diagnosis not present

## 2022-12-31 DIAGNOSIS — Z992 Dependence on renal dialysis: Secondary | ICD-10-CM | POA: Diagnosis not present

## 2023-01-02 DIAGNOSIS — Z23 Encounter for immunization: Secondary | ICD-10-CM | POA: Diagnosis not present

## 2023-01-02 DIAGNOSIS — N186 End stage renal disease: Secondary | ICD-10-CM | POA: Diagnosis not present

## 2023-01-02 DIAGNOSIS — Z992 Dependence on renal dialysis: Secondary | ICD-10-CM | POA: Diagnosis not present

## 2023-01-02 DIAGNOSIS — T82590A Other mechanical complication of surgically created arteriovenous fistula, initial encounter: Secondary | ICD-10-CM | POA: Diagnosis not present

## 2023-01-02 DIAGNOSIS — D631 Anemia in chronic kidney disease: Secondary | ICD-10-CM | POA: Diagnosis not present

## 2023-01-02 DIAGNOSIS — N2581 Secondary hyperparathyroidism of renal origin: Secondary | ICD-10-CM | POA: Diagnosis not present

## 2023-01-04 DIAGNOSIS — Z992 Dependence on renal dialysis: Secondary | ICD-10-CM | POA: Diagnosis not present

## 2023-01-04 DIAGNOSIS — N186 End stage renal disease: Secondary | ICD-10-CM | POA: Diagnosis not present

## 2023-01-04 DIAGNOSIS — Z23 Encounter for immunization: Secondary | ICD-10-CM | POA: Diagnosis not present

## 2023-01-04 DIAGNOSIS — T82590A Other mechanical complication of surgically created arteriovenous fistula, initial encounter: Secondary | ICD-10-CM | POA: Diagnosis not present

## 2023-01-04 DIAGNOSIS — N2581 Secondary hyperparathyroidism of renal origin: Secondary | ICD-10-CM | POA: Diagnosis not present

## 2023-01-04 DIAGNOSIS — D631 Anemia in chronic kidney disease: Secondary | ICD-10-CM | POA: Diagnosis not present

## 2023-01-07 DIAGNOSIS — N2581 Secondary hyperparathyroidism of renal origin: Secondary | ICD-10-CM | POA: Diagnosis not present

## 2023-01-07 DIAGNOSIS — Z23 Encounter for immunization: Secondary | ICD-10-CM | POA: Diagnosis not present

## 2023-01-07 DIAGNOSIS — D631 Anemia in chronic kidney disease: Secondary | ICD-10-CM | POA: Diagnosis not present

## 2023-01-07 DIAGNOSIS — T82590A Other mechanical complication of surgically created arteriovenous fistula, initial encounter: Secondary | ICD-10-CM | POA: Diagnosis not present

## 2023-01-07 DIAGNOSIS — Z992 Dependence on renal dialysis: Secondary | ICD-10-CM | POA: Diagnosis not present

## 2023-01-07 DIAGNOSIS — N186 End stage renal disease: Secondary | ICD-10-CM | POA: Diagnosis not present

## 2023-01-09 DIAGNOSIS — Z23 Encounter for immunization: Secondary | ICD-10-CM | POA: Diagnosis not present

## 2023-01-09 DIAGNOSIS — D631 Anemia in chronic kidney disease: Secondary | ICD-10-CM | POA: Diagnosis not present

## 2023-01-09 DIAGNOSIS — T82590A Other mechanical complication of surgically created arteriovenous fistula, initial encounter: Secondary | ICD-10-CM | POA: Diagnosis not present

## 2023-01-09 DIAGNOSIS — N186 End stage renal disease: Secondary | ICD-10-CM | POA: Diagnosis not present

## 2023-01-09 DIAGNOSIS — N2581 Secondary hyperparathyroidism of renal origin: Secondary | ICD-10-CM | POA: Diagnosis not present

## 2023-01-09 DIAGNOSIS — Z992 Dependence on renal dialysis: Secondary | ICD-10-CM | POA: Diagnosis not present

## 2023-01-11 DIAGNOSIS — D631 Anemia in chronic kidney disease: Secondary | ICD-10-CM | POA: Diagnosis not present

## 2023-01-11 DIAGNOSIS — T82590A Other mechanical complication of surgically created arteriovenous fistula, initial encounter: Secondary | ICD-10-CM | POA: Diagnosis not present

## 2023-01-11 DIAGNOSIS — N2581 Secondary hyperparathyroidism of renal origin: Secondary | ICD-10-CM | POA: Diagnosis not present

## 2023-01-11 DIAGNOSIS — Z23 Encounter for immunization: Secondary | ICD-10-CM | POA: Diagnosis not present

## 2023-01-11 DIAGNOSIS — Z992 Dependence on renal dialysis: Secondary | ICD-10-CM | POA: Diagnosis not present

## 2023-01-11 DIAGNOSIS — N186 End stage renal disease: Secondary | ICD-10-CM | POA: Diagnosis not present

## 2023-01-14 DIAGNOSIS — N2581 Secondary hyperparathyroidism of renal origin: Secondary | ICD-10-CM | POA: Diagnosis not present

## 2023-01-14 DIAGNOSIS — N186 End stage renal disease: Secondary | ICD-10-CM | POA: Diagnosis not present

## 2023-01-14 DIAGNOSIS — Z992 Dependence on renal dialysis: Secondary | ICD-10-CM | POA: Diagnosis not present

## 2023-01-14 DIAGNOSIS — Z23 Encounter for immunization: Secondary | ICD-10-CM | POA: Diagnosis not present

## 2023-01-14 DIAGNOSIS — T82590A Other mechanical complication of surgically created arteriovenous fistula, initial encounter: Secondary | ICD-10-CM | POA: Diagnosis not present

## 2023-01-14 DIAGNOSIS — D631 Anemia in chronic kidney disease: Secondary | ICD-10-CM | POA: Diagnosis not present

## 2023-01-16 DIAGNOSIS — D631 Anemia in chronic kidney disease: Secondary | ICD-10-CM | POA: Diagnosis not present

## 2023-01-16 DIAGNOSIS — N186 End stage renal disease: Secondary | ICD-10-CM | POA: Diagnosis not present

## 2023-01-16 DIAGNOSIS — N2581 Secondary hyperparathyroidism of renal origin: Secondary | ICD-10-CM | POA: Diagnosis not present

## 2023-01-16 DIAGNOSIS — Z992 Dependence on renal dialysis: Secondary | ICD-10-CM | POA: Diagnosis not present

## 2023-01-16 DIAGNOSIS — T82590A Other mechanical complication of surgically created arteriovenous fistula, initial encounter: Secondary | ICD-10-CM | POA: Diagnosis not present

## 2023-01-16 DIAGNOSIS — Z23 Encounter for immunization: Secondary | ICD-10-CM | POA: Diagnosis not present

## 2023-01-18 DIAGNOSIS — D631 Anemia in chronic kidney disease: Secondary | ICD-10-CM | POA: Diagnosis not present

## 2023-01-18 DIAGNOSIS — T82590A Other mechanical complication of surgically created arteriovenous fistula, initial encounter: Secondary | ICD-10-CM | POA: Diagnosis not present

## 2023-01-18 DIAGNOSIS — N186 End stage renal disease: Secondary | ICD-10-CM | POA: Diagnosis not present

## 2023-01-18 DIAGNOSIS — Z23 Encounter for immunization: Secondary | ICD-10-CM | POA: Diagnosis not present

## 2023-01-18 DIAGNOSIS — N2581 Secondary hyperparathyroidism of renal origin: Secondary | ICD-10-CM | POA: Diagnosis not present

## 2023-01-18 DIAGNOSIS — Z992 Dependence on renal dialysis: Secondary | ICD-10-CM | POA: Diagnosis not present

## 2023-01-19 DIAGNOSIS — R29700 NIHSS score 0: Secondary | ICD-10-CM | POA: Diagnosis not present

## 2023-01-19 DIAGNOSIS — R531 Weakness: Secondary | ICD-10-CM | POA: Diagnosis not present

## 2023-01-19 DIAGNOSIS — N189 Chronic kidney disease, unspecified: Secondary | ICD-10-CM | POA: Diagnosis not present

## 2023-01-19 DIAGNOSIS — Z20822 Contact with and (suspected) exposure to covid-19: Secondary | ICD-10-CM | POA: Diagnosis not present

## 2023-01-19 DIAGNOSIS — N186 End stage renal disease: Secondary | ICD-10-CM | POA: Diagnosis not present

## 2023-01-19 DIAGNOSIS — Z992 Dependence on renal dialysis: Secondary | ICD-10-CM | POA: Diagnosis not present

## 2023-01-19 DIAGNOSIS — I120 Hypertensive chronic kidney disease with stage 5 chronic kidney disease or end stage renal disease: Secondary | ICD-10-CM | POA: Diagnosis not present

## 2023-01-19 DIAGNOSIS — D649 Anemia, unspecified: Secondary | ICD-10-CM | POA: Diagnosis not present

## 2023-01-19 DIAGNOSIS — R5381 Other malaise: Secondary | ICD-10-CM | POA: Diagnosis not present

## 2023-01-19 DIAGNOSIS — Z1152 Encounter for screening for COVID-19: Secondary | ICD-10-CM | POA: Diagnosis not present

## 2023-01-19 DIAGNOSIS — Z794 Long term (current) use of insulin: Secondary | ICD-10-CM | POA: Diagnosis not present

## 2023-01-19 DIAGNOSIS — M6281 Muscle weakness (generalized): Secondary | ICD-10-CM | POA: Diagnosis not present

## 2023-01-19 DIAGNOSIS — E1122 Type 2 diabetes mellitus with diabetic chronic kidney disease: Secondary | ICD-10-CM | POA: Diagnosis not present

## 2023-01-19 DIAGNOSIS — Z7982 Long term (current) use of aspirin: Secondary | ICD-10-CM | POA: Diagnosis not present

## 2023-01-21 DIAGNOSIS — Z23 Encounter for immunization: Secondary | ICD-10-CM | POA: Diagnosis not present

## 2023-01-21 DIAGNOSIS — N2581 Secondary hyperparathyroidism of renal origin: Secondary | ICD-10-CM | POA: Diagnosis not present

## 2023-01-21 DIAGNOSIS — N186 End stage renal disease: Secondary | ICD-10-CM | POA: Diagnosis not present

## 2023-01-21 DIAGNOSIS — D631 Anemia in chronic kidney disease: Secondary | ICD-10-CM | POA: Diagnosis not present

## 2023-01-21 DIAGNOSIS — T82590A Other mechanical complication of surgically created arteriovenous fistula, initial encounter: Secondary | ICD-10-CM | POA: Diagnosis not present

## 2023-01-21 DIAGNOSIS — Z992 Dependence on renal dialysis: Secondary | ICD-10-CM | POA: Diagnosis not present

## 2023-01-22 DIAGNOSIS — Z1331 Encounter for screening for depression: Secondary | ICD-10-CM | POA: Diagnosis not present

## 2023-01-22 DIAGNOSIS — R531 Weakness: Secondary | ICD-10-CM | POA: Diagnosis not present

## 2023-01-22 DIAGNOSIS — Z7189 Other specified counseling: Secondary | ICD-10-CM | POA: Diagnosis not present

## 2023-01-22 DIAGNOSIS — I6789 Other cerebrovascular disease: Secondary | ICD-10-CM | POA: Diagnosis not present

## 2023-01-22 DIAGNOSIS — Z0000 Encounter for general adult medical examination without abnormal findings: Secondary | ICD-10-CM | POA: Diagnosis not present

## 2023-01-22 DIAGNOSIS — G8194 Hemiplegia, unspecified affecting left nondominant side: Secondary | ICD-10-CM | POA: Diagnosis not present

## 2023-01-22 DIAGNOSIS — Z299 Encounter for prophylactic measures, unspecified: Secondary | ICD-10-CM | POA: Diagnosis not present

## 2023-01-22 DIAGNOSIS — I672 Cerebral atherosclerosis: Secondary | ICD-10-CM | POA: Diagnosis not present

## 2023-01-22 DIAGNOSIS — E1169 Type 2 diabetes mellitus with other specified complication: Secondary | ICD-10-CM | POA: Diagnosis not present

## 2023-01-22 DIAGNOSIS — Z1339 Encounter for screening examination for other mental health and behavioral disorders: Secondary | ICD-10-CM | POA: Diagnosis not present

## 2023-01-22 DIAGNOSIS — I10 Essential (primary) hypertension: Secondary | ICD-10-CM | POA: Diagnosis not present

## 2023-01-23 DIAGNOSIS — D631 Anemia in chronic kidney disease: Secondary | ICD-10-CM | POA: Diagnosis not present

## 2023-01-23 DIAGNOSIS — T82590A Other mechanical complication of surgically created arteriovenous fistula, initial encounter: Secondary | ICD-10-CM | POA: Diagnosis not present

## 2023-01-23 DIAGNOSIS — Z23 Encounter for immunization: Secondary | ICD-10-CM | POA: Diagnosis not present

## 2023-01-23 DIAGNOSIS — N186 End stage renal disease: Secondary | ICD-10-CM | POA: Diagnosis not present

## 2023-01-23 DIAGNOSIS — Z992 Dependence on renal dialysis: Secondary | ICD-10-CM | POA: Diagnosis not present

## 2023-01-23 DIAGNOSIS — N2581 Secondary hyperparathyroidism of renal origin: Secondary | ICD-10-CM | POA: Diagnosis not present

## 2023-01-25 DIAGNOSIS — Z992 Dependence on renal dialysis: Secondary | ICD-10-CM | POA: Diagnosis not present

## 2023-01-25 DIAGNOSIS — T82590A Other mechanical complication of surgically created arteriovenous fistula, initial encounter: Secondary | ICD-10-CM | POA: Diagnosis not present

## 2023-01-25 DIAGNOSIS — N2581 Secondary hyperparathyroidism of renal origin: Secondary | ICD-10-CM | POA: Diagnosis not present

## 2023-01-25 DIAGNOSIS — D631 Anemia in chronic kidney disease: Secondary | ICD-10-CM | POA: Diagnosis not present

## 2023-01-25 DIAGNOSIS — Z23 Encounter for immunization: Secondary | ICD-10-CM | POA: Diagnosis not present

## 2023-01-25 DIAGNOSIS — N186 End stage renal disease: Secondary | ICD-10-CM | POA: Diagnosis not present

## 2023-01-28 DIAGNOSIS — N186 End stage renal disease: Secondary | ICD-10-CM | POA: Diagnosis not present

## 2023-01-28 DIAGNOSIS — Z23 Encounter for immunization: Secondary | ICD-10-CM | POA: Diagnosis not present

## 2023-01-28 DIAGNOSIS — Z992 Dependence on renal dialysis: Secondary | ICD-10-CM | POA: Diagnosis not present

## 2023-01-28 DIAGNOSIS — D631 Anemia in chronic kidney disease: Secondary | ICD-10-CM | POA: Diagnosis not present

## 2023-01-28 DIAGNOSIS — N2581 Secondary hyperparathyroidism of renal origin: Secondary | ICD-10-CM | POA: Diagnosis not present

## 2023-01-28 DIAGNOSIS — T82590A Other mechanical complication of surgically created arteriovenous fistula, initial encounter: Secondary | ICD-10-CM | POA: Diagnosis not present

## 2023-01-29 DIAGNOSIS — Z992 Dependence on renal dialysis: Secondary | ICD-10-CM | POA: Diagnosis not present

## 2023-01-29 DIAGNOSIS — N186 End stage renal disease: Secondary | ICD-10-CM | POA: Diagnosis not present

## 2023-01-29 DIAGNOSIS — N2581 Secondary hyperparathyroidism of renal origin: Secondary | ICD-10-CM | POA: Diagnosis not present

## 2023-01-30 DIAGNOSIS — Z7902 Long term (current) use of antithrombotics/antiplatelets: Secondary | ICD-10-CM | POA: Diagnosis not present

## 2023-01-30 DIAGNOSIS — N186 End stage renal disease: Secondary | ICD-10-CM | POA: Diagnosis not present

## 2023-01-30 DIAGNOSIS — Z794 Long term (current) use of insulin: Secondary | ICD-10-CM | POA: Diagnosis not present

## 2023-01-30 DIAGNOSIS — E1142 Type 2 diabetes mellitus with diabetic polyneuropathy: Secondary | ICD-10-CM | POA: Diagnosis not present

## 2023-01-30 DIAGNOSIS — T82898A Other specified complication of vascular prosthetic devices, implants and grafts, initial encounter: Secondary | ICD-10-CM | POA: Diagnosis not present

## 2023-01-30 DIAGNOSIS — I69354 Hemiplegia and hemiparesis following cerebral infarction affecting left non-dominant side: Secondary | ICD-10-CM | POA: Diagnosis not present

## 2023-01-30 DIAGNOSIS — E1122 Type 2 diabetes mellitus with diabetic chronic kidney disease: Secondary | ICD-10-CM | POA: Diagnosis not present

## 2023-01-30 DIAGNOSIS — I739 Peripheral vascular disease, unspecified: Secondary | ICD-10-CM | POA: Diagnosis not present

## 2023-01-30 DIAGNOSIS — E7800 Pure hypercholesterolemia, unspecified: Secondary | ICD-10-CM | POA: Diagnosis not present

## 2023-01-30 DIAGNOSIS — Z79899 Other long term (current) drug therapy: Secondary | ICD-10-CM | POA: Diagnosis not present

## 2023-01-30 DIAGNOSIS — G8918 Other acute postprocedural pain: Secondary | ICD-10-CM | POA: Diagnosis not present

## 2023-01-30 DIAGNOSIS — Z992 Dependence on renal dialysis: Secondary | ICD-10-CM | POA: Diagnosis not present

## 2023-01-30 DIAGNOSIS — I120 Hypertensive chronic kidney disease with stage 5 chronic kidney disease or end stage renal disease: Secondary | ICD-10-CM | POA: Diagnosis not present

## 2023-01-31 DIAGNOSIS — Z992 Dependence on renal dialysis: Secondary | ICD-10-CM | POA: Diagnosis not present

## 2023-01-31 DIAGNOSIS — N186 End stage renal disease: Secondary | ICD-10-CM | POA: Diagnosis not present

## 2023-02-01 DIAGNOSIS — D631 Anemia in chronic kidney disease: Secondary | ICD-10-CM | POA: Diagnosis not present

## 2023-02-01 DIAGNOSIS — Z992 Dependence on renal dialysis: Secondary | ICD-10-CM | POA: Diagnosis not present

## 2023-02-01 DIAGNOSIS — N2581 Secondary hyperparathyroidism of renal origin: Secondary | ICD-10-CM | POA: Diagnosis not present

## 2023-02-01 DIAGNOSIS — N186 End stage renal disease: Secondary | ICD-10-CM | POA: Diagnosis not present

## 2023-02-04 DIAGNOSIS — Z992 Dependence on renal dialysis: Secondary | ICD-10-CM | POA: Diagnosis not present

## 2023-02-04 DIAGNOSIS — N2581 Secondary hyperparathyroidism of renal origin: Secondary | ICD-10-CM | POA: Diagnosis not present

## 2023-02-04 DIAGNOSIS — N186 End stage renal disease: Secondary | ICD-10-CM | POA: Diagnosis not present

## 2023-02-04 DIAGNOSIS — D631 Anemia in chronic kidney disease: Secondary | ICD-10-CM | POA: Diagnosis not present

## 2023-02-06 DIAGNOSIS — Z992 Dependence on renal dialysis: Secondary | ICD-10-CM | POA: Diagnosis not present

## 2023-02-06 DIAGNOSIS — D631 Anemia in chronic kidney disease: Secondary | ICD-10-CM | POA: Diagnosis not present

## 2023-02-06 DIAGNOSIS — N186 End stage renal disease: Secondary | ICD-10-CM | POA: Diagnosis not present

## 2023-02-06 DIAGNOSIS — N2581 Secondary hyperparathyroidism of renal origin: Secondary | ICD-10-CM | POA: Diagnosis not present

## 2023-02-08 DIAGNOSIS — N2581 Secondary hyperparathyroidism of renal origin: Secondary | ICD-10-CM | POA: Diagnosis not present

## 2023-02-08 DIAGNOSIS — Z992 Dependence on renal dialysis: Secondary | ICD-10-CM | POA: Diagnosis not present

## 2023-02-08 DIAGNOSIS — N186 End stage renal disease: Secondary | ICD-10-CM | POA: Diagnosis not present

## 2023-02-08 DIAGNOSIS — D631 Anemia in chronic kidney disease: Secondary | ICD-10-CM | POA: Diagnosis not present

## 2023-02-11 DIAGNOSIS — N186 End stage renal disease: Secondary | ICD-10-CM | POA: Diagnosis not present

## 2023-02-11 DIAGNOSIS — D631 Anemia in chronic kidney disease: Secondary | ICD-10-CM | POA: Diagnosis not present

## 2023-02-11 DIAGNOSIS — Z992 Dependence on renal dialysis: Secondary | ICD-10-CM | POA: Diagnosis not present

## 2023-02-11 DIAGNOSIS — N2581 Secondary hyperparathyroidism of renal origin: Secondary | ICD-10-CM | POA: Diagnosis not present

## 2023-02-12 DIAGNOSIS — I10 Essential (primary) hypertension: Secondary | ICD-10-CM | POA: Diagnosis not present

## 2023-02-12 DIAGNOSIS — Z299 Encounter for prophylactic measures, unspecified: Secondary | ICD-10-CM | POA: Diagnosis not present

## 2023-02-12 DIAGNOSIS — R531 Weakness: Secondary | ICD-10-CM | POA: Diagnosis not present

## 2023-02-12 DIAGNOSIS — N186 End stage renal disease: Secondary | ICD-10-CM | POA: Diagnosis not present

## 2023-02-12 DIAGNOSIS — E1169 Type 2 diabetes mellitus with other specified complication: Secondary | ICD-10-CM | POA: Diagnosis not present

## 2023-02-13 DIAGNOSIS — N2581 Secondary hyperparathyroidism of renal origin: Secondary | ICD-10-CM | POA: Diagnosis not present

## 2023-02-13 DIAGNOSIS — N186 End stage renal disease: Secondary | ICD-10-CM | POA: Diagnosis not present

## 2023-02-13 DIAGNOSIS — D631 Anemia in chronic kidney disease: Secondary | ICD-10-CM | POA: Diagnosis not present

## 2023-02-13 DIAGNOSIS — Z992 Dependence on renal dialysis: Secondary | ICD-10-CM | POA: Diagnosis not present

## 2023-02-14 DIAGNOSIS — E1142 Type 2 diabetes mellitus with diabetic polyneuropathy: Secondary | ICD-10-CM | POA: Diagnosis not present

## 2023-02-14 DIAGNOSIS — I120 Hypertensive chronic kidney disease with stage 5 chronic kidney disease or end stage renal disease: Secondary | ICD-10-CM | POA: Diagnosis not present

## 2023-02-14 DIAGNOSIS — J61 Pneumoconiosis due to asbestos and other mineral fibers: Secondary | ICD-10-CM | POA: Diagnosis not present

## 2023-02-14 DIAGNOSIS — E1122 Type 2 diabetes mellitus with diabetic chronic kidney disease: Secondary | ICD-10-CM | POA: Diagnosis not present

## 2023-02-14 DIAGNOSIS — N186 End stage renal disease: Secondary | ICD-10-CM | POA: Diagnosis not present

## 2023-02-14 DIAGNOSIS — E1165 Type 2 diabetes mellitus with hyperglycemia: Secondary | ICD-10-CM | POA: Diagnosis not present

## 2023-02-14 DIAGNOSIS — Z8631 Personal history of diabetic foot ulcer: Secondary | ICD-10-CM | POA: Diagnosis not present

## 2023-02-14 DIAGNOSIS — E7800 Pure hypercholesterolemia, unspecified: Secondary | ICD-10-CM | POA: Diagnosis not present

## 2023-02-14 DIAGNOSIS — Z794 Long term (current) use of insulin: Secondary | ICD-10-CM | POA: Diagnosis not present

## 2023-02-14 DIAGNOSIS — Z7902 Long term (current) use of antithrombotics/antiplatelets: Secondary | ICD-10-CM | POA: Diagnosis not present

## 2023-02-14 DIAGNOSIS — R32 Unspecified urinary incontinence: Secondary | ICD-10-CM | POA: Diagnosis not present

## 2023-02-14 DIAGNOSIS — I69354 Hemiplegia and hemiparesis following cerebral infarction affecting left non-dominant side: Secondary | ICD-10-CM | POA: Diagnosis not present

## 2023-02-14 DIAGNOSIS — Z992 Dependence on renal dialysis: Secondary | ICD-10-CM | POA: Diagnosis not present

## 2023-02-15 DIAGNOSIS — N2581 Secondary hyperparathyroidism of renal origin: Secondary | ICD-10-CM | POA: Diagnosis not present

## 2023-02-15 DIAGNOSIS — D631 Anemia in chronic kidney disease: Secondary | ICD-10-CM | POA: Diagnosis not present

## 2023-02-15 DIAGNOSIS — N186 End stage renal disease: Secondary | ICD-10-CM | POA: Diagnosis not present

## 2023-02-15 DIAGNOSIS — Z992 Dependence on renal dialysis: Secondary | ICD-10-CM | POA: Diagnosis not present

## 2023-02-18 DIAGNOSIS — D631 Anemia in chronic kidney disease: Secondary | ICD-10-CM | POA: Diagnosis not present

## 2023-02-18 DIAGNOSIS — N186 End stage renal disease: Secondary | ICD-10-CM | POA: Diagnosis not present

## 2023-02-18 DIAGNOSIS — Z992 Dependence on renal dialysis: Secondary | ICD-10-CM | POA: Diagnosis not present

## 2023-02-18 DIAGNOSIS — N2581 Secondary hyperparathyroidism of renal origin: Secondary | ICD-10-CM | POA: Diagnosis not present

## 2023-02-19 DIAGNOSIS — I120 Hypertensive chronic kidney disease with stage 5 chronic kidney disease or end stage renal disease: Secondary | ICD-10-CM | POA: Diagnosis not present

## 2023-02-19 DIAGNOSIS — E1142 Type 2 diabetes mellitus with diabetic polyneuropathy: Secondary | ICD-10-CM | POA: Diagnosis not present

## 2023-02-19 DIAGNOSIS — Z992 Dependence on renal dialysis: Secondary | ICD-10-CM | POA: Diagnosis not present

## 2023-02-19 DIAGNOSIS — E1122 Type 2 diabetes mellitus with diabetic chronic kidney disease: Secondary | ICD-10-CM | POA: Diagnosis not present

## 2023-02-19 DIAGNOSIS — I69354 Hemiplegia and hemiparesis following cerebral infarction affecting left non-dominant side: Secondary | ICD-10-CM | POA: Diagnosis not present

## 2023-02-19 DIAGNOSIS — N186 End stage renal disease: Secondary | ICD-10-CM | POA: Diagnosis not present

## 2023-02-20 DIAGNOSIS — Z992 Dependence on renal dialysis: Secondary | ICD-10-CM | POA: Diagnosis not present

## 2023-02-20 DIAGNOSIS — D631 Anemia in chronic kidney disease: Secondary | ICD-10-CM | POA: Diagnosis not present

## 2023-02-20 DIAGNOSIS — N186 End stage renal disease: Secondary | ICD-10-CM | POA: Diagnosis not present

## 2023-02-20 DIAGNOSIS — N2581 Secondary hyperparathyroidism of renal origin: Secondary | ICD-10-CM | POA: Diagnosis not present

## 2023-02-21 DIAGNOSIS — I69354 Hemiplegia and hemiparesis following cerebral infarction affecting left non-dominant side: Secondary | ICD-10-CM | POA: Diagnosis not present

## 2023-02-21 DIAGNOSIS — N186 End stage renal disease: Secondary | ICD-10-CM | POA: Diagnosis not present

## 2023-02-21 DIAGNOSIS — I120 Hypertensive chronic kidney disease with stage 5 chronic kidney disease or end stage renal disease: Secondary | ICD-10-CM | POA: Diagnosis not present

## 2023-02-21 DIAGNOSIS — E1122 Type 2 diabetes mellitus with diabetic chronic kidney disease: Secondary | ICD-10-CM | POA: Diagnosis not present

## 2023-02-21 DIAGNOSIS — Z992 Dependence on renal dialysis: Secondary | ICD-10-CM | POA: Diagnosis not present

## 2023-02-21 DIAGNOSIS — E1142 Type 2 diabetes mellitus with diabetic polyneuropathy: Secondary | ICD-10-CM | POA: Diagnosis not present

## 2023-02-22 DIAGNOSIS — Z992 Dependence on renal dialysis: Secondary | ICD-10-CM | POA: Diagnosis not present

## 2023-02-22 DIAGNOSIS — D631 Anemia in chronic kidney disease: Secondary | ICD-10-CM | POA: Diagnosis not present

## 2023-02-22 DIAGNOSIS — N2581 Secondary hyperparathyroidism of renal origin: Secondary | ICD-10-CM | POA: Diagnosis not present

## 2023-02-22 DIAGNOSIS — N186 End stage renal disease: Secondary | ICD-10-CM | POA: Diagnosis not present

## 2023-02-23 DIAGNOSIS — E1122 Type 2 diabetes mellitus with diabetic chronic kidney disease: Secondary | ICD-10-CM | POA: Diagnosis not present

## 2023-02-23 DIAGNOSIS — E1142 Type 2 diabetes mellitus with diabetic polyneuropathy: Secondary | ICD-10-CM | POA: Diagnosis not present

## 2023-02-23 DIAGNOSIS — N186 End stage renal disease: Secondary | ICD-10-CM | POA: Diagnosis not present

## 2023-02-23 DIAGNOSIS — I69354 Hemiplegia and hemiparesis following cerebral infarction affecting left non-dominant side: Secondary | ICD-10-CM | POA: Diagnosis not present

## 2023-02-23 DIAGNOSIS — Z992 Dependence on renal dialysis: Secondary | ICD-10-CM | POA: Diagnosis not present

## 2023-02-23 DIAGNOSIS — I120 Hypertensive chronic kidney disease with stage 5 chronic kidney disease or end stage renal disease: Secondary | ICD-10-CM | POA: Diagnosis not present

## 2023-02-24 DIAGNOSIS — E1142 Type 2 diabetes mellitus with diabetic polyneuropathy: Secondary | ICD-10-CM | POA: Diagnosis not present

## 2023-02-24 DIAGNOSIS — E1122 Type 2 diabetes mellitus with diabetic chronic kidney disease: Secondary | ICD-10-CM | POA: Diagnosis not present

## 2023-02-24 DIAGNOSIS — I120 Hypertensive chronic kidney disease with stage 5 chronic kidney disease or end stage renal disease: Secondary | ICD-10-CM | POA: Diagnosis not present

## 2023-02-24 DIAGNOSIS — Z992 Dependence on renal dialysis: Secondary | ICD-10-CM | POA: Diagnosis not present

## 2023-02-24 DIAGNOSIS — N186 End stage renal disease: Secondary | ICD-10-CM | POA: Diagnosis not present

## 2023-02-24 DIAGNOSIS — I69354 Hemiplegia and hemiparesis following cerebral infarction affecting left non-dominant side: Secondary | ICD-10-CM | POA: Diagnosis not present

## 2023-02-25 DIAGNOSIS — Z992 Dependence on renal dialysis: Secondary | ICD-10-CM | POA: Diagnosis not present

## 2023-02-25 DIAGNOSIS — N2581 Secondary hyperparathyroidism of renal origin: Secondary | ICD-10-CM | POA: Diagnosis not present

## 2023-02-25 DIAGNOSIS — N186 End stage renal disease: Secondary | ICD-10-CM | POA: Diagnosis not present

## 2023-02-25 DIAGNOSIS — D631 Anemia in chronic kidney disease: Secondary | ICD-10-CM | POA: Diagnosis not present

## 2023-02-26 DIAGNOSIS — E1122 Type 2 diabetes mellitus with diabetic chronic kidney disease: Secondary | ICD-10-CM | POA: Diagnosis not present

## 2023-02-26 DIAGNOSIS — I69354 Hemiplegia and hemiparesis following cerebral infarction affecting left non-dominant side: Secondary | ICD-10-CM | POA: Diagnosis not present

## 2023-02-26 DIAGNOSIS — N186 End stage renal disease: Secondary | ICD-10-CM | POA: Diagnosis not present

## 2023-02-26 DIAGNOSIS — I120 Hypertensive chronic kidney disease with stage 5 chronic kidney disease or end stage renal disease: Secondary | ICD-10-CM | POA: Diagnosis not present

## 2023-02-26 DIAGNOSIS — Z992 Dependence on renal dialysis: Secondary | ICD-10-CM | POA: Diagnosis not present

## 2023-02-26 DIAGNOSIS — E1142 Type 2 diabetes mellitus with diabetic polyneuropathy: Secondary | ICD-10-CM | POA: Diagnosis not present

## 2023-02-27 DIAGNOSIS — N186 End stage renal disease: Secondary | ICD-10-CM | POA: Diagnosis not present

## 2023-02-27 DIAGNOSIS — Z992 Dependence on renal dialysis: Secondary | ICD-10-CM | POA: Diagnosis not present

## 2023-02-27 DIAGNOSIS — D631 Anemia in chronic kidney disease: Secondary | ICD-10-CM | POA: Diagnosis not present

## 2023-02-27 DIAGNOSIS — N2581 Secondary hyperparathyroidism of renal origin: Secondary | ICD-10-CM | POA: Diagnosis not present

## 2023-03-01 DIAGNOSIS — N2581 Secondary hyperparathyroidism of renal origin: Secondary | ICD-10-CM | POA: Diagnosis not present

## 2023-03-01 DIAGNOSIS — Z992 Dependence on renal dialysis: Secondary | ICD-10-CM | POA: Diagnosis not present

## 2023-03-01 DIAGNOSIS — D631 Anemia in chronic kidney disease: Secondary | ICD-10-CM | POA: Diagnosis not present

## 2023-03-01 DIAGNOSIS — N186 End stage renal disease: Secondary | ICD-10-CM | POA: Diagnosis not present

## 2023-03-02 DIAGNOSIS — Z992 Dependence on renal dialysis: Secondary | ICD-10-CM | POA: Diagnosis not present

## 2023-03-02 DIAGNOSIS — N186 End stage renal disease: Secondary | ICD-10-CM | POA: Diagnosis not present

## 2023-03-04 DIAGNOSIS — D631 Anemia in chronic kidney disease: Secondary | ICD-10-CM | POA: Diagnosis not present

## 2023-03-04 DIAGNOSIS — Z992 Dependence on renal dialysis: Secondary | ICD-10-CM | POA: Diagnosis not present

## 2023-03-04 DIAGNOSIS — N186 End stage renal disease: Secondary | ICD-10-CM | POA: Diagnosis not present

## 2023-03-04 DIAGNOSIS — N2581 Secondary hyperparathyroidism of renal origin: Secondary | ICD-10-CM | POA: Diagnosis not present

## 2023-03-05 DIAGNOSIS — I69354 Hemiplegia and hemiparesis following cerebral infarction affecting left non-dominant side: Secondary | ICD-10-CM | POA: Diagnosis not present

## 2023-03-05 DIAGNOSIS — Z992 Dependence on renal dialysis: Secondary | ICD-10-CM | POA: Diagnosis not present

## 2023-03-05 DIAGNOSIS — R58 Hemorrhage, not elsewhere classified: Secondary | ICD-10-CM | POA: Diagnosis not present

## 2023-03-05 DIAGNOSIS — N186 End stage renal disease: Secondary | ICD-10-CM | POA: Diagnosis not present

## 2023-03-05 DIAGNOSIS — T8243XA Leakage of vascular dialysis catheter, initial encounter: Secondary | ICD-10-CM | POA: Diagnosis not present

## 2023-03-05 DIAGNOSIS — Z794 Long term (current) use of insulin: Secondary | ICD-10-CM | POA: Diagnosis not present

## 2023-03-05 DIAGNOSIS — E1122 Type 2 diabetes mellitus with diabetic chronic kidney disease: Secondary | ICD-10-CM | POA: Diagnosis not present

## 2023-03-05 DIAGNOSIS — I120 Hypertensive chronic kidney disease with stage 5 chronic kidney disease or end stage renal disease: Secondary | ICD-10-CM | POA: Diagnosis not present

## 2023-03-05 DIAGNOSIS — E1142 Type 2 diabetes mellitus with diabetic polyneuropathy: Secondary | ICD-10-CM | POA: Diagnosis not present

## 2023-03-05 DIAGNOSIS — E119 Type 2 diabetes mellitus without complications: Secondary | ICD-10-CM | POA: Diagnosis not present

## 2023-03-05 DIAGNOSIS — S41112A Laceration without foreign body of left upper arm, initial encounter: Secondary | ICD-10-CM | POA: Diagnosis not present

## 2023-03-06 DIAGNOSIS — Z743 Need for continuous supervision: Secondary | ICD-10-CM | POA: Diagnosis not present

## 2023-03-06 DIAGNOSIS — M6281 Muscle weakness (generalized): Secondary | ICD-10-CM | POA: Diagnosis not present

## 2023-03-06 DIAGNOSIS — N189 Chronic kidney disease, unspecified: Secondary | ICD-10-CM | POA: Diagnosis not present

## 2023-03-07 DIAGNOSIS — N186 End stage renal disease: Secondary | ICD-10-CM | POA: Diagnosis not present

## 2023-03-07 DIAGNOSIS — N2581 Secondary hyperparathyroidism of renal origin: Secondary | ICD-10-CM | POA: Diagnosis not present

## 2023-03-07 DIAGNOSIS — D631 Anemia in chronic kidney disease: Secondary | ICD-10-CM | POA: Diagnosis not present

## 2023-03-07 DIAGNOSIS — Z992 Dependence on renal dialysis: Secondary | ICD-10-CM | POA: Diagnosis not present

## 2023-03-08 ENCOUNTER — Encounter (HOSPITAL_COMMUNITY): Payer: Self-pay

## 2023-03-08 ENCOUNTER — Emergency Department (HOSPITAL_COMMUNITY): Payer: Medicare Other

## 2023-03-08 ENCOUNTER — Other Ambulatory Visit: Payer: Self-pay

## 2023-03-08 ENCOUNTER — Observation Stay (HOSPITAL_COMMUNITY)
Admission: EM | Admit: 2023-03-08 | Discharge: 2023-03-09 | Disposition: A | Discharge status: Home / Self Care | Payer: Medicare Other | Attending: Vascular Surgery | Admitting: Vascular Surgery

## 2023-03-08 ENCOUNTER — Encounter (HOSPITAL_COMMUNITY): Admission: EM | Disposition: A | Discharge status: Home / Self Care | Payer: Self-pay | Source: Home / Self Care

## 2023-03-08 ENCOUNTER — Inpatient Hospital Stay (HOSPITAL_COMMUNITY): Payer: Medicare Other

## 2023-03-08 DIAGNOSIS — T829XXA Unspecified complication of cardiac and vascular prosthetic device, implant and graft, initial encounter: Secondary | ICD-10-CM | POA: Diagnosis not present

## 2023-03-08 DIAGNOSIS — R5381 Other malaise: Secondary | ICD-10-CM | POA: Diagnosis not present

## 2023-03-08 DIAGNOSIS — Y828 Other medical devices associated with adverse incidents: Secondary | ICD-10-CM | POA: Insufficient documentation

## 2023-03-08 DIAGNOSIS — T82838A Hemorrhage of vascular prosthetic devices, implants and grafts, initial encounter: Secondary | ICD-10-CM

## 2023-03-08 DIAGNOSIS — E1122 Type 2 diabetes mellitus with diabetic chronic kidney disease: Secondary | ICD-10-CM | POA: Insufficient documentation

## 2023-03-08 DIAGNOSIS — R6889 Other general symptoms and signs: Secondary | ICD-10-CM | POA: Diagnosis not present

## 2023-03-08 DIAGNOSIS — R58 Hemorrhage, not elsewhere classified: Secondary | ICD-10-CM | POA: Diagnosis not present

## 2023-03-08 DIAGNOSIS — Z992 Dependence on renal dialysis: Secondary | ICD-10-CM | POA: Insufficient documentation

## 2023-03-08 DIAGNOSIS — T82590A Other mechanical complication of surgically created arteriovenous fistula, initial encounter: Secondary | ICD-10-CM | POA: Diagnosis not present

## 2023-03-08 DIAGNOSIS — Z794 Long term (current) use of insulin: Secondary | ICD-10-CM | POA: Diagnosis not present

## 2023-03-08 DIAGNOSIS — N186 End stage renal disease: Principal | ICD-10-CM

## 2023-03-08 DIAGNOSIS — N189 Chronic kidney disease, unspecified: Secondary | ICD-10-CM | POA: Diagnosis not present

## 2023-03-08 DIAGNOSIS — Z743 Need for continuous supervision: Secondary | ICD-10-CM | POA: Diagnosis not present

## 2023-03-08 DIAGNOSIS — Z79899 Other long term (current) drug therapy: Secondary | ICD-10-CM | POA: Diagnosis not present

## 2023-03-08 DIAGNOSIS — I770 Arteriovenous fistula, acquired: Secondary | ICD-10-CM | POA: Diagnosis not present

## 2023-03-08 DIAGNOSIS — Z7902 Long term (current) use of antithrombotics/antiplatelets: Secondary | ICD-10-CM | POA: Diagnosis not present

## 2023-03-08 DIAGNOSIS — I120 Hypertensive chronic kidney disease with stage 5 chronic kidney disease or end stage renal disease: Secondary | ICD-10-CM | POA: Diagnosis not present

## 2023-03-08 DIAGNOSIS — M6281 Muscle weakness (generalized): Secondary | ICD-10-CM | POA: Diagnosis not present

## 2023-03-08 DIAGNOSIS — I129 Hypertensive chronic kidney disease with stage 1 through stage 4 chronic kidney disease, or unspecified chronic kidney disease: Secondary | ICD-10-CM | POA: Diagnosis not present

## 2023-03-08 DIAGNOSIS — Z9889 Other specified postprocedural states: Secondary | ICD-10-CM | POA: Diagnosis not present

## 2023-03-08 HISTORY — PX: INSERTION OF DIALYSIS CATHETER: SHX1324

## 2023-03-08 HISTORY — PX: LIGATION OF ARTERIOVENOUS  FISTULA: SHX5948

## 2023-03-08 LAB — POCT I-STAT, CHEM 8
BUN: 20 mg/dL (ref 8–23)
Calcium, Ion: 1.2 mmol/L (ref 1.15–1.40)
Chloride: 95 mmol/L — ABNORMAL LOW (ref 98–111)
Creatinine, Ser: 6.3 mg/dL — ABNORMAL HIGH (ref 0.61–1.24)
Glucose, Bld: 130 mg/dL — ABNORMAL HIGH (ref 70–99)
HCT: 25 % — ABNORMAL LOW (ref 39.0–52.0)
Hemoglobin: 8.5 g/dL — ABNORMAL LOW (ref 13.0–17.0)
Potassium: 3.5 mmol/L (ref 3.5–5.1)
Sodium: 137 mmol/L (ref 135–145)
TCO2: 33 mmol/L — ABNORMAL HIGH (ref 22–32)

## 2023-03-08 LAB — PREPARE RBC (CROSSMATCH)

## 2023-03-08 LAB — GLUCOSE, CAPILLARY
Glucose-Capillary: 118 mg/dL — ABNORMAL HIGH (ref 70–99)
Glucose-Capillary: 157 mg/dL — ABNORMAL HIGH (ref 70–99)

## 2023-03-08 LAB — ABO/RH: ABO/RH(D): O POS

## 2023-03-08 SURGERY — LIGATION OF ARTERIOVENOUS  FISTULA
Anesthesia: General | Site: Groin | Laterality: Right

## 2023-03-08 MED ORDER — CHLORHEXIDINE GLUCONATE 0.12 % MT SOLN
15.0000 mL | Freq: Once | OROMUCOSAL | Status: AC
  Administered 2023-03-08: 15 mL via OROMUCOSAL

## 2023-03-08 MED ORDER — SODIUM CHLORIDE 0.9 % IV SOLN: 10.0000 mL/h | Freq: Once | INTRAVENOUS | Status: DC

## 2023-03-08 MED ORDER — VASOPRESSIN 20 UNIT/ML IV SOLN
INTRAVENOUS | Status: AC
  Filled 2023-03-08: qty 1

## 2023-03-08 MED ORDER — HEPARIN SODIUM (PORCINE) 1000 UNIT/ML IJ SOLN
INTRAMUSCULAR | Status: DC | PRN
  Administered 2023-03-08: 1000 [IU] via INTRAVENOUS

## 2023-03-08 MED ORDER — PROPOFOL 10 MG/ML IV BOLUS
INTRAVENOUS | Status: AC
  Filled 2023-03-08: qty 20

## 2023-03-08 MED ORDER — CHLORHEXIDINE GLUCONATE 0.12 % MT SOLN: 15.0000 mL | Freq: Once | OROMUCOSAL | Status: AC

## 2023-03-08 MED ORDER — PROPOFOL 1000 MG/100ML IV EMUL
INTRAVENOUS | Status: AC
  Filled 2023-03-08: qty 200

## 2023-03-08 MED ORDER — ORAL CARE MOUTH RINSE: 15.0000 mL | Freq: Once | OROMUCOSAL | Status: AC

## 2023-03-08 MED ORDER — LIDOCAINE 2% (20 MG/ML) 5 ML SYRINGE
INTRAMUSCULAR | Status: DC | PRN
  Administered 2023-03-08: 80 mg via INTRAVENOUS

## 2023-03-08 MED ORDER — AMLODIPINE BESYLATE 5 MG PO TABS
5.0000 mg | ORAL_TABLET | Freq: Every day | ORAL | Status: DC
Start: 2023-03-09 — End: 2023-03-09
  Administered 2023-03-09: 5 mg via ORAL
  Filled 2023-03-08 (×2): qty 1

## 2023-03-08 MED ORDER — CHLORHEXIDINE GLUCONATE 0.12 % MT SOLN
OROMUCOSAL | Status: AC
  Administered 2023-03-08: 15 mL via OROMUCOSAL
  Filled 2023-03-08: qty 15

## 2023-03-08 MED ORDER — ONDANSETRON HCL 4 MG/2ML IJ SOLN
INTRAMUSCULAR | Status: DC | PRN
  Administered 2023-03-08: 4 mg via INTRAVENOUS

## 2023-03-08 MED ORDER — PRAVASTATIN SODIUM 10 MG PO TABS: 20.0000 mg | ORAL_TABLET | Freq: Every day | ORAL | Status: DC

## 2023-03-08 MED ORDER — CINACALCET HCL 30 MG PO TABS
120.0000 mg | ORAL_TABLET | Freq: Every evening | ORAL | Status: DC
  Administered 2023-03-08: 120 mg via ORAL
  Filled 2023-03-08: qty 4

## 2023-03-08 MED ORDER — SUGAMMADEX SODIUM 200 MG/2ML IV SOLN
INTRAVENOUS | Status: DC | PRN
  Administered 2023-03-08: 200 mg via INTRAVENOUS

## 2023-03-08 MED ORDER — ALBUMIN HUMAN 5 % IV SOLN: INTRAVENOUS | Status: DC | PRN

## 2023-03-08 MED ORDER — ADULT MULTIVITAMIN W/MINERALS CH
1.0000 | ORAL_TABLET | Freq: Every evening | ORAL | Status: DC
  Administered 2023-03-08: 1 via ORAL
  Filled 2023-03-08: qty 1

## 2023-03-08 MED ORDER — SODIUM CHLORIDE 0.9% FLUSH: 10.0000 mL | Freq: Two times a day (BID) | INTRAVENOUS | Status: DC

## 2023-03-08 MED ORDER — PROPOFOL 10 MG/ML IV BOLUS
INTRAVENOUS | Status: DC | PRN
  Administered 2023-03-08: 80 mg via INTRAVENOUS

## 2023-03-08 MED ORDER — HEPARIN SODIUM (PORCINE) 5000 UNIT/ML IJ SOLN
5000.0000 [IU] | Freq: Three times a day (TID) | INTRAMUSCULAR | Status: DC
  Administered 2023-03-09: 5000 [IU] via SUBCUTANEOUS
  Filled 2023-03-08 (×2): qty 1

## 2023-03-08 MED ORDER — LIDOCAINE-EPINEPHRINE (PF) 1 %-1:200000 IJ SOLN
INTRAMUSCULAR | Status: AC
  Filled 2023-03-08: qty 30

## 2023-03-08 MED ORDER — CEFAZOLIN SODIUM-DEXTROSE 2-3 GM-%(50ML) IV SOLR
INTRAVENOUS | Status: DC | PRN
  Administered 2023-03-08: 2 g via INTRAVENOUS

## 2023-03-08 MED ORDER — PHENYLEPHRINE HCL-NACL 20-0.9 MG/250ML-% IV SOLN
INTRAVENOUS | Status: AC
  Filled 2023-03-08: qty 500

## 2023-03-08 MED ORDER — 0.9 % SODIUM CHLORIDE (POUR BTL) OPTIME
TOPICAL | Status: DC | PRN
  Administered 2023-03-08: 1000 mL

## 2023-03-08 MED ORDER — SODIUM CHLORIDE 0.9 % IV SOLN: INTRAVENOUS | Status: DC

## 2023-03-08 MED ORDER — PHENYLEPHRINE HCL-NACL 20-0.9 MG/250ML-% IV SOLN
INTRAVENOUS | Status: DC | PRN
  Administered 2023-03-08: 30 ug/min via INTRAVENOUS

## 2023-03-08 MED ORDER — ONDANSETRON HCL 4 MG/2ML IJ SOLN: 4.0000 mg | Freq: Once | INTRAMUSCULAR | Status: DC | PRN

## 2023-03-08 MED ORDER — FENTANYL CITRATE (PF) 250 MCG/5ML IJ SOLN
INTRAMUSCULAR | Status: AC
  Filled 2023-03-08: qty 5

## 2023-03-08 MED ORDER — DEXAMETHASONE SODIUM PHOSPHATE 10 MG/ML IJ SOLN
INTRAMUSCULAR | Status: DC | PRN
  Administered 2023-03-08: 4 mg via INTRAVENOUS

## 2023-03-08 MED ORDER — HEPARIN SODIUM (PORCINE) 1000 UNIT/ML IJ SOLN
INTRAMUSCULAR | Status: AC
  Filled 2023-03-08: qty 10

## 2023-03-08 MED ORDER — SUCROFERRIC OXYHYDROXIDE 500 MG PO CHEW: 500.0000 mg | CHEWABLE_TABLET | Freq: Every day | ORAL | Status: DC

## 2023-03-08 MED ORDER — HEPARIN SODIUM (PORCINE) 1000 UNIT/ML IJ SOLN
INTRAMUSCULAR | Status: DC | PRN
  Administered 2023-03-08: 7000 [IU] via INTRAVENOUS

## 2023-03-08 MED ORDER — HEPARIN 6000 UNIT IRRIGATION SOLUTION
Status: AC
  Filled 2023-03-08: qty 500

## 2023-03-08 MED ORDER — EPHEDRINE SULFATE-NACL 50-0.9 MG/10ML-% IV SOSY
PREFILLED_SYRINGE | INTRAVENOUS | Status: DC | PRN
  Administered 2023-03-08 (×2): 10 mg via INTRAVENOUS

## 2023-03-08 MED ORDER — BENAZEPRIL HCL 20 MG PO TABS
20.0000 mg | ORAL_TABLET | Freq: Every day | ORAL | Status: DC
  Administered 2023-03-09: 20 mg via ORAL
  Filled 2023-03-08: qty 1

## 2023-03-08 MED ORDER — FENTANYL CITRATE (PF) 100 MCG/2ML IJ SOLN: 25.0000 ug | INTRAMUSCULAR | Status: DC | PRN

## 2023-03-08 MED ORDER — LIDOCAINE HCL (PF) 1 % IJ SOLN
INTRAMUSCULAR | Status: AC
  Filled 2023-03-08: qty 30

## 2023-03-08 MED ORDER — HEMOSTATIC AGENTS (NO CHARGE) OPTIME
TOPICAL | Status: DC | PRN
  Administered 2023-03-08 (×2): 1 via TOPICAL

## 2023-03-08 MED ORDER — CLOPIDOGREL BISULFATE 75 MG PO TABS
75.0000 mg | ORAL_TABLET | Freq: Every day | ORAL | Status: DC
  Administered 2023-03-09: 75 mg via ORAL
  Filled 2023-03-08 (×2): qty 1

## 2023-03-08 MED ORDER — ROCURONIUM BROMIDE 10 MG/ML (PF) SYRINGE
PREFILLED_SYRINGE | INTRAVENOUS | Status: DC | PRN
  Administered 2023-03-08 (×2): 40 mg via INTRAVENOUS
  Administered 2023-03-08: 20 mg via INTRAVENOUS

## 2023-03-08 MED ORDER — HEPARIN 6000 UNIT IRRIGATION SOLUTION
Status: DC | PRN
  Administered 2023-03-08: 1

## 2023-03-08 SURGICAL SUPPLY — 57 items
BAG COUNTER SPONGE SURGICOUNT (BAG) ×2 IMPLANT
BIOPATCH RED 1 DISK 7.0 (GAUZE/BANDAGES/DRESSINGS) ×2 IMPLANT
BNDG ELASTIC 4INX 5YD STR LF (GAUZE/BANDAGES/DRESSINGS) IMPLANT
BNDG ELASTIC 6X10 VLCR STRL LF (GAUZE/BANDAGES/DRESSINGS) IMPLANT
BNDG ESMARK 4X9 LF (GAUZE/BANDAGES/DRESSINGS) IMPLANT
CANISTER SUCT 3000ML PPV (MISCELLANEOUS) ×2 IMPLANT
CATH PALINDROME-P 23 W/VT (CATHETERS) IMPLANT
CLIP TI MEDIUM 6 (CLIP) ×2 IMPLANT
CLIP TI WIDE RED SMALL 6 (CLIP) ×2 IMPLANT
COVER PROBE W GEL 5X96 (DRAPES) ×2 IMPLANT
COVER SURGICAL LIGHT HANDLE (MISCELLANEOUS) ×2 IMPLANT
CUFF TOURN SGL QUICK 18X4 (TOURNIQUET CUFF) IMPLANT
DERMABOND ADVANCED .7 DNX12 (GAUZE/BANDAGES/DRESSINGS) ×2 IMPLANT
DRAPE C-ARM 42X72 X-RAY (DRAPES) ×2 IMPLANT
DRAPE CHEST BREAST 15X10 FENES (DRAPES) ×2 IMPLANT
DRAPE SURG ORHT 6 SPLT 77X108 (DRAPES) IMPLANT
ELECT REM PT RETURN 9FT ADLT (ELECTROSURGICAL) ×2
ELECTRODE REM PT RTRN 9FT ADLT (ELECTROSURGICAL) ×2 IMPLANT
GAUZE 4X4 16PLY ~~LOC~~+RFID DBL (SPONGE) IMPLANT
GAUZE SPONGE 4X4 12PLY STRL (GAUZE/BANDAGES/DRESSINGS) IMPLANT
GLIDEWIRE ADV .035X180CM (WIRE) IMPLANT
GLOVE BIOGEL PI IND STRL 7.0 (GLOVE) ×2 IMPLANT
GOWN STRL REUS W/ TWL LRG LVL3 (GOWN DISPOSABLE) ×4 IMPLANT
GOWN STRL REUS W/ TWL XL LVL3 (GOWN DISPOSABLE) ×2 IMPLANT
HEMOSTAT SNOW SURGICEL 2X4 (HEMOSTASIS) IMPLANT
KIT BASIN OR (CUSTOM PROCEDURE TRAY) ×2 IMPLANT
KIT TURNOVER KIT B (KITS) ×2 IMPLANT
NDL 18GX1X1/2 (RX/OR ONLY) (NEEDLE) ×2 IMPLANT
NDL HYPO 25GX1X1/2 BEV (NEEDLE) ×2 IMPLANT
NEEDLE 18GX1X1/2 (RX/OR ONLY) (NEEDLE) ×2 IMPLANT
NEEDLE HYPO 25GX1X1/2 BEV (NEEDLE) ×2 IMPLANT
NS IRRIG 1000ML POUR BTL (IV SOLUTION) ×2 IMPLANT
PACK CV ACCESS (CUSTOM PROCEDURE TRAY) ×2 IMPLANT
PACK SRG BSC III STRL LF ECLPS (CUSTOM PROCEDURE TRAY) ×2 IMPLANT
PAD ARMBOARD 7.5X6 YLW CONV (MISCELLANEOUS) ×4 IMPLANT
SET MICROPUNCTURE 5F STIFF (MISCELLANEOUS) IMPLANT
SOAP 2 % CHG 4 OZ (WOUND CARE) ×2 IMPLANT
SPONGE T-LAP 18X18 ~~LOC~~+RFID (SPONGE) IMPLANT
SUT ETHILON 3 0 PS 1 (SUTURE) ×2 IMPLANT
SUT MNCRL AB 4-0 PS2 18 (SUTURE) ×2 IMPLANT
SUT PROLENE 3 0 SH1 36 (SUTURE) IMPLANT
SUT PROLENE 5 0 C 1 24 (SUTURE) IMPLANT
SUT SILK 0 TIES 10X30 (SUTURE) ×2 IMPLANT
SUT VIC AB 2-0 CT1 TAPERPNT 27 (SUTURE) IMPLANT
SUT VIC AB 3-0 SH 27X BRD (SUTURE) ×2 IMPLANT
SYR 10ML LL (SYRINGE) ×2 IMPLANT
SYR 20ML LL LF (SYRINGE) ×4 IMPLANT
SYR 5ML LL (SYRINGE) ×2 IMPLANT
SYR CONTROL 10ML LL (SYRINGE) ×2 IMPLANT
TAPE CLOTH SURG 4X10 WHT LF (GAUZE/BANDAGES/DRESSINGS) IMPLANT
TAPE STRIPS DRAPE STRL (GAUZE/BANDAGES/DRESSINGS) IMPLANT
TOWEL GREEN STERILE (TOWEL DISPOSABLE) ×2 IMPLANT
TOWEL GREEN STERILE FF (TOWEL DISPOSABLE) ×4 IMPLANT
UNDERPAD 30X36 HEAVY ABSORB (UNDERPADS AND DIAPERS) ×2 IMPLANT
WATER STERILE IRR 1000ML POUR (IV SOLUTION) ×2 IMPLANT
WIRE BENTSON .035X145CM (WIRE) IMPLANT
WIRE TORQFLEX AUST .018X40CM (WIRE) IMPLANT

## 2023-03-08 NOTE — Anesthesia Procedure Notes (Signed)
Procedure Name: Intubation Date/Time: 03/08/2023 6:57 PM  Performed by: Sandie Ano, CRNAPre-anesthesia Checklist: Patient identified, Emergency Drugs available, Suction available and Patient being monitored Patient Re-evaluated:Patient Re-evaluated prior to induction Oxygen Delivery Method: Circle System Utilized Preoxygenation: Pre-oxygenation with 100% oxygen Induction Type: IV induction Ventilation: Mask ventilation without difficulty Laryngoscope Size: Mac and 3 Grade View: Grade II Tube type: Oral Tube size: 7.5 mm Number of attempts: 1 Airway Equipment and Method: Stylet and Oral airway Placement Confirmation: ETT inserted through vocal cords under direct vision, positive ETCO2 and breath sounds checked- equal and bilateral Secured at: 22 cm Tube secured with: Tape Dental Injury: Teeth and Oropharynx as per pre-operative assessment

## 2023-03-08 NOTE — ED Notes (Signed)
Family brought back to room.

## 2023-03-08 NOTE — Anesthesia Preprocedure Evaluation (Signed)
Anesthesia Evaluation  Patient identified by MRN, date of birth, ID band Patient awake    Reviewed: Allergy & Precautions, NPO status , Patient's Chart, lab work & pertinent test results  Airway Mallampati: II  TM Distance: >3 FB Neck ROM: Full    Dental  (+) Dental Advisory Given, Poor Dentition, Chipped, Missing,    Pulmonary neg pulmonary ROS   Pulmonary exam normal breath sounds clear to auscultation       Cardiovascular hypertension, Pt. 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  Reproductive/Obstetrics                              Anesthesia Physical Anesthesia Plan  ASA: 3 and emergent  Anesthesia Plan: General   Post-op Pain Management:    Induction: Intravenous  PONV Risk Score and Plan: 2 and Dexamethasone and Ondansetron  Airway Management Planned: Oral ETT  Additional Equipment:   Intra-op Plan:   Post-operative Plan: Extubation in OR  Informed Consent: I have reviewed the patients History and Physical, chart, labs and discussed the procedure including the risks, benefits and alternatives for the proposed anesthesia with the patient or authorized representative who has indicated his/her understanding and acceptance.     Dental advisory given  Plan Discussed with: CRNA  Anesthesia Plan Comments:          Anesthesia Quick Evaluation

## 2023-03-08 NOTE — H&P (Addendum)
Hospital Consult   MRN #:  [id number]  History of Present Illness: This is a 72 y.o. male with ESRD on HD who is presenting as a ED to ED transfer from [facility] for a bleeding left upper extremity fistula.  He is here with his brother and sister report that his left upper extremity fistula bled quite a bit on Tuesday night and was in the ED until Wednesday afternoon.  He then had a rebleed this morning on the way to dialysis and the dialysis center sent him to the emergency department in [place].  A pressure dressing and C-clamp were applied and he was transferred to [facility] health emergency department. Of note he did have a right upper extremity brachiocephalic fistula creation done about 1 week ago with an interventional nephrologist in [place] but this has obviously not matured at this time and he does not have a TDC currently.  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  Patient does not take blood thinners  Labs from [place] notable for hemoglobin of 8.1  Past Medical History:  Diagnosis Date   Anemia    Chronic kidney disease    End Stage Renal Disease   Diabetes mellitus without complication (HCC)    Hypertension     Past Surgical History:  Procedure Laterality Date   AMPUTATION Right 08/26/2014   Procedure: AMPUTATION DIGIT 4TH TOE RIGHT FOOT;  Surgeon: Laurell Josephs, DPM;  Location: [facility];  Service: Podiatry;  Laterality: Right;   CYSTECTOMY     back of neck.    No Known Allergies  Prior to Admission medications   Medication Sig Start Date End Date Taking? Authorizing Provider  amLODipine (NORVASC) 5 MG tablet Take 5 mg by mouth daily.    [provider]  clopidogrel (PLAVIX) 75 MG tablet Take 75 mg by mouth daily.    [provider]  oxyCODONE (OXY IR/ROXICODONE) 5 MG immediate release tablet Take 5 mg by mouth every 4  (four) hours as needed for severe pain.    [provider]    Social History   Socioeconomic History   Marital status: Widowed    Spouse name: Not on file   Number of children: Not on file   Years of education: Not on file   Highest education level: Not on file  Occupational History   Not on file  Tobacco Use   Smoking status: Never   Smokeless tobacco: Not on file  Substance and Sexual Activity   Alcohol use: No   Drug use: No   Sexual activity: Never    Birth control/protection: None  Other Topics Concern   Not on file  Social History Narrative   Not on file   Social Determinants of Health   Financial Resource Strain: Not on file  Food Insecurity: Not on file  Transportation Needs: Not on file  Physical Activity: Not on file  Stress: Not on file  Social Connections: Not on file  Intimate Partner Violence: Not on file    No family history on file.  ROS: Otherwise negative unless mentioned in HPI  Physical Examination  Vitals:   03/08/23 1707  BP: (!) 127/45  Pulse: 64  Resp: 17  Temp: 98.5 F (36.9 C)  SpO2: 100%   There is no height or weight on file to calculate BMI.  General: no acute distress Cardiac: hemodynamically stable,  nontachycardic Pulm: normal work of breathing GI: non-tender, no pulsatile mass  Neuro: alert, no focal deficit Extremities: Palpable thrill in right brachiocephalic fistula.  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 male with ESRD on HD with an ulcerated left upper extremity fistula aneurysm who has had 2 bleeds in the past week.  He did have a right upper extremity brachiocephalic fistula placed in late November, I assume this was likely created due to the appearance of the left upper extremity fistula aneurysms with thin skin. Plan for left upper extremity AV fistula ligation with tunneled dialysis catheter placement.  Will plan for  left IJ TDC placement if possible given his current maturing right upper extremity AV fistula  Risks and benefits were reviewed with the patient and his siblings, they expressed understanding and were willing to proceed.  Daria Pastures MD [facility] [phone] 03/08/2023  5:36 PM

## 2023-03-08 NOTE — ED Provider Notes (Signed)
[place] EMERGENCY DEPARTMENT AT [facility] Provider Note   CSN: [id number] Arrival date & time: 03/08/23  1700     History  No chief complaint on file.   Thomas Ruiz is a 72 y.o. male.  72 year old male with past medical history of hypertension and end-stage renal disease presents the emergency department today concern for bleeding fistula.  The patient had a clamp put on this at dialysis.  He reports that he did have notably significant bleeding prior to this.  He denies any chest pain, shortness of breath, or lightheadedness.  He is brought to the ER today for further evaluation regarding this.  He was evaluated down in the ER by Dr. Hetty Blend who was called prior to the patient being sent over.        Home Medications Prior to Admission medications   Medication Sig Start Date End Date Taking? Authorizing Provider  amLODipine-benazepril (LOTREL) 5-20 MG capsule Take 2 capsules by mouth daily. 07/04/20  Yes [provider]  cinacalcet (SENSIPAR) 60 MG tablet Take 120 mg by mouth every evening.   Yes [provider]  clopidogrel (PLAVIX) 75 MG tablet Take 75 mg by mouth daily.   Yes [provider]  Multiple Vitamins-Minerals (RENAPLEX-D) TABS Take 1 tablet by mouth every evening.   Yes [provider]  oxyCODONE (OXY IR/ROXICODONE) 5 MG immediate release tablet Take 5 mg by mouth every 4 (four) hours as needed for severe pain.   Yes [provider]  pravastatin (PRAVACHOL) 20 MG tablet Take 1 tablet by mouth daily. 07/04/20  Yes [provider]  VELPHORO 500 MG chewable tablet Chew 500 mg by mouth See admin instructions. CHEW & SWALLOW 1 TABLET BY MOUTH ONCE A DAY WITH THE LARGEST MEAL OF THE DAY ONLY AND 1 TABLET WITH SNACK 12/12/22  Yes [provider]      Allergies    Patient has no known allergies.    Review of Systems   Review of Systems  Hematological:        Bleeding from fistula  All other  systems reviewed and are negative.   Physical Exam Updated Vital Signs BP (!) 104/57 (BP Location: Right Leg)   Pulse 74   Temp 97.7 F (36.5 C) (Oral)   Resp 18   Ht 6\' 4"  (1.93 m)   Wt 71.7 kg   SpO2 100%   BMI 19.23 kg/m  Physical Exam Vitals and nursing note reviewed.  Constitutional:      Appearance: Normal appearance.  Cardiovascular:     Rate and Rhythm: Normal rate and regular rhythm.     Pulses: Normal pulses.     Comments: The patient initially has a clamp over the antecubital fossa.  This was taken down by Dr. Hetty Blend and there does appear to be some ulceration and oozing of blood from the site.  There is a suture noted that was placed here few days ago Neurological:     Mental Status: He is alert.     ED Results / Procedures / Treatments   Labs (all labs ordered are listed, but only abnormal results are displayed) Labs Reviewed  GLUCOSE, CAPILLARY - Abnormal; Notable for the following components:      Result Value   Glucose-Capillary 118 (*)    All other components within normal limits  GLUCOSE, CAPILLARY - Abnormal; Notable for the following components:   Glucose-Capillary 157 (*)    All other components within normal limits  POCT  I-STAT, CHEM 8 - Abnormal; Notable for the following components:   Chloride 95 (*)    Creatinine, Ser 6.30 (*)    Glucose, Bld 130 (*)    TCO2 33 (*)    Hemoglobin 8.5 (*)    HCT 25.0 (*)    All other components within normal limits  BASIC METABOLIC PANEL  CBC  PROTIME-INR  TYPE AND SCREEN  ABO/RH  PREPARE RBC (CROSSMATCH)    EKG None  Radiology DG Chest Port 1V same Day  Result Date: 03/08/2023 CLINICAL DATA:  Postoperative state EXAM: PORTABLE CHEST 1 VIEW COMPARISON:  08/01/2009 FINDINGS: No acute airspace disease or effusion. Normal cardiac size with aortic atherosclerosis. Vascular stent in the left axillary region. Ascending catheter with tip at the superior cavoatrial junction. IMPRESSION: No active disease.  Ascending catheter with tip at the superior cavoatrial junction. Electronically Signed   By: Jasmine Pang M.D.   On: 03/08/2023 22:43   DG C-Arm 1-60 Min-No Report  Result Date: 03/08/2023 Fluoroscopy was utilized by the requesting physician.  No radiographic interpretation.   DG C-Arm 1-60 Min-No Report  Result Date: 03/08/2023 Fluoroscopy was utilized by the requesting physician.  No radiographic interpretation.    Procedures Procedures    Medications Ordered in ED Medications  heparin injection 5,000 Units (5,000 Units Subcutaneous Not Given 03/08/23 2222)  clopidogrel (PLAVIX) tablet 75 mg (has no administration in time range)  multivitamin with minerals tablet 1 tablet (has no administration in time range)  pravastatin (PRAVACHOL) tablet 20 mg (has no administration in time range)  sucroferric oxyhydroxide (VELPHORO) chewable tablet 500 mg (has no administration in time range)  cinacalcet (SENSIPAR) tablet 120 mg (has no administration in time range)  amLODipine (NORVASC) tablet 5 mg (has no administration in time range)    And  benazepril (LOTENSIN) tablet 20 mg (has no administration in time range)  0.9 %  sodium chloride infusion (10 mL/hr Intravenous Not Given 03/08/23 2313)  chlorhexidine (PERIDEX) 0.12 % solution 15 mL (15 mLs Mouth/Throat Given 03/08/23 1828)    Or  Oral care mouth rinse ( Mouth Rinse See Alternative 03/08/23 1828)  chlorhexidine (PERIDEX) 0.12 % solution 15 mL (15 mLs Mouth/Throat Given 03/08/23 1835)    Or  Oral care mouth rinse ( Mouth Rinse See Alternative 03/08/23 1835)    ED Course/ Medical Decision Making/ A&P                                 Medical Decision Making 72 year old male with past medical history of hypertension end-stage renal disease presenting to the emergency department today with bleeding from his AV fistula.  Dr. Hetty Blend will take the patient to the OR for definitive management.  Dr. Hetty Blend did asked that I reach out to the  hospitalist service.  Initially I spoke with one of the hospitalist from [facility].  The patient is unassigned so we did try to get in touch with some 1 regarding this.  The patient was in the OR and would not receive a call back.  His second call was placed.  The patient's labs are largely unremarkable.  Amount and/or Complexity of Data Reviewed Labs: ordered.  Risk Decision regarding hospitalization.           Final Clinical Impression(s) / ED Diagnoses Final diagnoses:  Malfunction of arteriovenous dialysis fistula, initial encounter [facility])    Rx / DC Orders ED Discharge Orders  None         Durwin Glaze, MD 03/08/23 2320

## 2023-03-08 NOTE — Op Note (Signed)
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 male with ESRD on HD with a left upper extremity AV fistula.  He has had 2 bleeds in the last week and there is an ulcerated aneurysm with exposed vein wall.  He also recently underwent right brachiocephalic AV fistula creation in [place] which has not matured yet.  Risks and benefits of left arm AV fistula ligation with tunneled dialysis catheter placement were reviewed with the patient and his siblings and they elected to proceed.  DESCRIPTION: The patient was brought to the operating room positioned supine on the operating room table.  General anesthesia was induced, preoperative antibiotics were administered and timeout was performed.  The left arm was prepped and draped in usual sterile fashion there was significant bleeding from the ulcerated fistula.  Manual pressure was held sterilely and a tourniquet was brought onto the field.  The patient was systemically heparinized and an Esmarch was used to exsanguinate the left arm and the tourniquet was inflated to 250.  There was excellent hemostasis and I then began by making an elliptical incision around the ulcerated aneurysm.  This was carried deeper with sharp dissection and electrocautery.  An  aspect of the fistula proximal to the ulcerated aneurysm was dissected circumferentially and proximal control was obtained with a vascular clamp.  I then obtained circumferential control of a segment of the fistula distal to the ulcerated aneurysm and a 0 silk tie was used to ligate the distal aspect.  The fistula was then transected and the aneurysm excised.  The proximal stump was oversewn with a 3-0 Prolene in a Blalock fashion.  The tourniquet was let down and there appeared to be adequate hemostasis.  The wound was copiously irrigated and inspected for hemostasis.  There did appear to be a small trickle from the distal aspect of the ligated fistula.  A vascular clamp was applied and the distal end of the fistula was ligated with a 3-0 Prolene in a Blalock fashion.  Hemostatic agents were placed into the wound and the wound was closed in layers with 2 and 3-0 Vicryl followed by 3-0 nylon.  There was a multiphasic signal in both the radial and ulnar.  Using ultrasound guidance the left internal jugular vein was accessed with micropuncture technique.  Through the micropuncture sheath, the guidewire was advanced into the superior vena cava.  A small incision was made around the skin access point.  The access point was serially dilated under direct fluoroscopic guidance.  A peel-away sheath was introduced into the superior vena cava under fluoroscopic guidance, although should be noted that there was difficulty passing the sheath into the SVC.  A counterincision was made in the chest under the clavicle.  A 23 cm tunnel dialysis catheter was then tunneled under the skin, over the clavicle into the incision in the neck.  The tunneling device was removed and  the catheter fed through the peel-away sheath.  Unfortunately I was unable to get this catheter to turn down into the SVC despite multiple attempts with rewiring.  I then pulled the catheter back and transected it to replace the wire and into the central system.   The catheter was removed and I reattempted to place a 23 cm tunneled dialysis catheter with a new catheter set and new peel-away sheath.  Unfortunately I ran into the same issue and was unable to get the sheath or catheter to turn down into the SVC.  This placement was then aborted the catheter wire and sheath were removed and manual pressure held.  Both the counterincision and access site were dressed with a sterile dressing.  Using ultrasound guidance the right femoral vein was accessed with a micropuncture technique.  Through the micropuncture sheath, the Glidewire was advanced into the IVC.  A small counter incision was made inferior and laterally on the leg.  The access site was serially dilated under direct fluoroscopic guidance.  A peel-away sheath was introduced into the IVC under fluoroscopic guidance.  A 55 cm tunneled dialysis catheter was then tunneled under the skin and to the groin access site.  The catheter was then placed through the peel-away sheath and the peel-away sheath removed with adequate placement into the atriocaval junction.  Both lumens aspirated and flushed appropriately.  The catheter was heparin locked and in fashion to the skin with 3-0 nylon.  The access site was closed with a 4-0 nylon and Dermabond.  All counts were correct at the end of the case, the patient tolerated procedure well and was brought to recovery in stable condition.     COMPLICATIONS: None apparent  CONDITION: Stable  Daria Pastures MD [facility] Phone Number: [phone] 03/08/2023 6:03 PM   03/08/2023, 6:03 PM

## 2023-03-08 NOTE — Transfer of Care (Signed)
Immediate Anesthesia Transfer of Care Note  Patient: AYODEJI THI  Procedure(s) Performed: LIGATION OF ARTERIOVENOUS  FISTULA (Left) INSERTION OF DIALYSIS CATHETER (Right: Groin)  Patient Location: PACU  Anesthesia Type:General  Level of Consciousness: awake, alert , and oriented  Airway & Oxygen Therapy: Patient Spontanous Breathing  Post-op Assessment: Report given to RN and Post -op Vital signs reviewed and stable  Post vital signs: Reviewed and stable  Last Vitals:  Vitals Value Taken Time  BP 131/31 03/08/23 2152  Temp    Pulse 75 03/08/23 2154  Resp 16 03/08/23 2154  SpO2 100 % 03/08/23 2154  Vitals shown include unfiled device data.  Last Pain:  Vitals:   03/08/23 1820  TempSrc:   PainSc: 0-No pain         Complications: No notable events documented.

## 2023-03-08 NOTE — Anesthesia Postprocedure Evaluation (Signed)
Anesthesia Post Note  Patient: Thomas Ruiz  Procedure(s) Performed: LIGATION OF ARTERIOVENOUS  FISTULA (Left) INSERTION OF DIALYSIS CATHETER (Right: Groin)     Patient location during evaluation: PACU Anesthesia Type: General Level of consciousness: awake and alert Pain management: pain level controlled Vital Signs Assessment: post-procedure vital signs reviewed and stable Respiratory status: spontaneous breathing, nonlabored ventilation, respiratory function stable and patient connected to nasal cannula oxygen Cardiovascular status: blood pressure returned to baseline and stable Postop Assessment: no apparent nausea or vomiting Anesthetic complications: no   No notable events documented.  Last Vitals:  Vitals:   03/08/23 2153 03/08/23 2200  BP: (!) 131/31 (!) 136/23  Pulse: 76   Resp: 16 15  Temp: 36.5 C   SpO2: 100%     Last Pain:  Vitals:   03/08/23 2153  TempSrc:   PainSc: 0-No pain                 Collene Schlichter

## 2023-03-08 NOTE — ED Triage Notes (Signed)
Pt bib [facility] from HD. Pt receives tx T/Th/Sat. Pt has 2 fistulas on right and left. The pt fistula on the right has matured yet, so the fistula on the left was used today when it ruptured. Bleeding was controlled and clamped. Pt hemoglobin 8.1 and recommended to come to [facility] to be seen by vascular.    Hx DM2 HTN   VSS

## 2023-03-08 NOTE — Progress Notes (Signed)
Patient admitted to the unit from [facility] RN at bedside. Handoff conducted,skin checked and lines are intact with dressings in place. Patient denies having pain and family notified of patient transfer to unit for admission. Patient A/Ox4 and pleasant. Patient stated he is oliguric and continent of bowel and uses wheelchair for transfers.

## 2023-03-09 ENCOUNTER — Encounter (HOSPITAL_COMMUNITY): Payer: Self-pay | Admitting: Vascular Surgery

## 2023-03-09 ENCOUNTER — Other Ambulatory Visit: Payer: Self-pay

## 2023-03-09 DIAGNOSIS — N186 End stage renal disease: Secondary | ICD-10-CM | POA: Diagnosis not present

## 2023-03-09 LAB — CBC
HCT: 24.3 % — ABNORMAL LOW (ref 39.0–52.0)
Hemoglobin: 8.2 g/dL — ABNORMAL LOW (ref 13.0–17.0)
MCH: 29.7 pg (ref 26.0–34.0)
MCHC: 33.7 g/dL (ref 30.0–36.0)
MCV: 88 fL (ref 80.0–100.0)
Platelets: 175 10*3/uL (ref 150–400)
RBC: 2.76 MIL/uL — ABNORMAL LOW (ref 4.22–5.81)
RDW: 17.1 % — ABNORMAL HIGH (ref 11.5–15.5)
WBC: 12.7 10*3/uL — ABNORMAL HIGH (ref 4.0–10.5)
nRBC: 0 % (ref 0.0–0.2)

## 2023-03-09 LAB — COMPREHENSIVE METABOLIC PANEL
ALT: 12 U/L (ref 0–44)
AST: 18 U/L (ref 15–41)
Albumin: 3 g/dL — ABNORMAL LOW (ref 3.5–5.0)
Alkaline Phosphatase: 75 U/L (ref 38–126)
Anion gap: 15 (ref 5–15)
BUN: 24 mg/dL — ABNORMAL HIGH (ref 8–23)
CO2: 28 mmol/L (ref 22–32)
Calcium: 9.6 mg/dL (ref 8.9–10.3)
Chloride: 95 mmol/L — ABNORMAL LOW (ref 98–111)
Creatinine, Ser: 6.69 mg/dL — ABNORMAL HIGH (ref 0.61–1.24)
GFR, Estimated: 8 mL/min — ABNORMAL LOW (ref >60)
Glucose, Bld: 159 mg/dL — ABNORMAL HIGH (ref 70–99)
Potassium: 3.6 mmol/L (ref 3.5–5.1)
Sodium: 138 mmol/L (ref 135–145)
Total Bilirubin: 0.7 mg/dL (ref <1.2)
Total Protein: 5.5 g/dL — ABNORMAL LOW (ref 6.5–8.1)

## 2023-03-09 LAB — HIV ANTIBODY (ROUTINE TESTING W REFLEX): HIV Screen 4th Generation wRfx: NONREACTIVE

## 2023-03-09 LAB — TYPE AND SCREEN
ABO/RH(D): O POS
Antibody Screen: NEGATIVE
Unit division: 0
Unit division: 0

## 2023-03-09 LAB — BPAM RBC
Blood Product Expiration Date: 202412282359
Blood Product Expiration Date: 202412282359
ISSUE DATE / TIME: 202412062043
ISSUE DATE / TIME: 202412062043
Unit Type and Rh: 5100
Unit Type and Rh: 5100

## 2023-03-09 MED ORDER — DOCUSATE SODIUM 100 MG PO CAPS
100.0000 mg | ORAL_CAPSULE | Freq: Two times a day (BID) | ORAL | Status: DC
Start: 2023-03-09 — End: 2023-03-09
  Administered 2023-03-09: 100 mg via ORAL
  Filled 2023-03-09: qty 1

## 2023-03-09 MED ORDER — CEFAZOLIN SODIUM-DEXTROSE 1-4 GM/50ML-% IV SOLN
1.0000 g | INTRAVENOUS | Status: DC
  Administered 2023-03-09: 1 g via INTRAVENOUS
  Filled 2023-03-09 (×2): qty 50

## 2023-03-09 MED ORDER — HYDRALAZINE HCL 20 MG/ML IJ SOLN: 5.0000 mg | INTRAMUSCULAR | Status: DC | PRN

## 2023-03-09 MED ORDER — ACETAMINOPHEN 325 MG PO TABS: 325.0000 mg | ORAL_TABLET | ORAL | Status: DC | PRN

## 2023-03-09 MED ORDER — ACETAMINOPHEN 650 MG RE SUPP: 325.0000 mg | RECTAL | Status: DC | PRN

## 2023-03-09 MED ORDER — PANTOPRAZOLE SODIUM 40 MG PO TBEC
40.0000 mg | DELAYED_RELEASE_TABLET | Freq: Every day | ORAL | Status: DC
Start: 2023-03-09 — End: 2023-03-09
  Administered 2023-03-09: 40 mg via ORAL
  Filled 2023-03-09 (×2): qty 1

## 2023-03-09 MED ORDER — OXYCODONE HCL 5 MG PO TABS: 5.0000 mg | ORAL_TABLET | Freq: Three times a day (TID) | ORAL | 0 refills | Status: AC | PRN

## 2023-03-09 MED ORDER — SODIUM CHLORIDE 0.9% FLUSH: 3.0000 mL | INTRAVENOUS | Status: DC | PRN

## 2023-03-09 MED ORDER — POTASSIUM CHLORIDE CRYS ER 20 MEQ PO TBCR
20.0000 meq | EXTENDED_RELEASE_TABLET | Freq: Once | ORAL | Status: AC
  Administered 2023-03-09: 20 meq via ORAL
  Filled 2023-03-09: qty 1

## 2023-03-09 MED ORDER — ONDANSETRON HCL 4 MG/2ML IJ SOLN
4.0000 mg | Freq: Four times a day (QID) | INTRAMUSCULAR | Status: DC | PRN
Start: 2023-03-09 — End: 2023-03-09

## 2023-03-09 MED ORDER — SODIUM CHLORIDE 0.9 % IV SOLN: 250.0000 mL | INTRAVENOUS | Status: DC | PRN

## 2023-03-09 MED ORDER — CHLORHEXIDINE GLUCONATE CLOTH 2 % EX PADS
6.0000 | MEDICATED_PAD | Freq: Every day | CUTANEOUS | Status: DC
  Administered 2023-03-09: 6 via TOPICAL

## 2023-03-09 MED ORDER — HYDROMORPHONE HCL 1 MG/ML IJ SOLN: 0.5000 mg | INTRAMUSCULAR | Status: DC | PRN

## 2023-03-09 MED ORDER — ALUM & MAG HYDROXIDE-SIMETH 200-200-20 MG/5ML PO SUSP
15.0000 mL | ORAL | Status: DC | PRN
Start: 2023-03-09 — End: 2023-03-09

## 2023-03-09 MED ORDER — SODIUM CHLORIDE 0.9% FLUSH
3.0000 mL | Freq: Two times a day (BID) | INTRAVENOUS | Status: DC
Start: 2023-03-09 — End: 2023-03-09
  Administered 2023-03-09: 3 mL via INTRAVENOUS

## 2023-03-09 MED ORDER — METOPROLOL TARTRATE 5 MG/5ML IV SOLN: 2.0000 mg | INTRAVENOUS | Status: DC | PRN

## 2023-03-09 MED ORDER — PHENOL 1.4 % MT LIQD: 1.0000 | OROMUCOSAL | Status: DC | PRN

## 2023-03-09 MED ORDER — OXYCODONE HCL 5 MG PO TABS: 5.0000 mg | ORAL_TABLET | ORAL | Status: DC | PRN

## 2023-03-09 MED ORDER — GUAIFENESIN-DM 100-10 MG/5ML PO SYRP: 15.0000 mL | ORAL_SOLUTION | ORAL | Status: DC | PRN

## 2023-03-09 MED ORDER — LABETALOL HCL 5 MG/ML IV SOLN: 10.0000 mg | INTRAVENOUS | Status: DC | PRN

## 2023-03-09 MED ORDER — SENNOSIDES-DOCUSATE SODIUM 8.6-50 MG PO TABS: 1.0000 | ORAL_TABLET | Freq: Every evening | ORAL | Status: DC | PRN

## 2023-03-09 NOTE — Care Management Obs Status (Signed)
MEDICARE OBSERVATION STATUS NOTIFICATION   Patient Details  Name: Thomas Ruiz MRN: [id number] Date of Birth: 1950-05-17   Medicare Observation Status Notification Given:  Yes    Jode Lippe G., RN 03/09/2023, 2:59 PM

## 2023-03-09 NOTE — Progress Notes (Signed)
  Daily Progress Note  S/p: Left arm AV fistula ligation and right femoral TDC placement  Subjective: No complaints this morning, comfortable in bed.  Objective: Vitals:   03/09/23 0528 03/09/23 0736  BP: (!) 147/37 (!) 135/42  Pulse: 72 72  Resp: 15 16  Temp: (!) 97.5 F (36.4 C) 97.9 F (36.6 C)  SpO2: 100% 100%    Physical Examination Awake alert, no acute distress Hemodynamically stable Left arm dressing clean dry and intact, small hematoma at the left upper extremity incision Palpable left radial, palpable thrill in right brachiocephalic fistula  ASSESSMENT/PLAN:  72 year old male who underwent left arm AV fistula ligation and right femoral TDC placement due hemorrhage from an ulcerated left arm AV fistula.  He does have a thrill in his recently created right brachiocephalic fistula.   Will consult nephrology to assess for dialysis needs this weekend.  If okay to hold off until his normal session on Monday then we will plan for discharge today with follow-up with his interventional nephrologist in [facility].   Daria Pastures MD [facility] [phone] 03/09/2023  7:56 AM

## 2023-03-09 NOTE — Care Management CC44 (Signed)
Condition Code 44 Documentation Completed  Patient Details  Name: Thomas Ruiz MRN: [id number] Date of Birth: 09-17-1950   Condition Code 44 given:    Patient signature on Condition Code 44 notice:    Documentation of 2 MD's agreement:    Code 44 added to claim:       Gabbrielle Mcnicholas G., RN 03/09/2023, 2:56 PM

## 2023-03-09 NOTE — Care Management Obs Status (Signed)
MEDICARE OBSERVATION STATUS NOTIFICATION   Patient Details  Name: Thomas Ruiz MRN: [id number] Date of Birth: 11/12/1950   Medicare Observation Status Notification Given:  Yes    Aloysius Heinle G., RN 03/09/2023, 2:56 PM

## 2023-03-10 NOTE — Discharge Summary (Signed)
Physician Discharge Summary  Patient ID: SHAHIEM MCMURRY MRN: [id number] DOB/AGE: 08-25-1950 72 y.o.  Admit date: 03/08/2023 Discharge date: 03/10/2023  Admission Diagnoses:  Discharge Diagnoses:  Principal Problem:   ESRD (end stage renal disease) on dialysis [facility]) Active Problems:   ESRD (end stage renal disease) (HCC)   Discharged Condition: stable  Hospital Course: Presented with ulcerated AVF. Proceeded to OR for ligation and TDC placement. Nephrology consulted, determined to be ok to hold off on HD until Monday at his home unit. Deemed appropriate for discharge.  Consults: nephrology  Treatments: surgery: Left arm AVF ligation and R femoral TDC placement  Discharge Exam: Blood pressure (!) 152/29, pulse 79, temperature 98.2 F (36.8 C), temperature source Oral, resp. rate 17, height 6\' 4"  (1.93 m), weight 71.7 kg, SpO2 100%. Awake alert, no acute distress Hemodynamically stable Left arm dressing clean dry and intact, small hematoma at the left upper extremity incision Palpable left radial, palpable thrill in right brachiocephalic fistula   Disposition: Discharge disposition: 01-Home or Self Care       Discharge Instructions     Discharge patient   Complete by: As directed    Discharge disposition: 01-Home or Self Care   Discharge patient date: 03/09/2023      Allergies as of 03/09/2023   No Known Allergies      Medication List     TAKE these medications    amLODipine-benazepril 5-20 MG capsule Commonly known as: LOTREL Take 2 capsules by mouth daily.   cinacalcet 60 MG tablet Commonly known as: SENSIPAR Take 120 mg by mouth every evening.   clopidogrel 75 MG tablet Commonly known as: PLAVIX Take 75 mg by mouth daily.   oxyCODONE 5 MG immediate release tablet Commonly known as: Oxy IR/ROXICODONE Take 5 mg by mouth every 4 (four) hours as needed for severe pain. What changed: Another medication with the same name was added. Make sure you  understand how and when to take each.   oxyCODONE 5 MG immediate release tablet Commonly known as: Roxicodone Take 1 tablet (5 mg total) by mouth every 8 (eight) hours as needed. What changed: You were already taking a medication with the same name, and this prescription was added. Make sure you understand how and when to take each.   pravastatin 20 MG tablet Commonly known as: PRAVACHOL Take 1 tablet by mouth daily.   RenaPlex-D Tabs Take 1 tablet by mouth every evening.   Velphoro 500 MG chewable tablet Generic drug: sucroferric oxyhydroxide Chew 500 mg by mouth See admin instructions. CHEW & SWALLOW 1 TABLET BY MOUTH ONCE A DAY WITH THE LARGEST MEAL OF THE DAY ONLY AND 1 TABLET WITH SNACK         Signed: Daria Pastures 03/10/2023, 8:20 AM

## 2023-03-11 DIAGNOSIS — T82898A Other specified complication of vascular prosthetic devices, implants and grafts, initial encounter: Secondary | ICD-10-CM | POA: Diagnosis not present

## 2023-03-11 DIAGNOSIS — N186 End stage renal disease: Secondary | ICD-10-CM | POA: Diagnosis not present

## 2023-03-11 DIAGNOSIS — T82838A Hemorrhage of vascular prosthetic devices, implants and grafts, initial encounter: Secondary | ICD-10-CM | POA: Diagnosis not present

## 2023-03-11 DIAGNOSIS — N2581 Secondary hyperparathyroidism of renal origin: Secondary | ICD-10-CM | POA: Diagnosis not present

## 2023-03-11 DIAGNOSIS — D631 Anemia in chronic kidney disease: Secondary | ICD-10-CM | POA: Diagnosis not present

## 2023-03-11 DIAGNOSIS — Z992 Dependence on renal dialysis: Secondary | ICD-10-CM | POA: Diagnosis not present

## 2023-03-12 DIAGNOSIS — N186 End stage renal disease: Secondary | ICD-10-CM | POA: Diagnosis not present

## 2023-03-12 DIAGNOSIS — I120 Hypertensive chronic kidney disease with stage 5 chronic kidney disease or end stage renal disease: Secondary | ICD-10-CM | POA: Diagnosis not present

## 2023-03-12 DIAGNOSIS — Z992 Dependence on renal dialysis: Secondary | ICD-10-CM | POA: Diagnosis not present

## 2023-03-12 DIAGNOSIS — E1122 Type 2 diabetes mellitus with diabetic chronic kidney disease: Secondary | ICD-10-CM | POA: Diagnosis not present

## 2023-03-12 DIAGNOSIS — E1142 Type 2 diabetes mellitus with diabetic polyneuropathy: Secondary | ICD-10-CM | POA: Diagnosis not present

## 2023-03-12 DIAGNOSIS — I69354 Hemiplegia and hemiparesis following cerebral infarction affecting left non-dominant side: Secondary | ICD-10-CM | POA: Diagnosis not present

## 2023-03-13 DIAGNOSIS — N2581 Secondary hyperparathyroidism of renal origin: Secondary | ICD-10-CM | POA: Diagnosis not present

## 2023-03-13 DIAGNOSIS — Z992 Dependence on renal dialysis: Secondary | ICD-10-CM | POA: Diagnosis not present

## 2023-03-13 DIAGNOSIS — N186 End stage renal disease: Secondary | ICD-10-CM | POA: Diagnosis not present

## 2023-03-13 DIAGNOSIS — D631 Anemia in chronic kidney disease: Secondary | ICD-10-CM | POA: Diagnosis not present

## 2023-03-14 DIAGNOSIS — I69354 Hemiplegia and hemiparesis following cerebral infarction affecting left non-dominant side: Secondary | ICD-10-CM | POA: Diagnosis not present

## 2023-03-14 DIAGNOSIS — E1122 Type 2 diabetes mellitus with diabetic chronic kidney disease: Secondary | ICD-10-CM | POA: Diagnosis not present

## 2023-03-14 DIAGNOSIS — I120 Hypertensive chronic kidney disease with stage 5 chronic kidney disease or end stage renal disease: Secondary | ICD-10-CM | POA: Diagnosis not present

## 2023-03-14 DIAGNOSIS — E1142 Type 2 diabetes mellitus with diabetic polyneuropathy: Secondary | ICD-10-CM | POA: Diagnosis not present

## 2023-03-14 DIAGNOSIS — N186 End stage renal disease: Secondary | ICD-10-CM | POA: Diagnosis not present

## 2023-03-14 DIAGNOSIS — Z992 Dependence on renal dialysis: Secondary | ICD-10-CM | POA: Diagnosis not present

## 2023-03-15 DIAGNOSIS — N186 End stage renal disease: Secondary | ICD-10-CM | POA: Diagnosis not present

## 2023-03-15 DIAGNOSIS — D631 Anemia in chronic kidney disease: Secondary | ICD-10-CM | POA: Diagnosis not present

## 2023-03-15 DIAGNOSIS — Z992 Dependence on renal dialysis: Secondary | ICD-10-CM | POA: Diagnosis not present

## 2023-03-15 DIAGNOSIS — N2581 Secondary hyperparathyroidism of renal origin: Secondary | ICD-10-CM | POA: Diagnosis not present

## 2023-03-16 DIAGNOSIS — J61 Pneumoconiosis due to asbestos and other mineral fibers: Secondary | ICD-10-CM | POA: Diagnosis not present

## 2023-03-16 DIAGNOSIS — N186 End stage renal disease: Secondary | ICD-10-CM | POA: Diagnosis not present

## 2023-03-16 DIAGNOSIS — Z7902 Long term (current) use of antithrombotics/antiplatelets: Secondary | ICD-10-CM | POA: Diagnosis not present

## 2023-03-16 DIAGNOSIS — E1142 Type 2 diabetes mellitus with diabetic polyneuropathy: Secondary | ICD-10-CM | POA: Diagnosis not present

## 2023-03-16 DIAGNOSIS — Z992 Dependence on renal dialysis: Secondary | ICD-10-CM | POA: Diagnosis not present

## 2023-03-16 DIAGNOSIS — R32 Unspecified urinary incontinence: Secondary | ICD-10-CM | POA: Diagnosis not present

## 2023-03-16 DIAGNOSIS — Z8631 Personal history of diabetic foot ulcer: Secondary | ICD-10-CM | POA: Diagnosis not present

## 2023-03-16 DIAGNOSIS — I69354 Hemiplegia and hemiparesis following cerebral infarction affecting left non-dominant side: Secondary | ICD-10-CM | POA: Diagnosis not present

## 2023-03-16 DIAGNOSIS — E1122 Type 2 diabetes mellitus with diabetic chronic kidney disease: Secondary | ICD-10-CM | POA: Diagnosis not present

## 2023-03-16 DIAGNOSIS — E7800 Pure hypercholesterolemia, unspecified: Secondary | ICD-10-CM | POA: Diagnosis not present

## 2023-03-16 DIAGNOSIS — I120 Hypertensive chronic kidney disease with stage 5 chronic kidney disease or end stage renal disease: Secondary | ICD-10-CM | POA: Diagnosis not present

## 2023-03-16 DIAGNOSIS — Z794 Long term (current) use of insulin: Secondary | ICD-10-CM | POA: Diagnosis not present

## 2023-03-16 DIAGNOSIS — E1165 Type 2 diabetes mellitus with hyperglycemia: Secondary | ICD-10-CM | POA: Diagnosis not present

## 2023-03-18 DIAGNOSIS — D631 Anemia in chronic kidney disease: Secondary | ICD-10-CM | POA: Diagnosis not present

## 2023-03-18 DIAGNOSIS — Z992 Dependence on renal dialysis: Secondary | ICD-10-CM | POA: Diagnosis not present

## 2023-03-18 DIAGNOSIS — N2581 Secondary hyperparathyroidism of renal origin: Secondary | ICD-10-CM | POA: Diagnosis not present

## 2023-03-18 DIAGNOSIS — N186 End stage renal disease: Secondary | ICD-10-CM | POA: Diagnosis not present

## 2023-03-19 DIAGNOSIS — N186 End stage renal disease: Secondary | ICD-10-CM | POA: Diagnosis not present

## 2023-03-19 DIAGNOSIS — E1122 Type 2 diabetes mellitus with diabetic chronic kidney disease: Secondary | ICD-10-CM | POA: Diagnosis not present

## 2023-03-19 DIAGNOSIS — I69354 Hemiplegia and hemiparesis following cerebral infarction affecting left non-dominant side: Secondary | ICD-10-CM | POA: Diagnosis not present

## 2023-03-19 DIAGNOSIS — E1142 Type 2 diabetes mellitus with diabetic polyneuropathy: Secondary | ICD-10-CM | POA: Diagnosis not present

## 2023-03-19 DIAGNOSIS — Z992 Dependence on renal dialysis: Secondary | ICD-10-CM | POA: Diagnosis not present

## 2023-03-19 DIAGNOSIS — I120 Hypertensive chronic kidney disease with stage 5 chronic kidney disease or end stage renal disease: Secondary | ICD-10-CM | POA: Diagnosis not present

## 2023-03-20 DIAGNOSIS — E1122 Type 2 diabetes mellitus with diabetic chronic kidney disease: Secondary | ICD-10-CM | POA: Diagnosis not present

## 2023-03-20 DIAGNOSIS — I120 Hypertensive chronic kidney disease with stage 5 chronic kidney disease or end stage renal disease: Secondary | ICD-10-CM | POA: Diagnosis not present

## 2023-03-20 DIAGNOSIS — N2581 Secondary hyperparathyroidism of renal origin: Secondary | ICD-10-CM | POA: Diagnosis not present

## 2023-03-20 DIAGNOSIS — N186 End stage renal disease: Secondary | ICD-10-CM | POA: Diagnosis not present

## 2023-03-20 DIAGNOSIS — Z992 Dependence on renal dialysis: Secondary | ICD-10-CM | POA: Diagnosis not present

## 2023-03-20 DIAGNOSIS — D631 Anemia in chronic kidney disease: Secondary | ICD-10-CM | POA: Diagnosis not present

## 2023-03-21 DIAGNOSIS — Z299 Encounter for prophylactic measures, unspecified: Secondary | ICD-10-CM | POA: Diagnosis not present

## 2023-03-21 DIAGNOSIS — R531 Weakness: Secondary | ICD-10-CM | POA: Diagnosis not present

## 2023-03-21 DIAGNOSIS — E1169 Type 2 diabetes mellitus with other specified complication: Secondary | ICD-10-CM | POA: Diagnosis not present

## 2023-03-21 DIAGNOSIS — I10 Essential (primary) hypertension: Secondary | ICD-10-CM | POA: Diagnosis not present

## 2023-03-21 DIAGNOSIS — N186 End stage renal disease: Secondary | ICD-10-CM | POA: Diagnosis not present

## 2023-03-22 DIAGNOSIS — Z992 Dependence on renal dialysis: Secondary | ICD-10-CM | POA: Diagnosis not present

## 2023-03-22 DIAGNOSIS — D631 Anemia in chronic kidney disease: Secondary | ICD-10-CM | POA: Diagnosis not present

## 2023-03-22 DIAGNOSIS — N186 End stage renal disease: Secondary | ICD-10-CM | POA: Diagnosis not present

## 2023-03-22 DIAGNOSIS — N2581 Secondary hyperparathyroidism of renal origin: Secondary | ICD-10-CM | POA: Diagnosis not present

## 2023-03-23 DIAGNOSIS — N186 End stage renal disease: Secondary | ICD-10-CM | POA: Diagnosis not present

## 2023-03-23 DIAGNOSIS — I69354 Hemiplegia and hemiparesis following cerebral infarction affecting left non-dominant side: Secondary | ICD-10-CM | POA: Diagnosis not present

## 2023-03-23 DIAGNOSIS — I120 Hypertensive chronic kidney disease with stage 5 chronic kidney disease or end stage renal disease: Secondary | ICD-10-CM | POA: Diagnosis not present

## 2023-03-23 DIAGNOSIS — E1142 Type 2 diabetes mellitus with diabetic polyneuropathy: Secondary | ICD-10-CM | POA: Diagnosis not present

## 2023-03-23 DIAGNOSIS — Z992 Dependence on renal dialysis: Secondary | ICD-10-CM | POA: Diagnosis not present

## 2023-03-23 DIAGNOSIS — E1122 Type 2 diabetes mellitus with diabetic chronic kidney disease: Secondary | ICD-10-CM | POA: Diagnosis not present

## 2023-03-24 DIAGNOSIS — N186 End stage renal disease: Secondary | ICD-10-CM | POA: Diagnosis not present

## 2023-03-24 DIAGNOSIS — N2581 Secondary hyperparathyroidism of renal origin: Secondary | ICD-10-CM | POA: Diagnosis not present

## 2023-03-24 DIAGNOSIS — D631 Anemia in chronic kidney disease: Secondary | ICD-10-CM | POA: Diagnosis not present

## 2023-03-24 DIAGNOSIS — Z992 Dependence on renal dialysis: Secondary | ICD-10-CM | POA: Diagnosis not present

## 2023-03-26 DIAGNOSIS — N186 End stage renal disease: Secondary | ICD-10-CM | POA: Diagnosis not present

## 2023-03-26 DIAGNOSIS — I120 Hypertensive chronic kidney disease with stage 5 chronic kidney disease or end stage renal disease: Secondary | ICD-10-CM | POA: Diagnosis not present

## 2023-03-26 DIAGNOSIS — E1142 Type 2 diabetes mellitus with diabetic polyneuropathy: Secondary | ICD-10-CM | POA: Diagnosis not present

## 2023-03-26 DIAGNOSIS — Z992 Dependence on renal dialysis: Secondary | ICD-10-CM | POA: Diagnosis not present

## 2023-03-26 DIAGNOSIS — N2581 Secondary hyperparathyroidism of renal origin: Secondary | ICD-10-CM | POA: Diagnosis not present

## 2023-03-26 DIAGNOSIS — E1122 Type 2 diabetes mellitus with diabetic chronic kidney disease: Secondary | ICD-10-CM | POA: Diagnosis not present

## 2023-03-26 DIAGNOSIS — D631 Anemia in chronic kidney disease: Secondary | ICD-10-CM | POA: Diagnosis not present

## 2023-03-26 DIAGNOSIS — I69354 Hemiplegia and hemiparesis following cerebral infarction affecting left non-dominant side: Secondary | ICD-10-CM | POA: Diagnosis not present

## 2023-03-29 DIAGNOSIS — N186 End stage renal disease: Secondary | ICD-10-CM | POA: Diagnosis not present

## 2023-03-29 DIAGNOSIS — Z992 Dependence on renal dialysis: Secondary | ICD-10-CM | POA: Diagnosis not present

## 2023-03-29 DIAGNOSIS — N2581 Secondary hyperparathyroidism of renal origin: Secondary | ICD-10-CM | POA: Diagnosis not present

## 2023-03-29 DIAGNOSIS — D631 Anemia in chronic kidney disease: Secondary | ICD-10-CM | POA: Diagnosis not present

## 2023-03-31 DIAGNOSIS — N186 End stage renal disease: Secondary | ICD-10-CM | POA: Diagnosis not present

## 2023-03-31 DIAGNOSIS — D631 Anemia in chronic kidney disease: Secondary | ICD-10-CM | POA: Diagnosis not present

## 2023-03-31 DIAGNOSIS — Z992 Dependence on renal dialysis: Secondary | ICD-10-CM | POA: Diagnosis not present

## 2023-03-31 DIAGNOSIS — N2581 Secondary hyperparathyroidism of renal origin: Secondary | ICD-10-CM | POA: Diagnosis not present

## 2023-04-01 DIAGNOSIS — I69354 Hemiplegia and hemiparesis following cerebral infarction affecting left non-dominant side: Secondary | ICD-10-CM | POA: Diagnosis not present

## 2023-04-01 DIAGNOSIS — E1142 Type 2 diabetes mellitus with diabetic polyneuropathy: Secondary | ICD-10-CM | POA: Diagnosis not present

## 2023-04-01 DIAGNOSIS — E1122 Type 2 diabetes mellitus with diabetic chronic kidney disease: Secondary | ICD-10-CM | POA: Diagnosis not present

## 2023-04-01 DIAGNOSIS — I120 Hypertensive chronic kidney disease with stage 5 chronic kidney disease or end stage renal disease: Secondary | ICD-10-CM | POA: Diagnosis not present

## 2023-04-01 DIAGNOSIS — Z992 Dependence on renal dialysis: Secondary | ICD-10-CM | POA: Diagnosis not present

## 2023-04-01 DIAGNOSIS — N186 End stage renal disease: Secondary | ICD-10-CM | POA: Diagnosis not present

## 2023-04-02 DIAGNOSIS — Z992 Dependence on renal dialysis: Secondary | ICD-10-CM | POA: Diagnosis not present

## 2023-04-02 DIAGNOSIS — N186 End stage renal disease: Secondary | ICD-10-CM | POA: Diagnosis not present

## 2023-04-02 DIAGNOSIS — D631 Anemia in chronic kidney disease: Secondary | ICD-10-CM | POA: Diagnosis not present

## 2023-04-02 DIAGNOSIS — N2581 Secondary hyperparathyroidism of renal origin: Secondary | ICD-10-CM | POA: Diagnosis not present

## 2023-04-04 DIAGNOSIS — N186 End stage renal disease: Secondary | ICD-10-CM | POA: Diagnosis not present

## 2023-04-04 DIAGNOSIS — Z992 Dependence on renal dialysis: Secondary | ICD-10-CM | POA: Diagnosis not present

## 2023-04-04 DIAGNOSIS — E1142 Type 2 diabetes mellitus with diabetic polyneuropathy: Secondary | ICD-10-CM | POA: Diagnosis not present

## 2023-04-04 DIAGNOSIS — I120 Hypertensive chronic kidney disease with stage 5 chronic kidney disease or end stage renal disease: Secondary | ICD-10-CM | POA: Diagnosis not present

## 2023-04-04 DIAGNOSIS — I69354 Hemiplegia and hemiparesis following cerebral infarction affecting left non-dominant side: Secondary | ICD-10-CM | POA: Diagnosis not present

## 2023-04-04 DIAGNOSIS — E1122 Type 2 diabetes mellitus with diabetic chronic kidney disease: Secondary | ICD-10-CM | POA: Diagnosis not present

## 2023-04-05 DIAGNOSIS — N186 End stage renal disease: Secondary | ICD-10-CM | POA: Diagnosis not present

## 2023-04-05 DIAGNOSIS — D631 Anemia in chronic kidney disease: Secondary | ICD-10-CM | POA: Diagnosis not present

## 2023-04-05 DIAGNOSIS — Z992 Dependence on renal dialysis: Secondary | ICD-10-CM | POA: Diagnosis not present

## 2023-04-05 DIAGNOSIS — T8249XA Other complication of vascular dialysis catheter, initial encounter: Secondary | ICD-10-CM | POA: Diagnosis not present

## 2023-04-05 DIAGNOSIS — N2581 Secondary hyperparathyroidism of renal origin: Secondary | ICD-10-CM | POA: Diagnosis not present

## 2023-04-08 DIAGNOSIS — N186 End stage renal disease: Secondary | ICD-10-CM | POA: Diagnosis not present

## 2023-04-08 DIAGNOSIS — Z992 Dependence on renal dialysis: Secondary | ICD-10-CM | POA: Diagnosis not present

## 2023-04-08 DIAGNOSIS — N2581 Secondary hyperparathyroidism of renal origin: Secondary | ICD-10-CM | POA: Diagnosis not present

## 2023-04-08 DIAGNOSIS — T8249XA Other complication of vascular dialysis catheter, initial encounter: Secondary | ICD-10-CM | POA: Diagnosis not present

## 2023-04-08 DIAGNOSIS — D631 Anemia in chronic kidney disease: Secondary | ICD-10-CM | POA: Diagnosis not present

## 2023-04-09 DIAGNOSIS — R112 Nausea with vomiting, unspecified: Secondary | ICD-10-CM | POA: Diagnosis not present

## 2023-04-09 DIAGNOSIS — Z9889 Other specified postprocedural states: Secondary | ICD-10-CM | POA: Diagnosis not present

## 2023-04-09 DIAGNOSIS — E1122 Type 2 diabetes mellitus with diabetic chronic kidney disease: Secondary | ICD-10-CM | POA: Diagnosis not present

## 2023-04-09 DIAGNOSIS — Z992 Dependence on renal dialysis: Secondary | ICD-10-CM | POA: Diagnosis not present

## 2023-04-09 DIAGNOSIS — I120 Hypertensive chronic kidney disease with stage 5 chronic kidney disease or end stage renal disease: Secondary | ICD-10-CM | POA: Diagnosis not present

## 2023-04-09 DIAGNOSIS — E1142 Type 2 diabetes mellitus with diabetic polyneuropathy: Secondary | ICD-10-CM | POA: Diagnosis not present

## 2023-04-09 DIAGNOSIS — N186 End stage renal disease: Secondary | ICD-10-CM | POA: Diagnosis not present

## 2023-04-09 DIAGNOSIS — I69354 Hemiplegia and hemiparesis following cerebral infarction affecting left non-dominant side: Secondary | ICD-10-CM | POA: Diagnosis not present

## 2023-04-10 DIAGNOSIS — T8249XA Other complication of vascular dialysis catheter, initial encounter: Secondary | ICD-10-CM | POA: Diagnosis not present

## 2023-04-10 DIAGNOSIS — N2581 Secondary hyperparathyroidism of renal origin: Secondary | ICD-10-CM | POA: Diagnosis not present

## 2023-04-10 DIAGNOSIS — Z992 Dependence on renal dialysis: Secondary | ICD-10-CM | POA: Diagnosis not present

## 2023-04-10 DIAGNOSIS — N186 End stage renal disease: Secondary | ICD-10-CM | POA: Diagnosis not present

## 2023-04-10 DIAGNOSIS — D631 Anemia in chronic kidney disease: Secondary | ICD-10-CM | POA: Diagnosis not present

## 2023-04-11 DIAGNOSIS — E1142 Type 2 diabetes mellitus with diabetic polyneuropathy: Secondary | ICD-10-CM | POA: Diagnosis not present

## 2023-04-11 DIAGNOSIS — I120 Hypertensive chronic kidney disease with stage 5 chronic kidney disease or end stage renal disease: Secondary | ICD-10-CM | POA: Diagnosis not present

## 2023-04-11 DIAGNOSIS — N186 End stage renal disease: Secondary | ICD-10-CM | POA: Diagnosis not present

## 2023-04-11 DIAGNOSIS — I69354 Hemiplegia and hemiparesis following cerebral infarction affecting left non-dominant side: Secondary | ICD-10-CM | POA: Diagnosis not present

## 2023-04-11 DIAGNOSIS — E1122 Type 2 diabetes mellitus with diabetic chronic kidney disease: Secondary | ICD-10-CM | POA: Diagnosis not present

## 2023-04-11 DIAGNOSIS — Z992 Dependence on renal dialysis: Secondary | ICD-10-CM | POA: Diagnosis not present

## 2023-04-12 DIAGNOSIS — Z992 Dependence on renal dialysis: Secondary | ICD-10-CM | POA: Diagnosis not present

## 2023-04-12 DIAGNOSIS — N186 End stage renal disease: Secondary | ICD-10-CM | POA: Diagnosis not present

## 2023-04-12 DIAGNOSIS — D631 Anemia in chronic kidney disease: Secondary | ICD-10-CM | POA: Diagnosis not present

## 2023-04-12 DIAGNOSIS — T8249XA Other complication of vascular dialysis catheter, initial encounter: Secondary | ICD-10-CM | POA: Diagnosis not present

## 2023-04-12 DIAGNOSIS — N2581 Secondary hyperparathyroidism of renal origin: Secondary | ICD-10-CM | POA: Diagnosis not present

## 2023-04-15 DIAGNOSIS — N2581 Secondary hyperparathyroidism of renal origin: Secondary | ICD-10-CM | POA: Diagnosis not present

## 2023-04-15 DIAGNOSIS — R32 Unspecified urinary incontinence: Secondary | ICD-10-CM | POA: Diagnosis not present

## 2023-04-15 DIAGNOSIS — E1165 Type 2 diabetes mellitus with hyperglycemia: Secondary | ICD-10-CM | POA: Diagnosis not present

## 2023-04-15 DIAGNOSIS — Z7902 Long term (current) use of antithrombotics/antiplatelets: Secondary | ICD-10-CM | POA: Diagnosis not present

## 2023-04-15 DIAGNOSIS — D631 Anemia in chronic kidney disease: Secondary | ICD-10-CM | POA: Diagnosis not present

## 2023-04-15 DIAGNOSIS — I120 Hypertensive chronic kidney disease with stage 5 chronic kidney disease or end stage renal disease: Secondary | ICD-10-CM | POA: Diagnosis not present

## 2023-04-15 DIAGNOSIS — E1122 Type 2 diabetes mellitus with diabetic chronic kidney disease: Secondary | ICD-10-CM | POA: Diagnosis not present

## 2023-04-15 DIAGNOSIS — E1142 Type 2 diabetes mellitus with diabetic polyneuropathy: Secondary | ICD-10-CM | POA: Diagnosis not present

## 2023-04-15 DIAGNOSIS — Z992 Dependence on renal dialysis: Secondary | ICD-10-CM | POA: Diagnosis not present

## 2023-04-15 DIAGNOSIS — T8249XA Other complication of vascular dialysis catheter, initial encounter: Secondary | ICD-10-CM | POA: Diagnosis not present

## 2023-04-15 DIAGNOSIS — I69354 Hemiplegia and hemiparesis following cerebral infarction affecting left non-dominant side: Secondary | ICD-10-CM | POA: Diagnosis not present

## 2023-04-15 DIAGNOSIS — J61 Pneumoconiosis due to asbestos and other mineral fibers: Secondary | ICD-10-CM | POA: Diagnosis not present

## 2023-04-15 DIAGNOSIS — E7800 Pure hypercholesterolemia, unspecified: Secondary | ICD-10-CM | POA: Diagnosis not present

## 2023-04-15 DIAGNOSIS — N186 End stage renal disease: Secondary | ICD-10-CM | POA: Diagnosis not present

## 2023-04-15 DIAGNOSIS — Z794 Long term (current) use of insulin: Secondary | ICD-10-CM | POA: Diagnosis not present

## 2023-04-15 DIAGNOSIS — Z8631 Personal history of diabetic foot ulcer: Secondary | ICD-10-CM | POA: Diagnosis not present

## 2023-04-16 DIAGNOSIS — N186 End stage renal disease: Secondary | ICD-10-CM | POA: Diagnosis not present

## 2023-04-16 DIAGNOSIS — Z992 Dependence on renal dialysis: Secondary | ICD-10-CM | POA: Diagnosis not present

## 2023-04-16 DIAGNOSIS — L600 Ingrowing nail: Secondary | ICD-10-CM | POA: Diagnosis not present

## 2023-04-16 DIAGNOSIS — E1142 Type 2 diabetes mellitus with diabetic polyneuropathy: Secondary | ICD-10-CM | POA: Diagnosis not present

## 2023-04-16 DIAGNOSIS — I69354 Hemiplegia and hemiparesis following cerebral infarction affecting left non-dominant side: Secondary | ICD-10-CM | POA: Diagnosis not present

## 2023-04-16 DIAGNOSIS — I120 Hypertensive chronic kidney disease with stage 5 chronic kidney disease or end stage renal disease: Secondary | ICD-10-CM | POA: Diagnosis not present

## 2023-04-16 DIAGNOSIS — E1122 Type 2 diabetes mellitus with diabetic chronic kidney disease: Secondary | ICD-10-CM | POA: Diagnosis not present

## 2023-04-16 DIAGNOSIS — Z89421 Acquired absence of other right toe(s): Secondary | ICD-10-CM | POA: Diagnosis not present

## 2023-04-17 DIAGNOSIS — N2581 Secondary hyperparathyroidism of renal origin: Secondary | ICD-10-CM | POA: Diagnosis not present

## 2023-04-17 DIAGNOSIS — Z992 Dependence on renal dialysis: Secondary | ICD-10-CM | POA: Diagnosis not present

## 2023-04-17 DIAGNOSIS — N186 End stage renal disease: Secondary | ICD-10-CM | POA: Diagnosis not present

## 2023-04-17 DIAGNOSIS — D631 Anemia in chronic kidney disease: Secondary | ICD-10-CM | POA: Diagnosis not present

## 2023-04-17 DIAGNOSIS — T8249XA Other complication of vascular dialysis catheter, initial encounter: Secondary | ICD-10-CM | POA: Diagnosis not present

## 2023-04-18 DIAGNOSIS — N186 End stage renal disease: Secondary | ICD-10-CM | POA: Diagnosis not present

## 2023-04-18 DIAGNOSIS — E1122 Type 2 diabetes mellitus with diabetic chronic kidney disease: Secondary | ICD-10-CM | POA: Diagnosis not present

## 2023-04-18 DIAGNOSIS — E1142 Type 2 diabetes mellitus with diabetic polyneuropathy: Secondary | ICD-10-CM | POA: Diagnosis not present

## 2023-04-18 DIAGNOSIS — Z992 Dependence on renal dialysis: Secondary | ICD-10-CM | POA: Diagnosis not present

## 2023-04-18 DIAGNOSIS — I120 Hypertensive chronic kidney disease with stage 5 chronic kidney disease or end stage renal disease: Secondary | ICD-10-CM | POA: Diagnosis not present

## 2023-04-18 DIAGNOSIS — I69354 Hemiplegia and hemiparesis following cerebral infarction affecting left non-dominant side: Secondary | ICD-10-CM | POA: Diagnosis not present

## 2023-04-19 DIAGNOSIS — N186 End stage renal disease: Secondary | ICD-10-CM | POA: Diagnosis not present

## 2023-04-19 DIAGNOSIS — T8249XA Other complication of vascular dialysis catheter, initial encounter: Secondary | ICD-10-CM | POA: Diagnosis not present

## 2023-04-19 DIAGNOSIS — D631 Anemia in chronic kidney disease: Secondary | ICD-10-CM | POA: Diagnosis not present

## 2023-04-19 DIAGNOSIS — Z992 Dependence on renal dialysis: Secondary | ICD-10-CM | POA: Diagnosis not present

## 2023-04-19 DIAGNOSIS — N2581 Secondary hyperparathyroidism of renal origin: Secondary | ICD-10-CM | POA: Diagnosis not present

## 2023-04-22 DIAGNOSIS — Z992 Dependence on renal dialysis: Secondary | ICD-10-CM | POA: Diagnosis not present

## 2023-04-22 DIAGNOSIS — N2581 Secondary hyperparathyroidism of renal origin: Secondary | ICD-10-CM | POA: Diagnosis not present

## 2023-04-22 DIAGNOSIS — T8249XA Other complication of vascular dialysis catheter, initial encounter: Secondary | ICD-10-CM | POA: Diagnosis not present

## 2023-04-22 DIAGNOSIS — D631 Anemia in chronic kidney disease: Secondary | ICD-10-CM | POA: Diagnosis not present

## 2023-04-22 DIAGNOSIS — N186 End stage renal disease: Secondary | ICD-10-CM | POA: Diagnosis not present

## 2023-04-23 DIAGNOSIS — Z992 Dependence on renal dialysis: Secondary | ICD-10-CM | POA: Diagnosis not present

## 2023-04-23 DIAGNOSIS — E1142 Type 2 diabetes mellitus with diabetic polyneuropathy: Secondary | ICD-10-CM | POA: Diagnosis not present

## 2023-04-23 DIAGNOSIS — N186 End stage renal disease: Secondary | ICD-10-CM | POA: Diagnosis not present

## 2023-04-23 DIAGNOSIS — I120 Hypertensive chronic kidney disease with stage 5 chronic kidney disease or end stage renal disease: Secondary | ICD-10-CM | POA: Diagnosis not present

## 2023-04-23 DIAGNOSIS — E1122 Type 2 diabetes mellitus with diabetic chronic kidney disease: Secondary | ICD-10-CM | POA: Diagnosis not present

## 2023-04-23 DIAGNOSIS — I69354 Hemiplegia and hemiparesis following cerebral infarction affecting left non-dominant side: Secondary | ICD-10-CM | POA: Diagnosis not present

## 2023-04-24 DIAGNOSIS — Z992 Dependence on renal dialysis: Secondary | ICD-10-CM | POA: Diagnosis not present

## 2023-04-24 DIAGNOSIS — D631 Anemia in chronic kidney disease: Secondary | ICD-10-CM | POA: Diagnosis not present

## 2023-04-24 DIAGNOSIS — N186 End stage renal disease: Secondary | ICD-10-CM | POA: Diagnosis not present

## 2023-04-24 DIAGNOSIS — T8249XA Other complication of vascular dialysis catheter, initial encounter: Secondary | ICD-10-CM | POA: Diagnosis not present

## 2023-04-24 DIAGNOSIS — N2581 Secondary hyperparathyroidism of renal origin: Secondary | ICD-10-CM | POA: Diagnosis not present

## 2023-04-25 DIAGNOSIS — Z992 Dependence on renal dialysis: Secondary | ICD-10-CM | POA: Diagnosis not present

## 2023-04-25 DIAGNOSIS — I69354 Hemiplegia and hemiparesis following cerebral infarction affecting left non-dominant side: Secondary | ICD-10-CM | POA: Diagnosis not present

## 2023-04-25 DIAGNOSIS — E1122 Type 2 diabetes mellitus with diabetic chronic kidney disease: Secondary | ICD-10-CM | POA: Diagnosis not present

## 2023-04-25 DIAGNOSIS — I120 Hypertensive chronic kidney disease with stage 5 chronic kidney disease or end stage renal disease: Secondary | ICD-10-CM | POA: Diagnosis not present

## 2023-04-25 DIAGNOSIS — N186 End stage renal disease: Secondary | ICD-10-CM | POA: Diagnosis not present

## 2023-04-25 DIAGNOSIS — E1142 Type 2 diabetes mellitus with diabetic polyneuropathy: Secondary | ICD-10-CM | POA: Diagnosis not present

## 2023-04-26 DIAGNOSIS — Z992 Dependence on renal dialysis: Secondary | ICD-10-CM | POA: Diagnosis not present

## 2023-04-26 DIAGNOSIS — D631 Anemia in chronic kidney disease: Secondary | ICD-10-CM | POA: Diagnosis not present

## 2023-04-26 DIAGNOSIS — N2581 Secondary hyperparathyroidism of renal origin: Secondary | ICD-10-CM | POA: Diagnosis not present

## 2023-04-26 DIAGNOSIS — N186 End stage renal disease: Secondary | ICD-10-CM | POA: Diagnosis not present

## 2023-04-26 DIAGNOSIS — T8249XA Other complication of vascular dialysis catheter, initial encounter: Secondary | ICD-10-CM | POA: Diagnosis not present

## 2023-04-29 DIAGNOSIS — N186 End stage renal disease: Secondary | ICD-10-CM | POA: Diagnosis not present

## 2023-04-29 DIAGNOSIS — N2581 Secondary hyperparathyroidism of renal origin: Secondary | ICD-10-CM | POA: Diagnosis not present

## 2023-04-29 DIAGNOSIS — T8249XA Other complication of vascular dialysis catheter, initial encounter: Secondary | ICD-10-CM | POA: Diagnosis not present

## 2023-04-29 DIAGNOSIS — Z992 Dependence on renal dialysis: Secondary | ICD-10-CM | POA: Diagnosis not present

## 2023-04-29 DIAGNOSIS — D631 Anemia in chronic kidney disease: Secondary | ICD-10-CM | POA: Diagnosis not present

## 2023-04-30 DIAGNOSIS — N186 End stage renal disease: Secondary | ICD-10-CM | POA: Diagnosis not present

## 2023-04-30 DIAGNOSIS — I69354 Hemiplegia and hemiparesis following cerebral infarction affecting left non-dominant side: Secondary | ICD-10-CM | POA: Diagnosis not present

## 2023-04-30 DIAGNOSIS — E1122 Type 2 diabetes mellitus with diabetic chronic kidney disease: Secondary | ICD-10-CM | POA: Diagnosis not present

## 2023-04-30 DIAGNOSIS — I120 Hypertensive chronic kidney disease with stage 5 chronic kidney disease or end stage renal disease: Secondary | ICD-10-CM | POA: Diagnosis not present

## 2023-04-30 DIAGNOSIS — E1142 Type 2 diabetes mellitus with diabetic polyneuropathy: Secondary | ICD-10-CM | POA: Diagnosis not present

## 2023-04-30 DIAGNOSIS — Z992 Dependence on renal dialysis: Secondary | ICD-10-CM | POA: Diagnosis not present

## 2023-05-01 DIAGNOSIS — Z992 Dependence on renal dialysis: Secondary | ICD-10-CM | POA: Diagnosis not present

## 2023-05-01 DIAGNOSIS — D631 Anemia in chronic kidney disease: Secondary | ICD-10-CM | POA: Diagnosis not present

## 2023-05-01 DIAGNOSIS — N186 End stage renal disease: Secondary | ICD-10-CM | POA: Diagnosis not present

## 2023-05-01 DIAGNOSIS — N2581 Secondary hyperparathyroidism of renal origin: Secondary | ICD-10-CM | POA: Diagnosis not present

## 2023-05-01 DIAGNOSIS — T8249XA Other complication of vascular dialysis catheter, initial encounter: Secondary | ICD-10-CM | POA: Diagnosis not present

## 2023-05-02 DIAGNOSIS — E1122 Type 2 diabetes mellitus with diabetic chronic kidney disease: Secondary | ICD-10-CM | POA: Diagnosis not present

## 2023-05-02 DIAGNOSIS — Z992 Dependence on renal dialysis: Secondary | ICD-10-CM | POA: Diagnosis not present

## 2023-05-02 DIAGNOSIS — N186 End stage renal disease: Secondary | ICD-10-CM | POA: Diagnosis not present

## 2023-05-02 DIAGNOSIS — I69354 Hemiplegia and hemiparesis following cerebral infarction affecting left non-dominant side: Secondary | ICD-10-CM | POA: Diagnosis not present

## 2023-05-02 DIAGNOSIS — E1142 Type 2 diabetes mellitus with diabetic polyneuropathy: Secondary | ICD-10-CM | POA: Diagnosis not present

## 2023-05-02 DIAGNOSIS — I120 Hypertensive chronic kidney disease with stage 5 chronic kidney disease or end stage renal disease: Secondary | ICD-10-CM | POA: Diagnosis not present

## 2023-05-03 DIAGNOSIS — D631 Anemia in chronic kidney disease: Secondary | ICD-10-CM | POA: Diagnosis not present

## 2023-05-03 DIAGNOSIS — T8249XA Other complication of vascular dialysis catheter, initial encounter: Secondary | ICD-10-CM | POA: Diagnosis not present

## 2023-05-03 DIAGNOSIS — Z992 Dependence on renal dialysis: Secondary | ICD-10-CM | POA: Diagnosis not present

## 2023-05-03 DIAGNOSIS — N186 End stage renal disease: Secondary | ICD-10-CM | POA: Diagnosis not present

## 2023-05-03 DIAGNOSIS — N2581 Secondary hyperparathyroidism of renal origin: Secondary | ICD-10-CM | POA: Diagnosis not present

## 2023-05-06 DIAGNOSIS — N2581 Secondary hyperparathyroidism of renal origin: Secondary | ICD-10-CM | POA: Diagnosis not present

## 2023-05-06 DIAGNOSIS — N186 End stage renal disease: Secondary | ICD-10-CM | POA: Diagnosis not present

## 2023-05-06 DIAGNOSIS — D631 Anemia in chronic kidney disease: Secondary | ICD-10-CM | POA: Diagnosis not present

## 2023-05-06 DIAGNOSIS — Z992 Dependence on renal dialysis: Secondary | ICD-10-CM | POA: Diagnosis not present

## 2023-05-07 DIAGNOSIS — E1142 Type 2 diabetes mellitus with diabetic polyneuropathy: Secondary | ICD-10-CM | POA: Diagnosis not present

## 2023-05-07 DIAGNOSIS — I69354 Hemiplegia and hemiparesis following cerebral infarction affecting left non-dominant side: Secondary | ICD-10-CM | POA: Diagnosis not present

## 2023-05-07 DIAGNOSIS — I120 Hypertensive chronic kidney disease with stage 5 chronic kidney disease or end stage renal disease: Secondary | ICD-10-CM | POA: Diagnosis not present

## 2023-05-07 DIAGNOSIS — Z992 Dependence on renal dialysis: Secondary | ICD-10-CM | POA: Diagnosis not present

## 2023-05-07 DIAGNOSIS — E1122 Type 2 diabetes mellitus with diabetic chronic kidney disease: Secondary | ICD-10-CM | POA: Diagnosis not present

## 2023-05-07 DIAGNOSIS — N186 End stage renal disease: Secondary | ICD-10-CM | POA: Diagnosis not present

## 2023-05-08 DIAGNOSIS — D631 Anemia in chronic kidney disease: Secondary | ICD-10-CM | POA: Diagnosis not present

## 2023-05-08 DIAGNOSIS — N2581 Secondary hyperparathyroidism of renal origin: Secondary | ICD-10-CM | POA: Diagnosis not present

## 2023-05-08 DIAGNOSIS — Z992 Dependence on renal dialysis: Secondary | ICD-10-CM | POA: Diagnosis not present

## 2023-05-08 DIAGNOSIS — N186 End stage renal disease: Secondary | ICD-10-CM | POA: Diagnosis not present

## 2023-05-09 DIAGNOSIS — E1142 Type 2 diabetes mellitus with diabetic polyneuropathy: Secondary | ICD-10-CM | POA: Diagnosis not present

## 2023-05-09 DIAGNOSIS — N186 End stage renal disease: Secondary | ICD-10-CM | POA: Diagnosis not present

## 2023-05-09 DIAGNOSIS — Z992 Dependence on renal dialysis: Secondary | ICD-10-CM | POA: Diagnosis not present

## 2023-05-09 DIAGNOSIS — I120 Hypertensive chronic kidney disease with stage 5 chronic kidney disease or end stage renal disease: Secondary | ICD-10-CM | POA: Diagnosis not present

## 2023-05-09 DIAGNOSIS — I69354 Hemiplegia and hemiparesis following cerebral infarction affecting left non-dominant side: Secondary | ICD-10-CM | POA: Diagnosis not present

## 2023-05-09 DIAGNOSIS — E1122 Type 2 diabetes mellitus with diabetic chronic kidney disease: Secondary | ICD-10-CM | POA: Diagnosis not present

## 2023-05-10 DIAGNOSIS — Z992 Dependence on renal dialysis: Secondary | ICD-10-CM | POA: Diagnosis not present

## 2023-05-10 DIAGNOSIS — N2581 Secondary hyperparathyroidism of renal origin: Secondary | ICD-10-CM | POA: Diagnosis not present

## 2023-05-10 DIAGNOSIS — D631 Anemia in chronic kidney disease: Secondary | ICD-10-CM | POA: Diagnosis not present

## 2023-05-10 DIAGNOSIS — N186 End stage renal disease: Secondary | ICD-10-CM | POA: Diagnosis not present

## 2023-05-13 DIAGNOSIS — N2581 Secondary hyperparathyroidism of renal origin: Secondary | ICD-10-CM | POA: Diagnosis not present

## 2023-05-13 DIAGNOSIS — Z992 Dependence on renal dialysis: Secondary | ICD-10-CM | POA: Diagnosis not present

## 2023-05-13 DIAGNOSIS — D631 Anemia in chronic kidney disease: Secondary | ICD-10-CM | POA: Diagnosis not present

## 2023-05-13 DIAGNOSIS — N186 End stage renal disease: Secondary | ICD-10-CM | POA: Diagnosis not present

## 2023-05-14 DIAGNOSIS — I120 Hypertensive chronic kidney disease with stage 5 chronic kidney disease or end stage renal disease: Secondary | ICD-10-CM | POA: Diagnosis not present

## 2023-05-14 DIAGNOSIS — N186 End stage renal disease: Secondary | ICD-10-CM | POA: Diagnosis not present

## 2023-05-14 DIAGNOSIS — E1122 Type 2 diabetes mellitus with diabetic chronic kidney disease: Secondary | ICD-10-CM | POA: Diagnosis not present

## 2023-05-14 DIAGNOSIS — I69354 Hemiplegia and hemiparesis following cerebral infarction affecting left non-dominant side: Secondary | ICD-10-CM | POA: Diagnosis not present

## 2023-05-14 DIAGNOSIS — Z992 Dependence on renal dialysis: Secondary | ICD-10-CM | POA: Diagnosis not present

## 2023-05-14 DIAGNOSIS — E1142 Type 2 diabetes mellitus with diabetic polyneuropathy: Secondary | ICD-10-CM | POA: Diagnosis not present

## 2023-05-15 DIAGNOSIS — E7800 Pure hypercholesterolemia, unspecified: Secondary | ICD-10-CM | POA: Diagnosis not present

## 2023-05-15 DIAGNOSIS — Z794 Long term (current) use of insulin: Secondary | ICD-10-CM | POA: Diagnosis not present

## 2023-05-15 DIAGNOSIS — E1122 Type 2 diabetes mellitus with diabetic chronic kidney disease: Secondary | ICD-10-CM | POA: Diagnosis not present

## 2023-05-15 DIAGNOSIS — J61 Pneumoconiosis due to asbestos and other mineral fibers: Secondary | ICD-10-CM | POA: Diagnosis not present

## 2023-05-15 DIAGNOSIS — E1165 Type 2 diabetes mellitus with hyperglycemia: Secondary | ICD-10-CM | POA: Diagnosis not present

## 2023-05-15 DIAGNOSIS — Z8631 Personal history of diabetic foot ulcer: Secondary | ICD-10-CM | POA: Diagnosis not present

## 2023-05-15 DIAGNOSIS — N186 End stage renal disease: Secondary | ICD-10-CM | POA: Diagnosis not present

## 2023-05-15 DIAGNOSIS — I120 Hypertensive chronic kidney disease with stage 5 chronic kidney disease or end stage renal disease: Secondary | ICD-10-CM | POA: Diagnosis not present

## 2023-05-15 DIAGNOSIS — Z992 Dependence on renal dialysis: Secondary | ICD-10-CM | POA: Diagnosis not present

## 2023-05-15 DIAGNOSIS — N2581 Secondary hyperparathyroidism of renal origin: Secondary | ICD-10-CM | POA: Diagnosis not present

## 2023-05-15 DIAGNOSIS — D631 Anemia in chronic kidney disease: Secondary | ICD-10-CM | POA: Diagnosis not present

## 2023-05-15 DIAGNOSIS — R32 Unspecified urinary incontinence: Secondary | ICD-10-CM | POA: Diagnosis not present

## 2023-05-15 DIAGNOSIS — E1142 Type 2 diabetes mellitus with diabetic polyneuropathy: Secondary | ICD-10-CM | POA: Diagnosis not present

## 2023-05-15 DIAGNOSIS — Z7902 Long term (current) use of antithrombotics/antiplatelets: Secondary | ICD-10-CM | POA: Diagnosis not present

## 2023-05-15 DIAGNOSIS — I69354 Hemiplegia and hemiparesis following cerebral infarction affecting left non-dominant side: Secondary | ICD-10-CM | POA: Diagnosis not present

## 2023-05-16 DIAGNOSIS — E1122 Type 2 diabetes mellitus with diabetic chronic kidney disease: Secondary | ICD-10-CM | POA: Diagnosis not present

## 2023-05-16 DIAGNOSIS — N186 End stage renal disease: Secondary | ICD-10-CM | POA: Diagnosis not present

## 2023-05-16 DIAGNOSIS — E1142 Type 2 diabetes mellitus with diabetic polyneuropathy: Secondary | ICD-10-CM | POA: Diagnosis not present

## 2023-05-16 DIAGNOSIS — Z992 Dependence on renal dialysis: Secondary | ICD-10-CM | POA: Diagnosis not present

## 2023-05-16 DIAGNOSIS — I69354 Hemiplegia and hemiparesis following cerebral infarction affecting left non-dominant side: Secondary | ICD-10-CM | POA: Diagnosis not present

## 2023-05-16 DIAGNOSIS — I120 Hypertensive chronic kidney disease with stage 5 chronic kidney disease or end stage renal disease: Secondary | ICD-10-CM | POA: Diagnosis not present

## 2023-05-17 DIAGNOSIS — N2581 Secondary hyperparathyroidism of renal origin: Secondary | ICD-10-CM | POA: Diagnosis not present

## 2023-05-17 DIAGNOSIS — N186 End stage renal disease: Secondary | ICD-10-CM | POA: Diagnosis not present

## 2023-05-17 DIAGNOSIS — Z992 Dependence on renal dialysis: Secondary | ICD-10-CM | POA: Diagnosis not present

## 2023-05-17 DIAGNOSIS — D631 Anemia in chronic kidney disease: Secondary | ICD-10-CM | POA: Diagnosis not present

## 2023-05-20 DIAGNOSIS — Z992 Dependence on renal dialysis: Secondary | ICD-10-CM | POA: Diagnosis not present

## 2023-05-20 DIAGNOSIS — D631 Anemia in chronic kidney disease: Secondary | ICD-10-CM | POA: Diagnosis not present

## 2023-05-20 DIAGNOSIS — N186 End stage renal disease: Secondary | ICD-10-CM | POA: Diagnosis not present

## 2023-05-20 DIAGNOSIS — N2581 Secondary hyperparathyroidism of renal origin: Secondary | ICD-10-CM | POA: Diagnosis not present

## 2023-05-21 DIAGNOSIS — N186 End stage renal disease: Secondary | ICD-10-CM | POA: Diagnosis not present

## 2023-05-21 DIAGNOSIS — D631 Anemia in chronic kidney disease: Secondary | ICD-10-CM | POA: Diagnosis not present

## 2023-05-21 DIAGNOSIS — Z992 Dependence on renal dialysis: Secondary | ICD-10-CM | POA: Diagnosis not present

## 2023-05-21 DIAGNOSIS — N2581 Secondary hyperparathyroidism of renal origin: Secondary | ICD-10-CM | POA: Diagnosis not present

## 2023-05-23 DIAGNOSIS — I69354 Hemiplegia and hemiparesis following cerebral infarction affecting left non-dominant side: Secondary | ICD-10-CM | POA: Diagnosis not present

## 2023-05-23 DIAGNOSIS — Z992 Dependence on renal dialysis: Secondary | ICD-10-CM | POA: Diagnosis not present

## 2023-05-23 DIAGNOSIS — I120 Hypertensive chronic kidney disease with stage 5 chronic kidney disease or end stage renal disease: Secondary | ICD-10-CM | POA: Diagnosis not present

## 2023-05-23 DIAGNOSIS — E1142 Type 2 diabetes mellitus with diabetic polyneuropathy: Secondary | ICD-10-CM | POA: Diagnosis not present

## 2023-05-23 DIAGNOSIS — N186 End stage renal disease: Secondary | ICD-10-CM | POA: Diagnosis not present

## 2023-05-23 DIAGNOSIS — E1122 Type 2 diabetes mellitus with diabetic chronic kidney disease: Secondary | ICD-10-CM | POA: Diagnosis not present

## 2023-05-24 DIAGNOSIS — D631 Anemia in chronic kidney disease: Secondary | ICD-10-CM | POA: Diagnosis not present

## 2023-05-24 DIAGNOSIS — N2581 Secondary hyperparathyroidism of renal origin: Secondary | ICD-10-CM | POA: Diagnosis not present

## 2023-05-24 DIAGNOSIS — N186 End stage renal disease: Secondary | ICD-10-CM | POA: Diagnosis not present

## 2023-05-24 DIAGNOSIS — Z992 Dependence on renal dialysis: Secondary | ICD-10-CM | POA: Diagnosis not present

## 2023-05-27 DIAGNOSIS — N2581 Secondary hyperparathyroidism of renal origin: Secondary | ICD-10-CM | POA: Diagnosis not present

## 2023-05-27 DIAGNOSIS — N186 End stage renal disease: Secondary | ICD-10-CM | POA: Diagnosis not present

## 2023-05-27 DIAGNOSIS — D631 Anemia in chronic kidney disease: Secondary | ICD-10-CM | POA: Diagnosis not present

## 2023-05-27 DIAGNOSIS — Z992 Dependence on renal dialysis: Secondary | ICD-10-CM | POA: Diagnosis not present

## 2023-05-28 DIAGNOSIS — I120 Hypertensive chronic kidney disease with stage 5 chronic kidney disease or end stage renal disease: Secondary | ICD-10-CM | POA: Diagnosis not present

## 2023-05-28 DIAGNOSIS — Z992 Dependence on renal dialysis: Secondary | ICD-10-CM | POA: Diagnosis not present

## 2023-05-28 DIAGNOSIS — E1142 Type 2 diabetes mellitus with diabetic polyneuropathy: Secondary | ICD-10-CM | POA: Diagnosis not present

## 2023-05-28 DIAGNOSIS — E1122 Type 2 diabetes mellitus with diabetic chronic kidney disease: Secondary | ICD-10-CM | POA: Diagnosis not present

## 2023-05-28 DIAGNOSIS — I69354 Hemiplegia and hemiparesis following cerebral infarction affecting left non-dominant side: Secondary | ICD-10-CM | POA: Diagnosis not present

## 2023-05-28 DIAGNOSIS — N186 End stage renal disease: Secondary | ICD-10-CM | POA: Diagnosis not present

## 2023-05-29 DIAGNOSIS — D631 Anemia in chronic kidney disease: Secondary | ICD-10-CM | POA: Diagnosis not present

## 2023-05-29 DIAGNOSIS — N2581 Secondary hyperparathyroidism of renal origin: Secondary | ICD-10-CM | POA: Diagnosis not present

## 2023-05-29 DIAGNOSIS — Z992 Dependence on renal dialysis: Secondary | ICD-10-CM | POA: Diagnosis not present

## 2023-05-29 DIAGNOSIS — N186 End stage renal disease: Secondary | ICD-10-CM | POA: Diagnosis not present

## 2023-05-30 DIAGNOSIS — E1122 Type 2 diabetes mellitus with diabetic chronic kidney disease: Secondary | ICD-10-CM | POA: Diagnosis not present

## 2023-05-30 DIAGNOSIS — T82858A Stenosis of vascular prosthetic devices, implants and grafts, initial encounter: Secondary | ICD-10-CM | POA: Diagnosis not present

## 2023-05-31 DIAGNOSIS — D631 Anemia in chronic kidney disease: Secondary | ICD-10-CM | POA: Diagnosis not present

## 2023-05-31 DIAGNOSIS — N2581 Secondary hyperparathyroidism of renal origin: Secondary | ICD-10-CM | POA: Diagnosis not present

## 2023-05-31 DIAGNOSIS — N186 End stage renal disease: Secondary | ICD-10-CM | POA: Diagnosis not present

## 2023-05-31 DIAGNOSIS — Z992 Dependence on renal dialysis: Secondary | ICD-10-CM | POA: Diagnosis not present

## 2023-06-03 DIAGNOSIS — N186 End stage renal disease: Secondary | ICD-10-CM | POA: Diagnosis not present

## 2023-06-03 DIAGNOSIS — N2581 Secondary hyperparathyroidism of renal origin: Secondary | ICD-10-CM | POA: Diagnosis not present

## 2023-06-03 DIAGNOSIS — D631 Anemia in chronic kidney disease: Secondary | ICD-10-CM | POA: Diagnosis not present

## 2023-06-03 DIAGNOSIS — Z992 Dependence on renal dialysis: Secondary | ICD-10-CM | POA: Diagnosis not present

## 2023-06-05 DIAGNOSIS — Z01812 Encounter for preprocedural laboratory examination: Secondary | ICD-10-CM | POA: Diagnosis not present

## 2023-06-05 DIAGNOSIS — Z992 Dependence on renal dialysis: Secondary | ICD-10-CM | POA: Diagnosis not present

## 2023-06-05 DIAGNOSIS — D631 Anemia in chronic kidney disease: Secondary | ICD-10-CM | POA: Diagnosis not present

## 2023-06-05 DIAGNOSIS — N186 End stage renal disease: Secondary | ICD-10-CM | POA: Diagnosis not present

## 2023-06-05 DIAGNOSIS — N2581 Secondary hyperparathyroidism of renal origin: Secondary | ICD-10-CM | POA: Diagnosis not present

## 2023-06-06 DIAGNOSIS — I120 Hypertensive chronic kidney disease with stage 5 chronic kidney disease or end stage renal disease: Secondary | ICD-10-CM | POA: Diagnosis not present

## 2023-06-06 DIAGNOSIS — I69354 Hemiplegia and hemiparesis following cerebral infarction affecting left non-dominant side: Secondary | ICD-10-CM | POA: Diagnosis not present

## 2023-06-06 DIAGNOSIS — E1142 Type 2 diabetes mellitus with diabetic polyneuropathy: Secondary | ICD-10-CM | POA: Diagnosis not present

## 2023-06-06 DIAGNOSIS — E1122 Type 2 diabetes mellitus with diabetic chronic kidney disease: Secondary | ICD-10-CM | POA: Diagnosis not present

## 2023-06-06 DIAGNOSIS — N186 End stage renal disease: Secondary | ICD-10-CM | POA: Diagnosis not present

## 2023-06-06 DIAGNOSIS — Z992 Dependence on renal dialysis: Secondary | ICD-10-CM | POA: Diagnosis not present

## 2023-06-07 DIAGNOSIS — Z992 Dependence on renal dialysis: Secondary | ICD-10-CM | POA: Diagnosis not present

## 2023-06-07 DIAGNOSIS — N2581 Secondary hyperparathyroidism of renal origin: Secondary | ICD-10-CM | POA: Diagnosis not present

## 2023-06-07 DIAGNOSIS — D631 Anemia in chronic kidney disease: Secondary | ICD-10-CM | POA: Diagnosis not present

## 2023-06-07 DIAGNOSIS — E1122 Type 2 diabetes mellitus with diabetic chronic kidney disease: Secondary | ICD-10-CM | POA: Diagnosis not present

## 2023-06-07 DIAGNOSIS — I120 Hypertensive chronic kidney disease with stage 5 chronic kidney disease or end stage renal disease: Secondary | ICD-10-CM | POA: Diagnosis not present

## 2023-06-07 DIAGNOSIS — N186 End stage renal disease: Secondary | ICD-10-CM | POA: Diagnosis not present

## 2023-06-10 DIAGNOSIS — Z992 Dependence on renal dialysis: Secondary | ICD-10-CM | POA: Diagnosis not present

## 2023-06-10 DIAGNOSIS — N2581 Secondary hyperparathyroidism of renal origin: Secondary | ICD-10-CM | POA: Diagnosis not present

## 2023-06-10 DIAGNOSIS — D631 Anemia in chronic kidney disease: Secondary | ICD-10-CM | POA: Diagnosis not present

## 2023-06-10 DIAGNOSIS — N186 End stage renal disease: Secondary | ICD-10-CM | POA: Diagnosis not present

## 2023-06-11 DIAGNOSIS — Z992 Dependence on renal dialysis: Secondary | ICD-10-CM | POA: Diagnosis not present

## 2023-06-11 DIAGNOSIS — N186 End stage renal disease: Secondary | ICD-10-CM | POA: Diagnosis not present

## 2023-06-14 DIAGNOSIS — N186 End stage renal disease: Secondary | ICD-10-CM | POA: Diagnosis not present

## 2023-06-14 DIAGNOSIS — D631 Anemia in chronic kidney disease: Secondary | ICD-10-CM | POA: Diagnosis not present

## 2023-06-14 DIAGNOSIS — Z992 Dependence on renal dialysis: Secondary | ICD-10-CM | POA: Diagnosis not present

## 2023-06-14 DIAGNOSIS — N2581 Secondary hyperparathyroidism of renal origin: Secondary | ICD-10-CM | POA: Diagnosis not present

## 2023-06-17 DIAGNOSIS — N2581 Secondary hyperparathyroidism of renal origin: Secondary | ICD-10-CM | POA: Diagnosis not present

## 2023-06-17 DIAGNOSIS — N186 End stage renal disease: Secondary | ICD-10-CM | POA: Diagnosis not present

## 2023-06-17 DIAGNOSIS — D631 Anemia in chronic kidney disease: Secondary | ICD-10-CM | POA: Diagnosis not present

## 2023-06-17 DIAGNOSIS — Z992 Dependence on renal dialysis: Secondary | ICD-10-CM | POA: Diagnosis not present

## 2023-06-18 DIAGNOSIS — E1169 Type 2 diabetes mellitus with other specified complication: Secondary | ICD-10-CM | POA: Diagnosis not present

## 2023-06-18 DIAGNOSIS — L0231 Cutaneous abscess of buttock: Secondary | ICD-10-CM | POA: Diagnosis not present

## 2023-06-18 DIAGNOSIS — I10 Essential (primary) hypertension: Secondary | ICD-10-CM | POA: Diagnosis not present

## 2023-06-18 DIAGNOSIS — G8194 Hemiplegia, unspecified affecting left nondominant side: Secondary | ICD-10-CM | POA: Diagnosis not present

## 2023-06-18 DIAGNOSIS — Z299 Encounter for prophylactic measures, unspecified: Secondary | ICD-10-CM | POA: Diagnosis not present

## 2023-06-18 DIAGNOSIS — N186 End stage renal disease: Secondary | ICD-10-CM | POA: Diagnosis not present

## 2023-06-19 DIAGNOSIS — N186 End stage renal disease: Secondary | ICD-10-CM | POA: Diagnosis not present

## 2023-06-19 DIAGNOSIS — D631 Anemia in chronic kidney disease: Secondary | ICD-10-CM | POA: Diagnosis not present

## 2023-06-19 DIAGNOSIS — Z992 Dependence on renal dialysis: Secondary | ICD-10-CM | POA: Diagnosis not present

## 2023-06-19 DIAGNOSIS — N2581 Secondary hyperparathyroidism of renal origin: Secondary | ICD-10-CM | POA: Diagnosis not present

## 2023-06-21 DIAGNOSIS — D631 Anemia in chronic kidney disease: Secondary | ICD-10-CM | POA: Diagnosis not present

## 2023-06-21 DIAGNOSIS — Z992 Dependence on renal dialysis: Secondary | ICD-10-CM | POA: Diagnosis not present

## 2023-06-21 DIAGNOSIS — N186 End stage renal disease: Secondary | ICD-10-CM | POA: Diagnosis not present

## 2023-06-21 DIAGNOSIS — N2581 Secondary hyperparathyroidism of renal origin: Secondary | ICD-10-CM | POA: Diagnosis not present

## 2023-06-24 DIAGNOSIS — Z992 Dependence on renal dialysis: Secondary | ICD-10-CM | POA: Diagnosis not present

## 2023-06-24 DIAGNOSIS — N186 End stage renal disease: Secondary | ICD-10-CM | POA: Diagnosis not present

## 2023-06-24 DIAGNOSIS — N2581 Secondary hyperparathyroidism of renal origin: Secondary | ICD-10-CM | POA: Diagnosis not present

## 2023-06-24 DIAGNOSIS — D631 Anemia in chronic kidney disease: Secondary | ICD-10-CM | POA: Diagnosis not present

## 2023-06-26 DIAGNOSIS — N2581 Secondary hyperparathyroidism of renal origin: Secondary | ICD-10-CM | POA: Diagnosis not present

## 2023-06-26 DIAGNOSIS — N186 End stage renal disease: Secondary | ICD-10-CM | POA: Diagnosis not present

## 2023-06-26 DIAGNOSIS — Z992 Dependence on renal dialysis: Secondary | ICD-10-CM | POA: Diagnosis not present

## 2023-06-26 DIAGNOSIS — D631 Anemia in chronic kidney disease: Secondary | ICD-10-CM | POA: Diagnosis not present

## 2023-06-28 DIAGNOSIS — N2581 Secondary hyperparathyroidism of renal origin: Secondary | ICD-10-CM | POA: Diagnosis not present

## 2023-06-28 DIAGNOSIS — Z992 Dependence on renal dialysis: Secondary | ICD-10-CM | POA: Diagnosis not present

## 2023-06-28 DIAGNOSIS — N186 End stage renal disease: Secondary | ICD-10-CM | POA: Diagnosis not present

## 2023-06-28 DIAGNOSIS — D631 Anemia in chronic kidney disease: Secondary | ICD-10-CM | POA: Diagnosis not present

## 2023-07-01 DIAGNOSIS — Z992 Dependence on renal dialysis: Secondary | ICD-10-CM | POA: Diagnosis not present

## 2023-07-01 DIAGNOSIS — D631 Anemia in chronic kidney disease: Secondary | ICD-10-CM | POA: Diagnosis not present

## 2023-07-01 DIAGNOSIS — N2581 Secondary hyperparathyroidism of renal origin: Secondary | ICD-10-CM | POA: Diagnosis not present

## 2023-07-01 DIAGNOSIS — N186 End stage renal disease: Secondary | ICD-10-CM | POA: Diagnosis not present

## 2023-07-02 DIAGNOSIS — Z992 Dependence on renal dialysis: Secondary | ICD-10-CM | POA: Diagnosis not present

## 2023-07-02 DIAGNOSIS — G8194 Hemiplegia, unspecified affecting left nondominant side: Secondary | ICD-10-CM | POA: Diagnosis not present

## 2023-07-02 DIAGNOSIS — L0231 Cutaneous abscess of buttock: Secondary | ICD-10-CM | POA: Diagnosis not present

## 2023-07-02 DIAGNOSIS — Z299 Encounter for prophylactic measures, unspecified: Secondary | ICD-10-CM | POA: Diagnosis not present

## 2023-07-02 DIAGNOSIS — I10 Essential (primary) hypertension: Secondary | ICD-10-CM | POA: Diagnosis not present

## 2023-07-02 DIAGNOSIS — E1169 Type 2 diabetes mellitus with other specified complication: Secondary | ICD-10-CM | POA: Diagnosis not present

## 2023-07-02 DIAGNOSIS — J61 Pneumoconiosis due to asbestos and other mineral fibers: Secondary | ICD-10-CM | POA: Diagnosis not present

## 2023-07-03 DIAGNOSIS — N2581 Secondary hyperparathyroidism of renal origin: Secondary | ICD-10-CM | POA: Diagnosis not present

## 2023-07-03 DIAGNOSIS — D631 Anemia in chronic kidney disease: Secondary | ICD-10-CM | POA: Diagnosis not present

## 2023-07-03 DIAGNOSIS — Z992 Dependence on renal dialysis: Secondary | ICD-10-CM | POA: Diagnosis not present

## 2023-07-03 DIAGNOSIS — N186 End stage renal disease: Secondary | ICD-10-CM | POA: Diagnosis not present

## 2023-07-05 DIAGNOSIS — D631 Anemia in chronic kidney disease: Secondary | ICD-10-CM | POA: Diagnosis not present

## 2023-07-05 DIAGNOSIS — N186 End stage renal disease: Secondary | ICD-10-CM | POA: Diagnosis not present

## 2023-07-05 DIAGNOSIS — Z992 Dependence on renal dialysis: Secondary | ICD-10-CM | POA: Diagnosis not present

## 2023-07-05 DIAGNOSIS — N2581 Secondary hyperparathyroidism of renal origin: Secondary | ICD-10-CM | POA: Diagnosis not present

## 2023-07-08 DIAGNOSIS — N2581 Secondary hyperparathyroidism of renal origin: Secondary | ICD-10-CM | POA: Diagnosis not present

## 2023-07-08 DIAGNOSIS — N186 End stage renal disease: Secondary | ICD-10-CM | POA: Diagnosis not present

## 2023-07-08 DIAGNOSIS — D631 Anemia in chronic kidney disease: Secondary | ICD-10-CM | POA: Diagnosis not present

## 2023-07-08 DIAGNOSIS — Z992 Dependence on renal dialysis: Secondary | ICD-10-CM | POA: Diagnosis not present

## 2023-07-10 DIAGNOSIS — Z992 Dependence on renal dialysis: Secondary | ICD-10-CM | POA: Diagnosis not present

## 2023-07-10 DIAGNOSIS — N2581 Secondary hyperparathyroidism of renal origin: Secondary | ICD-10-CM | POA: Diagnosis not present

## 2023-07-10 DIAGNOSIS — N186 End stage renal disease: Secondary | ICD-10-CM | POA: Diagnosis not present

## 2023-07-10 DIAGNOSIS — D631 Anemia in chronic kidney disease: Secondary | ICD-10-CM | POA: Diagnosis not present

## 2023-07-12 DIAGNOSIS — D631 Anemia in chronic kidney disease: Secondary | ICD-10-CM | POA: Diagnosis not present

## 2023-07-12 DIAGNOSIS — N2581 Secondary hyperparathyroidism of renal origin: Secondary | ICD-10-CM | POA: Diagnosis not present

## 2023-07-12 DIAGNOSIS — N186 End stage renal disease: Secondary | ICD-10-CM | POA: Diagnosis not present

## 2023-07-12 DIAGNOSIS — Z992 Dependence on renal dialysis: Secondary | ICD-10-CM | POA: Diagnosis not present

## 2023-07-15 DIAGNOSIS — N2581 Secondary hyperparathyroidism of renal origin: Secondary | ICD-10-CM | POA: Diagnosis not present

## 2023-07-15 DIAGNOSIS — Z992 Dependence on renal dialysis: Secondary | ICD-10-CM | POA: Diagnosis not present

## 2023-07-15 DIAGNOSIS — N186 End stage renal disease: Secondary | ICD-10-CM | POA: Diagnosis not present

## 2023-07-15 DIAGNOSIS — D631 Anemia in chronic kidney disease: Secondary | ICD-10-CM | POA: Diagnosis not present

## 2023-07-17 DIAGNOSIS — D631 Anemia in chronic kidney disease: Secondary | ICD-10-CM | POA: Diagnosis not present

## 2023-07-17 DIAGNOSIS — N186 End stage renal disease: Secondary | ICD-10-CM | POA: Diagnosis not present

## 2023-07-17 DIAGNOSIS — N2581 Secondary hyperparathyroidism of renal origin: Secondary | ICD-10-CM | POA: Diagnosis not present

## 2023-07-17 DIAGNOSIS — Z992 Dependence on renal dialysis: Secondary | ICD-10-CM | POA: Diagnosis not present

## 2023-07-19 DIAGNOSIS — N186 End stage renal disease: Secondary | ICD-10-CM | POA: Diagnosis not present

## 2023-07-19 DIAGNOSIS — D631 Anemia in chronic kidney disease: Secondary | ICD-10-CM | POA: Diagnosis not present

## 2023-07-19 DIAGNOSIS — N2581 Secondary hyperparathyroidism of renal origin: Secondary | ICD-10-CM | POA: Diagnosis not present

## 2023-07-19 DIAGNOSIS — Z992 Dependence on renal dialysis: Secondary | ICD-10-CM | POA: Diagnosis not present

## 2023-07-22 DIAGNOSIS — D631 Anemia in chronic kidney disease: Secondary | ICD-10-CM | POA: Diagnosis not present

## 2023-07-22 DIAGNOSIS — N2581 Secondary hyperparathyroidism of renal origin: Secondary | ICD-10-CM | POA: Diagnosis not present

## 2023-07-22 DIAGNOSIS — N186 End stage renal disease: Secondary | ICD-10-CM | POA: Diagnosis not present

## 2023-07-22 DIAGNOSIS — Z992 Dependence on renal dialysis: Secondary | ICD-10-CM | POA: Diagnosis not present

## 2023-07-24 DIAGNOSIS — D631 Anemia in chronic kidney disease: Secondary | ICD-10-CM | POA: Diagnosis not present

## 2023-07-24 DIAGNOSIS — N2581 Secondary hyperparathyroidism of renal origin: Secondary | ICD-10-CM | POA: Diagnosis not present

## 2023-07-24 DIAGNOSIS — N186 End stage renal disease: Secondary | ICD-10-CM | POA: Diagnosis not present

## 2023-07-24 DIAGNOSIS — Z992 Dependence on renal dialysis: Secondary | ICD-10-CM | POA: Diagnosis not present

## 2023-07-26 DIAGNOSIS — N186 End stage renal disease: Secondary | ICD-10-CM | POA: Diagnosis not present

## 2023-07-26 DIAGNOSIS — Z992 Dependence on renal dialysis: Secondary | ICD-10-CM | POA: Diagnosis not present

## 2023-07-26 DIAGNOSIS — D631 Anemia in chronic kidney disease: Secondary | ICD-10-CM | POA: Diagnosis not present

## 2023-07-26 DIAGNOSIS — N2581 Secondary hyperparathyroidism of renal origin: Secondary | ICD-10-CM | POA: Diagnosis not present

## 2023-07-29 DIAGNOSIS — N186 End stage renal disease: Secondary | ICD-10-CM | POA: Diagnosis not present

## 2023-07-29 DIAGNOSIS — Z992 Dependence on renal dialysis: Secondary | ICD-10-CM | POA: Diagnosis not present

## 2023-07-29 DIAGNOSIS — N2581 Secondary hyperparathyroidism of renal origin: Secondary | ICD-10-CM | POA: Diagnosis not present

## 2023-07-29 DIAGNOSIS — D631 Anemia in chronic kidney disease: Secondary | ICD-10-CM | POA: Diagnosis not present

## 2023-07-31 DIAGNOSIS — N2581 Secondary hyperparathyroidism of renal origin: Secondary | ICD-10-CM | POA: Diagnosis not present

## 2023-07-31 DIAGNOSIS — D631 Anemia in chronic kidney disease: Secondary | ICD-10-CM | POA: Diagnosis not present

## 2023-07-31 DIAGNOSIS — Z992 Dependence on renal dialysis: Secondary | ICD-10-CM | POA: Diagnosis not present

## 2023-07-31 DIAGNOSIS — N186 End stage renal disease: Secondary | ICD-10-CM | POA: Diagnosis not present

## 2023-08-02 DIAGNOSIS — D631 Anemia in chronic kidney disease: Secondary | ICD-10-CM | POA: Diagnosis not present

## 2023-08-02 DIAGNOSIS — N2581 Secondary hyperparathyroidism of renal origin: Secondary | ICD-10-CM | POA: Diagnosis not present

## 2023-08-02 DIAGNOSIS — D509 Iron deficiency anemia, unspecified: Secondary | ICD-10-CM | POA: Diagnosis not present

## 2023-08-02 DIAGNOSIS — Z992 Dependence on renal dialysis: Secondary | ICD-10-CM | POA: Diagnosis not present

## 2023-08-02 DIAGNOSIS — N186 End stage renal disease: Secondary | ICD-10-CM | POA: Diagnosis not present

## 2023-08-06 DIAGNOSIS — D631 Anemia in chronic kidney disease: Secondary | ICD-10-CM | POA: Diagnosis not present

## 2023-08-06 DIAGNOSIS — N186 End stage renal disease: Secondary | ICD-10-CM | POA: Diagnosis not present

## 2023-08-06 DIAGNOSIS — D509 Iron deficiency anemia, unspecified: Secondary | ICD-10-CM | POA: Diagnosis not present

## 2023-08-06 DIAGNOSIS — I129 Hypertensive chronic kidney disease with stage 1 through stage 4 chronic kidney disease, or unspecified chronic kidney disease: Secondary | ICD-10-CM | POA: Diagnosis not present

## 2023-08-06 DIAGNOSIS — Z01812 Encounter for preprocedural laboratory examination: Secondary | ICD-10-CM | POA: Diagnosis not present

## 2023-08-06 DIAGNOSIS — Z992 Dependence on renal dialysis: Secondary | ICD-10-CM | POA: Diagnosis not present

## 2023-08-06 DIAGNOSIS — N2581 Secondary hyperparathyroidism of renal origin: Secondary | ICD-10-CM | POA: Diagnosis not present

## 2023-08-07 DIAGNOSIS — D509 Iron deficiency anemia, unspecified: Secondary | ICD-10-CM | POA: Diagnosis not present

## 2023-08-07 DIAGNOSIS — Z992 Dependence on renal dialysis: Secondary | ICD-10-CM | POA: Diagnosis not present

## 2023-08-07 DIAGNOSIS — N186 End stage renal disease: Secondary | ICD-10-CM | POA: Diagnosis not present

## 2023-08-07 DIAGNOSIS — D631 Anemia in chronic kidney disease: Secondary | ICD-10-CM | POA: Diagnosis not present

## 2023-08-07 DIAGNOSIS — N2581 Secondary hyperparathyroidism of renal origin: Secondary | ICD-10-CM | POA: Diagnosis not present

## 2023-08-09 DIAGNOSIS — N2581 Secondary hyperparathyroidism of renal origin: Secondary | ICD-10-CM | POA: Diagnosis not present

## 2023-08-09 DIAGNOSIS — Z992 Dependence on renal dialysis: Secondary | ICD-10-CM | POA: Diagnosis not present

## 2023-08-09 DIAGNOSIS — D509 Iron deficiency anemia, unspecified: Secondary | ICD-10-CM | POA: Diagnosis not present

## 2023-08-09 DIAGNOSIS — N186 End stage renal disease: Secondary | ICD-10-CM | POA: Diagnosis not present

## 2023-08-09 DIAGNOSIS — D631 Anemia in chronic kidney disease: Secondary | ICD-10-CM | POA: Diagnosis not present

## 2023-08-12 DIAGNOSIS — D509 Iron deficiency anemia, unspecified: Secondary | ICD-10-CM | POA: Diagnosis not present

## 2023-08-12 DIAGNOSIS — N2581 Secondary hyperparathyroidism of renal origin: Secondary | ICD-10-CM | POA: Diagnosis not present

## 2023-08-12 DIAGNOSIS — N186 End stage renal disease: Secondary | ICD-10-CM | POA: Diagnosis not present

## 2023-08-12 DIAGNOSIS — Z992 Dependence on renal dialysis: Secondary | ICD-10-CM | POA: Diagnosis not present

## 2023-08-12 DIAGNOSIS — D631 Anemia in chronic kidney disease: Secondary | ICD-10-CM | POA: Diagnosis not present

## 2023-08-13 DIAGNOSIS — N2581 Secondary hyperparathyroidism of renal origin: Secondary | ICD-10-CM | POA: Diagnosis not present

## 2023-08-13 DIAGNOSIS — Z992 Dependence on renal dialysis: Secondary | ICD-10-CM | POA: Diagnosis not present

## 2023-08-13 DIAGNOSIS — N186 End stage renal disease: Secondary | ICD-10-CM | POA: Diagnosis not present

## 2023-08-14 DIAGNOSIS — B351 Tinea unguium: Secondary | ICD-10-CM | POA: Diagnosis not present

## 2023-08-14 DIAGNOSIS — R2689 Other abnormalities of gait and mobility: Secondary | ICD-10-CM | POA: Diagnosis not present

## 2023-08-14 DIAGNOSIS — Z992 Dependence on renal dialysis: Secondary | ICD-10-CM | POA: Diagnosis not present

## 2023-08-14 DIAGNOSIS — G8918 Other acute postprocedural pain: Secondary | ICD-10-CM | POA: Diagnosis not present

## 2023-08-14 DIAGNOSIS — T82898A Other specified complication of vascular prosthetic devices, implants and grafts, initial encounter: Secondary | ICD-10-CM | POA: Diagnosis not present

## 2023-08-14 DIAGNOSIS — I739 Peripheral vascular disease, unspecified: Secondary | ICD-10-CM | POA: Diagnosis not present

## 2023-08-14 DIAGNOSIS — Z794 Long term (current) use of insulin: Secondary | ICD-10-CM | POA: Diagnosis not present

## 2023-08-14 DIAGNOSIS — N186 End stage renal disease: Secondary | ICD-10-CM | POA: Diagnosis not present

## 2023-08-14 DIAGNOSIS — Z993 Dependence on wheelchair: Secondary | ICD-10-CM | POA: Diagnosis not present

## 2023-08-14 DIAGNOSIS — Z79899 Other long term (current) drug therapy: Secondary | ICD-10-CM | POA: Diagnosis not present

## 2023-08-14 DIAGNOSIS — I120 Hypertensive chronic kidney disease with stage 5 chronic kidney disease or end stage renal disease: Secondary | ICD-10-CM | POA: Diagnosis not present

## 2023-08-14 DIAGNOSIS — E1142 Type 2 diabetes mellitus with diabetic polyneuropathy: Secondary | ICD-10-CM | POA: Diagnosis not present

## 2023-08-14 DIAGNOSIS — D688 Other specified coagulation defects: Secondary | ICD-10-CM | POA: Diagnosis not present

## 2023-08-14 DIAGNOSIS — Z8673 Personal history of transient ischemic attack (TIA), and cerebral infarction without residual deficits: Secondary | ICD-10-CM | POA: Diagnosis not present

## 2023-08-14 DIAGNOSIS — E1122 Type 2 diabetes mellitus with diabetic chronic kidney disease: Secondary | ICD-10-CM | POA: Diagnosis not present

## 2023-08-14 DIAGNOSIS — E7800 Pure hypercholesterolemia, unspecified: Secondary | ICD-10-CM | POA: Diagnosis not present

## 2023-08-14 DIAGNOSIS — Z7902 Long term (current) use of antithrombotics/antiplatelets: Secondary | ICD-10-CM | POA: Diagnosis not present

## 2023-08-16 DIAGNOSIS — D631 Anemia in chronic kidney disease: Secondary | ICD-10-CM | POA: Diagnosis not present

## 2023-08-16 DIAGNOSIS — D509 Iron deficiency anemia, unspecified: Secondary | ICD-10-CM | POA: Diagnosis not present

## 2023-08-16 DIAGNOSIS — N186 End stage renal disease: Secondary | ICD-10-CM | POA: Diagnosis not present

## 2023-08-16 DIAGNOSIS — Z992 Dependence on renal dialysis: Secondary | ICD-10-CM | POA: Diagnosis not present

## 2023-08-16 DIAGNOSIS — N2581 Secondary hyperparathyroidism of renal origin: Secondary | ICD-10-CM | POA: Diagnosis not present

## 2023-08-19 DIAGNOSIS — N2581 Secondary hyperparathyroidism of renal origin: Secondary | ICD-10-CM | POA: Diagnosis not present

## 2023-08-19 DIAGNOSIS — D509 Iron deficiency anemia, unspecified: Secondary | ICD-10-CM | POA: Diagnosis not present

## 2023-08-19 DIAGNOSIS — D631 Anemia in chronic kidney disease: Secondary | ICD-10-CM | POA: Diagnosis not present

## 2023-08-19 DIAGNOSIS — N186 End stage renal disease: Secondary | ICD-10-CM | POA: Diagnosis not present

## 2023-08-19 DIAGNOSIS — Z992 Dependence on renal dialysis: Secondary | ICD-10-CM | POA: Diagnosis not present

## 2023-08-20 DIAGNOSIS — E1122 Type 2 diabetes mellitus with diabetic chronic kidney disease: Secondary | ICD-10-CM | POA: Diagnosis not present

## 2023-08-20 DIAGNOSIS — T82858A Stenosis of vascular prosthetic devices, implants and grafts, initial encounter: Secondary | ICD-10-CM | POA: Diagnosis not present

## 2023-08-21 DIAGNOSIS — N2581 Secondary hyperparathyroidism of renal origin: Secondary | ICD-10-CM | POA: Diagnosis not present

## 2023-08-21 DIAGNOSIS — D631 Anemia in chronic kidney disease: Secondary | ICD-10-CM | POA: Diagnosis not present

## 2023-08-21 DIAGNOSIS — D509 Iron deficiency anemia, unspecified: Secondary | ICD-10-CM | POA: Diagnosis not present

## 2023-08-21 DIAGNOSIS — Z992 Dependence on renal dialysis: Secondary | ICD-10-CM | POA: Diagnosis not present

## 2023-08-21 DIAGNOSIS — N186 End stage renal disease: Secondary | ICD-10-CM | POA: Diagnosis not present

## 2023-08-22 DIAGNOSIS — M79671 Pain in right foot: Secondary | ICD-10-CM | POA: Diagnosis not present

## 2023-08-22 DIAGNOSIS — L600 Ingrowing nail: Secondary | ICD-10-CM | POA: Diagnosis not present

## 2023-08-22 DIAGNOSIS — Z992 Dependence on renal dialysis: Secondary | ICD-10-CM | POA: Diagnosis not present

## 2023-08-23 DIAGNOSIS — N186 End stage renal disease: Secondary | ICD-10-CM | POA: Diagnosis not present

## 2023-08-23 DIAGNOSIS — N2581 Secondary hyperparathyroidism of renal origin: Secondary | ICD-10-CM | POA: Diagnosis not present

## 2023-08-23 DIAGNOSIS — D631 Anemia in chronic kidney disease: Secondary | ICD-10-CM | POA: Diagnosis not present

## 2023-08-23 DIAGNOSIS — Z992 Dependence on renal dialysis: Secondary | ICD-10-CM | POA: Diagnosis not present

## 2023-08-23 DIAGNOSIS — D509 Iron deficiency anemia, unspecified: Secondary | ICD-10-CM | POA: Diagnosis not present

## 2023-08-26 DIAGNOSIS — D509 Iron deficiency anemia, unspecified: Secondary | ICD-10-CM | POA: Diagnosis not present

## 2023-08-26 DIAGNOSIS — N2581 Secondary hyperparathyroidism of renal origin: Secondary | ICD-10-CM | POA: Diagnosis not present

## 2023-08-26 DIAGNOSIS — N186 End stage renal disease: Secondary | ICD-10-CM | POA: Diagnosis not present

## 2023-08-26 DIAGNOSIS — Z992 Dependence on renal dialysis: Secondary | ICD-10-CM | POA: Diagnosis not present

## 2023-08-26 DIAGNOSIS — D631 Anemia in chronic kidney disease: Secondary | ICD-10-CM | POA: Diagnosis not present

## 2023-08-28 DIAGNOSIS — Z992 Dependence on renal dialysis: Secondary | ICD-10-CM | POA: Diagnosis not present

## 2023-08-28 DIAGNOSIS — D509 Iron deficiency anemia, unspecified: Secondary | ICD-10-CM | POA: Diagnosis not present

## 2023-08-28 DIAGNOSIS — N2581 Secondary hyperparathyroidism of renal origin: Secondary | ICD-10-CM | POA: Diagnosis not present

## 2023-08-28 DIAGNOSIS — N186 End stage renal disease: Secondary | ICD-10-CM | POA: Diagnosis not present

## 2023-08-28 DIAGNOSIS — D631 Anemia in chronic kidney disease: Secondary | ICD-10-CM | POA: Diagnosis not present

## 2023-08-30 DIAGNOSIS — N2581 Secondary hyperparathyroidism of renal origin: Secondary | ICD-10-CM | POA: Diagnosis not present

## 2023-08-30 DIAGNOSIS — N186 End stage renal disease: Secondary | ICD-10-CM | POA: Diagnosis not present

## 2023-08-30 DIAGNOSIS — Z992 Dependence on renal dialysis: Secondary | ICD-10-CM | POA: Diagnosis not present

## 2023-08-30 DIAGNOSIS — D631 Anemia in chronic kidney disease: Secondary | ICD-10-CM | POA: Diagnosis not present

## 2023-08-30 DIAGNOSIS — D509 Iron deficiency anemia, unspecified: Secondary | ICD-10-CM | POA: Diagnosis not present

## 2023-09-01 DIAGNOSIS — N186 End stage renal disease: Secondary | ICD-10-CM | POA: Diagnosis not present

## 2023-09-01 DIAGNOSIS — Z992 Dependence on renal dialysis: Secondary | ICD-10-CM | POA: Diagnosis not present

## 2023-09-02 DIAGNOSIS — D631 Anemia in chronic kidney disease: Secondary | ICD-10-CM | POA: Diagnosis not present

## 2023-09-02 DIAGNOSIS — Z992 Dependence on renal dialysis: Secondary | ICD-10-CM | POA: Diagnosis not present

## 2023-09-02 DIAGNOSIS — N2581 Secondary hyperparathyroidism of renal origin: Secondary | ICD-10-CM | POA: Diagnosis not present

## 2023-09-02 DIAGNOSIS — N186 End stage renal disease: Secondary | ICD-10-CM | POA: Diagnosis not present

## 2023-09-04 DIAGNOSIS — N186 End stage renal disease: Secondary | ICD-10-CM | POA: Diagnosis not present

## 2023-09-04 DIAGNOSIS — N2581 Secondary hyperparathyroidism of renal origin: Secondary | ICD-10-CM | POA: Diagnosis not present

## 2023-09-04 DIAGNOSIS — D631 Anemia in chronic kidney disease: Secondary | ICD-10-CM | POA: Diagnosis not present

## 2023-09-04 DIAGNOSIS — Z992 Dependence on renal dialysis: Secondary | ICD-10-CM | POA: Diagnosis not present

## 2023-09-06 DIAGNOSIS — Z992 Dependence on renal dialysis: Secondary | ICD-10-CM | POA: Diagnosis not present

## 2023-09-06 DIAGNOSIS — N2581 Secondary hyperparathyroidism of renal origin: Secondary | ICD-10-CM | POA: Diagnosis not present

## 2023-09-06 DIAGNOSIS — N186 End stage renal disease: Secondary | ICD-10-CM | POA: Diagnosis not present

## 2023-09-06 DIAGNOSIS — D631 Anemia in chronic kidney disease: Secondary | ICD-10-CM | POA: Diagnosis not present

## 2023-09-09 DIAGNOSIS — Z992 Dependence on renal dialysis: Secondary | ICD-10-CM | POA: Diagnosis not present

## 2023-09-09 DIAGNOSIS — N186 End stage renal disease: Secondary | ICD-10-CM | POA: Diagnosis not present

## 2023-09-09 DIAGNOSIS — N2581 Secondary hyperparathyroidism of renal origin: Secondary | ICD-10-CM | POA: Diagnosis not present

## 2023-09-09 DIAGNOSIS — D631 Anemia in chronic kidney disease: Secondary | ICD-10-CM | POA: Diagnosis not present

## 2023-09-11 DIAGNOSIS — Z992 Dependence on renal dialysis: Secondary | ICD-10-CM | POA: Diagnosis not present

## 2023-09-11 DIAGNOSIS — N2581 Secondary hyperparathyroidism of renal origin: Secondary | ICD-10-CM | POA: Diagnosis not present

## 2023-09-11 DIAGNOSIS — D631 Anemia in chronic kidney disease: Secondary | ICD-10-CM | POA: Diagnosis not present

## 2023-09-11 DIAGNOSIS — N186 End stage renal disease: Secondary | ICD-10-CM | POA: Diagnosis not present

## 2023-09-13 DIAGNOSIS — N2581 Secondary hyperparathyroidism of renal origin: Secondary | ICD-10-CM | POA: Diagnosis not present

## 2023-09-13 DIAGNOSIS — D631 Anemia in chronic kidney disease: Secondary | ICD-10-CM | POA: Diagnosis not present

## 2023-09-13 DIAGNOSIS — N186 End stage renal disease: Secondary | ICD-10-CM | POA: Diagnosis not present

## 2023-09-13 DIAGNOSIS — Z992 Dependence on renal dialysis: Secondary | ICD-10-CM | POA: Diagnosis not present

## 2023-09-16 DIAGNOSIS — N186 End stage renal disease: Secondary | ICD-10-CM | POA: Diagnosis not present

## 2023-09-16 DIAGNOSIS — D631 Anemia in chronic kidney disease: Secondary | ICD-10-CM | POA: Diagnosis not present

## 2023-09-16 DIAGNOSIS — Z992 Dependence on renal dialysis: Secondary | ICD-10-CM | POA: Diagnosis not present

## 2023-09-16 DIAGNOSIS — N2581 Secondary hyperparathyroidism of renal origin: Secondary | ICD-10-CM | POA: Diagnosis not present

## 2023-09-18 DIAGNOSIS — Z992 Dependence on renal dialysis: Secondary | ICD-10-CM | POA: Diagnosis not present

## 2023-09-18 DIAGNOSIS — D631 Anemia in chronic kidney disease: Secondary | ICD-10-CM | POA: Diagnosis not present

## 2023-09-18 DIAGNOSIS — N186 End stage renal disease: Secondary | ICD-10-CM | POA: Diagnosis not present

## 2023-09-18 DIAGNOSIS — N2581 Secondary hyperparathyroidism of renal origin: Secondary | ICD-10-CM | POA: Diagnosis not present

## 2023-09-20 DIAGNOSIS — Z992 Dependence on renal dialysis: Secondary | ICD-10-CM | POA: Diagnosis not present

## 2023-09-20 DIAGNOSIS — N186 End stage renal disease: Secondary | ICD-10-CM | POA: Diagnosis not present

## 2023-09-20 DIAGNOSIS — N2581 Secondary hyperparathyroidism of renal origin: Secondary | ICD-10-CM | POA: Diagnosis not present

## 2023-09-20 DIAGNOSIS — D631 Anemia in chronic kidney disease: Secondary | ICD-10-CM | POA: Diagnosis not present

## 2023-09-23 DIAGNOSIS — N186 End stage renal disease: Secondary | ICD-10-CM | POA: Diagnosis not present

## 2023-09-23 DIAGNOSIS — Z992 Dependence on renal dialysis: Secondary | ICD-10-CM | POA: Diagnosis not present

## 2023-09-23 DIAGNOSIS — D631 Anemia in chronic kidney disease: Secondary | ICD-10-CM | POA: Diagnosis not present

## 2023-09-23 DIAGNOSIS — N2581 Secondary hyperparathyroidism of renal origin: Secondary | ICD-10-CM | POA: Diagnosis not present

## 2023-09-25 DIAGNOSIS — Z992 Dependence on renal dialysis: Secondary | ICD-10-CM | POA: Diagnosis not present

## 2023-09-25 DIAGNOSIS — N2581 Secondary hyperparathyroidism of renal origin: Secondary | ICD-10-CM | POA: Diagnosis not present

## 2023-09-25 DIAGNOSIS — N186 End stage renal disease: Secondary | ICD-10-CM | POA: Diagnosis not present

## 2023-09-25 DIAGNOSIS — D631 Anemia in chronic kidney disease: Secondary | ICD-10-CM | POA: Diagnosis not present

## 2023-09-27 DIAGNOSIS — D631 Anemia in chronic kidney disease: Secondary | ICD-10-CM | POA: Diagnosis not present

## 2023-09-27 DIAGNOSIS — N2581 Secondary hyperparathyroidism of renal origin: Secondary | ICD-10-CM | POA: Diagnosis not present

## 2023-09-27 DIAGNOSIS — N186 End stage renal disease: Secondary | ICD-10-CM | POA: Diagnosis not present

## 2023-09-27 DIAGNOSIS — Z992 Dependence on renal dialysis: Secondary | ICD-10-CM | POA: Diagnosis not present

## 2023-09-30 DIAGNOSIS — N2581 Secondary hyperparathyroidism of renal origin: Secondary | ICD-10-CM | POA: Diagnosis not present

## 2023-09-30 DIAGNOSIS — Z992 Dependence on renal dialysis: Secondary | ICD-10-CM | POA: Diagnosis not present

## 2023-09-30 DIAGNOSIS — D631 Anemia in chronic kidney disease: Secondary | ICD-10-CM | POA: Diagnosis not present

## 2023-09-30 DIAGNOSIS — N186 End stage renal disease: Secondary | ICD-10-CM | POA: Diagnosis not present

## 2023-10-02 DIAGNOSIS — D500 Iron deficiency anemia secondary to blood loss (chronic): Secondary | ICD-10-CM | POA: Diagnosis not present

## 2023-10-02 DIAGNOSIS — Z992 Dependence on renal dialysis: Secondary | ICD-10-CM | POA: Diagnosis not present

## 2023-10-02 DIAGNOSIS — N2581 Secondary hyperparathyroidism of renal origin: Secondary | ICD-10-CM | POA: Diagnosis not present

## 2023-10-02 DIAGNOSIS — N186 End stage renal disease: Secondary | ICD-10-CM | POA: Diagnosis not present

## 2023-10-04 DIAGNOSIS — Z992 Dependence on renal dialysis: Secondary | ICD-10-CM | POA: Diagnosis not present

## 2023-10-04 DIAGNOSIS — N186 End stage renal disease: Secondary | ICD-10-CM | POA: Diagnosis not present

## 2023-10-04 DIAGNOSIS — D500 Iron deficiency anemia secondary to blood loss (chronic): Secondary | ICD-10-CM | POA: Diagnosis not present

## 2023-10-04 DIAGNOSIS — N2581 Secondary hyperparathyroidism of renal origin: Secondary | ICD-10-CM | POA: Diagnosis not present

## 2023-10-07 DIAGNOSIS — N2581 Secondary hyperparathyroidism of renal origin: Secondary | ICD-10-CM | POA: Diagnosis not present

## 2023-10-07 DIAGNOSIS — Z992 Dependence on renal dialysis: Secondary | ICD-10-CM | POA: Diagnosis not present

## 2023-10-07 DIAGNOSIS — D500 Iron deficiency anemia secondary to blood loss (chronic): Secondary | ICD-10-CM | POA: Diagnosis not present

## 2023-10-07 DIAGNOSIS — N186 End stage renal disease: Secondary | ICD-10-CM | POA: Diagnosis not present

## 2023-10-09 DIAGNOSIS — N2581 Secondary hyperparathyroidism of renal origin: Secondary | ICD-10-CM | POA: Diagnosis not present

## 2023-10-09 DIAGNOSIS — D500 Iron deficiency anemia secondary to blood loss (chronic): Secondary | ICD-10-CM | POA: Diagnosis not present

## 2023-10-09 DIAGNOSIS — N186 End stage renal disease: Secondary | ICD-10-CM | POA: Diagnosis not present

## 2023-10-09 DIAGNOSIS — Z992 Dependence on renal dialysis: Secondary | ICD-10-CM | POA: Diagnosis not present

## 2023-10-11 DIAGNOSIS — D500 Iron deficiency anemia secondary to blood loss (chronic): Secondary | ICD-10-CM | POA: Diagnosis not present

## 2023-10-11 DIAGNOSIS — Z992 Dependence on renal dialysis: Secondary | ICD-10-CM | POA: Diagnosis not present

## 2023-10-11 DIAGNOSIS — N2581 Secondary hyperparathyroidism of renal origin: Secondary | ICD-10-CM | POA: Diagnosis not present

## 2023-10-11 DIAGNOSIS — N186 End stage renal disease: Secondary | ICD-10-CM | POA: Diagnosis not present

## 2023-10-14 DIAGNOSIS — D500 Iron deficiency anemia secondary to blood loss (chronic): Secondary | ICD-10-CM | POA: Diagnosis not present

## 2023-10-14 DIAGNOSIS — Z992 Dependence on renal dialysis: Secondary | ICD-10-CM | POA: Diagnosis not present

## 2023-10-14 DIAGNOSIS — N2581 Secondary hyperparathyroidism of renal origin: Secondary | ICD-10-CM | POA: Diagnosis not present

## 2023-10-14 DIAGNOSIS — N186 End stage renal disease: Secondary | ICD-10-CM | POA: Diagnosis not present

## 2023-10-15 DIAGNOSIS — I10 Essential (primary) hypertension: Secondary | ICD-10-CM | POA: Diagnosis not present

## 2023-10-15 DIAGNOSIS — N186 End stage renal disease: Secondary | ICD-10-CM | POA: Diagnosis not present

## 2023-10-15 DIAGNOSIS — J61 Pneumoconiosis due to asbestos and other mineral fibers: Secondary | ICD-10-CM | POA: Diagnosis not present

## 2023-10-15 DIAGNOSIS — G8194 Hemiplegia, unspecified affecting left nondominant side: Secondary | ICD-10-CM | POA: Diagnosis not present

## 2023-10-15 DIAGNOSIS — Z299 Encounter for prophylactic measures, unspecified: Secondary | ICD-10-CM | POA: Diagnosis not present

## 2023-10-15 DIAGNOSIS — E1122 Type 2 diabetes mellitus with diabetic chronic kidney disease: Secondary | ICD-10-CM | POA: Diagnosis not present

## 2023-10-16 DIAGNOSIS — D500 Iron deficiency anemia secondary to blood loss (chronic): Secondary | ICD-10-CM | POA: Diagnosis not present

## 2023-10-16 DIAGNOSIS — Z992 Dependence on renal dialysis: Secondary | ICD-10-CM | POA: Diagnosis not present

## 2023-10-16 DIAGNOSIS — N2581 Secondary hyperparathyroidism of renal origin: Secondary | ICD-10-CM | POA: Diagnosis not present

## 2023-10-16 DIAGNOSIS — N186 End stage renal disease: Secondary | ICD-10-CM | POA: Diagnosis not present

## 2023-10-17 DIAGNOSIS — N186 End stage renal disease: Secondary | ICD-10-CM | POA: Diagnosis not present

## 2023-10-17 DIAGNOSIS — N2581 Secondary hyperparathyroidism of renal origin: Secondary | ICD-10-CM | POA: Diagnosis not present

## 2023-10-17 DIAGNOSIS — Z992 Dependence on renal dialysis: Secondary | ICD-10-CM | POA: Diagnosis not present

## 2023-10-17 DIAGNOSIS — D500 Iron deficiency anemia secondary to blood loss (chronic): Secondary | ICD-10-CM | POA: Diagnosis not present

## 2023-10-18 DIAGNOSIS — E1122 Type 2 diabetes mellitus with diabetic chronic kidney disease: Secondary | ICD-10-CM | POA: Diagnosis not present

## 2023-10-18 DIAGNOSIS — N186 End stage renal disease: Secondary | ICD-10-CM | POA: Diagnosis not present

## 2023-10-21 DIAGNOSIS — L97528 Non-pressure chronic ulcer of other part of left foot with other specified severity: Secondary | ICD-10-CM | POA: Diagnosis not present

## 2023-10-21 DIAGNOSIS — E872 Acidosis, unspecified: Secondary | ICD-10-CM | POA: Diagnosis not present

## 2023-10-21 DIAGNOSIS — E1122 Type 2 diabetes mellitus with diabetic chronic kidney disease: Secondary | ICD-10-CM | POA: Diagnosis present

## 2023-10-21 DIAGNOSIS — L03116 Cellulitis of left lower limb: Secondary | ICD-10-CM | POA: Diagnosis not present

## 2023-10-21 DIAGNOSIS — N186 End stage renal disease: Secondary | ICD-10-CM | POA: Diagnosis not present

## 2023-10-21 DIAGNOSIS — I70222 Atherosclerosis of native arteries of extremities with rest pain, left leg: Secondary | ICD-10-CM | POA: Diagnosis not present

## 2023-10-21 DIAGNOSIS — I96 Gangrene, not elsewhere classified: Secondary | ICD-10-CM | POA: Diagnosis present

## 2023-10-21 DIAGNOSIS — R6520 Severe sepsis without septic shock: Secondary | ICD-10-CM | POA: Diagnosis present

## 2023-10-21 DIAGNOSIS — A4159 Other Gram-negative sepsis: Secondary | ICD-10-CM | POA: Diagnosis not present

## 2023-10-21 DIAGNOSIS — E11621 Type 2 diabetes mellitus with foot ulcer: Secondary | ICD-10-CM | POA: Diagnosis not present

## 2023-10-21 DIAGNOSIS — I739 Peripheral vascular disease, unspecified: Secondary | ICD-10-CM | POA: Diagnosis not present

## 2023-10-21 DIAGNOSIS — R600 Localized edema: Secondary | ICD-10-CM | POA: Diagnosis not present

## 2023-10-21 DIAGNOSIS — L089 Local infection of the skin and subcutaneous tissue, unspecified: Secondary | ICD-10-CM | POA: Diagnosis not present

## 2023-10-21 DIAGNOSIS — G9341 Metabolic encephalopathy: Secondary | ICD-10-CM | POA: Diagnosis not present

## 2023-10-21 DIAGNOSIS — E1152 Type 2 diabetes mellitus with diabetic peripheral angiopathy with gangrene: Secondary | ICD-10-CM | POA: Diagnosis not present

## 2023-10-21 DIAGNOSIS — Z79899 Other long term (current) drug therapy: Secondary | ICD-10-CM | POA: Diagnosis not present

## 2023-10-21 DIAGNOSIS — E1140 Type 2 diabetes mellitus with diabetic neuropathy, unspecified: Secondary | ICD-10-CM | POA: Diagnosis present

## 2023-10-21 DIAGNOSIS — M87078 Idiopathic aseptic necrosis of left toe(s): Secondary | ICD-10-CM | POA: Diagnosis not present

## 2023-10-21 DIAGNOSIS — Z794 Long term (current) use of insulin: Secondary | ICD-10-CM | POA: Diagnosis not present

## 2023-10-21 DIAGNOSIS — I10 Essential (primary) hypertension: Secondary | ICD-10-CM | POA: Diagnosis not present

## 2023-10-21 DIAGNOSIS — Z8673 Personal history of transient ischemic attack (TIA), and cerebral infarction without residual deficits: Secondary | ICD-10-CM | POA: Diagnosis not present

## 2023-10-21 DIAGNOSIS — L97522 Non-pressure chronic ulcer of other part of left foot with fat layer exposed: Secondary | ICD-10-CM | POA: Diagnosis present

## 2023-10-21 DIAGNOSIS — Z7902 Long term (current) use of antithrombotics/antiplatelets: Secondary | ICD-10-CM | POA: Diagnosis not present

## 2023-10-21 DIAGNOSIS — E11628 Type 2 diabetes mellitus with other skin complications: Secondary | ICD-10-CM | POA: Diagnosis not present

## 2023-10-21 DIAGNOSIS — D649 Anemia, unspecified: Secondary | ICD-10-CM | POA: Diagnosis not present

## 2023-10-21 DIAGNOSIS — I120 Hypertensive chronic kidney disease with stage 5 chronic kidney disease or end stage renal disease: Secondary | ICD-10-CM | POA: Diagnosis present

## 2023-10-21 DIAGNOSIS — I70262 Atherosclerosis of native arteries of extremities with gangrene, left leg: Secondary | ICD-10-CM | POA: Diagnosis not present

## 2023-10-21 DIAGNOSIS — R2689 Other abnormalities of gait and mobility: Secondary | ICD-10-CM | POA: Diagnosis present

## 2023-10-21 DIAGNOSIS — E785 Hyperlipidemia, unspecified: Secondary | ICD-10-CM | POA: Diagnosis present

## 2023-10-21 DIAGNOSIS — Z89421 Acquired absence of other right toe(s): Secondary | ICD-10-CM | POA: Diagnosis not present

## 2023-10-21 DIAGNOSIS — L97529 Non-pressure chronic ulcer of other part of left foot with unspecified severity: Secondary | ICD-10-CM | POA: Diagnosis not present

## 2023-10-21 DIAGNOSIS — A480 Gas gangrene: Secondary | ICD-10-CM | POA: Diagnosis not present

## 2023-10-21 DIAGNOSIS — Z992 Dependence on renal dialysis: Secondary | ICD-10-CM | POA: Diagnosis not present

## 2023-10-21 DIAGNOSIS — E1165 Type 2 diabetes mellitus with hyperglycemia: Secondary | ICD-10-CM | POA: Diagnosis not present

## 2023-10-21 DIAGNOSIS — D631 Anemia in chronic kidney disease: Secondary | ICD-10-CM | POA: Diagnosis present

## 2023-10-31 DIAGNOSIS — E1122 Type 2 diabetes mellitus with diabetic chronic kidney disease: Secondary | ICD-10-CM | POA: Diagnosis not present

## 2023-10-31 DIAGNOSIS — R531 Weakness: Secondary | ICD-10-CM | POA: Diagnosis not present

## 2023-10-31 DIAGNOSIS — I10 Essential (primary) hypertension: Secondary | ICD-10-CM | POA: Diagnosis not present

## 2023-10-31 DIAGNOSIS — D631 Anemia in chronic kidney disease: Secondary | ICD-10-CM | POA: Diagnosis not present

## 2023-10-31 DIAGNOSIS — D649 Anemia, unspecified: Secondary | ICD-10-CM | POA: Diagnosis not present

## 2023-10-31 DIAGNOSIS — Z8673 Personal history of transient ischemic attack (TIA), and cerebral infarction without residual deficits: Secondary | ICD-10-CM | POA: Diagnosis not present

## 2023-10-31 DIAGNOSIS — Z992 Dependence on renal dialysis: Secondary | ICD-10-CM | POA: Diagnosis not present

## 2023-10-31 DIAGNOSIS — I96 Gangrene, not elsewhere classified: Secondary | ICD-10-CM | POA: Diagnosis not present

## 2023-10-31 DIAGNOSIS — E785 Hyperlipidemia, unspecified: Secondary | ICD-10-CM | POA: Diagnosis not present

## 2023-10-31 DIAGNOSIS — Z79899 Other long term (current) drug therapy: Secondary | ICD-10-CM | POA: Diagnosis not present

## 2023-10-31 DIAGNOSIS — Z8249 Family history of ischemic heart disease and other diseases of the circulatory system: Secondary | ICD-10-CM | POA: Diagnosis not present

## 2023-10-31 DIAGNOSIS — N186 End stage renal disease: Secondary | ICD-10-CM | POA: Diagnosis not present

## 2023-10-31 DIAGNOSIS — Z794 Long term (current) use of insulin: Secondary | ICD-10-CM | POA: Diagnosis not present

## 2023-10-31 DIAGNOSIS — Z7902 Long term (current) use of antithrombotics/antiplatelets: Secondary | ICD-10-CM | POA: Diagnosis not present

## 2023-10-31 DIAGNOSIS — I120 Hypertensive chronic kidney disease with stage 5 chronic kidney disease or end stage renal disease: Secondary | ICD-10-CM | POA: Diagnosis not present

## 2023-10-31 DIAGNOSIS — N2581 Secondary hyperparathyroidism of renal origin: Secondary | ICD-10-CM | POA: Diagnosis not present

## 2023-10-31 DIAGNOSIS — Z7401 Bed confinement status: Secondary | ICD-10-CM | POA: Diagnosis not present

## 2023-10-31 DIAGNOSIS — R2689 Other abnormalities of gait and mobility: Secondary | ICD-10-CM | POA: Diagnosis not present

## 2023-10-31 DIAGNOSIS — E1165 Type 2 diabetes mellitus with hyperglycemia: Secondary | ICD-10-CM | POA: Diagnosis not present

## 2023-10-31 DIAGNOSIS — Z743 Need for continuous supervision: Secondary | ICD-10-CM | POA: Diagnosis not present

## 2023-10-31 DIAGNOSIS — I739 Peripheral vascular disease, unspecified: Secondary | ICD-10-CM | POA: Diagnosis not present

## 2023-10-31 DIAGNOSIS — R7881 Bacteremia: Secondary | ICD-10-CM | POA: Diagnosis not present

## 2023-10-31 DIAGNOSIS — Z89512 Acquired absence of left leg below knee: Secondary | ICD-10-CM | POA: Diagnosis not present

## 2023-11-01 DIAGNOSIS — D649 Anemia, unspecified: Secondary | ICD-10-CM | POA: Diagnosis not present

## 2023-11-01 DIAGNOSIS — I96 Gangrene, not elsewhere classified: Secondary | ICD-10-CM | POA: Diagnosis not present

## 2023-11-01 DIAGNOSIS — I10 Essential (primary) hypertension: Secondary | ICD-10-CM | POA: Diagnosis not present

## 2023-11-01 DIAGNOSIS — N186 End stage renal disease: Secondary | ICD-10-CM | POA: Diagnosis not present

## 2023-11-01 DIAGNOSIS — Z992 Dependence on renal dialysis: Secondary | ICD-10-CM | POA: Diagnosis not present

## 2023-11-01 DIAGNOSIS — I739 Peripheral vascular disease, unspecified: Secondary | ICD-10-CM | POA: Diagnosis not present

## 2023-11-02 DIAGNOSIS — I120 Hypertensive chronic kidney disease with stage 5 chronic kidney disease or end stage renal disease: Secondary | ICD-10-CM | POA: Diagnosis not present

## 2023-11-02 DIAGNOSIS — I96 Gangrene, not elsewhere classified: Secondary | ICD-10-CM | POA: Diagnosis not present

## 2023-11-02 DIAGNOSIS — I739 Peripheral vascular disease, unspecified: Secondary | ICD-10-CM | POA: Diagnosis not present

## 2023-11-02 DIAGNOSIS — D631 Anemia in chronic kidney disease: Secondary | ICD-10-CM | POA: Diagnosis not present

## 2023-11-02 DIAGNOSIS — N186 End stage renal disease: Secondary | ICD-10-CM | POA: Diagnosis not present

## 2023-11-02 DIAGNOSIS — Z992 Dependence on renal dialysis: Secondary | ICD-10-CM | POA: Diagnosis not present

## 2023-11-04 DIAGNOSIS — E1165 Type 2 diabetes mellitus with hyperglycemia: Secondary | ICD-10-CM | POA: Diagnosis not present

## 2023-11-05 DIAGNOSIS — D631 Anemia in chronic kidney disease: Secondary | ICD-10-CM | POA: Diagnosis not present

## 2023-11-05 DIAGNOSIS — I96 Gangrene, not elsewhere classified: Secondary | ICD-10-CM | POA: Diagnosis not present

## 2023-11-05 DIAGNOSIS — N186 End stage renal disease: Secondary | ICD-10-CM | POA: Diagnosis not present

## 2023-11-05 DIAGNOSIS — Z992 Dependence on renal dialysis: Secondary | ICD-10-CM | POA: Diagnosis not present

## 2023-11-05 DIAGNOSIS — I739 Peripheral vascular disease, unspecified: Secondary | ICD-10-CM | POA: Diagnosis not present

## 2023-11-05 DIAGNOSIS — I120 Hypertensive chronic kidney disease with stage 5 chronic kidney disease or end stage renal disease: Secondary | ICD-10-CM | POA: Diagnosis not present

## 2023-11-06 DIAGNOSIS — E1165 Type 2 diabetes mellitus with hyperglycemia: Secondary | ICD-10-CM | POA: Diagnosis not present

## 2023-11-07 DIAGNOSIS — N186 End stage renal disease: Secondary | ICD-10-CM | POA: Diagnosis not present

## 2023-11-07 DIAGNOSIS — D631 Anemia in chronic kidney disease: Secondary | ICD-10-CM | POA: Diagnosis not present

## 2023-11-07 DIAGNOSIS — Z992 Dependence on renal dialysis: Secondary | ICD-10-CM | POA: Diagnosis not present

## 2023-11-07 DIAGNOSIS — I739 Peripheral vascular disease, unspecified: Secondary | ICD-10-CM | POA: Diagnosis not present

## 2023-11-07 DIAGNOSIS — I96 Gangrene, not elsewhere classified: Secondary | ICD-10-CM | POA: Diagnosis not present

## 2023-11-07 DIAGNOSIS — I120 Hypertensive chronic kidney disease with stage 5 chronic kidney disease or end stage renal disease: Secondary | ICD-10-CM | POA: Diagnosis not present

## 2023-11-08 DIAGNOSIS — E1165 Type 2 diabetes mellitus with hyperglycemia: Secondary | ICD-10-CM | POA: Diagnosis not present

## 2023-11-09 DIAGNOSIS — I739 Peripheral vascular disease, unspecified: Secondary | ICD-10-CM | POA: Diagnosis not present

## 2023-11-09 DIAGNOSIS — I120 Hypertensive chronic kidney disease with stage 5 chronic kidney disease or end stage renal disease: Secondary | ICD-10-CM | POA: Diagnosis not present

## 2023-11-09 DIAGNOSIS — D631 Anemia in chronic kidney disease: Secondary | ICD-10-CM | POA: Diagnosis not present

## 2023-11-09 DIAGNOSIS — N186 End stage renal disease: Secondary | ICD-10-CM | POA: Diagnosis not present

## 2023-11-09 DIAGNOSIS — Z992 Dependence on renal dialysis: Secondary | ICD-10-CM | POA: Diagnosis not present

## 2023-11-09 DIAGNOSIS — I96 Gangrene, not elsewhere classified: Secondary | ICD-10-CM | POA: Diagnosis not present

## 2023-11-11 DIAGNOSIS — I96 Gangrene, not elsewhere classified: Secondary | ICD-10-CM | POA: Diagnosis not present

## 2023-11-11 DIAGNOSIS — N186 End stage renal disease: Secondary | ICD-10-CM | POA: Diagnosis not present

## 2023-11-11 DIAGNOSIS — D631 Anemia in chronic kidney disease: Secondary | ICD-10-CM | POA: Diagnosis not present

## 2023-11-11 DIAGNOSIS — Z992 Dependence on renal dialysis: Secondary | ICD-10-CM | POA: Diagnosis not present

## 2023-11-11 DIAGNOSIS — I739 Peripheral vascular disease, unspecified: Secondary | ICD-10-CM | POA: Diagnosis not present

## 2023-11-11 DIAGNOSIS — I120 Hypertensive chronic kidney disease with stage 5 chronic kidney disease or end stage renal disease: Secondary | ICD-10-CM | POA: Diagnosis not present

## 2023-11-12 DIAGNOSIS — D631 Anemia in chronic kidney disease: Secondary | ICD-10-CM | POA: Diagnosis not present

## 2023-11-12 DIAGNOSIS — Z992 Dependence on renal dialysis: Secondary | ICD-10-CM | POA: Diagnosis not present

## 2023-11-12 DIAGNOSIS — I96 Gangrene, not elsewhere classified: Secondary | ICD-10-CM | POA: Diagnosis not present

## 2023-11-12 DIAGNOSIS — I120 Hypertensive chronic kidney disease with stage 5 chronic kidney disease or end stage renal disease: Secondary | ICD-10-CM | POA: Diagnosis not present

## 2023-11-12 DIAGNOSIS — I739 Peripheral vascular disease, unspecified: Secondary | ICD-10-CM | POA: Diagnosis not present

## 2023-11-12 DIAGNOSIS — N186 End stage renal disease: Secondary | ICD-10-CM | POA: Diagnosis not present

## 2023-11-14 DIAGNOSIS — D500 Iron deficiency anemia secondary to blood loss (chronic): Secondary | ICD-10-CM | POA: Diagnosis not present

## 2023-11-14 DIAGNOSIS — Z89421 Acquired absence of other right toe(s): Secondary | ICD-10-CM | POA: Diagnosis not present

## 2023-11-14 DIAGNOSIS — I739 Peripheral vascular disease, unspecified: Secondary | ICD-10-CM | POA: Diagnosis not present

## 2023-11-14 DIAGNOSIS — D631 Anemia in chronic kidney disease: Secondary | ICD-10-CM | POA: Diagnosis not present

## 2023-11-14 DIAGNOSIS — D649 Anemia, unspecified: Secondary | ICD-10-CM | POA: Diagnosis not present

## 2023-11-14 DIAGNOSIS — Z8631 Personal history of diabetic foot ulcer: Secondary | ICD-10-CM | POA: Diagnosis not present

## 2023-11-14 DIAGNOSIS — Z4781 Encounter for orthopedic aftercare following surgical amputation: Secondary | ICD-10-CM | POA: Diagnosis not present

## 2023-11-14 DIAGNOSIS — N2581 Secondary hyperparathyroidism of renal origin: Secondary | ICD-10-CM | POA: Diagnosis not present

## 2023-11-14 DIAGNOSIS — I120 Hypertensive chronic kidney disease with stage 5 chronic kidney disease or end stage renal disease: Secondary | ICD-10-CM | POA: Diagnosis not present

## 2023-11-14 DIAGNOSIS — Z992 Dependence on renal dialysis: Secondary | ICD-10-CM | POA: Diagnosis not present

## 2023-11-14 DIAGNOSIS — E1165 Type 2 diabetes mellitus with hyperglycemia: Secondary | ICD-10-CM | POA: Diagnosis not present

## 2023-11-14 DIAGNOSIS — E785 Hyperlipidemia, unspecified: Secondary | ICD-10-CM | POA: Diagnosis not present

## 2023-11-14 DIAGNOSIS — Z8673 Personal history of transient ischemic attack (TIA), and cerebral infarction without residual deficits: Secondary | ICD-10-CM | POA: Diagnosis not present

## 2023-11-14 DIAGNOSIS — Z89521 Acquired absence of right knee: Secondary | ICD-10-CM | POA: Diagnosis not present

## 2023-11-14 DIAGNOSIS — N186 End stage renal disease: Secondary | ICD-10-CM | POA: Diagnosis not present

## 2023-11-15 DIAGNOSIS — D631 Anemia in chronic kidney disease: Secondary | ICD-10-CM | POA: Diagnosis not present

## 2023-11-15 DIAGNOSIS — N2581 Secondary hyperparathyroidism of renal origin: Secondary | ICD-10-CM | POA: Diagnosis not present

## 2023-11-15 DIAGNOSIS — N186 End stage renal disease: Secondary | ICD-10-CM | POA: Diagnosis not present

## 2023-11-15 DIAGNOSIS — D500 Iron deficiency anemia secondary to blood loss (chronic): Secondary | ICD-10-CM | POA: Diagnosis not present

## 2023-11-15 DIAGNOSIS — Z992 Dependence on renal dialysis: Secondary | ICD-10-CM | POA: Diagnosis not present

## 2023-11-18 DIAGNOSIS — Z992 Dependence on renal dialysis: Secondary | ICD-10-CM | POA: Diagnosis not present

## 2023-11-18 DIAGNOSIS — D631 Anemia in chronic kidney disease: Secondary | ICD-10-CM | POA: Diagnosis not present

## 2023-11-18 DIAGNOSIS — N186 End stage renal disease: Secondary | ICD-10-CM | POA: Diagnosis not present

## 2023-11-18 DIAGNOSIS — N2581 Secondary hyperparathyroidism of renal origin: Secondary | ICD-10-CM | POA: Diagnosis not present

## 2023-11-18 DIAGNOSIS — D500 Iron deficiency anemia secondary to blood loss (chronic): Secondary | ICD-10-CM | POA: Diagnosis not present

## 2023-11-19 DIAGNOSIS — I120 Hypertensive chronic kidney disease with stage 5 chronic kidney disease or end stage renal disease: Secondary | ICD-10-CM | POA: Diagnosis not present

## 2023-11-19 DIAGNOSIS — N186 End stage renal disease: Secondary | ICD-10-CM | POA: Diagnosis not present

## 2023-11-19 DIAGNOSIS — Z89521 Acquired absence of right knee: Secondary | ICD-10-CM | POA: Diagnosis not present

## 2023-11-19 DIAGNOSIS — E1165 Type 2 diabetes mellitus with hyperglycemia: Secondary | ICD-10-CM | POA: Diagnosis not present

## 2023-11-19 DIAGNOSIS — Z4781 Encounter for orthopedic aftercare following surgical amputation: Secondary | ICD-10-CM | POA: Diagnosis not present

## 2023-11-19 DIAGNOSIS — I739 Peripheral vascular disease, unspecified: Secondary | ICD-10-CM | POA: Diagnosis not present

## 2023-11-20 DIAGNOSIS — N2581 Secondary hyperparathyroidism of renal origin: Secondary | ICD-10-CM | POA: Diagnosis not present

## 2023-11-20 DIAGNOSIS — N186 End stage renal disease: Secondary | ICD-10-CM | POA: Diagnosis not present

## 2023-11-20 DIAGNOSIS — D500 Iron deficiency anemia secondary to blood loss (chronic): Secondary | ICD-10-CM | POA: Diagnosis not present

## 2023-11-20 DIAGNOSIS — Z992 Dependence on renal dialysis: Secondary | ICD-10-CM | POA: Diagnosis not present

## 2023-11-20 DIAGNOSIS — D631 Anemia in chronic kidney disease: Secondary | ICD-10-CM | POA: Diagnosis not present

## 2023-11-21 DIAGNOSIS — N186 End stage renal disease: Secondary | ICD-10-CM | POA: Diagnosis not present

## 2023-11-21 DIAGNOSIS — Z89521 Acquired absence of right knee: Secondary | ICD-10-CM | POA: Diagnosis not present

## 2023-11-21 DIAGNOSIS — I120 Hypertensive chronic kidney disease with stage 5 chronic kidney disease or end stage renal disease: Secondary | ICD-10-CM | POA: Diagnosis not present

## 2023-11-21 DIAGNOSIS — Z4781 Encounter for orthopedic aftercare following surgical amputation: Secondary | ICD-10-CM | POA: Diagnosis not present

## 2023-11-21 DIAGNOSIS — I739 Peripheral vascular disease, unspecified: Secondary | ICD-10-CM | POA: Diagnosis not present

## 2023-11-21 DIAGNOSIS — E1165 Type 2 diabetes mellitus with hyperglycemia: Secondary | ICD-10-CM | POA: Diagnosis not present

## 2023-11-22 DIAGNOSIS — Z992 Dependence on renal dialysis: Secondary | ICD-10-CM | POA: Diagnosis not present

## 2023-11-22 DIAGNOSIS — N186 End stage renal disease: Secondary | ICD-10-CM | POA: Diagnosis not present

## 2023-11-22 DIAGNOSIS — D500 Iron deficiency anemia secondary to blood loss (chronic): Secondary | ICD-10-CM | POA: Diagnosis not present

## 2023-11-22 DIAGNOSIS — N2581 Secondary hyperparathyroidism of renal origin: Secondary | ICD-10-CM | POA: Diagnosis not present

## 2023-11-22 DIAGNOSIS — D631 Anemia in chronic kidney disease: Secondary | ICD-10-CM | POA: Diagnosis not present

## 2023-11-25 DIAGNOSIS — N186 End stage renal disease: Secondary | ICD-10-CM | POA: Diagnosis not present

## 2023-11-25 DIAGNOSIS — N2581 Secondary hyperparathyroidism of renal origin: Secondary | ICD-10-CM | POA: Diagnosis not present

## 2023-11-25 DIAGNOSIS — D500 Iron deficiency anemia secondary to blood loss (chronic): Secondary | ICD-10-CM | POA: Diagnosis not present

## 2023-11-25 DIAGNOSIS — D631 Anemia in chronic kidney disease: Secondary | ICD-10-CM | POA: Diagnosis not present

## 2023-11-25 DIAGNOSIS — Z992 Dependence on renal dialysis: Secondary | ICD-10-CM | POA: Diagnosis not present

## 2023-11-26 DIAGNOSIS — Z4781 Encounter for orthopedic aftercare following surgical amputation: Secondary | ICD-10-CM | POA: Diagnosis not present

## 2023-11-26 DIAGNOSIS — N186 End stage renal disease: Secondary | ICD-10-CM | POA: Diagnosis not present

## 2023-11-26 DIAGNOSIS — I120 Hypertensive chronic kidney disease with stage 5 chronic kidney disease or end stage renal disease: Secondary | ICD-10-CM | POA: Diagnosis not present

## 2023-11-26 DIAGNOSIS — Z89521 Acquired absence of right knee: Secondary | ICD-10-CM | POA: Diagnosis not present

## 2023-11-26 DIAGNOSIS — I739 Peripheral vascular disease, unspecified: Secondary | ICD-10-CM | POA: Diagnosis not present

## 2023-11-26 DIAGNOSIS — E1165 Type 2 diabetes mellitus with hyperglycemia: Secondary | ICD-10-CM | POA: Diagnosis not present

## 2023-11-27 DIAGNOSIS — D500 Iron deficiency anemia secondary to blood loss (chronic): Secondary | ICD-10-CM | POA: Diagnosis not present

## 2023-11-27 DIAGNOSIS — D631 Anemia in chronic kidney disease: Secondary | ICD-10-CM | POA: Diagnosis not present

## 2023-11-27 DIAGNOSIS — Z992 Dependence on renal dialysis: Secondary | ICD-10-CM | POA: Diagnosis not present

## 2023-11-27 DIAGNOSIS — N186 End stage renal disease: Secondary | ICD-10-CM | POA: Diagnosis not present

## 2023-11-27 DIAGNOSIS — N2581 Secondary hyperparathyroidism of renal origin: Secondary | ICD-10-CM | POA: Diagnosis not present

## 2023-11-28 DIAGNOSIS — Z4781 Encounter for orthopedic aftercare following surgical amputation: Secondary | ICD-10-CM | POA: Diagnosis not present

## 2023-11-28 DIAGNOSIS — Z89521 Acquired absence of right knee: Secondary | ICD-10-CM | POA: Diagnosis not present

## 2023-11-28 DIAGNOSIS — I120 Hypertensive chronic kidney disease with stage 5 chronic kidney disease or end stage renal disease: Secondary | ICD-10-CM | POA: Diagnosis not present

## 2023-11-28 DIAGNOSIS — E1165 Type 2 diabetes mellitus with hyperglycemia: Secondary | ICD-10-CM | POA: Diagnosis not present

## 2023-11-28 DIAGNOSIS — Z89512 Acquired absence of left leg below knee: Secondary | ICD-10-CM | POA: Diagnosis not present

## 2023-11-28 DIAGNOSIS — N186 End stage renal disease: Secondary | ICD-10-CM | POA: Diagnosis not present

## 2023-11-28 DIAGNOSIS — L97511 Non-pressure chronic ulcer of other part of right foot limited to breakdown of skin: Secondary | ICD-10-CM | POA: Diagnosis not present

## 2023-11-28 DIAGNOSIS — I739 Peripheral vascular disease, unspecified: Secondary | ICD-10-CM | POA: Diagnosis not present

## 2023-11-29 DIAGNOSIS — N2581 Secondary hyperparathyroidism of renal origin: Secondary | ICD-10-CM | POA: Diagnosis not present

## 2023-11-29 DIAGNOSIS — Z992 Dependence on renal dialysis: Secondary | ICD-10-CM | POA: Diagnosis not present

## 2023-11-29 DIAGNOSIS — N186 End stage renal disease: Secondary | ICD-10-CM | POA: Diagnosis not present

## 2023-11-29 DIAGNOSIS — D500 Iron deficiency anemia secondary to blood loss (chronic): Secondary | ICD-10-CM | POA: Diagnosis not present

## 2023-11-29 DIAGNOSIS — D631 Anemia in chronic kidney disease: Secondary | ICD-10-CM | POA: Diagnosis not present

## 2023-12-01 DIAGNOSIS — N186 End stage renal disease: Secondary | ICD-10-CM | POA: Diagnosis not present

## 2023-12-01 DIAGNOSIS — Z992 Dependence on renal dialysis: Secondary | ICD-10-CM | POA: Diagnosis not present

## 2023-12-02 DIAGNOSIS — Z992 Dependence on renal dialysis: Secondary | ICD-10-CM | POA: Diagnosis not present

## 2023-12-02 DIAGNOSIS — N2581 Secondary hyperparathyroidism of renal origin: Secondary | ICD-10-CM | POA: Diagnosis not present

## 2023-12-02 DIAGNOSIS — D500 Iron deficiency anemia secondary to blood loss (chronic): Secondary | ICD-10-CM | POA: Diagnosis not present

## 2023-12-02 DIAGNOSIS — D631 Anemia in chronic kidney disease: Secondary | ICD-10-CM | POA: Diagnosis not present

## 2023-12-02 DIAGNOSIS — N186 End stage renal disease: Secondary | ICD-10-CM | POA: Diagnosis not present

## 2023-12-02 DIAGNOSIS — D509 Iron deficiency anemia, unspecified: Secondary | ICD-10-CM | POA: Diagnosis not present

## 2023-12-03 DIAGNOSIS — Z4781 Encounter for orthopedic aftercare following surgical amputation: Secondary | ICD-10-CM | POA: Diagnosis not present

## 2023-12-03 DIAGNOSIS — N186 End stage renal disease: Secondary | ICD-10-CM | POA: Diagnosis not present

## 2023-12-03 DIAGNOSIS — I120 Hypertensive chronic kidney disease with stage 5 chronic kidney disease or end stage renal disease: Secondary | ICD-10-CM | POA: Diagnosis not present

## 2023-12-03 DIAGNOSIS — Z89521 Acquired absence of right knee: Secondary | ICD-10-CM | POA: Diagnosis not present

## 2023-12-03 DIAGNOSIS — I739 Peripheral vascular disease, unspecified: Secondary | ICD-10-CM | POA: Diagnosis not present

## 2023-12-03 DIAGNOSIS — E1165 Type 2 diabetes mellitus with hyperglycemia: Secondary | ICD-10-CM | POA: Diagnosis not present

## 2023-12-04 DIAGNOSIS — D509 Iron deficiency anemia, unspecified: Secondary | ICD-10-CM | POA: Diagnosis not present

## 2023-12-04 DIAGNOSIS — N2581 Secondary hyperparathyroidism of renal origin: Secondary | ICD-10-CM | POA: Diagnosis not present

## 2023-12-04 DIAGNOSIS — N186 End stage renal disease: Secondary | ICD-10-CM | POA: Diagnosis not present

## 2023-12-04 DIAGNOSIS — D631 Anemia in chronic kidney disease: Secondary | ICD-10-CM | POA: Diagnosis not present

## 2023-12-04 DIAGNOSIS — D500 Iron deficiency anemia secondary to blood loss (chronic): Secondary | ICD-10-CM | POA: Diagnosis not present

## 2023-12-04 DIAGNOSIS — Z992 Dependence on renal dialysis: Secondary | ICD-10-CM | POA: Diagnosis not present

## 2023-12-05 DIAGNOSIS — N186 End stage renal disease: Secondary | ICD-10-CM | POA: Diagnosis not present

## 2023-12-05 DIAGNOSIS — Z4781 Encounter for orthopedic aftercare following surgical amputation: Secondary | ICD-10-CM | POA: Diagnosis not present

## 2023-12-05 DIAGNOSIS — I120 Hypertensive chronic kidney disease with stage 5 chronic kidney disease or end stage renal disease: Secondary | ICD-10-CM | POA: Diagnosis not present

## 2023-12-05 DIAGNOSIS — I739 Peripheral vascular disease, unspecified: Secondary | ICD-10-CM | POA: Diagnosis not present

## 2023-12-05 DIAGNOSIS — E1165 Type 2 diabetes mellitus with hyperglycemia: Secondary | ICD-10-CM | POA: Diagnosis not present

## 2023-12-05 DIAGNOSIS — Z89521 Acquired absence of right knee: Secondary | ICD-10-CM | POA: Diagnosis not present

## 2023-12-06 DIAGNOSIS — D500 Iron deficiency anemia secondary to blood loss (chronic): Secondary | ICD-10-CM | POA: Diagnosis not present

## 2023-12-06 DIAGNOSIS — Z992 Dependence on renal dialysis: Secondary | ICD-10-CM | POA: Diagnosis not present

## 2023-12-06 DIAGNOSIS — D631 Anemia in chronic kidney disease: Secondary | ICD-10-CM | POA: Diagnosis not present

## 2023-12-06 DIAGNOSIS — N186 End stage renal disease: Secondary | ICD-10-CM | POA: Diagnosis not present

## 2023-12-06 DIAGNOSIS — N2581 Secondary hyperparathyroidism of renal origin: Secondary | ICD-10-CM | POA: Diagnosis not present

## 2023-12-06 DIAGNOSIS — D509 Iron deficiency anemia, unspecified: Secondary | ICD-10-CM | POA: Diagnosis not present

## 2023-12-09 DIAGNOSIS — D509 Iron deficiency anemia, unspecified: Secondary | ICD-10-CM | POA: Diagnosis not present

## 2023-12-09 DIAGNOSIS — N186 End stage renal disease: Secondary | ICD-10-CM | POA: Diagnosis not present

## 2023-12-09 DIAGNOSIS — Z992 Dependence on renal dialysis: Secondary | ICD-10-CM | POA: Diagnosis not present

## 2023-12-09 DIAGNOSIS — D500 Iron deficiency anemia secondary to blood loss (chronic): Secondary | ICD-10-CM | POA: Diagnosis not present

## 2023-12-09 DIAGNOSIS — N2581 Secondary hyperparathyroidism of renal origin: Secondary | ICD-10-CM | POA: Diagnosis not present

## 2023-12-09 DIAGNOSIS — D631 Anemia in chronic kidney disease: Secondary | ICD-10-CM | POA: Diagnosis not present

## 2023-12-10 DIAGNOSIS — Z4781 Encounter for orthopedic aftercare following surgical amputation: Secondary | ICD-10-CM | POA: Diagnosis not present

## 2023-12-10 DIAGNOSIS — I120 Hypertensive chronic kidney disease with stage 5 chronic kidney disease or end stage renal disease: Secondary | ICD-10-CM | POA: Diagnosis not present

## 2023-12-10 DIAGNOSIS — I739 Peripheral vascular disease, unspecified: Secondary | ICD-10-CM | POA: Diagnosis not present

## 2023-12-10 DIAGNOSIS — N186 End stage renal disease: Secondary | ICD-10-CM | POA: Diagnosis not present

## 2023-12-10 DIAGNOSIS — Z89521 Acquired absence of right knee: Secondary | ICD-10-CM | POA: Diagnosis not present

## 2023-12-10 DIAGNOSIS — E1165 Type 2 diabetes mellitus with hyperglycemia: Secondary | ICD-10-CM | POA: Diagnosis not present

## 2023-12-11 DIAGNOSIS — D631 Anemia in chronic kidney disease: Secondary | ICD-10-CM | POA: Diagnosis not present

## 2023-12-11 DIAGNOSIS — N2581 Secondary hyperparathyroidism of renal origin: Secondary | ICD-10-CM | POA: Diagnosis not present

## 2023-12-11 DIAGNOSIS — D509 Iron deficiency anemia, unspecified: Secondary | ICD-10-CM | POA: Diagnosis not present

## 2023-12-11 DIAGNOSIS — D500 Iron deficiency anemia secondary to blood loss (chronic): Secondary | ICD-10-CM | POA: Diagnosis not present

## 2023-12-11 DIAGNOSIS — Z992 Dependence on renal dialysis: Secondary | ICD-10-CM | POA: Diagnosis not present

## 2023-12-11 DIAGNOSIS — N186 End stage renal disease: Secondary | ICD-10-CM | POA: Diagnosis not present

## 2023-12-12 DIAGNOSIS — L603 Nail dystrophy: Secondary | ICD-10-CM | POA: Diagnosis not present

## 2023-12-12 DIAGNOSIS — Z89521 Acquired absence of right knee: Secondary | ICD-10-CM | POA: Diagnosis not present

## 2023-12-12 DIAGNOSIS — Z4781 Encounter for orthopedic aftercare following surgical amputation: Secondary | ICD-10-CM | POA: Diagnosis not present

## 2023-12-12 DIAGNOSIS — E1165 Type 2 diabetes mellitus with hyperglycemia: Secondary | ICD-10-CM | POA: Diagnosis not present

## 2023-12-12 DIAGNOSIS — L97512 Non-pressure chronic ulcer of other part of right foot with fat layer exposed: Secondary | ICD-10-CM | POA: Diagnosis not present

## 2023-12-12 DIAGNOSIS — I739 Peripheral vascular disease, unspecified: Secondary | ICD-10-CM | POA: Diagnosis not present

## 2023-12-12 DIAGNOSIS — I120 Hypertensive chronic kidney disease with stage 5 chronic kidney disease or end stage renal disease: Secondary | ICD-10-CM | POA: Diagnosis not present

## 2023-12-12 DIAGNOSIS — N186 End stage renal disease: Secondary | ICD-10-CM | POA: Diagnosis not present

## 2023-12-12 DIAGNOSIS — L97514 Non-pressure chronic ulcer of other part of right foot with necrosis of bone: Secondary | ICD-10-CM | POA: Diagnosis not present

## 2023-12-13 DIAGNOSIS — Z992 Dependence on renal dialysis: Secondary | ICD-10-CM | POA: Diagnosis not present

## 2023-12-13 DIAGNOSIS — D500 Iron deficiency anemia secondary to blood loss (chronic): Secondary | ICD-10-CM | POA: Diagnosis not present

## 2023-12-13 DIAGNOSIS — N186 End stage renal disease: Secondary | ICD-10-CM | POA: Diagnosis not present

## 2023-12-13 DIAGNOSIS — D631 Anemia in chronic kidney disease: Secondary | ICD-10-CM | POA: Diagnosis not present

## 2023-12-13 DIAGNOSIS — D509 Iron deficiency anemia, unspecified: Secondary | ICD-10-CM | POA: Diagnosis not present

## 2023-12-13 DIAGNOSIS — N2581 Secondary hyperparathyroidism of renal origin: Secondary | ICD-10-CM | POA: Diagnosis not present

## 2023-12-14 DIAGNOSIS — Z992 Dependence on renal dialysis: Secondary | ICD-10-CM | POA: Diagnosis not present

## 2023-12-14 DIAGNOSIS — Z8631 Personal history of diabetic foot ulcer: Secondary | ICD-10-CM | POA: Diagnosis not present

## 2023-12-14 DIAGNOSIS — Z4781 Encounter for orthopedic aftercare following surgical amputation: Secondary | ICD-10-CM | POA: Diagnosis not present

## 2023-12-14 DIAGNOSIS — I120 Hypertensive chronic kidney disease with stage 5 chronic kidney disease or end stage renal disease: Secondary | ICD-10-CM | POA: Diagnosis not present

## 2023-12-14 DIAGNOSIS — E1165 Type 2 diabetes mellitus with hyperglycemia: Secondary | ICD-10-CM | POA: Diagnosis not present

## 2023-12-14 DIAGNOSIS — Z89421 Acquired absence of other right toe(s): Secondary | ICD-10-CM | POA: Diagnosis not present

## 2023-12-14 DIAGNOSIS — Z89521 Acquired absence of right knee: Secondary | ICD-10-CM | POA: Diagnosis not present

## 2023-12-14 DIAGNOSIS — Z8673 Personal history of transient ischemic attack (TIA), and cerebral infarction without residual deficits: Secondary | ICD-10-CM | POA: Diagnosis not present

## 2023-12-14 DIAGNOSIS — I739 Peripheral vascular disease, unspecified: Secondary | ICD-10-CM | POA: Diagnosis not present

## 2023-12-14 DIAGNOSIS — D649 Anemia, unspecified: Secondary | ICD-10-CM | POA: Diagnosis not present

## 2023-12-14 DIAGNOSIS — E785 Hyperlipidemia, unspecified: Secondary | ICD-10-CM | POA: Diagnosis not present

## 2023-12-14 DIAGNOSIS — N186 End stage renal disease: Secondary | ICD-10-CM | POA: Diagnosis not present

## 2023-12-16 DIAGNOSIS — D500 Iron deficiency anemia secondary to blood loss (chronic): Secondary | ICD-10-CM | POA: Diagnosis not present

## 2023-12-16 DIAGNOSIS — D509 Iron deficiency anemia, unspecified: Secondary | ICD-10-CM | POA: Diagnosis not present

## 2023-12-16 DIAGNOSIS — Z992 Dependence on renal dialysis: Secondary | ICD-10-CM | POA: Diagnosis not present

## 2023-12-16 DIAGNOSIS — N2581 Secondary hyperparathyroidism of renal origin: Secondary | ICD-10-CM | POA: Diagnosis not present

## 2023-12-16 DIAGNOSIS — D631 Anemia in chronic kidney disease: Secondary | ICD-10-CM | POA: Diagnosis not present

## 2023-12-16 DIAGNOSIS — N186 End stage renal disease: Secondary | ICD-10-CM | POA: Diagnosis not present

## 2023-12-17 DIAGNOSIS — I739 Peripheral vascular disease, unspecified: Secondary | ICD-10-CM | POA: Diagnosis not present

## 2023-12-17 DIAGNOSIS — I120 Hypertensive chronic kidney disease with stage 5 chronic kidney disease or end stage renal disease: Secondary | ICD-10-CM | POA: Diagnosis not present

## 2023-12-17 DIAGNOSIS — E1165 Type 2 diabetes mellitus with hyperglycemia: Secondary | ICD-10-CM | POA: Diagnosis not present

## 2023-12-17 DIAGNOSIS — Z4781 Encounter for orthopedic aftercare following surgical amputation: Secondary | ICD-10-CM | POA: Diagnosis not present

## 2023-12-17 DIAGNOSIS — N186 End stage renal disease: Secondary | ICD-10-CM | POA: Diagnosis not present

## 2023-12-17 DIAGNOSIS — Z89521 Acquired absence of right knee: Secondary | ICD-10-CM | POA: Diagnosis not present

## 2023-12-18 DIAGNOSIS — N186 End stage renal disease: Secondary | ICD-10-CM | POA: Diagnosis not present

## 2023-12-18 DIAGNOSIS — D631 Anemia in chronic kidney disease: Secondary | ICD-10-CM | POA: Diagnosis not present

## 2023-12-18 DIAGNOSIS — D509 Iron deficiency anemia, unspecified: Secondary | ICD-10-CM | POA: Diagnosis not present

## 2023-12-18 DIAGNOSIS — N2581 Secondary hyperparathyroidism of renal origin: Secondary | ICD-10-CM | POA: Diagnosis not present

## 2023-12-18 DIAGNOSIS — D500 Iron deficiency anemia secondary to blood loss (chronic): Secondary | ICD-10-CM | POA: Diagnosis not present

## 2023-12-18 DIAGNOSIS — Z992 Dependence on renal dialysis: Secondary | ICD-10-CM | POA: Diagnosis not present

## 2023-12-19 DIAGNOSIS — I739 Peripheral vascular disease, unspecified: Secondary | ICD-10-CM | POA: Diagnosis not present

## 2023-12-19 DIAGNOSIS — I120 Hypertensive chronic kidney disease with stage 5 chronic kidney disease or end stage renal disease: Secondary | ICD-10-CM | POA: Diagnosis not present

## 2023-12-19 DIAGNOSIS — N186 End stage renal disease: Secondary | ICD-10-CM | POA: Diagnosis not present

## 2023-12-19 DIAGNOSIS — Z89521 Acquired absence of right knee: Secondary | ICD-10-CM | POA: Diagnosis not present

## 2023-12-19 DIAGNOSIS — E1165 Type 2 diabetes mellitus with hyperglycemia: Secondary | ICD-10-CM | POA: Diagnosis not present

## 2023-12-19 DIAGNOSIS — Z4781 Encounter for orthopedic aftercare following surgical amputation: Secondary | ICD-10-CM | POA: Diagnosis not present

## 2023-12-20 DIAGNOSIS — N186 End stage renal disease: Secondary | ICD-10-CM | POA: Diagnosis not present

## 2023-12-20 DIAGNOSIS — D509 Iron deficiency anemia, unspecified: Secondary | ICD-10-CM | POA: Diagnosis not present

## 2023-12-20 DIAGNOSIS — Z992 Dependence on renal dialysis: Secondary | ICD-10-CM | POA: Diagnosis not present

## 2023-12-20 DIAGNOSIS — D631 Anemia in chronic kidney disease: Secondary | ICD-10-CM | POA: Diagnosis not present

## 2023-12-20 DIAGNOSIS — D500 Iron deficiency anemia secondary to blood loss (chronic): Secondary | ICD-10-CM | POA: Diagnosis not present

## 2023-12-20 DIAGNOSIS — N2581 Secondary hyperparathyroidism of renal origin: Secondary | ICD-10-CM | POA: Diagnosis not present

## 2023-12-23 DIAGNOSIS — Z992 Dependence on renal dialysis: Secondary | ICD-10-CM | POA: Diagnosis not present

## 2023-12-23 DIAGNOSIS — D631 Anemia in chronic kidney disease: Secondary | ICD-10-CM | POA: Diagnosis not present

## 2023-12-23 DIAGNOSIS — N2581 Secondary hyperparathyroidism of renal origin: Secondary | ICD-10-CM | POA: Diagnosis not present

## 2023-12-23 DIAGNOSIS — D509 Iron deficiency anemia, unspecified: Secondary | ICD-10-CM | POA: Diagnosis not present

## 2023-12-23 DIAGNOSIS — N186 End stage renal disease: Secondary | ICD-10-CM | POA: Diagnosis not present

## 2023-12-23 DIAGNOSIS — D500 Iron deficiency anemia secondary to blood loss (chronic): Secondary | ICD-10-CM | POA: Diagnosis not present

## 2023-12-24 DIAGNOSIS — I120 Hypertensive chronic kidney disease with stage 5 chronic kidney disease or end stage renal disease: Secondary | ICD-10-CM | POA: Diagnosis not present

## 2023-12-24 DIAGNOSIS — Z4781 Encounter for orthopedic aftercare following surgical amputation: Secondary | ICD-10-CM | POA: Diagnosis not present

## 2023-12-24 DIAGNOSIS — E1165 Type 2 diabetes mellitus with hyperglycemia: Secondary | ICD-10-CM | POA: Diagnosis not present

## 2023-12-24 DIAGNOSIS — I739 Peripheral vascular disease, unspecified: Secondary | ICD-10-CM | POA: Diagnosis not present

## 2023-12-24 DIAGNOSIS — Z89521 Acquired absence of right knee: Secondary | ICD-10-CM | POA: Diagnosis not present

## 2023-12-24 DIAGNOSIS — N186 End stage renal disease: Secondary | ICD-10-CM | POA: Diagnosis not present

## 2023-12-25 DIAGNOSIS — N186 End stage renal disease: Secondary | ICD-10-CM | POA: Diagnosis not present

## 2023-12-25 DIAGNOSIS — D509 Iron deficiency anemia, unspecified: Secondary | ICD-10-CM | POA: Diagnosis not present

## 2023-12-25 DIAGNOSIS — N2581 Secondary hyperparathyroidism of renal origin: Secondary | ICD-10-CM | POA: Diagnosis not present

## 2023-12-25 DIAGNOSIS — D631 Anemia in chronic kidney disease: Secondary | ICD-10-CM | POA: Diagnosis not present

## 2023-12-25 DIAGNOSIS — Z992 Dependence on renal dialysis: Secondary | ICD-10-CM | POA: Diagnosis not present

## 2023-12-25 DIAGNOSIS — D500 Iron deficiency anemia secondary to blood loss (chronic): Secondary | ICD-10-CM | POA: Diagnosis not present

## 2023-12-26 DIAGNOSIS — I739 Peripheral vascular disease, unspecified: Secondary | ICD-10-CM | POA: Diagnosis not present

## 2023-12-26 DIAGNOSIS — E1165 Type 2 diabetes mellitus with hyperglycemia: Secondary | ICD-10-CM | POA: Diagnosis not present

## 2023-12-26 DIAGNOSIS — I120 Hypertensive chronic kidney disease with stage 5 chronic kidney disease or end stage renal disease: Secondary | ICD-10-CM | POA: Diagnosis not present

## 2023-12-26 DIAGNOSIS — Z4781 Encounter for orthopedic aftercare following surgical amputation: Secondary | ICD-10-CM | POA: Diagnosis not present

## 2023-12-26 DIAGNOSIS — Z89521 Acquired absence of right knee: Secondary | ICD-10-CM | POA: Diagnosis not present

## 2023-12-26 DIAGNOSIS — N186 End stage renal disease: Secondary | ICD-10-CM | POA: Diagnosis not present

## 2023-12-27 DIAGNOSIS — D500 Iron deficiency anemia secondary to blood loss (chronic): Secondary | ICD-10-CM | POA: Diagnosis not present

## 2023-12-27 DIAGNOSIS — N2581 Secondary hyperparathyroidism of renal origin: Secondary | ICD-10-CM | POA: Diagnosis not present

## 2023-12-27 DIAGNOSIS — D509 Iron deficiency anemia, unspecified: Secondary | ICD-10-CM | POA: Diagnosis not present

## 2023-12-27 DIAGNOSIS — D631 Anemia in chronic kidney disease: Secondary | ICD-10-CM | POA: Diagnosis not present

## 2023-12-27 DIAGNOSIS — Z992 Dependence on renal dialysis: Secondary | ICD-10-CM | POA: Diagnosis not present

## 2023-12-27 DIAGNOSIS — N186 End stage renal disease: Secondary | ICD-10-CM | POA: Diagnosis not present

## 2023-12-30 DIAGNOSIS — Z992 Dependence on renal dialysis: Secondary | ICD-10-CM | POA: Diagnosis not present

## 2023-12-30 DIAGNOSIS — N2581 Secondary hyperparathyroidism of renal origin: Secondary | ICD-10-CM | POA: Diagnosis not present

## 2023-12-30 DIAGNOSIS — N186 End stage renal disease: Secondary | ICD-10-CM | POA: Diagnosis not present

## 2023-12-30 DIAGNOSIS — D631 Anemia in chronic kidney disease: Secondary | ICD-10-CM | POA: Diagnosis not present

## 2023-12-30 DIAGNOSIS — D509 Iron deficiency anemia, unspecified: Secondary | ICD-10-CM | POA: Diagnosis not present

## 2023-12-30 DIAGNOSIS — D500 Iron deficiency anemia secondary to blood loss (chronic): Secondary | ICD-10-CM | POA: Diagnosis not present

## 2023-12-31 DIAGNOSIS — Z992 Dependence on renal dialysis: Secondary | ICD-10-CM | POA: Diagnosis not present

## 2023-12-31 DIAGNOSIS — I120 Hypertensive chronic kidney disease with stage 5 chronic kidney disease or end stage renal disease: Secondary | ICD-10-CM | POA: Diagnosis not present

## 2023-12-31 DIAGNOSIS — N186 End stage renal disease: Secondary | ICD-10-CM | POA: Diagnosis not present

## 2023-12-31 DIAGNOSIS — Z4781 Encounter for orthopedic aftercare following surgical amputation: Secondary | ICD-10-CM | POA: Diagnosis not present

## 2023-12-31 DIAGNOSIS — Z89521 Acquired absence of right knee: Secondary | ICD-10-CM | POA: Diagnosis not present

## 2023-12-31 DIAGNOSIS — I739 Peripheral vascular disease, unspecified: Secondary | ICD-10-CM | POA: Diagnosis not present

## 2023-12-31 DIAGNOSIS — E1165 Type 2 diabetes mellitus with hyperglycemia: Secondary | ICD-10-CM | POA: Diagnosis not present

## 2024-01-01 DIAGNOSIS — Z23 Encounter for immunization: Secondary | ICD-10-CM | POA: Diagnosis not present

## 2024-01-01 DIAGNOSIS — N186 End stage renal disease: Secondary | ICD-10-CM | POA: Diagnosis not present

## 2024-01-01 DIAGNOSIS — N2581 Secondary hyperparathyroidism of renal origin: Secondary | ICD-10-CM | POA: Diagnosis not present

## 2024-01-01 DIAGNOSIS — D631 Anemia in chronic kidney disease: Secondary | ICD-10-CM | POA: Diagnosis not present

## 2024-01-01 DIAGNOSIS — Z992 Dependence on renal dialysis: Secondary | ICD-10-CM | POA: Diagnosis not present

## 2024-01-01 DIAGNOSIS — D509 Iron deficiency anemia, unspecified: Secondary | ICD-10-CM | POA: Diagnosis not present

## 2024-01-02 DIAGNOSIS — N186 End stage renal disease: Secondary | ICD-10-CM | POA: Diagnosis not present

## 2024-01-02 DIAGNOSIS — E1165 Type 2 diabetes mellitus with hyperglycemia: Secondary | ICD-10-CM | POA: Diagnosis not present

## 2024-01-02 DIAGNOSIS — Z4781 Encounter for orthopedic aftercare following surgical amputation: Secondary | ICD-10-CM | POA: Diagnosis not present

## 2024-01-02 DIAGNOSIS — E1140 Type 2 diabetes mellitus with diabetic neuropathy, unspecified: Secondary | ICD-10-CM | POA: Diagnosis not present

## 2024-01-02 DIAGNOSIS — L97512 Non-pressure chronic ulcer of other part of right foot with fat layer exposed: Secondary | ICD-10-CM | POA: Diagnosis not present

## 2024-01-02 DIAGNOSIS — I120 Hypertensive chronic kidney disease with stage 5 chronic kidney disease or end stage renal disease: Secondary | ICD-10-CM | POA: Diagnosis not present

## 2024-01-02 DIAGNOSIS — Z89521 Acquired absence of right knee: Secondary | ICD-10-CM | POA: Diagnosis not present

## 2024-01-02 DIAGNOSIS — I739 Peripheral vascular disease, unspecified: Secondary | ICD-10-CM | POA: Diagnosis not present

## 2024-01-03 DIAGNOSIS — N2581 Secondary hyperparathyroidism of renal origin: Secondary | ICD-10-CM | POA: Diagnosis not present

## 2024-01-03 DIAGNOSIS — D631 Anemia in chronic kidney disease: Secondary | ICD-10-CM | POA: Diagnosis not present

## 2024-01-03 DIAGNOSIS — N186 End stage renal disease: Secondary | ICD-10-CM | POA: Diagnosis not present

## 2024-01-03 DIAGNOSIS — Z992 Dependence on renal dialysis: Secondary | ICD-10-CM | POA: Diagnosis not present

## 2024-01-03 DIAGNOSIS — Z23 Encounter for immunization: Secondary | ICD-10-CM | POA: Diagnosis not present

## 2024-01-03 DIAGNOSIS — D509 Iron deficiency anemia, unspecified: Secondary | ICD-10-CM | POA: Diagnosis not present

## 2024-01-06 DIAGNOSIS — Z794 Long term (current) use of insulin: Secondary | ICD-10-CM | POA: Diagnosis not present

## 2024-01-06 DIAGNOSIS — I252 Old myocardial infarction: Secondary | ICD-10-CM | POA: Diagnosis not present

## 2024-01-06 DIAGNOSIS — Z992 Dependence on renal dialysis: Secondary | ICD-10-CM | POA: Diagnosis not present

## 2024-01-06 DIAGNOSIS — Z743 Need for continuous supervision: Secondary | ICD-10-CM | POA: Diagnosis not present

## 2024-01-06 DIAGNOSIS — N2581 Secondary hyperparathyroidism of renal origin: Secondary | ICD-10-CM | POA: Diagnosis not present

## 2024-01-06 DIAGNOSIS — Z7401 Bed confinement status: Secondary | ICD-10-CM | POA: Diagnosis not present

## 2024-01-06 DIAGNOSIS — E1122 Type 2 diabetes mellitus with diabetic chronic kidney disease: Secondary | ICD-10-CM | POA: Diagnosis not present

## 2024-01-06 DIAGNOSIS — R569 Unspecified convulsions: Secondary | ICD-10-CM | POA: Diagnosis not present

## 2024-01-06 DIAGNOSIS — T82898A Other specified complication of vascular prosthetic devices, implants and grafts, initial encounter: Secondary | ICD-10-CM | POA: Diagnosis not present

## 2024-01-06 DIAGNOSIS — N186 End stage renal disease: Secondary | ICD-10-CM | POA: Diagnosis not present

## 2024-01-06 DIAGNOSIS — Z23 Encounter for immunization: Secondary | ICD-10-CM | POA: Diagnosis not present

## 2024-01-06 DIAGNOSIS — G40509 Epileptic seizures related to external causes, not intractable, without status epilepticus: Secondary | ICD-10-CM | POA: Diagnosis not present

## 2024-01-06 DIAGNOSIS — D631 Anemia in chronic kidney disease: Secondary | ICD-10-CM | POA: Diagnosis not present

## 2024-01-06 DIAGNOSIS — Z8673 Personal history of transient ischemic attack (TIA), and cerebral infarction without residual deficits: Secondary | ICD-10-CM | POA: Diagnosis not present

## 2024-01-06 DIAGNOSIS — R6889 Other general symptoms and signs: Secondary | ICD-10-CM | POA: Diagnosis not present

## 2024-01-06 DIAGNOSIS — D509 Iron deficiency anemia, unspecified: Secondary | ICD-10-CM | POA: Diagnosis not present

## 2024-01-06 DIAGNOSIS — I120 Hypertensive chronic kidney disease with stage 5 chronic kidney disease or end stage renal disease: Secondary | ICD-10-CM | POA: Diagnosis not present

## 2024-01-07 DIAGNOSIS — N186 End stage renal disease: Secondary | ICD-10-CM | POA: Diagnosis not present

## 2024-01-07 DIAGNOSIS — Z89521 Acquired absence of right knee: Secondary | ICD-10-CM | POA: Diagnosis not present

## 2024-01-07 DIAGNOSIS — E1165 Type 2 diabetes mellitus with hyperglycemia: Secondary | ICD-10-CM | POA: Diagnosis not present

## 2024-01-07 DIAGNOSIS — Z4781 Encounter for orthopedic aftercare following surgical amputation: Secondary | ICD-10-CM | POA: Diagnosis not present

## 2024-01-07 DIAGNOSIS — I120 Hypertensive chronic kidney disease with stage 5 chronic kidney disease or end stage renal disease: Secondary | ICD-10-CM | POA: Diagnosis not present

## 2024-01-07 DIAGNOSIS — I739 Peripheral vascular disease, unspecified: Secondary | ICD-10-CM | POA: Diagnosis not present

## 2024-01-08 DIAGNOSIS — N186 End stage renal disease: Secondary | ICD-10-CM | POA: Diagnosis not present

## 2024-01-08 DIAGNOSIS — Z23 Encounter for immunization: Secondary | ICD-10-CM | POA: Diagnosis not present

## 2024-01-08 DIAGNOSIS — Z992 Dependence on renal dialysis: Secondary | ICD-10-CM | POA: Diagnosis not present

## 2024-01-08 DIAGNOSIS — N2581 Secondary hyperparathyroidism of renal origin: Secondary | ICD-10-CM | POA: Diagnosis not present

## 2024-01-08 DIAGNOSIS — D631 Anemia in chronic kidney disease: Secondary | ICD-10-CM | POA: Diagnosis not present

## 2024-01-08 DIAGNOSIS — D509 Iron deficiency anemia, unspecified: Secondary | ICD-10-CM | POA: Diagnosis not present

## 2024-01-09 DIAGNOSIS — I120 Hypertensive chronic kidney disease with stage 5 chronic kidney disease or end stage renal disease: Secondary | ICD-10-CM | POA: Diagnosis not present

## 2024-01-09 DIAGNOSIS — E1165 Type 2 diabetes mellitus with hyperglycemia: Secondary | ICD-10-CM | POA: Diagnosis not present

## 2024-01-09 DIAGNOSIS — N186 End stage renal disease: Secondary | ICD-10-CM | POA: Diagnosis not present

## 2024-01-09 DIAGNOSIS — Z4781 Encounter for orthopedic aftercare following surgical amputation: Secondary | ICD-10-CM | POA: Diagnosis not present

## 2024-01-09 DIAGNOSIS — I739 Peripheral vascular disease, unspecified: Secondary | ICD-10-CM | POA: Diagnosis not present

## 2024-01-09 DIAGNOSIS — Z89521 Acquired absence of right knee: Secondary | ICD-10-CM | POA: Diagnosis not present

## 2024-01-10 DIAGNOSIS — N186 End stage renal disease: Secondary | ICD-10-CM | POA: Diagnosis not present

## 2024-01-10 DIAGNOSIS — Z23 Encounter for immunization: Secondary | ICD-10-CM | POA: Diagnosis not present

## 2024-01-10 DIAGNOSIS — D631 Anemia in chronic kidney disease: Secondary | ICD-10-CM | POA: Diagnosis not present

## 2024-01-10 DIAGNOSIS — N2581 Secondary hyperparathyroidism of renal origin: Secondary | ICD-10-CM | POA: Diagnosis not present

## 2024-01-10 DIAGNOSIS — Z992 Dependence on renal dialysis: Secondary | ICD-10-CM | POA: Diagnosis not present

## 2024-01-10 DIAGNOSIS — D509 Iron deficiency anemia, unspecified: Secondary | ICD-10-CM | POA: Diagnosis not present

## 2024-01-13 DIAGNOSIS — Z23 Encounter for immunization: Secondary | ICD-10-CM | POA: Diagnosis not present

## 2024-01-13 DIAGNOSIS — N186 End stage renal disease: Secondary | ICD-10-CM | POA: Diagnosis not present

## 2024-01-13 DIAGNOSIS — Z992 Dependence on renal dialysis: Secondary | ICD-10-CM | POA: Diagnosis not present

## 2024-01-13 DIAGNOSIS — N2581 Secondary hyperparathyroidism of renal origin: Secondary | ICD-10-CM | POA: Diagnosis not present

## 2024-01-13 DIAGNOSIS — D509 Iron deficiency anemia, unspecified: Secondary | ICD-10-CM | POA: Diagnosis not present

## 2024-01-13 DIAGNOSIS — D631 Anemia in chronic kidney disease: Secondary | ICD-10-CM | POA: Diagnosis not present

## 2024-01-15 DIAGNOSIS — D631 Anemia in chronic kidney disease: Secondary | ICD-10-CM | POA: Diagnosis not present

## 2024-01-15 DIAGNOSIS — D509 Iron deficiency anemia, unspecified: Secondary | ICD-10-CM | POA: Diagnosis not present

## 2024-01-15 DIAGNOSIS — Z23 Encounter for immunization: Secondary | ICD-10-CM | POA: Diagnosis not present

## 2024-01-15 DIAGNOSIS — I70221 Atherosclerosis of native arteries of extremities with rest pain, right leg: Secondary | ICD-10-CM | POA: Diagnosis not present

## 2024-01-15 DIAGNOSIS — N186 End stage renal disease: Secondary | ICD-10-CM | POA: Diagnosis not present

## 2024-01-15 DIAGNOSIS — Z992 Dependence on renal dialysis: Secondary | ICD-10-CM | POA: Diagnosis not present

## 2024-01-15 DIAGNOSIS — N2581 Secondary hyperparathyroidism of renal origin: Secondary | ICD-10-CM | POA: Diagnosis not present

## 2024-01-17 DIAGNOSIS — Z23 Encounter for immunization: Secondary | ICD-10-CM | POA: Diagnosis not present

## 2024-01-17 DIAGNOSIS — D631 Anemia in chronic kidney disease: Secondary | ICD-10-CM | POA: Diagnosis not present

## 2024-01-17 DIAGNOSIS — N2581 Secondary hyperparathyroidism of renal origin: Secondary | ICD-10-CM | POA: Diagnosis not present

## 2024-01-17 DIAGNOSIS — N186 End stage renal disease: Secondary | ICD-10-CM | POA: Diagnosis not present

## 2024-01-17 DIAGNOSIS — D509 Iron deficiency anemia, unspecified: Secondary | ICD-10-CM | POA: Diagnosis not present

## 2024-01-17 DIAGNOSIS — Z992 Dependence on renal dialysis: Secondary | ICD-10-CM | POA: Diagnosis not present

## 2024-01-20 DIAGNOSIS — D631 Anemia in chronic kidney disease: Secondary | ICD-10-CM | POA: Diagnosis not present

## 2024-01-20 DIAGNOSIS — D509 Iron deficiency anemia, unspecified: Secondary | ICD-10-CM | POA: Diagnosis not present

## 2024-01-20 DIAGNOSIS — Z992 Dependence on renal dialysis: Secondary | ICD-10-CM | POA: Diagnosis not present

## 2024-01-20 DIAGNOSIS — Z23 Encounter for immunization: Secondary | ICD-10-CM | POA: Diagnosis not present

## 2024-01-20 DIAGNOSIS — N186 End stage renal disease: Secondary | ICD-10-CM | POA: Diagnosis not present

## 2024-01-20 DIAGNOSIS — N2581 Secondary hyperparathyroidism of renal origin: Secondary | ICD-10-CM | POA: Diagnosis not present

## 2024-01-21 DIAGNOSIS — Z1331 Encounter for screening for depression: Secondary | ICD-10-CM | POA: Diagnosis not present

## 2024-01-21 DIAGNOSIS — I10 Essential (primary) hypertension: Secondary | ICD-10-CM | POA: Diagnosis not present

## 2024-01-21 DIAGNOSIS — E7800 Pure hypercholesterolemia, unspecified: Secondary | ICD-10-CM | POA: Diagnosis not present

## 2024-01-21 DIAGNOSIS — R5383 Other fatigue: Secondary | ICD-10-CM | POA: Diagnosis not present

## 2024-01-21 DIAGNOSIS — Z0000 Encounter for general adult medical examination without abnormal findings: Secondary | ICD-10-CM | POA: Diagnosis not present

## 2024-01-21 DIAGNOSIS — Z7189 Other specified counseling: Secondary | ICD-10-CM | POA: Diagnosis not present

## 2024-01-21 DIAGNOSIS — Z299 Encounter for prophylactic measures, unspecified: Secondary | ICD-10-CM | POA: Diagnosis not present

## 2024-01-21 DIAGNOSIS — Z1339 Encounter for screening examination for other mental health and behavioral disorders: Secondary | ICD-10-CM | POA: Diagnosis not present

## 2024-01-22 DIAGNOSIS — D509 Iron deficiency anemia, unspecified: Secondary | ICD-10-CM | POA: Diagnosis not present

## 2024-01-22 DIAGNOSIS — N2581 Secondary hyperparathyroidism of renal origin: Secondary | ICD-10-CM | POA: Diagnosis not present

## 2024-01-22 DIAGNOSIS — N186 End stage renal disease: Secondary | ICD-10-CM | POA: Diagnosis not present

## 2024-01-22 DIAGNOSIS — D631 Anemia in chronic kidney disease: Secondary | ICD-10-CM | POA: Diagnosis not present

## 2024-01-22 DIAGNOSIS — Z992 Dependence on renal dialysis: Secondary | ICD-10-CM | POA: Diagnosis not present

## 2024-01-22 DIAGNOSIS — Z23 Encounter for immunization: Secondary | ICD-10-CM | POA: Diagnosis not present

## 2024-01-23 DIAGNOSIS — I739 Peripheral vascular disease, unspecified: Secondary | ICD-10-CM | POA: Diagnosis not present

## 2024-01-23 DIAGNOSIS — L97512 Non-pressure chronic ulcer of other part of right foot with fat layer exposed: Secondary | ICD-10-CM | POA: Diagnosis not present

## 2024-01-23 DIAGNOSIS — E1140 Type 2 diabetes mellitus with diabetic neuropathy, unspecified: Secondary | ICD-10-CM | POA: Diagnosis not present

## 2024-01-24 DIAGNOSIS — N2581 Secondary hyperparathyroidism of renal origin: Secondary | ICD-10-CM | POA: Diagnosis not present

## 2024-01-24 DIAGNOSIS — Z992 Dependence on renal dialysis: Secondary | ICD-10-CM | POA: Diagnosis not present

## 2024-01-24 DIAGNOSIS — D631 Anemia in chronic kidney disease: Secondary | ICD-10-CM | POA: Diagnosis not present

## 2024-01-24 DIAGNOSIS — N186 End stage renal disease: Secondary | ICD-10-CM | POA: Diagnosis not present

## 2024-01-24 DIAGNOSIS — D509 Iron deficiency anemia, unspecified: Secondary | ICD-10-CM | POA: Diagnosis not present

## 2024-01-24 DIAGNOSIS — Z23 Encounter for immunization: Secondary | ICD-10-CM | POA: Diagnosis not present

## 2024-01-27 DIAGNOSIS — N186 End stage renal disease: Secondary | ICD-10-CM | POA: Diagnosis not present

## 2024-01-27 DIAGNOSIS — Z992 Dependence on renal dialysis: Secondary | ICD-10-CM | POA: Diagnosis not present

## 2024-01-27 DIAGNOSIS — D509 Iron deficiency anemia, unspecified: Secondary | ICD-10-CM | POA: Diagnosis not present

## 2024-01-27 DIAGNOSIS — Z23 Encounter for immunization: Secondary | ICD-10-CM | POA: Diagnosis not present

## 2024-01-27 DIAGNOSIS — N2581 Secondary hyperparathyroidism of renal origin: Secondary | ICD-10-CM | POA: Diagnosis not present

## 2024-01-27 DIAGNOSIS — D631 Anemia in chronic kidney disease: Secondary | ICD-10-CM | POA: Diagnosis not present

## 2024-01-29 DIAGNOSIS — Z23 Encounter for immunization: Secondary | ICD-10-CM | POA: Diagnosis not present

## 2024-01-29 DIAGNOSIS — D631 Anemia in chronic kidney disease: Secondary | ICD-10-CM | POA: Diagnosis not present

## 2024-01-29 DIAGNOSIS — Z992 Dependence on renal dialysis: Secondary | ICD-10-CM | POA: Diagnosis not present

## 2024-01-29 DIAGNOSIS — D509 Iron deficiency anemia, unspecified: Secondary | ICD-10-CM | POA: Diagnosis not present

## 2024-01-29 DIAGNOSIS — N186 End stage renal disease: Secondary | ICD-10-CM | POA: Diagnosis not present

## 2024-01-29 DIAGNOSIS — N2581 Secondary hyperparathyroidism of renal origin: Secondary | ICD-10-CM | POA: Diagnosis not present

## 2024-01-30 DIAGNOSIS — E1151 Type 2 diabetes mellitus with diabetic peripheral angiopathy without gangrene: Secondary | ICD-10-CM | POA: Diagnosis not present

## 2024-01-30 DIAGNOSIS — Z89512 Acquired absence of left leg below knee: Secondary | ICD-10-CM | POA: Diagnosis not present

## 2024-01-30 DIAGNOSIS — I120 Hypertensive chronic kidney disease with stage 5 chronic kidney disease or end stage renal disease: Secondary | ICD-10-CM | POA: Diagnosis not present

## 2024-01-30 DIAGNOSIS — Z794 Long term (current) use of insulin: Secondary | ICD-10-CM | POA: Diagnosis not present

## 2024-01-30 DIAGNOSIS — E1122 Type 2 diabetes mellitus with diabetic chronic kidney disease: Secondary | ICD-10-CM | POA: Diagnosis not present

## 2024-01-30 DIAGNOSIS — I70221 Atherosclerosis of native arteries of extremities with rest pain, right leg: Secondary | ICD-10-CM | POA: Diagnosis not present

## 2024-01-30 DIAGNOSIS — Z992 Dependence on renal dialysis: Secondary | ICD-10-CM | POA: Diagnosis not present

## 2024-01-30 DIAGNOSIS — E785 Hyperlipidemia, unspecified: Secondary | ICD-10-CM | POA: Diagnosis not present

## 2024-01-30 DIAGNOSIS — I6930 Unspecified sequelae of cerebral infarction: Secondary | ICD-10-CM | POA: Diagnosis not present

## 2024-01-30 DIAGNOSIS — I7092 Chronic total occlusion of artery of the extremities: Secondary | ICD-10-CM | POA: Diagnosis not present

## 2024-01-30 DIAGNOSIS — I2510 Atherosclerotic heart disease of native coronary artery without angina pectoris: Secondary | ICD-10-CM | POA: Diagnosis not present

## 2024-01-30 DIAGNOSIS — Z79899 Other long term (current) drug therapy: Secondary | ICD-10-CM | POA: Diagnosis not present

## 2024-01-30 DIAGNOSIS — Z7902 Long term (current) use of antithrombotics/antiplatelets: Secondary | ICD-10-CM | POA: Diagnosis not present

## 2024-01-30 DIAGNOSIS — N186 End stage renal disease: Secondary | ICD-10-CM | POA: Diagnosis not present

## 2024-01-31 DIAGNOSIS — N2581 Secondary hyperparathyroidism of renal origin: Secondary | ICD-10-CM | POA: Diagnosis not present

## 2024-01-31 DIAGNOSIS — D509 Iron deficiency anemia, unspecified: Secondary | ICD-10-CM | POA: Diagnosis not present

## 2024-01-31 DIAGNOSIS — D631 Anemia in chronic kidney disease: Secondary | ICD-10-CM | POA: Diagnosis not present

## 2024-01-31 DIAGNOSIS — Z992 Dependence on renal dialysis: Secondary | ICD-10-CM | POA: Diagnosis not present

## 2024-01-31 DIAGNOSIS — Z23 Encounter for immunization: Secondary | ICD-10-CM | POA: Diagnosis not present

## 2024-01-31 DIAGNOSIS — N186 End stage renal disease: Secondary | ICD-10-CM | POA: Diagnosis not present

## 2024-02-03 DIAGNOSIS — N186 End stage renal disease: Secondary | ICD-10-CM | POA: Diagnosis not present

## 2024-02-03 DIAGNOSIS — D509 Iron deficiency anemia, unspecified: Secondary | ICD-10-CM | POA: Diagnosis not present

## 2024-02-03 DIAGNOSIS — D631 Anemia in chronic kidney disease: Secondary | ICD-10-CM | POA: Diagnosis not present

## 2024-02-03 DIAGNOSIS — Z992 Dependence on renal dialysis: Secondary | ICD-10-CM | POA: Diagnosis not present

## 2024-02-03 DIAGNOSIS — N2581 Secondary hyperparathyroidism of renal origin: Secondary | ICD-10-CM | POA: Diagnosis not present

## 2024-02-05 DIAGNOSIS — N186 End stage renal disease: Secondary | ICD-10-CM | POA: Diagnosis not present

## 2024-02-05 DIAGNOSIS — D509 Iron deficiency anemia, unspecified: Secondary | ICD-10-CM | POA: Diagnosis not present

## 2024-02-05 DIAGNOSIS — D631 Anemia in chronic kidney disease: Secondary | ICD-10-CM | POA: Diagnosis not present

## 2024-02-05 DIAGNOSIS — N2581 Secondary hyperparathyroidism of renal origin: Secondary | ICD-10-CM | POA: Diagnosis not present

## 2024-02-05 DIAGNOSIS — Z992 Dependence on renal dialysis: Secondary | ICD-10-CM | POA: Diagnosis not present

## 2024-02-06 DIAGNOSIS — I10 Essential (primary) hypertension: Secondary | ICD-10-CM | POA: Diagnosis not present

## 2024-02-06 DIAGNOSIS — E785 Hyperlipidemia, unspecified: Secondary | ICD-10-CM | POA: Diagnosis not present

## 2024-02-06 DIAGNOSIS — I639 Cerebral infarction, unspecified: Secondary | ICD-10-CM | POA: Diagnosis not present

## 2024-02-06 DIAGNOSIS — Z89512 Acquired absence of left leg below knee: Secondary | ICD-10-CM | POA: Diagnosis not present

## 2024-02-06 DIAGNOSIS — N186 End stage renal disease: Secondary | ICD-10-CM | POA: Diagnosis not present

## 2024-02-06 DIAGNOSIS — G40909 Epilepsy, unspecified, not intractable, without status epilepticus: Secondary | ICD-10-CM | POA: Diagnosis not present

## 2024-02-07 DIAGNOSIS — D631 Anemia in chronic kidney disease: Secondary | ICD-10-CM | POA: Diagnosis not present

## 2024-02-07 DIAGNOSIS — N2581 Secondary hyperparathyroidism of renal origin: Secondary | ICD-10-CM | POA: Diagnosis not present

## 2024-02-07 DIAGNOSIS — Z992 Dependence on renal dialysis: Secondary | ICD-10-CM | POA: Diagnosis not present

## 2024-02-07 DIAGNOSIS — N186 End stage renal disease: Secondary | ICD-10-CM | POA: Diagnosis not present

## 2024-02-07 DIAGNOSIS — D509 Iron deficiency anemia, unspecified: Secondary | ICD-10-CM | POA: Diagnosis not present

## 2024-02-10 DIAGNOSIS — D631 Anemia in chronic kidney disease: Secondary | ICD-10-CM | POA: Diagnosis not present

## 2024-02-10 DIAGNOSIS — Z992 Dependence on renal dialysis: Secondary | ICD-10-CM | POA: Diagnosis not present

## 2024-02-10 DIAGNOSIS — N186 End stage renal disease: Secondary | ICD-10-CM | POA: Diagnosis not present

## 2024-02-10 DIAGNOSIS — N2581 Secondary hyperparathyroidism of renal origin: Secondary | ICD-10-CM | POA: Diagnosis not present

## 2024-02-10 DIAGNOSIS — D509 Iron deficiency anemia, unspecified: Secondary | ICD-10-CM | POA: Diagnosis not present

## 2024-02-12 DIAGNOSIS — D631 Anemia in chronic kidney disease: Secondary | ICD-10-CM | POA: Diagnosis not present

## 2024-02-12 DIAGNOSIS — N2581 Secondary hyperparathyroidism of renal origin: Secondary | ICD-10-CM | POA: Diagnosis not present

## 2024-02-12 DIAGNOSIS — N186 End stage renal disease: Secondary | ICD-10-CM | POA: Diagnosis not present

## 2024-02-12 DIAGNOSIS — D509 Iron deficiency anemia, unspecified: Secondary | ICD-10-CM | POA: Diagnosis not present

## 2024-02-12 DIAGNOSIS — Z992 Dependence on renal dialysis: Secondary | ICD-10-CM | POA: Diagnosis not present

## 2024-02-13 DIAGNOSIS — Z992 Dependence on renal dialysis: Secondary | ICD-10-CM | POA: Diagnosis not present

## 2024-02-13 DIAGNOSIS — L97512 Non-pressure chronic ulcer of other part of right foot with fat layer exposed: Secondary | ICD-10-CM | POA: Diagnosis not present

## 2024-02-13 DIAGNOSIS — L97514 Non-pressure chronic ulcer of other part of right foot with necrosis of bone: Secondary | ICD-10-CM | POA: Diagnosis not present

## 2024-02-14 DIAGNOSIS — Z992 Dependence on renal dialysis: Secondary | ICD-10-CM | POA: Diagnosis not present

## 2024-02-14 DIAGNOSIS — N186 End stage renal disease: Secondary | ICD-10-CM | POA: Diagnosis not present

## 2024-02-14 DIAGNOSIS — D509 Iron deficiency anemia, unspecified: Secondary | ICD-10-CM | POA: Diagnosis not present

## 2024-02-14 DIAGNOSIS — D631 Anemia in chronic kidney disease: Secondary | ICD-10-CM | POA: Diagnosis not present

## 2024-02-14 DIAGNOSIS — N2581 Secondary hyperparathyroidism of renal origin: Secondary | ICD-10-CM | POA: Diagnosis not present

## 2024-02-17 DIAGNOSIS — N2581 Secondary hyperparathyroidism of renal origin: Secondary | ICD-10-CM | POA: Diagnosis not present

## 2024-02-17 DIAGNOSIS — N186 End stage renal disease: Secondary | ICD-10-CM | POA: Diagnosis not present

## 2024-02-17 DIAGNOSIS — D509 Iron deficiency anemia, unspecified: Secondary | ICD-10-CM | POA: Diagnosis not present

## 2024-02-17 DIAGNOSIS — D631 Anemia in chronic kidney disease: Secondary | ICD-10-CM | POA: Diagnosis not present

## 2024-02-17 DIAGNOSIS — Z992 Dependence on renal dialysis: Secondary | ICD-10-CM | POA: Diagnosis not present

## 2024-02-19 DIAGNOSIS — N2581 Secondary hyperparathyroidism of renal origin: Secondary | ICD-10-CM | POA: Diagnosis not present

## 2024-02-19 DIAGNOSIS — N186 End stage renal disease: Secondary | ICD-10-CM | POA: Diagnosis not present

## 2024-02-19 DIAGNOSIS — D509 Iron deficiency anemia, unspecified: Secondary | ICD-10-CM | POA: Diagnosis not present

## 2024-02-19 DIAGNOSIS — Z992 Dependence on renal dialysis: Secondary | ICD-10-CM | POA: Diagnosis not present

## 2024-02-19 DIAGNOSIS — D631 Anemia in chronic kidney disease: Secondary | ICD-10-CM | POA: Diagnosis not present

## 2024-02-20 DIAGNOSIS — G40909 Epilepsy, unspecified, not intractable, without status epilepticus: Secondary | ICD-10-CM | POA: Diagnosis not present

## 2024-02-21 DIAGNOSIS — D509 Iron deficiency anemia, unspecified: Secondary | ICD-10-CM | POA: Diagnosis not present

## 2024-02-21 DIAGNOSIS — D631 Anemia in chronic kidney disease: Secondary | ICD-10-CM | POA: Diagnosis not present

## 2024-02-21 DIAGNOSIS — N2581 Secondary hyperparathyroidism of renal origin: Secondary | ICD-10-CM | POA: Diagnosis not present

## 2024-02-21 DIAGNOSIS — Z992 Dependence on renal dialysis: Secondary | ICD-10-CM | POA: Diagnosis not present

## 2024-02-21 DIAGNOSIS — N186 End stage renal disease: Secondary | ICD-10-CM | POA: Diagnosis not present

## 2024-02-24 DIAGNOSIS — N2581 Secondary hyperparathyroidism of renal origin: Secondary | ICD-10-CM | POA: Diagnosis not present

## 2024-02-24 DIAGNOSIS — D509 Iron deficiency anemia, unspecified: Secondary | ICD-10-CM | POA: Diagnosis not present

## 2024-02-24 DIAGNOSIS — Z992 Dependence on renal dialysis: Secondary | ICD-10-CM | POA: Diagnosis not present

## 2024-02-24 DIAGNOSIS — D631 Anemia in chronic kidney disease: Secondary | ICD-10-CM | POA: Diagnosis not present

## 2024-02-24 DIAGNOSIS — N186 End stage renal disease: Secondary | ICD-10-CM | POA: Diagnosis not present

## 2024-02-26 DIAGNOSIS — N2581 Secondary hyperparathyroidism of renal origin: Secondary | ICD-10-CM | POA: Diagnosis not present

## 2024-02-26 DIAGNOSIS — D509 Iron deficiency anemia, unspecified: Secondary | ICD-10-CM | POA: Diagnosis not present

## 2024-02-26 DIAGNOSIS — N186 End stage renal disease: Secondary | ICD-10-CM | POA: Diagnosis not present

## 2024-02-26 DIAGNOSIS — Z992 Dependence on renal dialysis: Secondary | ICD-10-CM | POA: Diagnosis not present

## 2024-02-26 DIAGNOSIS — D631 Anemia in chronic kidney disease: Secondary | ICD-10-CM | POA: Diagnosis not present

## 2024-02-28 DIAGNOSIS — N2581 Secondary hyperparathyroidism of renal origin: Secondary | ICD-10-CM | POA: Diagnosis not present

## 2024-02-28 DIAGNOSIS — N186 End stage renal disease: Secondary | ICD-10-CM | POA: Diagnosis not present

## 2024-02-28 DIAGNOSIS — D509 Iron deficiency anemia, unspecified: Secondary | ICD-10-CM | POA: Diagnosis not present

## 2024-02-28 DIAGNOSIS — Z992 Dependence on renal dialysis: Secondary | ICD-10-CM | POA: Diagnosis not present

## 2024-02-28 DIAGNOSIS — D631 Anemia in chronic kidney disease: Secondary | ICD-10-CM | POA: Diagnosis not present

## 2024-03-01 DIAGNOSIS — Z992 Dependence on renal dialysis: Secondary | ICD-10-CM | POA: Diagnosis not present

## 2024-03-01 DIAGNOSIS — N186 End stage renal disease: Secondary | ICD-10-CM | POA: Diagnosis not present

## 2024-03-03 DIAGNOSIS — E119 Type 2 diabetes mellitus without complications: Secondary | ICD-10-CM | POA: Diagnosis not present

## 2024-03-03 DIAGNOSIS — Z89512 Acquired absence of left leg below knee: Secondary | ICD-10-CM | POA: Diagnosis not present

## 2024-03-03 DIAGNOSIS — Z299 Encounter for prophylactic measures, unspecified: Secondary | ICD-10-CM | POA: Diagnosis not present

## 2024-03-03 DIAGNOSIS — Z992 Dependence on renal dialysis: Secondary | ICD-10-CM | POA: Diagnosis not present

## 2024-03-03 DIAGNOSIS — N186 End stage renal disease: Secondary | ICD-10-CM | POA: Diagnosis not present

## 2024-03-03 DIAGNOSIS — I10 Essential (primary) hypertension: Secondary | ICD-10-CM | POA: Diagnosis not present

## 2024-03-05 DIAGNOSIS — L97512 Non-pressure chronic ulcer of other part of right foot with fat layer exposed: Secondary | ICD-10-CM | POA: Diagnosis not present

## 2024-03-05 DIAGNOSIS — E1140 Type 2 diabetes mellitus with diabetic neuropathy, unspecified: Secondary | ICD-10-CM | POA: Diagnosis not present

## 2024-03-05 DIAGNOSIS — I739 Peripheral vascular disease, unspecified: Secondary | ICD-10-CM | POA: Diagnosis not present

## 2024-03-05 DIAGNOSIS — Z992 Dependence on renal dialysis: Secondary | ICD-10-CM | POA: Diagnosis not present

## 2024-03-12 DIAGNOSIS — E113211 Type 2 diabetes mellitus with mild nonproliferative diabetic retinopathy with macular edema, right eye: Secondary | ICD-10-CM | POA: Diagnosis not present

## 2024-03-12 DIAGNOSIS — I739 Peripheral vascular disease, unspecified: Secondary | ICD-10-CM | POA: Diagnosis not present

## 2024-03-17 DIAGNOSIS — G40909 Epilepsy, unspecified, not intractable, without status epilepticus: Secondary | ICD-10-CM | POA: Diagnosis not present

## 2024-03-17 DIAGNOSIS — I10 Essential (primary) hypertension: Secondary | ICD-10-CM | POA: Diagnosis not present

## 2024-03-17 DIAGNOSIS — Z89512 Acquired absence of left leg below knee: Secondary | ICD-10-CM | POA: Diagnosis not present

## 2024-03-17 DIAGNOSIS — N186 End stage renal disease: Secondary | ICD-10-CM | POA: Diagnosis not present

## 2024-03-17 DIAGNOSIS — E785 Hyperlipidemia, unspecified: Secondary | ICD-10-CM | POA: Diagnosis not present

## 2024-03-17 DIAGNOSIS — I639 Cerebral infarction, unspecified: Secondary | ICD-10-CM | POA: Diagnosis not present
